# Patient Record
Sex: Male | Born: 1940 | Race: White | Hispanic: No | Marital: Married | Smoking: Never smoker
Health system: Southern US, Community
[De-identification: ages and names within clinical notes are randomized; demographics above are authoritative.]

## PROBLEM LIST (undated history)

## (undated) DIAGNOSIS — K219 Gastro-esophageal reflux disease without esophagitis: Secondary | ICD-10-CM

## (undated) DIAGNOSIS — M779 Enthesopathy, unspecified: Secondary | ICD-10-CM

## (undated) DIAGNOSIS — M1990 Unspecified osteoarthritis, unspecified site: Secondary | ICD-10-CM

## (undated) DIAGNOSIS — J90 Pleural effusion, not elsewhere classified: Secondary | ICD-10-CM

## (undated) DIAGNOSIS — I4892 Unspecified atrial flutter: Secondary | ICD-10-CM

## (undated) DIAGNOSIS — T8859XA Other complications of anesthesia, initial encounter: Secondary | ICD-10-CM

## (undated) DIAGNOSIS — C801 Malignant (primary) neoplasm, unspecified: Secondary | ICD-10-CM

## (undated) DIAGNOSIS — Z8679 Personal history of other diseases of the circulatory system: Secondary | ICD-10-CM

## (undated) DIAGNOSIS — C4490 Unspecified malignant neoplasm of skin, unspecified: Secondary | ICD-10-CM

## (undated) DIAGNOSIS — K5790 Diverticulosis of intestine, part unspecified, without perforation or abscess without bleeding: Secondary | ICD-10-CM

## (undated) DIAGNOSIS — I214 Non-ST elevation (NSTEMI) myocardial infarction: Secondary | ICD-10-CM

## (undated) DIAGNOSIS — I255 Ischemic cardiomyopathy: Secondary | ICD-10-CM

## (undated) DIAGNOSIS — K589 Irritable bowel syndrome without diarrhea: Secondary | ICD-10-CM

## (undated) DIAGNOSIS — K869 Disease of pancreas, unspecified: Secondary | ICD-10-CM

## (undated) DIAGNOSIS — Z789 Other specified health status: Secondary | ICD-10-CM

## (undated) DIAGNOSIS — E785 Hyperlipidemia, unspecified: Secondary | ICD-10-CM

## (undated) DIAGNOSIS — Z951 Presence of aortocoronary bypass graft: Secondary | ICD-10-CM

## (undated) DIAGNOSIS — N5089 Other specified disorders of the male genital organs: Secondary | ICD-10-CM

## (undated) DIAGNOSIS — I10 Essential (primary) hypertension: Secondary | ICD-10-CM

## (undated) DIAGNOSIS — I5020 Unspecified systolic (congestive) heart failure: Secondary | ICD-10-CM

## (undated) DIAGNOSIS — E871 Hypo-osmolality and hyponatremia: Secondary | ICD-10-CM

## (undated) DIAGNOSIS — I2510 Atherosclerotic heart disease of native coronary artery without angina pectoris: Secondary | ICD-10-CM

## (undated) HISTORY — PX: BACK SURGERY: SHX140

## (undated) HISTORY — DX: Hyperlipidemia, unspecified: E78.5

## (undated) HISTORY — DX: Enthesopathy, unspecified: M77.9

## (undated) HISTORY — PX: COLONOSCOPY: SHX174

## (undated) HISTORY — PX: HYDROCELE EXCISION: SHX482

## (undated) HISTORY — PX: SKIN CANCER EXCISION: SHX779

## (undated) HISTORY — PX: CATARACT EXTRACTION, BILATERAL: SHX1313

## (undated) HISTORY — DX: Essential (primary) hypertension: I10

## (undated) HISTORY — DX: Atherosclerotic heart disease of native coronary artery without angina pectoris: I25.10

## (undated) HISTORY — DX: Unspecified osteoarthritis, unspecified site: M19.90

## (undated) HISTORY — DX: Irritable bowel syndrome, unspecified: K58.9

## (undated) HISTORY — PX: BUNIONECTOMY: SHX129

## (undated) HISTORY — DX: Ischemic cardiomyopathy: I25.5

## (undated) MED FILL — Dexamethasone Sodium Phosphate Inj 100 MG/10ML: INTRAMUSCULAR | Qty: 1.5 | Status: AC

---

## 2002-02-16 ENCOUNTER — Encounter: Payer: Self-pay | Admitting: Internal Medicine

## 2002-12-21 HISTORY — PX: HEMORRHOID SURGERY: SHX153

## 2003-11-19 ENCOUNTER — Encounter: Payer: Self-pay | Admitting: Internal Medicine

## 2004-12-31 ENCOUNTER — Ambulatory Visit: Payer: Self-pay | Admitting: Internal Medicine

## 2005-05-07 ENCOUNTER — Ambulatory Visit: Payer: Self-pay | Admitting: Internal Medicine

## 2005-08-06 ENCOUNTER — Ambulatory Visit: Payer: Self-pay | Admitting: Internal Medicine

## 2006-02-04 ENCOUNTER — Ambulatory Visit: Payer: Self-pay | Admitting: Internal Medicine

## 2006-05-08 ENCOUNTER — Emergency Department: Payer: Self-pay | Admitting: Emergency Medicine

## 2006-08-02 ENCOUNTER — Ambulatory Visit: Payer: Self-pay | Admitting: Internal Medicine

## 2007-03-03 ENCOUNTER — Encounter: Payer: Self-pay | Admitting: Internal Medicine

## 2007-03-03 ENCOUNTER — Ambulatory Visit: Payer: Self-pay | Admitting: Internal Medicine

## 2007-03-03 DIAGNOSIS — I10 Essential (primary) hypertension: Secondary | ICD-10-CM | POA: Insufficient documentation

## 2007-03-03 DIAGNOSIS — E785 Hyperlipidemia, unspecified: Secondary | ICD-10-CM

## 2007-03-03 DIAGNOSIS — B001 Herpesviral vesicular dermatitis: Secondary | ICD-10-CM

## 2007-03-03 DIAGNOSIS — M1990 Unspecified osteoarthritis, unspecified site: Secondary | ICD-10-CM | POA: Insufficient documentation

## 2007-03-03 LAB — CONVERTED CEMR LAB
Calcium: 9 mg/dL (ref 8.4–10.5)
Chloride: 107 meq/L (ref 96–112)
GFR calc Af Amer: 125 mL/min
GFR calc non Af Amer: 103 mL/min
Glucose, Bld: 116 mg/dL — ABNORMAL HIGH (ref 70–99)
PSA: 0.36 ng/mL (ref 0.10–4.00)

## 2007-06-27 ENCOUNTER — Ambulatory Visit: Payer: Self-pay | Admitting: Internal Medicine

## 2007-06-27 DIAGNOSIS — N410 Acute prostatitis: Secondary | ICD-10-CM | POA: Insufficient documentation

## 2007-06-27 LAB — CONVERTED CEMR LAB
Glucose, Urine, Semiquant: NEGATIVE
Specific Gravity, Urine: <1.005
Urobilinogen, UA: 0.2
pH: 6

## 2007-08-25 ENCOUNTER — Ambulatory Visit: Payer: Self-pay | Admitting: Internal Medicine

## 2008-01-19 ENCOUNTER — Telehealth (INDEPENDENT_AMBULATORY_CARE_PROVIDER_SITE_OTHER): Payer: Self-pay | Admitting: *Deleted

## 2008-02-23 ENCOUNTER — Ambulatory Visit: Payer: Self-pay | Admitting: Internal Medicine

## 2008-02-28 LAB — CONVERTED CEMR LAB
BUN: 10 mg/dL (ref 6–23)
Basophils Relative: 0.3 % (ref 0.0–1.0)
CO2: 30 meq/L (ref 19–32)
Chloride: 104 meq/L (ref 96–112)
Creatinine, Ser: 0.8 mg/dL (ref 0.4–1.5)
Eosinophils Relative: 2.2 % (ref 0.0–5.0)
Hemoglobin: 14.8 g/dL (ref 13.0–17.0)
Lymphocytes Relative: 35 % (ref 12.0–46.0)
Monocytes Absolute: 0.4 10*3/uL (ref 0.2–0.7)
Neutro Abs: 2.8 10*3/uL (ref 1.4–7.7)
PSA: 0.41 ng/mL (ref 0.10–4.00)
Phosphorus: 3 mg/dL (ref 2.3–4.6)
RDW: 12.4 % (ref 11.5–14.6)
Sodium: 140 meq/L (ref 135–145)
TSH: 1.91 microintl units/mL (ref 0.35–5.50)
VLDL: 29 mg/dL (ref 0–40)
WBC: 5.1 10*3/uL (ref 4.5–10.5)

## 2008-03-27 ENCOUNTER — Telehealth (INDEPENDENT_AMBULATORY_CARE_PROVIDER_SITE_OTHER): Payer: Self-pay | Admitting: *Deleted

## 2008-09-14 ENCOUNTER — Ambulatory Visit: Payer: Self-pay | Admitting: Internal Medicine

## 2008-09-17 LAB — CONVERTED CEMR LAB
AST: 26 units/L (ref 0–37)
Albumin: 4.3 g/dL (ref 3.5–5.2)
Alkaline Phosphatase: 80 units/L (ref 39–117)
CO2: 29 meq/L (ref 19–32)
Calcium: 9 mg/dL (ref 8.4–10.5)
Cholesterol: 212 mg/dL (ref 0–200)
Creatinine, Ser: 0.8 mg/dL (ref 0.4–1.5)
Direct LDL: 159.3 mg/dL
GFR calc Af Amer: 124 mL/min
GFR calc non Af Amer: 103 mL/min
Phosphorus: 2.6 mg/dL (ref 2.3–4.6)
Sodium: 142 meq/L (ref 135–145)
Total CHOL/HDL Ratio: 7
Total Protein: 7.1 g/dL (ref 6.0–8.3)
Triglycerides: 95 mg/dL (ref 0–149)

## 2008-09-26 ENCOUNTER — Encounter: Payer: Self-pay | Admitting: Internal Medicine

## 2008-12-21 DIAGNOSIS — M779 Enthesopathy, unspecified: Secondary | ICD-10-CM

## 2008-12-21 HISTORY — DX: Enthesopathy, unspecified: M77.9

## 2009-02-18 ENCOUNTER — Encounter: Payer: Self-pay | Admitting: Gastroenterology

## 2009-02-18 ENCOUNTER — Encounter: Payer: Self-pay | Admitting: Internal Medicine

## 2009-02-18 ENCOUNTER — Encounter: Payer: Self-pay | Admitting: Family Medicine

## 2009-02-26 ENCOUNTER — Ambulatory Visit: Payer: Self-pay | Admitting: Family Medicine

## 2009-02-26 LAB — CONVERTED CEMR LAB
Bilirubin Urine: NEGATIVE
Blood in Urine, dipstick: NEGATIVE
Ketones, urine, test strip: NEGATIVE
Nitrite: NEGATIVE
Protein, U semiquant: NEGATIVE
Urobilinogen, UA: 0.2
WBC Urine, dipstick: NEGATIVE

## 2009-03-04 ENCOUNTER — Telehealth: Payer: Self-pay | Admitting: Family Medicine

## 2009-03-05 ENCOUNTER — Ambulatory Visit: Payer: Self-pay | Admitting: Internal Medicine

## 2009-03-06 LAB — CONVERTED CEMR LAB
ALT: 26 units/L (ref 0–53)
AST: 19 units/L (ref 0–37)
Basophils Relative: 0.5 % (ref 0.0–3.0)
Bilirubin, Direct: 0.1 mg/dL (ref 0.0–0.3)
Eosinophils Relative: 1.5 % (ref 0.0–5.0)
Glucose, Bld: 91 mg/dL (ref 70–99)
HCT: 42.8 % (ref 39.0–52.0)
Hemoglobin: 14.7 g/dL (ref 13.0–17.0)
Lipase: 23 units/L (ref 11.0–59.0)
Lymphs Abs: 1.9 10*3/uL (ref 0.7–4.0)
MCV: 90.8 fL (ref 78.0–100.0)
Monocytes Absolute: 0.6 10*3/uL (ref 0.1–1.0)
Monocytes Relative: 10.1 % (ref 3.0–12.0)
Platelets: 216 10*3/uL (ref 150.0–400.0)
RBC: 4.72 M/uL (ref 4.22–5.81)
Sodium: 140 meq/L (ref 135–145)
Total Bilirubin: 1.9 mg/dL — ABNORMAL HIGH (ref 0.3–1.2)
Total Protein: 7 g/dL (ref 6.0–8.3)
WBC: 5.8 10*3/uL (ref 4.5–10.5)

## 2009-03-07 ENCOUNTER — Ambulatory Visit: Payer: Self-pay | Admitting: Internal Medicine

## 2009-03-07 ENCOUNTER — Telehealth: Payer: Self-pay | Admitting: Internal Medicine

## 2009-03-08 ENCOUNTER — Telehealth: Payer: Self-pay | Admitting: Gastroenterology

## 2009-03-08 ENCOUNTER — Encounter: Payer: Self-pay | Admitting: Internal Medicine

## 2009-03-08 ENCOUNTER — Encounter: Payer: Self-pay | Admitting: Gastroenterology

## 2009-03-08 ENCOUNTER — Telehealth: Payer: Self-pay | Admitting: Internal Medicine

## 2009-03-08 ENCOUNTER — Ambulatory Visit: Payer: Self-pay | Admitting: Internal Medicine

## 2009-03-12 ENCOUNTER — Ambulatory Visit: Payer: Self-pay | Admitting: Gastroenterology

## 2009-04-17 ENCOUNTER — Ambulatory Visit: Payer: Self-pay | Admitting: Gastroenterology

## 2009-12-06 ENCOUNTER — Ambulatory Visit: Payer: Self-pay | Admitting: Gastroenterology

## 2009-12-06 DIAGNOSIS — K7689 Other specified diseases of liver: Secondary | ICD-10-CM

## 2009-12-06 DIAGNOSIS — K5730 Diverticulosis of large intestine without perforation or abscess without bleeding: Secondary | ICD-10-CM | POA: Insufficient documentation

## 2010-01-21 HISTORY — PX: INGUINAL HERNIA REPAIR: SHX194

## 2010-01-22 ENCOUNTER — Encounter: Payer: Self-pay | Admitting: Internal Medicine

## 2010-01-28 ENCOUNTER — Telehealth: Payer: Self-pay | Admitting: Internal Medicine

## 2010-01-30 ENCOUNTER — Ambulatory Visit (HOSPITAL_COMMUNITY): Admission: RE | Admit: 2010-01-30 | Discharge: 2010-01-30 | Payer: Self-pay | Admitting: Surgery

## 2010-06-11 ENCOUNTER — Encounter: Payer: Self-pay | Admitting: Internal Medicine

## 2010-06-20 HISTORY — PX: ROTATOR CUFF REPAIR: SHX139

## 2010-07-11 ENCOUNTER — Ambulatory Visit: Payer: Self-pay | Admitting: Internal Medicine

## 2010-07-11 ENCOUNTER — Telehealth: Payer: Self-pay | Admitting: Internal Medicine

## 2010-07-11 DIAGNOSIS — K589 Irritable bowel syndrome without diarrhea: Secondary | ICD-10-CM

## 2010-07-14 LAB — CONVERTED CEMR LAB
ALT: 21 units/L (ref 0–53)
AST: 19 units/L (ref 0–37)
Alkaline Phosphatase: 141 units/L — ABNORMAL HIGH (ref 39–117)
Basophils Absolute: 0 10*3/uL (ref 0.0–0.1)
Eosinophils Absolute: 0.1 10*3/uL (ref 0.0–0.7)
Eosinophils Relative: 1 % (ref 0–5)
HCT: 43.1 % (ref 39.0–52.0)
MCV: 86.5 fL (ref 78.0–100.0)
Neutrophils Relative %: 62 % (ref 43–77)
Platelets: 273 10*3/uL (ref 150–400)
Potassium: 4.4 meq/L (ref 3.5–5.3)
RDW: 12.9 % (ref 11.5–15.5)
Sodium: 138 meq/L (ref 135–145)
Total Bilirubin: 1 mg/dL (ref 0.3–1.2)
Total Protein: 7.9 g/dL (ref 6.0–8.3)

## 2010-12-10 ENCOUNTER — Telehealth: Payer: Self-pay | Admitting: Internal Medicine

## 2011-01-20 NOTE — Progress Notes (Signed)
Summary: requests refills on valtrex  Phone Note Refill Request Message from:  Pharmacy  Refills Requested: Medication #1:  VALACYCLOVIR HCL 1 GM TABS take 2 at onset of rash and repeat in 12 hours Pt is requesting refills, please [facility].  Initial call taken by: Lowella Petties CMA,  July 11, 2010 4:31 PM  Follow-up for Phone Call        okay #20 x 1 Follow-up by: Cindee Salt MD,  July 11, 2010 4:33 PM    Prescriptions: VALACYCLOVIR HCL 1 GM TABS (VALACYCLOVIR HCL) take 2 at onset of rash and repeat in 12 hours  #20 x 1   Entered by:   Janee Morn CMA   Authorized by:   Cindee Salt MD   Signed by:   Janee Morn CMA on 07/11/2010   Method used:   Electronically to        [facility] [address] [address]* (retail)       [address]       [place], [place]  [address]       Ph: [phone]       Fax: [phone]   RxID:   [id number]   Appended Document: requests refills on valtrex Faxed to [facility] in [place] as directed.

## 2011-01-20 NOTE — Letter (Signed)
Summary: Screening/[facility]   Imported By: Sherian Rein 09/06/2010 08:55:12  _____________________________________________________________________  External Attachment:    Type:   Image     Comment:   External Document

## 2011-01-20 NOTE — Progress Notes (Signed)
Summary: [name]  Phone Note Refill Request Message from:  [facility] [phone] on January 28, 2010 12:58 PM  Refills Requested: Medication #1:  NORVASC 5 MG  TABS Take 1/2 by mouth once a day or as directed   Last Refilled: 08/03/2009 pt would also like VALTREX 1 GM TABS (VALACYCLOVIR HCL) Take 2 tablet by mouth twice a day, not on active med list. Patient does not have a follow-up appt scheduled.   Method Requested: Electronic Initial call taken by: Mervin Hack CMA Duncan Dull),  January 28, 2010 1:01 PM  Follow-up for Phone Call        okay norvasc x 1 year  had valtrex in past Okay 1000mg    #28 x 0 2 at onset of rash and repeat in 12 hours  set up appt within the next 2-3 months for follow up Follow-up by: Cindee Salt MD,  January 28, 2010 1:45 PM  Additional Follow-up for Phone Call Additional follow up Details #1::        patient states he doesn't have money to keep going to doctors and he is NOT coming in. Patient was screaming, I told him we can't keep refilling medications if he doesn't come for his appst. Patient screamed again that he will just stop taking all medications and hung up the phone. Please advise DeShannon Katrinka Blazing CMA Duncan Dull)  January 28, 2010 2:53 PM    Please correct the Rx 3 months only on the norvasc keep the valtrex the same Additional Follow-up by: Cindee Salt MD,  January 28, 2010 3:01 PM    Additional Follow-up for Phone Call Additional follow up Details #2::    new [facility] to [facility]  Follow-up by: Mervin Hack CMA Duncan Dull),  January 28, 2010 3:09 PM  New/Updated Medications: VALACYCLOVIR HCL 1 GM TABS (VALACYCLOVIR HCL) take 2 at onset of rash and repeat in 12 hours Prescriptions: NORVASC 5 MG  TABS (AMLODIPINE BESYLATE) Take 1/2 by mouth once a day or as directed  #30 x 3   Entered by:   Mervin Hack CMA (AAMA)   Authorized by:   Cindee Salt MD   Signed by:   Mervin Hack CMA (AAMA) on 01/28/2010  Method used:   Electronically to        [facility] (retail)       [address]       [place], [place]  [address]       Ph: [phone]       Fax: [phone]   RxID:   [id number] VALACYCLOVIR HCL 1 GM TABS (VALACYCLOVIR HCL) take 2 at onset of rash and repeat in 12 hours  #28 x 0   Entered by:   Mervin Hack CMA (AAMA)   Authorized by:   Cindee Salt MD   Signed by:   Mervin Hack CMA (AAMA) on 01/28/2010   Method used:   Electronically to        [facility] (retail)       [address]       [place], [place]  [address]       Ph: [phone]       Fax: [phone]   RxID:   [id number] NORVASC 5 MG  TABS (AMLODIPINE BESYLATE) Take 1/2 by mouth once a day or as directed  #90 x 3  Entered by:   Mervin Hack CMA (AAMA)   Authorized by:   Cindee Salt MD   Signed by:   Mervin Hack CMA (AAMA) on 01/28/2010   Method used:   Electronically to        [facility] (retail)       [address]       [place], [place]  [address]       Ph: [phone]       Fax: [phone]   RxID:   [id number]

## 2011-01-20 NOTE — Assessment & Plan Note (Signed)
Summary: F/U,REFILL MEDS/CLE   Vital Signs:  Patient profile:   70 year old male Weight:      144.13 pounds Temp:     98.1 degrees F oral Pulse rate:   86 / minute Pulse rhythm:   regular BP sitting:   140 / 82  (right arm) Cuff size:   regular  Vitals Entered By: Janee Morn CMA (July 11, 2010 2:11 PM) CC: Refill meds   History of Present Illness: Had hernia fixed in February  Went in for bone spur repair on left shoulder Turned out having rotator cuff repair as well Still recovering from the procedure Going through PT---painful at time Has stayed busy  BP has been okay At home 120s/60s usually No chest pain No SOB  Still needs occ hyomax for abd cramps this works well  Recent blood work and vascular screening in [place]  he will bring in copy  Allergies: 1)  ! * Levaquinn 2)  Penicillin G Potassium (Penicillin G Potassium) 3)  * Zocor 4)  * Lipitor  Past History:  Past medical, surgical, family and social histories (including risk factors) reviewed for relevance to current acute and chronic problems.  Past Medical History: Hyperlipidemia Hypertension Osteoarthritis Irritable bowel syndrome  Past Surgical History: Back surgery  ~1977 Hemorrhoids 2004 Stress echo negative 03/03 Skin cancer-right shoulder 2003/5 Right shoulder bone spur  2010 [facility]  2/11     Dr Thalia Bloodgood Left rotator cuff/bone spur  7/11   Dr Magnus Ivan  Family History: Reviewed history from 08/10/2007 and no changes required. Father: Died at age 27? MI Mother: Alive Siblings: 3 Brothers living HTN and probably CAD are widespread in family No DM No colon or prostate cancer  Social History: Marital Status: Divorced. Has live-in since  ~2005 Children: 2 sons, no contact Occupation: Retired, Electrical engineer. extension, agent [place] Splits his time between [place] and [place] Never Smoked Alcohol use-rare Daily Caffeine Use  Review of Systems  The patient denies chest pain, syncope,  and dyspnea on exertion.         Hasn't been able to exercise due to surgery weight is stable though sleeps okay except for shoulder surgery  Physical Exam  General:  alert and normal appearance.   Neck:  supple, no masses, no thyromegaly, no carotid bruits, and no cervical lymphadenopathy.   Lungs:  normal respiratory effort and normal breath sounds.   Heart:  normal rate, regular rhythm, no murmur, and no gallop.   Extremities:  no edema Psych:  normally interactive, good eye contact, not anxious appearing, and not depressed appearing.     Impression & Recommendations:  Problem # 1:  HYPERTENSION (ICD-401.9) Assessment Unchanged  good control no changes needed  His updated medication list for this problem includes:    Norvasc 5 Mg Tabs (Amlodipine besylate) [facility]... Take 1/2 by mouth once a day or as directed  BP today: 140/82 Prior BP: 140/68 (12/06/2009)  Prior 10 Yr Risk Heart Disease: Not enough information (03/03/2007)  Labs Reviewed: K+: 4.7 (03/05/2009) Creat: : 0.7 (03/05/2009)   Chol: 212 (09/14/2008)   HDL: 30.5 (09/14/2008)   LDL: DEL (09/14/2008)   TG: 95 (09/14/2008)  Orders: Venipuncture ([address]) Specimen Handling ([address]) T-Comprehensive Metabolic Panel ([address]11-91478) T-CBC w/Diff (29562-13086) T-TSH (57846-96295)  Problem # 2:  OSTEOARTHRITIS (ICD-715.90) Assessment: Comment Only recovering from recent shoulder surgery using hydrocodone as needed now  Problem # 3:  IRRITABLE BOWEL SYNDROME (ICD-564.1) Assessment: Comment Only does well with occ hyoscyamine  Problem #  4:  HYPERLIPIDEMIA (ICD-272.4) Assessment: Unchanged recent reasonable results he will bring me a copy  Labs Reviewed: SGOT: 19 (03/05/2009)   SGPT: 26 (03/05/2009)  Prior 10 Yr Risk Heart Disease: Not enough information (03/03/2007)   HDL:30.5 (09/14/2008), 24.6 (02/23/2008)  LDL:DEL (09/14/2008), DEL (02/23/2008)  Chol:212 (09/14/2008), 245 (02/23/2008)  Trig:95 (09/14/2008), 145  (02/23/2008)  Complete Medication List: 1)  Norvasc 5 Mg Tabs (Amlodipine besylate) .... Take 1/2 by mouth once a day or as directed 2)  Levsin 0.125 Mg Tabs (Hyoscyamine sulfate) .... Take one by mouth two times a day, as needed 3)  Valacyclovir Hcl 1 Gm Tabs (Valacyclovir hcl) .... Take 2 at onset of rash and repeat in 12 hours 4)  Hyoscyamine Sulfate 0.125 Mg Subl (Hyoscyamine sulfate) .... Dissolve one under tongue as needed for stomach cramping  Other Orders: T-PSA (21308-65784) Tdap => 92yrs IM (69629) Pneumococcal Vaccine (52841) Admin 1st Vaccine (32440) Admin of Any Addtl Vaccine ([address]272)  Patient Instructions: 1)  Please schedule a follow-up appointment in 1 year.  Prescriptions: HYOSCYAMINE SULFATE 0.125 MG SUBL (HYOSCYAMINE SULFATE) Dissolve one under tongue as needed for stomach cramping  #60 x 5   Entered and Authorized by:   Cindee Salt MD   Signed by:   Cindee Salt MD on 07/11/2010   Method used:   Electronically to        [facility] [address] [address]* (retail)       [address]       [place], [place]  [address]       Ph: [phone]       Fax: [phone]   RxID:   [id number]   Current Allergies (reviewed today): ! * LEVAQUINN PENICILLIN G POTASSIUM (PENICILLIN G POTASSIUM) * ZOCOR * LIPITOR   Immunizations Administered:  Tetanus Vaccine:    Vaccine Type: Tdap    Site: left deltoid    Mfr: GlaxoSmithKline    Dose: 0.5 ml    Route: IM    Given by: Janee Morn CMA    Exp. Date: 03/14/2012    Lot #: [id number]    VIS given: 11/08/07 version given July 11, 2010.  Pneumonia Vaccine:    Vaccine Type: Pneumovax    Site: right deltoid    Mfr: Merck    Dose: 0.5 ml    Route: IM    Given by: Janee Morn CMA    Exp. Date: 12/20/2011    Lot #: [id number]    VIS given: 07/18/96 version given July 11, 2010.

## 2011-01-20 NOTE — Consult Note (Signed)
Summary: [facility]   Imported By: Lanelle Bal 02/08/2010 11:31:57  _____________________________________________________________________  External Attachment:    Type:   Image     Comment:   External Document

## 2011-01-22 NOTE — Progress Notes (Signed)
Summary: refill request for valtrex  Phone Note Refill Request Message from:  Fax from Pharmacy  Refills Requested: Medication #1:  VALACYCLOVIR HCL 1 GM TABS take 2 at onset of rash and repeat in 12 hours   Last Refilled: 09/12/2010 Faxed request from [facility] is on your desk.  Initial call taken by: Lowella Petties CMA, AAMA,  December 10, 2010 12:47 PM  Follow-up for Phone Call        Rx completed in Dr. Tiajuana Amass Follow-up by: Cindee Salt MD,  December 10, 2010 1:21 PM    New/Updated Medications: VALACYCLOVIR HCL 1 GM TABS (VALACYCLOVIR HCL) take 2 at onset of rash and repeat in 12 hours Prescriptions: VALACYCLOVIR HCL 1 GM TABS (VALACYCLOVIR HCL) take 2 at onset of rash and repeat in 12 hours  #20 x 1   Entered and Authorized by:   Cindee Salt MD   Signed by:   Cindee Salt MD on 12/10/2010   Method used:   Electronically to        [facility] [address] [address]* (retail)       [address]       [place], [place]  [address]       Ph: [phone]       Fax: [phone]   RxID:   [id number]

## 2011-05-14 ENCOUNTER — Telehealth: Payer: Self-pay | Admitting: *Deleted

## 2011-05-14 NOTE — Telephone Encounter (Signed)
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  He will also need vaccinations.

## 2011-05-14 NOTE — Telephone Encounter (Signed)
Form signed No charge  He is due for Tdap and should get 1 more Hep B and Hep A to finish series (can use combined vaccines if we have) Otherwise up to date unless he thinks he is at risk for rabies (would have to get through health department) He should check CDC website for his travel region---if he needs malaria preventative and which, I can prescribe if needed

## 2011-05-19 NOTE — Telephone Encounter (Signed)
Spoke with patient and advised results, pt will call for nurse visit for Twinrix and tdap, also pt wanted to know if he can get a rx for Cipro?

## 2011-05-20 ENCOUNTER — Ambulatory Visit (INDEPENDENT_AMBULATORY_CARE_PROVIDER_SITE_OTHER): Payer: Medicare Other | Admitting: Internal Medicine

## 2011-05-20 DIAGNOSIS — Z23 Encounter for immunization: Secondary | ICD-10-CM

## 2011-05-20 NOTE — Telephone Encounter (Signed)
Patient coming in today for nurse visit for immunizations.

## 2011-05-20 NOTE — Progress Notes (Signed)
  Subjective:    Patient ID: [name], male    DOB: 1940-12-26, 70 y.o.   MRN: [id number]  HPI  Here to update immunizations before travelling out of country Review of Systems     Objective:   Physical Exam        Assessment & Plan:

## 2011-05-20 NOTE — Telephone Encounter (Signed)
Antibiotics are not recommended for diarrhea except in rare cases Find out what his concerns are

## 2011-07-09 ENCOUNTER — Ambulatory Visit (INDEPENDENT_AMBULATORY_CARE_PROVIDER_SITE_OTHER): Payer: Medicare Other | Admitting: Family Medicine

## 2011-07-09 DIAGNOSIS — Z23 Encounter for immunization: Secondary | ICD-10-CM

## 2011-07-10 NOTE — Progress Notes (Signed)
Hep A/ Hep B

## 2011-12-05 ENCOUNTER — Other Ambulatory Visit: Payer: Self-pay | Admitting: Internal Medicine

## 2011-12-07 NOTE — Telephone Encounter (Signed)
Okay #20 x 0 of valacyclovir And #60 x 0 of hyoscyamine  Please check his upcoming appt Is it with me?? It states for nurse He is due for physical

## 2011-12-07 NOTE — Telephone Encounter (Signed)
Patient last seen 07/11/10 was told to follow-up in 1 year, ok to fill?

## 2011-12-08 ENCOUNTER — Ambulatory Visit (INDEPENDENT_AMBULATORY_CARE_PROVIDER_SITE_OTHER): Payer: No Typology Code available for payment source | Admitting: *Deleted

## 2011-12-08 DIAGNOSIS — Z23 Encounter for immunization: Secondary | ICD-10-CM

## 2011-12-08 NOTE — Telephone Encounter (Signed)
Spoke with patient and advised results, he will call back in April for an appointment.

## 2011-12-08 NOTE — Telephone Encounter (Signed)
rx sent to pharmacy by e-script Spoke with patient and he states he's leaving for [place] on Thursday, pt states he's scheduled appointments and they've been canceled by the office ( I don't see any). I advised he's been coming in for nurse visits that he could've scheduled appointments then, he stated Dr.Letvak is never available. He won't be back until April 2013 per pt.

## 2011-12-08 NOTE — Telephone Encounter (Signed)
That is totally not true Not is not an acceptable response and I will not do anymore refills till he is seen in the office

## 2011-12-11 ENCOUNTER — Ambulatory Visit: Payer: Medicare Other

## 2012-04-05 ENCOUNTER — Encounter: Payer: Self-pay | Admitting: Internal Medicine

## 2012-04-05 ENCOUNTER — Ambulatory Visit (INDEPENDENT_AMBULATORY_CARE_PROVIDER_SITE_OTHER): Payer: No Typology Code available for payment source | Admitting: Internal Medicine

## 2012-04-05 VITALS — BP 132/68 | HR 72 | Temp 98.5°F | Ht 66.0 in | Wt 143.0 lb

## 2012-04-05 DIAGNOSIS — B009 Herpesviral infection, unspecified: Secondary | ICD-10-CM

## 2012-04-05 DIAGNOSIS — E785 Hyperlipidemia, unspecified: Secondary | ICD-10-CM

## 2012-04-05 DIAGNOSIS — I10 Essential (primary) hypertension: Secondary | ICD-10-CM

## 2012-04-05 DIAGNOSIS — Z0000 Encounter for general adult medical examination without abnormal findings: Secondary | ICD-10-CM | POA: Insufficient documentation

## 2012-04-05 LAB — CBC WITH DIFFERENTIAL/PLATELET
Basophils Relative: 0.4 % (ref 0.0–3.0)
Eosinophils Absolute: 0.1 10*3/uL (ref 0.0–0.7)
HCT: 44.2 % (ref 39.0–52.0)
Hemoglobin: 15.2 g/dL (ref 13.0–17.0)
Lymphocytes Relative: 32.1 % (ref 12.0–46.0)
Lymphs Abs: 2.2 10*3/uL (ref 0.7–4.0)
MCHC: 34.3 g/dL (ref 30.0–36.0)
MCV: 88.8 fl (ref 78.0–100.0)
Monocytes Absolute: 0.6 10*3/uL (ref 0.1–1.0)
Neutro Abs: 4 10*3/uL (ref 1.4–7.7)
RBC: 4.98 Mil/uL (ref 4.22–5.81)
RDW: 13.5 % (ref 11.5–14.6)

## 2012-04-05 LAB — BASIC METABOLIC PANEL
CO2: 28 mEq/L (ref 19–32)
Chloride: 103 mEq/L (ref 96–112)
Glucose, Bld: 96 mg/dL (ref 70–99)
Potassium: 4.2 mEq/L (ref 3.5–5.1)
Sodium: 140 mEq/L (ref 135–145)

## 2012-04-05 LAB — HEPATIC FUNCTION PANEL
ALT: 24 U/L (ref 0–53)
Albumin: 4.6 g/dL (ref 3.5–5.2)
Bilirubin, Direct: 0 mg/dL (ref 0.0–0.3)
Total Protein: 7.8 g/dL (ref 6.0–8.3)

## 2012-04-05 LAB — LDL CHOLESTEROL, DIRECT: Direct LDL: 188.8 mg/dL

## 2012-04-05 LAB — LIPID PANEL: Total CHOL/HDL Ratio: 9

## 2012-04-05 MED ORDER — HYOSCYAMINE SULFATE 0.125 MG SL SUBL: 0.1250 mg | SUBLINGUAL_TABLET | Freq: Two times a day (BID) | SUBLINGUAL | Status: DC | PRN

## 2012-04-05 MED ORDER — AMLODIPINE BESYLATE 5 MG PO TABS: 2.5000 mg | ORAL_TABLET | Freq: Every day | ORAL | Status: DC

## 2012-04-05 MED ORDER — VALACYCLOVIR HCL 1 G PO TABS: ORAL_TABLET | ORAL | Status: DC

## 2012-04-05 NOTE — Assessment & Plan Note (Signed)
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

## 2012-04-05 NOTE — Assessment & Plan Note (Signed)
Healthy Stays fit No more PSAs Not due for any other preventative care now

## 2012-04-05 NOTE — Assessment & Plan Note (Signed)
No Rx  will recheck

## 2012-04-05 NOTE — Progress Notes (Signed)
Subjective:    Patient ID: [name], male    DOB: 20-Sep-1941, 71 y.o.   MRN: [id number]  HPI Here for physical Feels well No new concerns UTD on colon and imms Discussed PSA for screening ---will stop now  Stays active, travels  Current Outpatient Prescriptions on File Prior to Visit  Medication Sig Dispense Refill  . amLODipine (NORVASC) 5 MG tablet Take 0.5 tablets (2.5 mg total) by mouth daily.  90 tablet  3  . hyoscyamine (LEVSIN SL) 0.125 MG SL tablet DISSOLVE ONE TABLET UNDER TONGUE AS NEEDED FOR STOMACH CRAMPING  60 tablet  0  . valACYclovir (VALTREX) 1000 MG tablet TAKE 2 AT ONSET OF RASH AND REPEAT IN 12 HOURS  20 tablet  0    Allergies  Allergen Reactions  . Atorvastatin     REACTION: aching  . Penicillins     REACTION: unspecified  . Simvastatin     REACTION: achinh    Past Medical History  Diagnosis Date  . Hyperlipidemia   . Hypertension   . Arthritis   . IBS (irritable bowel syndrome)   . Bone spur 2010    right shoulder    Past Surgical History  Procedure Date  . Back surgery   . Hemorrhoid surgery 2004  . Skin cancer excision 2003-2005    right shoulder  . Inguinal hernia repair 02/11  . Rotator cuff repair 07/11    left/ bone spur Dr.Blackman    Family History  Problem Relation Age of Onset  . Cancer Neg Hx   . Diabetes Neg Hx     History   Social History  . Marital Status: Single    Spouse Name: N/A    Number of Children: 2  . Years of Education: N/A   Occupational History  . retired Research scientist (medical)    Social History Main Topics  . Smoking status: Never Smoker   . Smokeless tobacco: Never Used  . Alcohol Use: Yes  . Drug Use: No  . Sexually Active: Not on file   Other Topics Concern  . Not on file   Social History Narrative   2 sons, no contactSlits his time between [place] live-in since 2005Has living willRequests Bernice--girlfriend or brother Molly Maduro, as health care POAWould  accept resuscitation but no prolonged artificial ventilationWouldn't want tube feeds   Review of Systems  Constitutional: Negative for fatigue and unexpected weight change.       Wears seat belt  HENT: Positive for hearing loss, rhinorrhea and tinnitus. Negative for congestion and dental problem.        Has hearing loss---decided against hearing aides Mild nasal allergies--no meds Regular with dentist  Eyes: Negative for visual disturbance.       No diplopia or unilateral vision loss  Respiratory: Negative for cough, chest tightness and shortness of breath.   Cardiovascular: Negative for chest pain, palpitations and leg swelling.  Gastrointestinal: Negative for nausea, vomiting, abdominal pain, constipation and blood in stool.       Careful with eating so now diverticulitis flares Uses the hyoscamine at times and it helps No sig heartburn--only if real spicy food  Genitourinary: Positive for urgency. Negative for frequency and difficulty urinating.       Some urgency after a lot of coffee No sexual problems  Uses the valtrex for flares---usually 2-3 times per year  Musculoskeletal: Positive for arthralgias. Negative for back pain and joint swelling.  Mild hand pain if overdoes it--tylenol helps that  Skin: Negative for rash.       No suspicious areas  Neurological: Positive for headaches. Negative for dizziness, syncope, weakness, light-headedness and numbness.       Rare headaches---tylenol helps  Hematological: Negative for adenopathy. Does not bruise/bleed easily.  Psychiatric/Behavioral: Negative for sleep disturbance and dysphoric mood. The patient is not nervous/anxious.        Objective:   Physical Exam  Constitutional: He is oriented to person, place, and time. He appears well-developed and well-nourished. No distress.  HENT:  Head: Normocephalic and atraumatic.  Right Ear: External ear normal.  Left Ear: External ear normal.  Mouth/Throat: Oropharynx is clear and  moist. No oropharyngeal exudate.  Eyes: Conjunctivae and EOM are normal. Pupils are equal, round, and reactive to light.  Neck: Normal range of motion. Neck supple. No thyromegaly present.  Cardiovascular: Normal rate, regular rhythm, normal heart sounds and intact distal pulses.  Exam reveals no gallop.   No murmur heard. Pulmonary/Chest: Effort normal and breath sounds normal. No respiratory distress. He has no wheezes. He has no rales.  Abdominal: Soft. There is no tenderness.  Musculoskeletal: He exhibits no edema and no tenderness.  Lymphadenopathy:    He has no cervical adenopathy.  Neurological: He is alert and oriented to person, place, and time.  Skin: No rash noted. No erythema.  Psychiatric: He has a normal mood and affect. His behavior is normal. Thought content normal.          Assessment & Plan:

## 2012-04-05 NOTE — Assessment & Plan Note (Signed)
BP Readings from Last 3 Encounters:  04/05/12 132/68  07/11/10 140/82  12/06/09 140/68   Good control No changes  Check labs

## 2012-04-06 ENCOUNTER — Encounter: Payer: Self-pay | Admitting: *Deleted

## 2012-12-12 ENCOUNTER — Other Ambulatory Visit: Payer: Self-pay | Admitting: Internal Medicine

## 2013-05-08 ENCOUNTER — Other Ambulatory Visit: Payer: Self-pay | Admitting: Internal Medicine

## 2013-07-17 ENCOUNTER — Other Ambulatory Visit: Payer: Self-pay | Admitting: *Deleted

## 2013-07-17 MED ORDER — HYOSCYAMINE SULFATE 0.125 MG SL SUBL: 0.1250 mg | SUBLINGUAL_TABLET | Freq: Two times a day (BID) | SUBLINGUAL | Status: DC | PRN

## 2013-07-17 MED ORDER — VALACYCLOVIR HCL 1 G PO TABS: 1000.0000 mg | ORAL_TABLET | Freq: Two times a day (BID) | ORAL | Status: DC

## 2013-09-11 ENCOUNTER — Other Ambulatory Visit: Payer: Self-pay | Admitting: Internal Medicine

## 2013-09-11 NOTE — Telephone Encounter (Signed)
Pt last seen 4/136/2013, ok to fill? No future appts scheduled

## 2013-09-11 NOTE — Telephone Encounter (Signed)
Wrong number will deny refill and ask pt to call the office

## 2013-09-11 NOTE — Telephone Encounter (Signed)
#  30 x 0 for amlodipine #20 x 0 for valacyclovir If he sets up an appt within the next 2 months

## 2014-05-18 ENCOUNTER — Encounter: Payer: Self-pay | Admitting: Internal Medicine

## 2014-05-18 ENCOUNTER — Ambulatory Visit (INDEPENDENT_AMBULATORY_CARE_PROVIDER_SITE_OTHER): Payer: Medicare HMO | Admitting: Internal Medicine

## 2014-05-18 VITALS — BP 134/78 | HR 98 | Temp 98.2°F | Wt 142.0 lb

## 2014-05-18 DIAGNOSIS — K589 Irritable bowel syndrome without diarrhea: Secondary | ICD-10-CM

## 2014-05-18 DIAGNOSIS — B009 Herpesviral infection, unspecified: Secondary | ICD-10-CM

## 2014-05-18 DIAGNOSIS — E785 Hyperlipidemia, unspecified: Secondary | ICD-10-CM

## 2014-05-18 DIAGNOSIS — I10 Essential (primary) hypertension: Secondary | ICD-10-CM

## 2014-05-18 DIAGNOSIS — Z23 Encounter for immunization: Secondary | ICD-10-CM

## 2014-05-18 DIAGNOSIS — B001 Herpesviral vesicular dermatitis: Secondary | ICD-10-CM

## 2014-05-18 LAB — COMPREHENSIVE METABOLIC PANEL
ALBUMIN: 4.1 g/dL (ref 3.5–5.2)
ALT: 18 U/L (ref 0–53)
AST: 20 U/L (ref 0–37)
Alkaline Phosphatase: 88 U/L (ref 39–117)
BUN: 8 mg/dL (ref 6–23)
CALCIUM: 9.4 mg/dL (ref 8.4–10.5)
CHLORIDE: 102 meq/L (ref 96–112)
CO2: 30 meq/L (ref 19–32)
Creatinine, Ser: 0.9 mg/dL (ref 0.4–1.5)
GFR: 92.74 mL/min (ref >60.00)
GLUCOSE: 120 mg/dL — AB (ref 70–99)
POTASSIUM: 4.8 meq/L (ref 3.5–5.1)
Sodium: 138 mEq/L (ref 135–145)
TOTAL PROTEIN: 7.1 g/dL (ref 6.0–8.3)
Total Bilirubin: 1.2 mg/dL (ref 0.2–1.2)

## 2014-05-18 LAB — CBC WITH DIFFERENTIAL/PLATELET
BASOS PCT: 0.3 % (ref 0.0–3.0)
Basophils Absolute: 0 10*3/uL (ref 0.0–0.1)
EOS ABS: 0.1 10*3/uL (ref 0.0–0.7)
EOS PCT: 2 % (ref 0.0–5.0)
HCT: 42.4 % (ref 39.0–52.0)
Hemoglobin: 14.4 g/dL (ref 13.0–17.0)
LYMPHS PCT: 25.3 % (ref 12.0–46.0)
Lymphs Abs: 1.6 10*3/uL (ref 0.7–4.0)
MCHC: 34 g/dL (ref 30.0–36.0)
MCV: 87.3 fl (ref 78.0–100.0)
Monocytes Absolute: 0.5 10*3/uL (ref 0.1–1.0)
Monocytes Relative: 7.7 % (ref 3.0–12.0)
NEUTROS PCT: 64.7 % (ref 43.0–77.0)
Neutro Abs: 4 10*3/uL (ref 1.4–7.7)
PLATELETS: 216 10*3/uL (ref 150.0–400.0)
RBC: 4.85 Mil/uL (ref 4.22–5.81)
RDW: 13.6 % (ref 11.5–15.5)
WBC: 6.3 10*3/uL (ref 4.0–10.5)

## 2014-05-18 LAB — T4, FREE: FREE T4: 0.91 ng/dL (ref 0.60–1.60)

## 2014-05-18 LAB — TSH: TSH: 1.22 u[IU]/mL (ref 0.35–4.50)

## 2014-05-18 MED ORDER — AMLODIPINE BESYLATE 5 MG PO TABS: 2.5000 mg | ORAL_TABLET | Freq: Every day | ORAL | Status: DC

## 2014-05-18 MED ORDER — VALACYCLOVIR HCL 1 G PO TABS: 1000.0000 mg | ORAL_TABLET | Freq: Two times a day (BID) | ORAL | Status: DC

## 2014-05-18 MED ORDER — HYOSCYAMINE SULFATE 0.125 MG SL SUBL: 0.1250 mg | SUBLINGUAL_TABLET | Freq: Two times a day (BID) | SUBLINGUAL | Status: DC | PRN

## 2014-05-18 NOTE — Assessment & Plan Note (Signed)
Mostly controlled with diet

## 2014-05-18 NOTE — Addendum Note (Signed)
Addended by: Despina Hidden on: 05/18/2014 10:45 AM   Modules accepted: Orders

## 2014-05-18 NOTE — Addendum Note (Signed)
Addended by: Despina Hidden on: 05/18/2014 12:57 PM   Modules accepted: Orders

## 2014-05-18 NOTE — Progress Notes (Signed)
Subjective:    Patient ID: [name], male    DOB: [birth year], 73 y.o.   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 amLODipine (NORVASC) 5 MG tablet Take 0.5 tablets (2.5 mg total) by mouth daily.  90 tablet  3  . hyoscyamine (LEVSIN SL) 0.125 MG SL tablet Place 1 tablet (0.125 mg total) under the tongue 2 (two) times daily as needed for cramping.  60 tablet  3  . valACYclovir (VALTREX) 1000 MG tablet Take 1 tablet (1,000 mg total) by mouth 2 (two) times daily.  20 tablet  0   No current facility-administered medications on file prior to visit.    Allergies  Allergen Reactions  . Atorvastatin     REACTION: aching  . Penicillins     REACTION: unspecified  . Simvastatin     REACTION: achinh    Past Medical History  Diagnosis Date  . Hyperlipidemia   . Hypertension   . Arthritis   . IBS (irritable bowel syndrome)   . Bone spur 2010    right shoulder    Past Surgical History  Procedure Laterality Date  . Back surgery    . Hemorrhoid surgery  2004  . Skin cancer excision  2003-2005    right shoulder  . Inguinal hernia repair  02/11  . Rotator cuff repair  07/11    left/ bone spur Dr.Blackman    Family History  Problem Relation Age of Onset  . Cancer Neg Hx   . Diabetes Neg Hx     History   Social History  . Marital Status: Single    Spouse Name: N/A    Number of Children: 2  . Years of Education: N/A   Occupational History   . retired Microbiologist    Social History Main Topics  . Smoking status: Never Smoker   . Smokeless tobacco: Never Used  . Alcohol Use: Yes  . Drug Use: No  . Sexual Activity: Not on file   Other Topics Concern  . Not on file   Social History Narrative   2 sons, no contact   Slits his time between [place] and [place]   Has live-in since 2005   Has living will   Requests [name]--girlfriend or brother [name], as health care POA   Would accept resuscitation but no prolonged artificial ventilation   Wouldn't want tube feeds   Review of Systems Sleeps well Appetite is fine Weight is stable Voids okay--some urgency. Nocturia x 1 usually. Still doesn't want to be on any statins--didn't tolerate    Objective:   Physical Exam  Constitutional: He appears well-developed and well-nourished. No distress.  Neck: Normal range of motion. Neck supple. No thyromegaly present.  Cardiovascular: Normal rate, regular rhythm, normal heart sounds and intact distal pulses.  Exam reveals no gallop.   No murmur heard. Pulmonary/Chest: Effort normal and breath sounds normal. No respiratory  distress. He has no wheezes. He has no rales.  Musculoskeletal: He exhibits no edema and no tenderness.  Lymphadenopathy:    He has no cervical adenopathy.  Psychiatric: He has a normal mood and affect. His behavior is normal.          Assessment & Plan:

## 2014-05-18 NOTE — Progress Notes (Signed)
Pre visit review using our clinic review tool, if applicable. No additional management support is needed unless otherwise documented below in the visit note. 

## 2014-05-18 NOTE — Assessment & Plan Note (Signed)
Discussed primary prevention---absolutely will not try other meds

## 2014-05-18 NOTE — Assessment & Plan Note (Signed)
Good response from the valtrex

## 2014-05-18 NOTE — Assessment & Plan Note (Addendum)
BP Readings from Last 3 Encounters:  05/18/14 134/78  04/05/12 132/68  07/11/10 140/82   Good control Some white coat component ---but did come down after a while Not strictly fasting---slightly elevated glucose can be normal

## 2014-05-19 ENCOUNTER — Telehealth: Payer: Self-pay | Admitting: Internal Medicine

## 2014-05-19 NOTE — Telephone Encounter (Signed)
Relevant patient education mailed to patient.  

## 2014-05-22 ENCOUNTER — Telehealth: Payer: Self-pay | Admitting: *Deleted

## 2014-05-22 ENCOUNTER — Encounter: Payer: Self-pay | Admitting: *Deleted

## 2014-05-22 NOTE — Telephone Encounter (Signed)
Faxed request for prior auth of HYOSCYAMINE 0.125 tab, ok to proceed?

## 2014-05-23 NOTE — Telephone Encounter (Signed)
Yes Don't know why it would need prior auth ---unless it is due to age

## 2014-05-28 ENCOUNTER — Encounter: Payer: Self-pay | Admitting: Podiatry

## 2014-05-30 NOTE — Telephone Encounter (Signed)
Pt's number is wrong number, called wife's number and left message   Need to speak to patient about how and if he still takes this medication.

## 2014-06-04 ENCOUNTER — Ambulatory Visit (INDEPENDENT_AMBULATORY_CARE_PROVIDER_SITE_OTHER): Payer: Medicare HMO

## 2014-06-04 ENCOUNTER — Ambulatory Visit (INDEPENDENT_AMBULATORY_CARE_PROVIDER_SITE_OTHER): Payer: Medicare HMO | Admitting: Podiatry

## 2014-06-04 ENCOUNTER — Encounter: Payer: Self-pay | Admitting: Podiatry

## 2014-06-04 VITALS — BP 149/70 | HR 71 | Resp 16

## 2014-06-04 DIAGNOSIS — IMO0002 Reserved for concepts with insufficient information to code with codable children: Secondary | ICD-10-CM

## 2014-06-04 DIAGNOSIS — T148XXA Other injury of unspecified body region, initial encounter: Secondary | ICD-10-CM

## 2014-06-04 DIAGNOSIS — M21619 Bunion of unspecified foot: Secondary | ICD-10-CM

## 2014-06-04 NOTE — Progress Notes (Signed)
   Subjective:    Patient ID: [name], male    DOB: 03-Sep-1941, 73 y.o.   MRN: [id number]  HPI Comments: i have a painful bunion on my left foot. Its gotten worse over 3 - 4 yrs. It hurts to walk. i put a rubber cushion on top of it.   Foot Pain      Review of Systems  HENT: Positive for hearing loss.        Ringing in ears  All other systems reviewed and are negative.      Objective:   Physical Exam: I have reviewed his past medical history medications allergies surgeries social history and review of systems. Pulses are strongly palpable bilateral. Neurologic sensorium is intact per since once the monofilament. Deep tendon reflexes are intact bilateral and muscle strength is 5 over 5 dorsiflexors plantar flexors inverters everters all intrinsic musculature is intact. Orthopedic evaluation demonstrates pain on palpation to the tibial sesamoid of the left foot. He also has a reactive or hypertrophic bone growth to the medial aspect of the first metatarsal left foot with mild hallux abductovalgus deformity. This is confirmed by radiographic evaluation which does demonstrate an increase in the first intermetatarsal angle greater than normal values and a fractured tibial sesamoid. This fractured sesamoid is non-comminuted and nondisplaced however it does appear to be relatively fresh and is definitely not a bipartite sesamoid.        Assessment & Plan:  Assessment: Fractured tibial sesamoid left foot. Hallux abductovalgus deformity left foot.  Plan: Due to the chronic pain of the hallux valgus disorder and the fracture tibial sesamoid left. We went over surgical consent form today line bylined number by number giving her ample time to ask questions he saw fit regarding [name] with screw left and a tibial sesamoidectomy. I did discuss all the possible postop complications which [name] include but are not limited to postop pain bleeding swelling infection recurrence and need  for further surgery I also explained to him the possibility of developing and mallet toe in the future he understands that is amenable to it signed all 3 pages of the form. He was dispensed a Cam Walker and I will followup with him in the near future for surgery.

## 2014-06-14 ENCOUNTER — Other Ambulatory Visit: Payer: Self-pay | Admitting: Podiatry

## 2014-06-14 MED ORDER — PROMETHAZINE HCL 25 MG PO TABS: 25.0000 mg | ORAL_TABLET | Freq: Three times a day (TID) | ORAL | Status: DC | PRN

## 2014-06-14 MED ORDER — OXYCODONE-ACETAMINOPHEN 10-325 MG PO TABS: 1.0000 | ORAL_TABLET | Freq: Four times a day (QID) | ORAL | Status: DC | PRN

## 2014-06-14 MED ORDER — CLINDAMYCIN HCL 150 MG PO CAPS: 150.0000 mg | ORAL_CAPSULE | Freq: Three times a day (TID) | ORAL | Status: DC

## 2014-06-15 ENCOUNTER — Encounter: Payer: Self-pay | Admitting: Podiatry

## 2014-06-15 DIAGNOSIS — M21619 Bunion of unspecified foot: Secondary | ICD-10-CM

## 2014-06-15 DIAGNOSIS — T148XXA Other injury of unspecified body region, initial encounter: Secondary | ICD-10-CM

## 2014-06-18 ENCOUNTER — Telehealth: Payer: Self-pay | Admitting: *Deleted

## 2014-06-18 NOTE — Progress Notes (Signed)
1. EXCISION TIBIAL SESAMOID LEFT 2. [place] BUNION REPAIR WITH SCREW LEFT  DOS 6.26.15

## 2014-06-18 NOTE — Telephone Encounter (Signed)
CALLED AND SPOKE WITH PT. SAID HE WAS DOING GOOD AFTER HIS SURGERY ON 6.26.15. SAYS HE IS STAYING OFF OF IT, ELEVATING, APPLY ICE AND TAKING RX AS DIRECTED. VERIFIED PTS NEXT FOLLOW UP APPT. PT UNDERSTOOD.

## 2014-06-19 ENCOUNTER — Telehealth: Payer: Self-pay

## 2014-06-19 NOTE — Telephone Encounter (Signed)
Spoke with pt regarding post operative status. He states that he has been doing well and managing his pain effectively. Advised to ice and elevate, remain in boot and keep sterile dressing dry until his appt

## 2014-06-21 ENCOUNTER — Ambulatory Visit (INDEPENDENT_AMBULATORY_CARE_PROVIDER_SITE_OTHER): Payer: Medicare HMO

## 2014-06-21 ENCOUNTER — Ambulatory Visit (INDEPENDENT_AMBULATORY_CARE_PROVIDER_SITE_OTHER): Payer: Medicare HMO | Admitting: Podiatry

## 2014-06-21 VITALS — BP 160/75 | HR 68 | Temp 96.2°F | Resp 16

## 2014-06-21 DIAGNOSIS — M21619 Bunion of unspecified foot: Secondary | ICD-10-CM

## 2014-06-21 DIAGNOSIS — M21612 Bunion of left foot: Secondary | ICD-10-CM

## 2014-06-21 DIAGNOSIS — Z9889 Other specified postprocedural states: Secondary | ICD-10-CM

## 2014-06-21 NOTE — Progress Notes (Signed)
[name] presents today one week status post [facility] bunion repair left foot and removal of fracture tibial sesamoid left foot. He denies fever chills nausea vomiting muscle aches or pains. States these been using crutches on a regular basis because he was told to by the nurses of the surgery center. He also states that he is fallen twice because of the use of his crutches. He denies any trauma to the toe.  Objective: Vital signs are stable he is alert and oriented x3. Dry sterile dressing was intact after the Cam Walker was removed. Dry sterile dressing was removed demonstrates mild edema no erythema cellulitis drainage or odor. Has good range of motion of the first metatarsophalangeal joint. Radiographic evaluation demonstrates capital fragment is in good position tibial sesamoid is completely resected.  Assessment: Well-healing surgical foot left x1 week.  Plan: Redressed the foot today dressed a compressive dressing encouraged him to walk without the crutches however he is to continue to keep his foot elevated 45 minutes out of the hour. I will followup with him in one week for suture removal.

## 2014-06-28 ENCOUNTER — Ambulatory Visit (INDEPENDENT_AMBULATORY_CARE_PROVIDER_SITE_OTHER): Payer: Medicare HMO | Admitting: Podiatry

## 2014-06-28 VITALS — BP 152/61 | HR 64 | Temp 97.5°F | Resp 16

## 2014-06-28 DIAGNOSIS — Z9889 Other specified postprocedural states: Secondary | ICD-10-CM

## 2014-06-28 NOTE — Progress Notes (Signed)
He presents today 2 weeks status post [place] bunion repair with tibial sesamoidectomy left foot. He denies fever chills nausea vomiting muscle aches and pains states that the toe is tender. When I asked him if he is been working his toe he states that she's been trying wiggle the toes.  Objective: Vital signs are stable he is alert and oriented x3. Dry sterile dressing was removed demonstrates no erythema mild edema no cellulitis drainage or odor margins well coapted sutures are intact sutures were removed today margins remain well coapted has limited range of motion dorsiflexion plantar flexion with tenderness on palpation of the first metatarsophalangeal joint.  Assessment: Well-healing surgical foot left.  Plan: discussed etiology pathology conservative versus surgical therapies. At this point I encouraged he and his wife to increase her range of motion of the first metatarsophalangeal joint of the left foot with manual manipulation. I also allow him to start getting the foot wet washing it thoroughly and applying lotion. We also switched into a compression anklet and a Darco shoe. I will followup with him in 2 weeks for another set of x-rays

## 2014-07-16 ENCOUNTER — Encounter: Payer: Self-pay | Admitting: Podiatry

## 2014-07-16 ENCOUNTER — Ambulatory Visit (INDEPENDENT_AMBULATORY_CARE_PROVIDER_SITE_OTHER): Payer: Medicare HMO

## 2014-07-16 ENCOUNTER — Ambulatory Visit (INDEPENDENT_AMBULATORY_CARE_PROVIDER_SITE_OTHER): Payer: Medicare HMO | Admitting: Podiatry

## 2014-07-16 DIAGNOSIS — M21619 Bunion of unspecified foot: Secondary | ICD-10-CM

## 2014-07-16 DIAGNOSIS — M21612 Bunion of left foot: Secondary | ICD-10-CM

## 2014-07-16 DIAGNOSIS — Z9889 Other specified postprocedural states: Secondary | ICD-10-CM

## 2014-07-16 NOTE — Progress Notes (Signed)
He presents today 5 weeks status post [facility] bunion repair left foot. He states it seems to be doing well with exception of the occasional swelling.  Objective: Vital signs are stable he is alert and oriented x3. There is no erythema edema cellulitis drainage or odor and appears to be healing quite nicely. He has good range of motion stiff and range. Radiographic evaluation does demonstrate

## 2014-08-13 ENCOUNTER — Ambulatory Visit (INDEPENDENT_AMBULATORY_CARE_PROVIDER_SITE_OTHER): Payer: Medicare HMO | Admitting: Podiatry

## 2014-08-13 ENCOUNTER — Ambulatory Visit (INDEPENDENT_AMBULATORY_CARE_PROVIDER_SITE_OTHER): Payer: Medicare PPO

## 2014-08-13 VITALS — BP 136/68 | HR 63 | Resp 16

## 2014-08-13 DIAGNOSIS — Z9889 Other specified postprocedural states: Secondary | ICD-10-CM

## 2014-08-13 DIAGNOSIS — M2010 Hallux valgus (acquired), unspecified foot: Secondary | ICD-10-CM

## 2014-08-13 NOTE — Progress Notes (Signed)
He presents today 2 months status post [place] bunion repair with tibial sesamoidectomy it appears to be doing quite well. He states that his stays swollen some but other than that is doing much better than prior to surgery.  Objective: Vital signs are stable he is alert and oriented x3 he has limited range of motion of the first metatarsophalangeal joint which is nontender on range of motion. Has moderate pitting edema to the dorsal lateral aspect of the left foot. Radiographic evaluation demonstrates well-healing surgical foot. No fractures are noted.  Assessment: Well-healing surgical foot left.  Plan: Get back to his regular routine I will followup with him as needed.

## 2015-05-21 ENCOUNTER — Encounter: Payer: Self-pay | Admitting: Internal Medicine

## 2015-05-21 ENCOUNTER — Encounter: Payer: Self-pay | Admitting: Gastroenterology

## 2015-05-21 ENCOUNTER — Ambulatory Visit (INDEPENDENT_AMBULATORY_CARE_PROVIDER_SITE_OTHER): Payer: Medicare PPO | Admitting: Internal Medicine

## 2015-05-21 VITALS — BP 122/62 | HR 60 | Temp 98.6°F | Ht 66.0 in | Wt 139.0 lb

## 2015-05-21 DIAGNOSIS — K589 Irritable bowel syndrome without diarrhea: Secondary | ICD-10-CM | POA: Diagnosis not present

## 2015-05-21 DIAGNOSIS — E785 Hyperlipidemia, unspecified: Secondary | ICD-10-CM

## 2015-05-21 DIAGNOSIS — I10 Essential (primary) hypertension: Secondary | ICD-10-CM

## 2015-05-21 DIAGNOSIS — Z0000 Encounter for general adult medical examination without abnormal findings: Secondary | ICD-10-CM | POA: Diagnosis not present

## 2015-05-21 LAB — CBC WITH DIFFERENTIAL/PLATELET
Basophils Absolute: 0 10*3/uL (ref 0.0–0.1)
Basophils Relative: 0.6 % (ref 0.0–3.0)
EOS ABS: 0.1 10*3/uL (ref 0.0–0.7)
Eosinophils Relative: 1.3 % (ref 0.0–5.0)
HEMATOCRIT: 45.5 % (ref 39.0–52.0)
Hemoglobin: 15.2 g/dL (ref 13.0–17.0)
LYMPHS ABS: 2.1 10*3/uL (ref 0.7–4.0)
Lymphocytes Relative: 30.5 % (ref 12.0–46.0)
MCHC: 33.3 g/dL (ref 30.0–36.0)
MCV: 88.7 fl (ref 78.0–100.0)
MONO ABS: 0.6 10*3/uL (ref 0.1–1.0)
Monocytes Relative: 8.9 % (ref 3.0–12.0)
NEUTROS ABS: 4.1 10*3/uL (ref 1.4–7.7)
Neutrophils Relative %: 58.7 % (ref 43.0–77.0)
Platelets: 255 10*3/uL (ref 150.0–400.0)
RBC: 5.13 Mil/uL (ref 4.22–5.81)
RDW: 13.6 % (ref 11.5–15.5)
WBC: 7 10*3/uL (ref 4.0–10.5)

## 2015-05-21 LAB — COMPREHENSIVE METABOLIC PANEL
ALK PHOS: 100 U/L (ref 39–117)
ALT: 21 U/L (ref 0–53)
AST: 20 U/L (ref 0–37)
Albumin: 4.5 g/dL (ref 3.5–5.2)
BUN: 10 mg/dL (ref 6–23)
CO2: 29 mEq/L (ref 19–32)
Calcium: 9.6 mg/dL (ref 8.4–10.5)
Chloride: 104 mEq/L (ref 96–112)
Creatinine, Ser: 0.91 mg/dL (ref 0.40–1.50)
GFR: 86.64 mL/min (ref >60.00)
Glucose, Bld: 103 mg/dL — ABNORMAL HIGH (ref 70–99)
Potassium: 5.1 mEq/L (ref 3.5–5.1)
SODIUM: 138 meq/L (ref 135–145)
TOTAL PROTEIN: 7.5 g/dL (ref 6.0–8.3)
Total Bilirubin: 1.5 mg/dL — ABNORMAL HIGH (ref 0.2–1.2)

## 2015-05-21 LAB — LIPID PANEL
CHOL/HDL RATIO: 8
Cholesterol: 254 mg/dL — ABNORMAL HIGH (ref 0–200)
HDL: 33 mg/dL — ABNORMAL LOW (ref >39.00)
LDL Cholesterol: 194 mg/dL — ABNORMAL HIGH (ref 0–99)
NONHDL: 221
Triglycerides: 134 mg/dL (ref 0.0–149.0)
VLDL: 26.8 mg/dL (ref 0.0–40.0)

## 2015-05-21 LAB — T4, FREE: FREE T4: 0.87 ng/dL (ref 0.60–1.60)

## 2015-05-21 NOTE — Progress Notes (Signed)
Subjective:    Patient ID: [name], male    DOB: 08/11/1941, 74 y.o.   MRN: [id number]  HPI Here for Medicare wellness and follow up of chronic medical problems Does have a physician in [place] for urgent care Reviewed form and advanced directives No alcohol or tobacco Exercises fairly regularly--gym and yard work No falls No depression or anhedonia Vision is fine. Hearing is not very good--not having much luck with aides Independent with instrumental ADLs No cognitive problems Reviewed other physicians  Feeling well Checks his BP regularly---usually 130/60-65 Always high at doctor's office No chest pain No SOB No dizziness or syncope No headaches  Cholesterol remains on the high side Doctor in [place] put him on crestor--caused muscle pain and he had to stop He has increased his fish oil for this  Current Outpatient Prescriptions on File Prior to Visit  Medication Sig Dispense Refill  . amLODipine (NORVASC) 5 MG tablet Take 0.5 tablets (2.5 mg total) by mouth daily. 90 tablet 3   No current facility-administered medications on file prior to visit.    Allergies  Allergen Reactions  . Crestor [Rosuvastatin] Other (See Comments)    Severe myalgias  . Atorvastatin     REACTION: aching  . Penicillins     REACTION: unspecified  . Simvastatin     REACTION: aching    Past Medical History  Diagnosis Date  . Hyperlipidemia   . Hypertension   . Arthritis   . IBS (irritable bowel syndrome)   . Bone spur 2010    right shoulder    Past Surgical History  Procedure Laterality Date  . Back surgery    . Hemorrhoid surgery  2004  . Skin cancer excision  2003-2005    right shoulder  . Inguinal hernia repair  02/11  . Rotator cuff repair  07/11    left/ bone spur Dr.Blackman    Family History  Problem Relation Age of Onset  . Cancer Neg Hx   . Diabetes Neg Hx     History   Social History  . Marital Status: Single    Spouse Name: N/A  . Number of  Children: 2  . Years of Education: N/A   Occupational History  . retired Microbiologist    Social History Main Topics  . Smoking status: Never Smoker   . Smokeless tobacco: Never Used  . Alcohol Use: Yes  . Drug Use: No  . Sexual Activity: Not on file   Other Topics Concern  . Not on file   Social History Narrative   2 sons, no contact   Splits his time between [place] and [place]   Has live-in since 2005   Has living will   Requests [name]--girlfriend or brother [name], as health care POA   Would accept resuscitation but no prolonged artificial ventilation   Wouldn't want tube feeds if cognitively unaware   Review of Systems Bowels are fine with fiber Will get pain if he eats corn or collards---from the diverticulosis Weight is stable Appetite is fine Sleeps fine Teeth are okay-- 1 Bolser need root canal. Keeps up with dentist Wears seat belt Has the valtrex for cold sores--tends to come on if he is stressed    Objective:   Physical Exam  Constitutional: He is oriented to person, place, and time. He appears well-developed and well-nourished. No distress.  HENT:  Mouth/Throat: Oropharynx is clear and moist. No oropharyngeal exudate.  Neck: Normal range of motion.  Neck supple. No thyromegaly present.  Cardiovascular: Normal rate, regular rhythm, normal heart sounds and intact distal pulses.  Exam reveals no gallop.   No murmur heard. Pulmonary/Chest: Effort normal and breath sounds normal. No respiratory distress. He has no wheezes. He has no rales.  Abdominal: Soft. There is no tenderness.  Musculoskeletal: He exhibits no edema or tenderness.  Lymphadenopathy:    He has no cervical adenopathy.  Neurological: He is alert and oriented to person, place, and time.  President-- "[name], [name], [name]---then [name]" [phone]   I can't subtract well D-l-o-r-w Recall 2/3  Skin: No rash noted. No erythema.  Psychiatric: He has a normal mood  and affect. His behavior is normal.          Assessment & Plan:

## 2015-05-21 NOTE — Assessment & Plan Note (Signed)
Intolerant of multiple statins Now on fish oil Will recheck levels

## 2015-05-21 NOTE — Assessment & Plan Note (Signed)
BP Readings from Last 3 Encounters:  05/21/15 122/62  08/13/14 136/68  06/28/14 152/61   Good control No changes needed

## 2015-05-21 NOTE — Progress Notes (Signed)
Pre visit review using our clinic review tool, if applicable. No additional management support is needed unless otherwise documented below in the visit note. 

## 2015-05-21 NOTE — Assessment & Plan Note (Signed)
Uses levsin prn 

## 2015-05-21 NOTE — Assessment & Plan Note (Signed)
I have personally reviewed the Medicare Annual Wellness questionnaire and have noted 1. The patient's medical and social history 2. Their use of alcohol, tobacco or illicit drugs 3. Their current medications and supplements 4. The patient's functional ability including ADL's, fall risks, home safety risks and hearing or visual             impairment. 5. Diet and physical activities 6. Evidence for depression or mood disorders  The patients weight, height, BMI and visual acuity have been recorded in the chart I have made referrals, counseling and provided education to the patient based review of the above and I have provided the pt with a written personalized care plan for preventive services.  I have provided you with a copy of your personalized plan for preventive services. Please take the time to review along with your updated medication list.   UTD on imms Discussed not getting PSA--doctor in [place] did it Colonoscopy due 2020 Stays active

## 2015-05-22 ENCOUNTER — Encounter: Payer: Self-pay | Admitting: *Deleted

## 2015-07-08 ENCOUNTER — Other Ambulatory Visit: Payer: Self-pay | Admitting: Internal Medicine

## 2016-05-25 ENCOUNTER — Ambulatory Visit (INDEPENDENT_AMBULATORY_CARE_PROVIDER_SITE_OTHER): Payer: Medicare PPO | Admitting: Internal Medicine

## 2016-05-25 ENCOUNTER — Encounter: Payer: Self-pay | Admitting: Internal Medicine

## 2016-05-25 VITALS — BP 138/66 | HR 57 | Temp 97.6°F | Ht 65.75 in | Wt 141.0 lb

## 2016-05-25 DIAGNOSIS — Z0000 Encounter for general adult medical examination without abnormal findings: Secondary | ICD-10-CM | POA: Diagnosis not present

## 2016-05-25 DIAGNOSIS — E785 Hyperlipidemia, unspecified: Secondary | ICD-10-CM | POA: Diagnosis not present

## 2016-05-25 DIAGNOSIS — I10 Essential (primary) hypertension: Secondary | ICD-10-CM

## 2016-05-25 DIAGNOSIS — Z7189 Other specified counseling: Secondary | ICD-10-CM | POA: Insufficient documentation

## 2016-05-25 LAB — COMPREHENSIVE METABOLIC PANEL
ALK PHOS: 95 U/L (ref 39–117)
ALT: 19 U/L (ref 0–53)
AST: 22 U/L (ref 0–37)
Albumin: 4.7 g/dL (ref 3.5–5.2)
BILIRUBIN TOTAL: 1.1 mg/dL (ref 0.2–1.2)
BUN: 13 mg/dL (ref 6–23)
CALCIUM: 9.6 mg/dL (ref 8.4–10.5)
CHLORIDE: 101 meq/L (ref 96–112)
CO2: 29 mEq/L (ref 19–32)
CREATININE: 0.83 mg/dL (ref 0.40–1.50)
GFR: 96.09 mL/min (ref >60.00)
Glucose, Bld: 94 mg/dL (ref 70–99)
Potassium: 3.9 mEq/L (ref 3.5–5.1)
SODIUM: 136 meq/L (ref 135–145)
TOTAL PROTEIN: 7.6 g/dL (ref 6.0–8.3)

## 2016-05-25 LAB — LIPID PANEL
CHOLESTEROL: 256 mg/dL — AB (ref 0–200)
HDL: 31.2 mg/dL — AB (ref >39.00)
LDL CALC: 195 mg/dL — AB (ref 0–99)
NonHDL: 225.23
TRIGLYCERIDES: 153 mg/dL — AB (ref 0.0–149.0)
Total CHOL/HDL Ratio: 8
VLDL: 30.6 mg/dL (ref 0.0–40.0)

## 2016-05-25 LAB — CBC WITH DIFFERENTIAL/PLATELET
BASOS ABS: 0 10*3/uL (ref 0.0–0.1)
BASOS PCT: 0.4 % (ref 0.0–3.0)
EOS ABS: 0.1 10*3/uL (ref 0.0–0.7)
Eosinophils Relative: 1 % (ref 0.0–5.0)
HCT: 43.9 % (ref 39.0–52.0)
HEMOGLOBIN: 14.7 g/dL (ref 13.0–17.0)
LYMPHS PCT: 36.6 % (ref 12.0–46.0)
Lymphs Abs: 2.2 10*3/uL (ref 0.7–4.0)
MCHC: 33.6 g/dL (ref 30.0–36.0)
MCV: 88.2 fl (ref 78.0–100.0)
MONO ABS: 0.6 10*3/uL (ref 0.1–1.0)
Monocytes Relative: 10.4 % (ref 3.0–12.0)
Neutro Abs: 3.2 10*3/uL (ref 1.4–7.7)
Neutrophils Relative %: 51.6 % (ref 43.0–77.0)
Platelets: 253 10*3/uL (ref 150.0–400.0)
RBC: 4.98 Mil/uL (ref 4.22–5.81)
RDW: 14 % (ref 11.5–15.5)
WBC: 6.1 10*3/uL (ref 4.0–10.5)

## 2016-05-25 NOTE — Progress Notes (Signed)
Subjective:    Patient ID: [name], male    DOB: June 06, 1941, 75 y.o.   MRN: [id number]  HPI Here for Medicare wellness and follow up of chronic health issues Reviewed form and advanced directives Reviewed other doctors No tobacco or alcohol Vision is fine Hearing loss persists--- doesn't want hearing aides No falls  No depression or anhedonia Exercises regularly No apparent cognitive problems Independent with instrumental ADLs  No new concerns Does have chronic pain in shoulders--no limitation of motion Mild other joint issues--stiff hands at times Occasionally takes tylenol at bedtime Worse in colder weather  Still splits time between [place] and [place] Stable relationship  No chest pain No palpitations  No dizziness or syncope No SOB [place] have slight leg swelling with prolonged standing BP at home 120/50-135/60  Has failed multiple statins Just takes fish oil and krill oil  Current Outpatient Prescriptions on File Prior to Visit  Medication Sig Dispense Refill  . amLODipine (NORVASC) 5 MG tablet TAKE 1/2 TABLET BY MOUTH DAILY AS DIRECTED 90 tablet 3  . hyoscyamine (LEVSIN SL) 0.125 MG SL tablet dissolve 1 tablet under the tongue twice a day if needed for cramping 180 tablet 0  . valACYclovir (VALTREX) 1000 MG tablet Take 1,000 mg by mouth as needed.     No current facility-administered medications on file prior to visit.    Allergies  Allergen Reactions  . Crestor [Rosuvastatin] Other (See Comments)    Severe myalgias  . Atorvastatin     REACTION: aching  . Penicillins     REACTION: unspecified  . Simvastatin     REACTION: aching    Past Medical History  Diagnosis Date  . Hyperlipidemia   . Hypertension   . Arthritis   . IBS (irritable bowel syndrome)   . Bone spur 2010    right shoulder    Past Surgical History  Procedure Laterality Date  . Back surgery    . Hemorrhoid surgery  2004  . Skin cancer excision  2003-2005    right shoulder  .  Inguinal hernia repair  02/11  . Rotator cuff repair  07/11    left/ bone spur Dr.Blackman    Family History  Problem Relation Age of Onset  . Cancer Neg Hx   . Diabetes Neg Hx     Social History   Social History  . Marital Status: Single    Spouse Name: N/A  . Number of Children: 2  . Years of Education: N/A   Occupational History  . retired Microbiologist    Social History Main Topics  . Smoking status: Never Smoker   . Smokeless tobacco: Never Used  . Alcohol Use: Yes  . Drug Use: No  . Sexual Activity: Not on file   Other Topics Concern  . Not on file   Social History Narrative   2 sons, no contact   Splits his time between [place] and [place]   Has live-in since 2005   Has living will   Requests Bernice--girlfriend or brother Herbie [place], as health care POA   Would accept resuscitation but no prolonged artificial ventilation   Wouldn't want tube feeds if cognitively unaware   Review of Systems Has the valacyclovir for prn use for cold sores. Bowels have been okay--mostly controlling with fiber daily Still has to avoid greens, broccoli, etc---will give him cramping No blood in stool Appetite is good Weight stable Sleeps well-- except when shoulders are bothering him Wears  seat belt Teeth okay--regular with dentist (in [place]) Angelica well. Occasional nocturia No rash or suspicious skin lesions. Yearly derm visit (in [place] also)    Objective:   Physical Exam  Constitutional: He is oriented to person, place, and time. He appears well-developed and well-nourished. No distress.  HENT:  Mouth/Throat: Oropharynx is clear and moist. No oropharyngeal exudate.  Neck: Normal range of motion. Neck supple. No thyromegaly present.  Cardiovascular: Normal rate, regular rhythm, normal heart sounds and intact distal pulses.  Exam reveals no gallop.   No murmur heard. Pulmonary/Chest: Effort normal and breath sounds normal. No respiratory distress.  He has no wheezes. He has no rales.  Abdominal: Soft. There is no tenderness.  Musculoskeletal: He exhibits no edema or tenderness.  Lymphadenopathy:    He has no cervical adenopathy.  Neurological: He is alert and oriented to person, place, and time.  President-- "Trump, Obama, Bush" [phone] D-l-r-o-w Recall 3/3  Skin: No rash noted. No erythema.  Psychiatric: He has a normal mood and affect. His behavior is normal.          Assessment & Plan:

## 2016-05-25 NOTE — Assessment & Plan Note (Signed)
See social history 

## 2016-05-25 NOTE — Progress Notes (Signed)
Pre visit review using our clinic review tool, if applicable. No additional management support is needed unless otherwise documented below in the visit note. 

## 2016-05-25 NOTE — Assessment & Plan Note (Signed)
BP Readings from Last 3 Encounters:  05/25/16 138/66  05/21/15 122/62  08/13/14 136/68   Good control on amlodipine

## 2016-05-25 NOTE — Assessment & Plan Note (Signed)
I have personally reviewed the Medicare Annual Wellness questionnaire and have noted 1. The patient's medical and social history 2. Their use of alcohol, tobacco or illicit drugs 3. Their current medications and supplements 4. The patient's functional ability including ADL's, fall risks, home safety risks and hearing or visual             impairment. 5. Diet and physical activities 6. Evidence for depression or mood disorders  The patients weight, height, BMI and visual acuity have been recorded in the chart I have made referrals, counseling and provided education to the patient based review of the above and I have provided the pt with a written personalized care plan for preventive services.  I have provided you with a copy of your personalized plan for preventive services. Please take the time to review along with your updated medication list.  No PSA due to age Colon due 2020 Yearly flu shot Stays active

## 2016-05-25 NOTE — Assessment & Plan Note (Signed)
Failed statins Takes fish oil, etc

## 2016-05-27 ENCOUNTER — Encounter: Payer: Medicare PPO | Admitting: Internal Medicine

## 2016-06-22 ENCOUNTER — Other Ambulatory Visit: Payer: Self-pay

## 2016-06-22 MED ORDER — HYOSCYAMINE SULFATE 0.125 MG SL SUBL: SUBLINGUAL_TABLET | SUBLINGUAL | Status: DC

## 2016-06-22 MED ORDER — AMLODIPINE BESYLATE 5 MG PO TABS: ORAL_TABLET | ORAL | Status: DC

## 2016-06-22 NOTE — Telephone Encounter (Signed)
Requesting refill hyoscyamine and amlodipine (amlodipine refill done per protocol). [facility] [address]. Last annual exam on 05/25/16.

## 2016-06-22 NOTE — Telephone Encounter (Signed)
Approved: #[id number] x 1

## 2017-04-26 ENCOUNTER — Telehealth (INDEPENDENT_AMBULATORY_CARE_PROVIDER_SITE_OTHER): Payer: Self-pay | Admitting: Orthopaedic Surgery

## 2017-04-26 NOTE — Telephone Encounter (Signed)
[place] RECORDS FAXED TO DR JEFFREFY POGGI [phone] PER PATIENTS REQUEST

## 2017-05-13 ENCOUNTER — Telehealth: Payer: Self-pay

## 2017-05-13 NOTE — Telephone Encounter (Signed)
V/M left that pt has appt on 05/28/17 CPX med wellness. Pt had CPX in [place] end of 02/2017. Wants to know if gets that CPX results sent to Dr Silvio Pate is it necessary to fill out med wellness paperwork received and does pt need to come on 05/28/17. Pt is not sure if ins will pay for 2 physicals in one year. Request cb.

## 2017-05-18 NOTE — Telephone Encounter (Signed)
Left message to call office

## 2017-05-18 NOTE — Telephone Encounter (Signed)
Spoke to pt's wife. The appt 06-02-17 is for BP F/U 15 mins.

## 2017-05-18 NOTE — Telephone Encounter (Signed)
No--we can't do the wellness or physical (only 1 per year regardless of where it is) If he is doing fine, it is okay to cancel the appt altogether (but I would then like to see him next year to keep up with his status) If he wants to keep the appt, it can be changed to a 15 minute follow up only

## 2017-05-28 ENCOUNTER — Encounter: Payer: Medicare PPO | Admitting: Internal Medicine

## 2017-06-02 ENCOUNTER — Ambulatory Visit (INDEPENDENT_AMBULATORY_CARE_PROVIDER_SITE_OTHER): Payer: Medicare PPO | Admitting: Internal Medicine

## 2017-06-02 ENCOUNTER — Encounter: Payer: Self-pay | Admitting: Internal Medicine

## 2017-06-02 VITALS — BP 138/66 | HR 64 | Temp 97.9°F | Wt 142.2 lb

## 2017-06-02 DIAGNOSIS — K589 Irritable bowel syndrome without diarrhea: Secondary | ICD-10-CM

## 2017-06-02 DIAGNOSIS — I10 Essential (primary) hypertension: Secondary | ICD-10-CM | POA: Diagnosis not present

## 2017-06-02 DIAGNOSIS — B001 Herpesviral vesicular dermatitis: Secondary | ICD-10-CM | POA: Diagnosis not present

## 2017-06-02 DIAGNOSIS — E785 Hyperlipidemia, unspecified: Secondary | ICD-10-CM | POA: Diagnosis not present

## 2017-06-02 MED ORDER — AMLODIPINE BESYLATE 5 MG PO TABS: ORAL_TABLET | ORAL | 3 refills | Status: DC

## 2017-06-02 NOTE — Progress Notes (Signed)
Subjective:    Patient ID: [name], male    DOB: 1941-05-27, 76 y.o.   MRN: [id number]  HPI Here for follow up of HTN and other chronic medical conditions Gets Medicare wellness by physician in [place] splits time here and [place] Occasionally checks BP 122/65-70 usually  No headaches No chest pain No dizziness or syncope  Uses the hyoscyamine prn Depends on what he eats  Hasn't had recent herpes problems Was mostly cold sores and rarely genital No recent exacerbations  Had vascular screening at [employer] everything was fine Doesn't really want cholesterol meds  Current Outpatient Prescriptions on File Prior to Visit  Medication Sig Dispense Refill  . amLODipine (NORVASC) 5 MG tablet TAKE 1/2 TABLET BY MOUTH DAILY AS DIRECTED 90 tablet 3  . hyoscyamine (LEVSIN SL) 0.125 MG SL tablet dissolve 1 tablet under the tongue twice a day if needed for cramping 180 tablet 1  . valACYclovir (VALTREX) 1000 MG tablet Take 1,000 mg by mouth as needed.     No current facility-administered medications on file prior to visit.     Allergies  Allergen Reactions  . Crestor [Rosuvastatin] Other (See Comments)    Severe myalgias  . Atorvastatin     REACTION: aching  . Penicillins     REACTION: unspecified  . Simvastatin     REACTION: aching    Past Medical History:  Diagnosis Date  . Arthritis   . Bone spur 2010   right shoulder  . Hyperlipidemia   . Hypertension   . IBS (irritable bowel syndrome)     Past Surgical History:  Procedure Laterality Date  . BACK SURGERY    . HEMORRHOID SURGERY  2004  . INGUINAL HERNIA REPAIR  02/11  . ROTATOR CUFF REPAIR  07/11   left/ bone spur Dr.Blackman  . SKIN CANCER EXCISION  2003-2005   right shoulder    Family History  Problem Relation Age of Onset  . Cancer Neg Hx   . Diabetes Neg Hx     Social History   Social History  . Marital status: Single    Spouse name: N/A  . Number of children: 2  . Years of  education: N/A   Occupational History  . retired Microbiologist Retired   Social History Main Topics  . Smoking status: Never Smoker  . Smokeless tobacco: Never Used  . Alcohol use Yes  . Drug use: No  . Sexual activity: Not on file   Other Topics Concern  . Not on file   Social History Narrative   2 sons, no contact   Splits his time between [place] and [place]   Has live-in since 2005   Has living will   Requests Bernice--girlfriend or brother Herbie [place], as health care POA   Would accept resuscitation but no prolonged artificial ventilation   Wouldn't want tube feeds if cognitively unaware   Review of Systems No falls No depression or anhedonia Continues to do his own yard work, clean gutters, paints  Has 20 x 40 foot garden Walks also Weight stable Mild edema in feet if sitting all day with legs down Got azithromycin at urgent care last week for sinus. Mostly better now    Objective:   Physical Exam  Constitutional: He appears well-developed and well-nourished. No distress.  Neck: Normal range of motion. Neck supple. No thyromegaly present.  Cardiovascular: Normal rate, regular rhythm, normal heart sounds and intact distal pulses.  Exam  reveals no gallop.   No murmur heard. Pulmonary/Chest: Effort normal and breath sounds normal. No respiratory distress. He has no wheezes. He has no rales.  Abdominal: Soft. There is no tenderness.  Musculoskeletal: He exhibits no edema or tenderness.  Lymphadenopathy:    He has no cervical adenopathy.  Psychiatric: He has a normal mood and affect. His behavior is normal.          Assessment & Plan:

## 2017-06-02 NOTE — Assessment & Plan Note (Signed)
No recent flares Has the med for prn

## 2017-06-02 NOTE — Assessment & Plan Note (Signed)
Uses the med intermittently No major issues Had FIT by insurance--didn't keep record but was negative

## 2017-06-02 NOTE — Assessment & Plan Note (Signed)
Discussed primary prevention---he doesn't want statin

## 2017-06-02 NOTE — Assessment & Plan Note (Addendum)
BP Readings from Last 3 Encounters:  06/02/17 138/66  05/25/16 138/66  05/21/15 122/62   Good control No change needed Had labs in [place]---- told everything fine except cholesterol

## 2017-07-05 ENCOUNTER — Other Ambulatory Visit: Payer: Self-pay | Admitting: Surgery

## 2017-07-05 DIAGNOSIS — M19011 Primary osteoarthritis, right shoulder: Secondary | ICD-10-CM

## 2017-07-07 ENCOUNTER — Ambulatory Visit: Payer: Medicare PPO

## 2017-07-13 ENCOUNTER — Ambulatory Visit
Admission: RE | Admit: 2017-07-13 | Discharge: 2017-07-13 | Disposition: A | Discharge status: Home / Self Care | Payer: Medicare PPO | Source: Ambulatory Visit | Attending: Surgery | Admitting: Surgery

## 2017-07-13 DIAGNOSIS — M75111 Incomplete rotator cuff tear or rupture of right shoulder, not specified as traumatic: Secondary | ICD-10-CM | POA: Insufficient documentation

## 2017-07-13 DIAGNOSIS — M19011 Primary osteoarthritis, right shoulder: Secondary | ICD-10-CM

## 2017-07-13 DIAGNOSIS — M7581 Other shoulder lesions, right shoulder: Secondary | ICD-10-CM | POA: Diagnosis present

## 2018-05-19 ENCOUNTER — Other Ambulatory Visit: Payer: Self-pay

## 2018-05-19 ENCOUNTER — Encounter
Admission: RE | Admit: 2018-05-19 | Discharge: 2018-05-19 | Disposition: A | Discharge status: Home / Self Care | Payer: Medicare PPO | Source: Ambulatory Visit | Attending: Surgery | Admitting: Surgery

## 2018-05-19 DIAGNOSIS — I4510 Unspecified right bundle-branch block: Secondary | ICD-10-CM | POA: Diagnosis not present

## 2018-05-19 DIAGNOSIS — I10 Essential (primary) hypertension: Secondary | ICD-10-CM | POA: Insufficient documentation

## 2018-05-19 DIAGNOSIS — Z01810 Encounter for preprocedural cardiovascular examination: Secondary | ICD-10-CM | POA: Insufficient documentation

## 2018-05-19 DIAGNOSIS — R001 Bradycardia, unspecified: Secondary | ICD-10-CM | POA: Insufficient documentation

## 2018-05-19 NOTE — Patient Instructions (Signed)
Your procedure is scheduled on: Thurs. 6/619 Report to Day Surgery. To find out your arrival time please call [phone] between 1PM - 3PM on Wed. 05/25/18.  Remember: Instructions that are not followed completely Nier result in serious medical risk, up to and including death, or upon the discretion of your surgeon and anesthesiologist your surgery Sherrod need to be rescheduled.     _X__ 1. Do not eat food after midnight the night before your procedure.                 No gum chewing or hard candies. You Mongiello drink clear liquids up to 2 hours                 before you are scheduled to arrive for your surgery- DO not drink clear                 liquids within 2 hours of the start of your surgery.                 Clear Liquids include:  water, apple juice without pulp, clear carbohydrate                 drink such as Clearfast of Gartorade, Black Coffee or Tea (Do not add                 anything to coffee or tea).  __X__2.  On the morning of surgery brush your teeth with toothpaste and water, you Gillock rinse your mouth with mouthwash if you wish.  Do not swallow any toothpaste of mouthwash.     _X__ 3.  No Alcohol for 24 hours before or after surgery.   ___ 4.  Do Not Smoke or use e-cigarettes For 24 Hours Prior to Your Surgery.                 Do not use any chewable tobacco products for at least 6 hours prior to                 surgery.  ____  5.  Bring all medications with you on the day of surgery if instructed.   _x___  6.  Notify your doctor if there is any change in your medical condition      (cold, fever, infections).     Do not wear jewelry, make-up, hairpins, clips or nail polish. Do not wear lotions, powders, or perfumes. You Ellzey wear deodorant. Do not shave 48 hours prior to surgery. Men Malkiewicz shave face and neck. Do not bring valuables to the hospital.    [facility] is not responsible for any belongings or valuables.  Contacts, dentures or bridgework  Gonyea not be worn into surgery. Leave your suitcase in the car. After surgery it Lieder be brought to your room. For patients admitted to the hospital, discharge time is determined by your treatment team.   Patients discharged the day of surgery will not be allowed to drive home.   Please read over the following fact sheets that you were given:    ___x_ Take these medicines the morning of surgery with A SIP OF WATER:    1. amLODipine (NORVASC) 2.5 MG tablet  2.acetaminophen (TYLENOL) 650 MG CR tablet if needed   3. lovastatin (MEVACOR) 20 MG tablet  4.  5.  6.  ____ Fleet Enema (as directed)   __x__ Use CHG Soap as directed  ____ Use inhalers on the day of surgery  ____ Stop metformin 2 days prior to surgery    ____ Take 1/2 of usual insulin dose the night before surgery. No insulin the morning          of surgery.   ____ Stop Coumadin/Plavix/aspirin on   __x__ Stop Anti-inflammatories Tylenol only   __x__ Stop supplements until after surgery.  vitamin E 400 UNIT capsule, Coenzyme Q10 (COQ10) 200 MG CAPS ____ Bring C-Pap to the hospital.

## 2018-05-25 MED ORDER — CLINDAMYCIN PHOSPHATE 900 MG/50ML IV SOLN
900.0000 mg | Freq: Once | INTRAVENOUS | Status: AC
  Administered 2018-05-26: 900 mg via INTRAVENOUS

## 2018-05-26 ENCOUNTER — Ambulatory Visit: Payer: Medicare PPO | Admitting: Anesthesiology

## 2018-05-26 ENCOUNTER — Encounter: Payer: Self-pay | Admitting: *Deleted

## 2018-05-26 ENCOUNTER — Ambulatory Visit
Admission: RE | Admit: 2018-05-26 | Discharge: 2018-05-26 | Disposition: A | Discharge status: Home / Self Care | Payer: Medicare PPO | Source: Ambulatory Visit | Attending: Surgery | Admitting: Surgery

## 2018-05-26 ENCOUNTER — Other Ambulatory Visit: Payer: Self-pay

## 2018-05-26 ENCOUNTER — Encounter
Admission: RE | Disposition: A | Discharge status: Home / Self Care | Payer: Self-pay | Source: Ambulatory Visit | Attending: Surgery

## 2018-05-26 DIAGNOSIS — Z79899 Other long term (current) drug therapy: Secondary | ICD-10-CM | POA: Insufficient documentation

## 2018-05-26 DIAGNOSIS — I10 Essential (primary) hypertension: Secondary | ICD-10-CM | POA: Insufficient documentation

## 2018-05-26 DIAGNOSIS — M75111 Incomplete rotator cuff tear or rupture of right shoulder, not specified as traumatic: Secondary | ICD-10-CM | POA: Diagnosis present

## 2018-05-26 DIAGNOSIS — M7541 Impingement syndrome of right shoulder: Secondary | ICD-10-CM | POA: Diagnosis not present

## 2018-05-26 HISTORY — PX: SHOULDER ARTHROSCOPY WITH OPEN ROTATOR CUFF REPAIR: SHX6092

## 2018-05-26 SURGERY — ARTHROSCOPY, SHOULDER WITH REPAIR, ROTATOR CUFF, OPEN
Anesthesia: General | Site: Shoulder | Laterality: Right | Wound class: Clean

## 2018-05-26 MED ORDER — ACETAMINOPHEN 10 MG/ML IV SOLN
INTRAVENOUS | Status: DC | PRN
  Administered 2018-05-26: 1000 mg via INTRAVENOUS

## 2018-05-26 MED ORDER — ROCURONIUM BROMIDE 100 MG/10ML IV SOLN
INTRAVENOUS | Status: DC | PRN
  Administered 2018-05-26: 35 mg via INTRAVENOUS

## 2018-05-26 MED ORDER — ROCURONIUM BROMIDE 50 MG/5ML IV SOLN
INTRAVENOUS | Status: AC
  Filled 2018-05-26: qty 1

## 2018-05-26 MED ORDER — CLINDAMYCIN PHOSPHATE 900 MG/50ML IV SOLN
INTRAVENOUS | Status: AC
  Filled 2018-05-26: qty 50

## 2018-05-26 MED ORDER — PROPOFOL 10 MG/ML IV BOLUS
INTRAVENOUS | Status: DC | PRN
  Administered 2018-05-26: 120 mg via INTRAVENOUS

## 2018-05-26 MED ORDER — MIDAZOLAM HCL 2 MG/2ML IJ SOLN
1.0000 mg | Freq: Once | INTRAMUSCULAR | Status: AC
  Administered 2018-05-26: 1 mg via INTRAVENOUS

## 2018-05-26 MED ORDER — KETOROLAC TROMETHAMINE 30 MG/ML IJ SOLN
INTRAMUSCULAR | Status: AC
  Filled 2018-05-26: qty 1

## 2018-05-26 MED ORDER — BUPIVACAINE LIPOSOME 1.3 % IJ SUSP
INTRAMUSCULAR | Status: AC
  Filled 2018-05-26: qty 20

## 2018-05-26 MED ORDER — FENTANYL CITRATE (PF) 100 MCG/2ML IJ SOLN
INTRAMUSCULAR | Status: DC | PRN
  Administered 2018-05-26: 100 ug via INTRAVENOUS

## 2018-05-26 MED ORDER — FAMOTIDINE 20 MG PO TABS
20.0000 mg | ORAL_TABLET | Freq: Once | ORAL | Status: AC
  Administered 2018-05-26: 20 mg via ORAL

## 2018-05-26 MED ORDER — EPHEDRINE SULFATE 50 MG/ML IJ SOLN
INTRAMUSCULAR | Status: DC | PRN
  Administered 2018-05-26: 20 mg via INTRAVENOUS

## 2018-05-26 MED ORDER — OXYCODONE HCL 5 MG PO TABS: 5.0000 mg | ORAL_TABLET | ORAL | 0 refills | Status: DC | PRN

## 2018-05-26 MED ORDER — ACETAMINOPHEN 160 MG/5ML PO SOLN
325.0000 mg | ORAL | Status: DC | PRN
  Filled 2018-05-26: qty 20.3

## 2018-05-26 MED ORDER — FENTANYL CITRATE (PF) 100 MCG/2ML IJ SOLN
50.0000 ug | Freq: Once | INTRAMUSCULAR | Status: AC
  Administered 2018-05-26: 50 ug via INTRAVENOUS

## 2018-05-26 MED ORDER — EPINEPHRINE PF 1 MG/ML IJ SOLN
INTRAMUSCULAR | Status: DC | PRN
  Administered 2018-05-26: 2 mL

## 2018-05-26 MED ORDER — BUPIVACAINE HCL (PF) 0.5 % IJ SOLN
INTRAMUSCULAR | Status: AC
  Filled 2018-05-26: qty 10

## 2018-05-26 MED ORDER — MIDAZOLAM HCL 2 MG/2ML IJ SOLN: 1.0000 mg | Freq: Once | INTRAMUSCULAR | Status: DC

## 2018-05-26 MED ORDER — SUGAMMADEX SODIUM 200 MG/2ML IV SOLN
INTRAVENOUS | Status: AC
  Filled 2018-05-26: qty 2

## 2018-05-26 MED ORDER — BUPIVACAINE HCL (PF) 0.5 % IJ SOLN
INTRAMUSCULAR | Status: DC | PRN
  Administered 2018-05-26: 10 mL via PERINEURAL

## 2018-05-26 MED ORDER — KETOROLAC TROMETHAMINE 30 MG/ML IJ SOLN
INTRAMUSCULAR | Status: DC | PRN
  Administered 2018-05-26: 30 mg via INTRAVENOUS

## 2018-05-26 MED ORDER — ONDANSETRON HCL 4 MG/2ML IJ SOLN
INTRAMUSCULAR | Status: AC
  Filled 2018-05-26: qty 2

## 2018-05-26 MED ORDER — ONDANSETRON HCL 4 MG/2ML IJ SOLN
INTRAMUSCULAR | Status: DC | PRN
  Administered 2018-05-26: 4 mg via INTRAVENOUS

## 2018-05-26 MED ORDER — MIDAZOLAM HCL 2 MG/2ML IJ SOLN
INTRAMUSCULAR | Status: AC
  Administered 2018-05-26: 1 mg via INTRAVENOUS
  Filled 2018-05-26: qty 2

## 2018-05-26 MED ORDER — LIDOCAINE HCL (PF) 2 % IJ SOLN
INTRAMUSCULAR | Status: AC
  Filled 2018-05-26: qty 10

## 2018-05-26 MED ORDER — MEPERIDINE HCL 50 MG/ML IJ SOLN: 6.2500 mg | INTRAMUSCULAR | Status: DC | PRN

## 2018-05-26 MED ORDER — FENTANYL CITRATE (PF) 100 MCG/2ML IJ SOLN: 50.0000 ug | Freq: Once | INTRAMUSCULAR | Status: DC

## 2018-05-26 MED ORDER — LACTATED RINGERS IV SOLN
INTRAVENOUS | Status: DC
  Administered 2018-05-26: 12:00:00 via INTRAVENOUS

## 2018-05-26 MED ORDER — FENTANYL CITRATE (PF) 100 MCG/2ML IJ SOLN
INTRAMUSCULAR | Status: AC
Start: 2018-05-26 — End: ?
  Filled 2018-05-26: qty 2

## 2018-05-26 MED ORDER — HYDROCODONE-ACETAMINOPHEN 7.5-325 MG PO TABS
1.0000 | ORAL_TABLET | Freq: Once | ORAL | Status: DC | PRN
  Filled 2018-05-26: qty 1

## 2018-05-26 MED ORDER — GLYCOPYRROLATE 0.2 MG/ML IJ SOLN
INTRAMUSCULAR | Status: DC | PRN
  Administered 2018-05-26: 0.2 mg via INTRAVENOUS

## 2018-05-26 MED ORDER — FENTANYL CITRATE (PF) 100 MCG/2ML IJ SOLN
INTRAMUSCULAR | Status: AC
  Administered 2018-05-26: 50 ug via INTRAVENOUS
  Filled 2018-05-26: qty 2

## 2018-05-26 MED ORDER — BUPIVACAINE-EPINEPHRINE 0.5% -1:200000 IJ SOLN
INTRAMUSCULAR | Status: DC | PRN
  Administered 2018-05-26: 30 mL

## 2018-05-26 MED ORDER — PROMETHAZINE HCL 25 MG/ML IJ SOLN: 6.2500 mg | INTRAMUSCULAR | Status: DC | PRN

## 2018-05-26 MED ORDER — FAMOTIDINE 20 MG PO TABS
ORAL_TABLET | ORAL | Status: AC
  Filled 2018-05-26: qty 1

## 2018-05-26 MED ORDER — SODIUM CHLORIDE 0.9 % IV SOLN
INTRAVENOUS | Status: DC | PRN
  Administered 2018-05-26: 30 ug/min via INTRAVENOUS

## 2018-05-26 MED ORDER — PROPOFOL 10 MG/ML IV BOLUS
INTRAVENOUS | Status: AC
Start: 2018-05-26 — End: ?
  Filled 2018-05-26: qty 20

## 2018-05-26 MED ORDER — BUPIVACAINE LIPOSOME 1.3 % IJ SUSP
INTRAMUSCULAR | Status: DC | PRN
  Administered 2018-05-26: 20 mL via PERINEURAL

## 2018-05-26 MED ORDER — PHENYLEPHRINE HCL 10 MG/ML IJ SOLN
INTRAMUSCULAR | Status: DC | PRN
  Administered 2018-05-26 (×2): 50 ug via INTRAVENOUS

## 2018-05-26 MED ORDER — SUGAMMADEX SODIUM 200 MG/2ML IV SOLN
INTRAVENOUS | Status: DC | PRN
  Administered 2018-05-26: 150 mg via INTRAVENOUS

## 2018-05-26 MED ORDER — LIDOCAINE HCL (CARDIAC) PF 100 MG/5ML IV SOSY
PREFILLED_SYRINGE | INTRAVENOUS | Status: DC | PRN
  Administered 2018-05-26: 60 mg via INTRAVENOUS

## 2018-05-26 MED ORDER — ACETAMINOPHEN 325 MG PO TABS: 325.0000 mg | ORAL_TABLET | ORAL | Status: DC | PRN

## 2018-05-26 MED ORDER — DEXAMETHASONE SODIUM PHOSPHATE 10 MG/ML IJ SOLN
INTRAMUSCULAR | Status: AC
Start: 2018-05-26 — End: ?
  Filled 2018-05-26: qty 1

## 2018-05-26 MED ORDER — ACETAMINOPHEN 10 MG/ML IV SOLN
INTRAVENOUS | Status: AC
  Filled 2018-05-26: qty 100

## 2018-05-26 MED ORDER — DEXAMETHASONE SODIUM PHOSPHATE 10 MG/ML IJ SOLN
INTRAMUSCULAR | Status: DC | PRN
  Administered 2018-05-26: 10 mg via INTRAVENOUS

## 2018-05-26 MED ORDER — LIDOCAINE HCL (PF) 1 % IJ SOLN
INTRAMUSCULAR | Status: AC
  Filled 2018-05-26: qty 5

## 2018-05-26 SURGICAL SUPPLY — 49 items
ANCH SUT 2 2.9 2 LD TPR NDL (Anchor) ×2 IMPLANT
ANCH SUT 2 2/0 ABS BRD STRL (Anchor) ×1 IMPLANT
ANCH SUT KNTLS STRL SHLDR SYS (Anchor) ×1 IMPLANT
ANCH SUT RGNRT REGENETEN (Staple) ×1 IMPLANT
ANCHOR JUGGERKNOT WTAP NDL 2.9 (Anchor) ×2 IMPLANT
ANCHOR SUT QUATTRO KNTLS 4.5 (Anchor) ×1 IMPLANT
ANCHOR SUT W/ ORTHOCORD (Anchor) ×1 IMPLANT
ANCHOR TENDON REGENETEN (Staple) ×1 IMPLANT
ANCHORS BONE REGENETEN (Anchor) ×1 IMPLANT
BIT DRILL JUGRKNT W/NDL BIT2.9 (DRILL) IMPLANT
BLADE FULL RADIUS 3.5 (BLADE) ×2 IMPLANT
BUR ACROMIONIZER 4.0 (BURR) ×2 IMPLANT
CANNULA SHAVER 8MMX76MM (CANNULA) ×3 IMPLANT
CHLORAPREP W/TINT 26ML (MISCELLANEOUS) ×2 IMPLANT
COVER MAYO STAND STRL (DRAPES) ×2 IMPLANT
DRAPE IMP U-DRAPE 54X76 (DRAPES) ×4 IMPLANT
DRILL JUGGERKNOT W/NDL BIT 2.9 (DRILL) ×2
ELECT REM PT RETURN 9FT ADLT (ELECTROSURGICAL) ×2
ELECTRODE REM PT RTRN 9FT ADLT (ELECTROSURGICAL) ×1 IMPLANT
GAUZE PETRO XEROFOAM 1X8 (MISCELLANEOUS) ×2 IMPLANT
GAUZE SPONGE 4X4 12PLY STRL (GAUZE/BANDAGES/DRESSINGS) ×2 IMPLANT
GLOVE BIO SURGEON STRL SZ7.5 (GLOVE) ×4 IMPLANT
GLOVE BIO SURGEON STRL SZ8 (GLOVE) ×4 IMPLANT
GLOVE BIOGEL PI IND STRL 8 (GLOVE) ×1 IMPLANT
GLOVE BIOGEL PI INDICATOR 8 (GLOVE) ×1
GLOVE INDICATOR 8.0 STRL GRN (GLOVE) ×2 IMPLANT
GOWN STRL REUS W/ TWL LRG LVL3 (GOWN DISPOSABLE) ×1 IMPLANT
GOWN STRL REUS W/ TWL XL LVL3 (GOWN DISPOSABLE) ×1 IMPLANT
GOWN STRL REUS W/TWL LRG LVL3 (GOWN DISPOSABLE) ×2
GOWN STRL REUS W/TWL XL LVL3 (GOWN DISPOSABLE) ×2
GRASPER SUT 15 45D LOW PRO (SUTURE) ×1 IMPLANT
IMPLANT REGENETEN MEDIUM (Shoulder) ×1 IMPLANT
IV LACTATED RINGER IRRG 3000ML (IV SOLUTION) ×4
IV LR IRRIG 3000ML ARTHROMATIC (IV SOLUTION) ×2 IMPLANT
MANIFOLD NEPTUNE II (INSTRUMENTS) ×2 IMPLANT
MASK FACE SPIDER DISP (MASK) ×2 IMPLANT
MAT BLUE FLOOR 46X72 FLO (MISCELLANEOUS) ×2 IMPLANT
PACK ARTHROSCOPY SHOULDER (MISCELLANEOUS) ×2 IMPLANT
SLING ARM LRG DEEP (SOFTGOODS) ×1 IMPLANT
SLING ULTRA II LG (MISCELLANEOUS) ×2 IMPLANT
STAPLER SKIN PROX 35W (STAPLE) ×2 IMPLANT
STRAP SAFETY 5IN WIDE (MISCELLANEOUS) ×2 IMPLANT
SUT ETHIBOND 0 MO6 C/R (SUTURE) ×2 IMPLANT
SUT VIC AB 2-0 CT1 27 (SUTURE) ×4
SUT VIC AB 2-0 CT1 TAPERPNT 27 (SUTURE) ×2 IMPLANT
TAPE MICROFOAM 4IN (TAPE) ×2 IMPLANT
TUBING ARTHRO INFLOW-ONLY STRL (TUBING) ×2 IMPLANT
TUBING CONNECTING 10 (TUBING) ×2 IMPLANT
WAND HAND CNTRL MULTIVAC 90 (MISCELLANEOUS) ×2 IMPLANT

## 2018-05-26 NOTE — Anesthesia Post-op Follow-up Note (Signed)
Anesthesia QCDR form completed.        

## 2018-05-26 NOTE — Anesthesia Procedure Notes (Signed)
Procedure Name: Intubation Date/Time: 05/26/2018 1:22 PM Performed by: Eben Burow, CRNA Pre-anesthesia Checklist: Patient identified, Emergency Drugs available, Suction available, Patient being monitored and Timeout performed Patient Re-evaluated:Patient Re-evaluated prior to induction Oxygen Delivery Method: Circle system utilized Preoxygenation: Pre-oxygenation with 100% oxygen Induction Type: IV induction Ventilation: Mask ventilation without difficulty Laryngoscope Size: Miller and 2 Grade View: Grade I Tube type: Oral Tube size: 7.5 mm Number of attempts: 1 Airway Equipment and Method: Stylet and LTA kit utilized Placement Confirmation: ETT inserted through vocal cords under direct vision,  positive ETCO2 and breath sounds checked- equal and bilateral Secured at: 21 cm Tube secured with: Tape Dental Injury: Teeth and Oropharynx as per pre-operative assessment

## 2018-05-26 NOTE — Anesthesia Preprocedure Evaluation (Signed)
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   Neuro/Psych    GI/Hepatic neg GERD  ,  Endo/Other    Renal/GU      Musculoskeletal  (+) Arthritis ,   Abdominal   Peds  Hematology   Anesthesia Other Findings Past Medical History: No date: Arthritis 2010: Bone spur     Comment:  right shoulder No date: Hyperlipidemia No date: Hypertension No date: IBS (irritable bowel syndrome) Past Surgical History: No date: BACK SURGERY     Comment:  Lumbar No date: BUNIONECTOMY 2004: [place] 02/11: INGUINAL HERNIA REPAIR 07/11: ROTATOR CUFF REPAIR     Comment:  left/ bone spur Dr.Blackman 2003-2005: SKIN CANCER EXCISION     Comment:  right shoulder BMI    Body Mass Index:  21.72 kg/m     Reproductive/Obstetrics                             Anesthesia Physical Anesthesia Plan  ASA: II  Anesthesia Plan: General ETT   Post-op Pain Management:  Regional for Post-op pain   Induction:   PONV Risk Score and Plan: Ondansetron and Treatment Daugherty vary due to age or medical condition  Airway Management Planned:   Additional Equipment:   Intra-op Plan:   Post-operative Plan:   Informed Consent: I have reviewed the patients History and Physical, chart, labs and discussed the procedure including the risks, benefits and alternatives for the proposed anesthesia with the patient or authorized representative who has indicated his/her understanding and acceptance.   Dental Advisory Given  Plan Discussed with: CRNA  Anesthesia Plan Comments:          Anesthesia Quick Evaluation

## 2018-05-26 NOTE — Anesthesia Procedure Notes (Signed)
Anesthesia Regional Block: Interscalene brachial plexus block   Pre-Anesthetic Checklist: ,, timeout performed, Correct Patient, Correct Site, Correct Laterality, Correct Procedure, Correct Position, site marked, Risks and benefits discussed,  Surgical consent,  Pre-op evaluation,  At surgeon's request and post-op pain management  Laterality: Right  Prep: chloraprep       Needles:   Needle Type: Stimulator Needle - 80     Needle Length: 10cm  Needle Gauge: 20   Needle insertion depth: 6 cm   Additional Needles:   Procedures: Doppler guided,,,, ultrasound used (permanent image in chart), intact distal pulses,,,  Narrative:  Start time: 05/26/2018 12:28 PM End time: 05/26/2018 12:35 PM Injection made incrementally with aspirations every 5 mL.  Performed by: Personally  Anesthesiologist: Alphonsus Sias, MD  Additional Notes: Did well w block placement, O2 and sedation

## 2018-05-26 NOTE — Transfer of Care (Signed)
Immediate Anesthesia Transfer of Care Note  Patient: [name]  Procedure(s) Performed: Procedure(s) with comments: SHOULDER ARTHROSCOPY WITH OPEN ROTATOR CUFF REPAIR (Right) - debridement, decompression  Patient Location: PACU  Anesthesia Type:General  Level of Consciousness: sedated  Airway & Oxygen Therapy: Patient Spontanous Breathing and Patient connected to face mask oxygen  Post-op Assessment: Report given to RN and Post -op Vital signs reviewed and stable  Post vital signs: Reviewed and stable  Last Vitals:  Vitals:   05/26/18 1307 05/26/18 1523  BP: (!) 146/69 (!) 121/55  [name]: (!) 54 60  Resp: 14 10  Temp:  (!) 36.2 C  SpO2: 206% 015%    Complications: No apparent anesthesia complications

## 2018-05-26 NOTE — H&P (Signed)
Paper H&P to be scanned into permanent record. H&P reviewed and patient re-examined. No changes. 

## 2018-05-26 NOTE — Discharge Instructions (Addendum)
Interscalene Nerve Block with Exparel  1.  For your surgery you have received an Interscalene Nerve Block with Exparel. 2. Nerve Blocks affect many types of nerves, including nerves that control movement, pain and normal sensation.  You Cyphers experience feelings such as numbness, tingling, heaviness, weakness or the inability to move your arm or the feeling or sensation that your arm has "fallen asleep". 3. A nerve block with Exparel can last up to 5 days.  Usually the weakness wears off first.  The tingling and heaviness usually wear off next.  Finally you Alemany start to notice pain.  Keep in mind that this Will occur in any order.  Once a nerve block starts to wear off it is usually completely gone within 60 minutes. 4. ISNB Beste cause mild shortness of breath, a hoarse voice, blurry vision, unequal pupils, or drooping of the face on the same side as the nerve block.  These symptoms will usually resolve with the numbness.  Very rarely the procedure itself can cause mild seizures. 5. If needed, your surgeon will give you a prescription for pain medication.  It will take about 60 minutes for the oral pain medication to become fully effective.  So, it is recommended that you start taking this medication before the nerve block first begins to wear off, or when you first begin to feel discomfort. 6. Take your pain medication only as prescribed.  Pain medication can cause sedation and decrease your breathing if you take more than you need for the level of pain that you have. 7. Nausea is a common side effect of many pain medications.  You Katona want to eat something before taking your pain medicine to prevent nausea. 8. After an Interscalene nerve block, you cannot feel pain, pressure or extremes in temperature in the effected arm.  Because your arm is numb it is at an increased risk for injury.  To decrease the possibility of injury, please practice the following:  a. While you are awake change the position of  your arm frequently to prevent too much pressure on any one area for prolonged periods of time. b.  If you have a cast or tight dressing, check the color or your fingers every couple of hours.  Call your surgeon with the appearance of any discoloration (white or blue). c. If you are given a sling to wear before you go home, please wear it  at all times until the block has completely worn off.  Do not get up at night without your sling. d. Please contact [facility] or your surgeon if you do not begin to regain sensation after 7 days from the surgery.  Anesthesia Mcclarty be contacted by calling the Same Day Surgery Department, Mon. through Fri., 6 am to 4 pm at [phone].   e. If you experience any other problems or concerns, please contact your surgeon's office. If you experience severe or prolonged shortness of breath go to the nearest emergency department.Orthopedic discharge instructions:  Keep dressing dry and intact.  Krutz shower after dressing changed on post-op day #4 (Monday).  Cover staples with Band-Aids after drying off. Apply ice frequently to shoulder. Take ibuprofen 600 mg TID with meals for 7-10 days, then as necessary. Take oxycodone as prescribed when needed.  Feigenbaum supplement with ES Tylenol if necessary. Keep shoulder immobilizer on at all times except Montanye remove for bathing purposes. Follow-up in 10-14 days or as scheduled.  AMBULATORY SURGERY  DISCHARGE INSTRUCTIONS   1) The drugs  that you were given will stay in your system until tomorrow so for the next 24 hours you should not:  A) Drive an automobile B) Make any legal decisions C) Drink any alcoholic beverage   2) You Manring resume regular meals tomorrow.  Today it is better to start with liquids and gradually work up to solid foods.  You Charlson eat anything you prefer, but it is better to start with liquids, then soup and crackers, and gradually work up to solid foods.   3) Please notify your doctor immediately if  you have any unusual bleeding, trouble breathing, redness and pain at the surgery site, drainage, fever, or pain not relieved by medication.    4) Additional Instructions:

## 2018-05-26 NOTE — Op Note (Signed)
05/26/2018  3:27 PM  Patient:   [name]  Pre-Op Diagnosis:   Impingement/tendinopathy with rotator cuff tear, right shoulder.  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  Procedure: Limited arthroscopic debridement, arthroscopic subscapularis tendon repair, arthroscopic subacromial decompression, and mini-open rotator cuff repair with Tamala Julian & Nephew Regeneten patch, right shoulder.  Anesthesia: General endotracheal with interscalene block placed preoperatively by the anesthesiologist.  Surgeon:   Pascal Lux, MD  Assistant:   Liberty, RNFA-S  Findings: As above.  There were grade 1 chondromalacial changes involving the central portion of the glenoid, as well as some labral fraying anteriorly and superiorly.  There was a full-thickness tear involving the mid-insertional fibers of the supraspinatus tendon, as well as a second area of near full-thickness tearing involving the superior insertional fibers of the subscapularis tendon.  The biceps tendon was chronically ruptured and retracted.  Complications: None  Fluids:   550 cc  Estimated blood loss: 5 cc  Tourniquet time: None  Drains: None  Closure: Staples   Brief clinical note: The patient is a 77 year old male with a long history of right shoulder pain. The patient's symptoms have progressed despite medications, activity modification, etc. The patient's history and examination are consistent with impingement/tendinopathy with a rotator cuff tear. These findings were confirmed by MRI scan. The patient presents at this time for definitive management of these shoulder symptoms.  Procedure: The patient underwent placement of an interscalene block by the anesthesiologist in the preoperative holding area before being brought into the operating room and lain in the supine position. The patient then underwent general  endotracheal intubation and anesthesia before being repositioned in the beach chair position using the beach chair positioner. The right shoulder and upper extremity were prepped with ChloraPrep solution before being draped sterilely. Preoperative antibiotics were administered. A timeout was performed to confirm the proper surgical site before the expected portal sites and incision site were injected with 0.5% Sensorcaine with epinephrine. A posterior portal was created and the glenohumeral joint thoroughly inspected with the findings as described above. An anterior portal was created using an outside-in technique. The labrum and rotator cuff were further probed, again confirming the above-noted findings. The areas of labral fraying were debrided back to stable margins using the full-radius resector, as were areas of synovitis anteriorly and superiorly. The ArthroCare wand was inserted and used to obtain hemostasis as well as to "anneal" the labrum superiorly and anteriorly.   The exposed portion of the lesser tuberosity was roughened with the full-radius resector down to bleeding bone. A separate superolateral portal was created using an outside in technique, entering the joint through the rotator cuff tear. Using this working portal, the subscapularis tendon was repaired using a single Mytec BioKnotless anchor placed through the anterior portal. The repair was deemed stable both to probing as well as with external rotation of the arm. The instruments were removed from the joint after suctioning the excess fluid.  The camera was repositioned through the posterior portal into the subacromial space. A separate lateral portal was created using an outside-in technique. The 3.5 mm full-radius resector was introduced and used to perform a subtotal bursectomy. The ArthroCare wand was then inserted and used to remove the periosteal tissue off the undersurface of the anterior third of the acromion as well as to recess  the coracoacromial ligament from its attachment along the anterior and lateral margins of the acromion. The 4.0 mm acromionizing bur  was introduced and used to complete the decompression by removing the undersurface of the anterior third of the acromion. The full radius resector was reintroduced to remove any residual bony debris before the ArthroCare wand was reintroduced to obtain hemostasis. The instruments were then removed from the subacromial space after suctioning the excess fluid.  An approximately 4-5 cm incision was made over the anterolateral aspect of the shoulder beginning at the anterolateral corner of the acromion and extending distally in line with the bicipital groove. This incision was carried down through the subcutaneous tissues to expose the deltoid fascia. The raphae between the anterior and middle thirds was identified and this plane developed to provide access into the subacromial space. Additional bursal tissues were debrided sharply using Metzenbaum scissors. The rotator cuff tear was readily identified. The margins were debrided sharply with a #15 blade and the exposed greater tuberosity roughened with a rongeur. The tear was repaired using two Biomet 2.9 mm JuggerKnot anchors. Several #0 Ethibond interrupted sutures were placed in a side-to-side fashion to reapproximate the longitudinal portion of the tear. The sutures from the JuggerKnot anchors were then brought back laterally and secured using a [employer] anchor to create a two-layer closure.  Given the somewhat degenerative and attenuated appearance of the rotator cuff tissue, it is felt best to reinforce this repair with a Sherwood patch. This was secured over the repair using both the appropriate soft tissue and bone staples. An apparent watertight closure was obtained.  The wound was copiously irrigated with sterile saline solution before the deltoid raphae was reapproximated using 2-0 Vicryl  interrupted sutures. The subcutaneous tissues were closed in two layers using 2-0 Vicryl interrupted sutures before the skin was closed using staples. The portal sites also were closed using staples. A sterile bulky dressing was applied to the shoulder before the arm was placed into a shoulder immobilizer. The patient was then awakened, extubated, and returned to the recovery room in satisfactory condition after tolerating the procedure well.

## 2018-05-27 ENCOUNTER — Encounter: Payer: Self-pay | Admitting: Surgery

## 2018-05-27 NOTE — Anesthesia Postprocedure Evaluation (Signed)
Anesthesia Post Note  Patient: [name]  Procedure(s) Performed: SHOULDER ARTHROSCOPY WITH OPEN ROTATOR CUFF REPAIR (Right Shoulder)  Patient location during evaluation: PACU Anesthesia Type: General Level of consciousness: awake and alert Pain management: pain level controlled (Excellent block function) Vital Signs Assessment: post-procedure vital signs reviewed and stable Respiratory status: spontaneous breathing, nonlabored ventilation and respiratory function stable Cardiovascular status: blood pressure returned to baseline and stable Postop Assessment: no apparent nausea or vomiting Anesthetic complications: no     Last Vitals:  Vitals:   05/26/18 1610 05/26/18 1615  BP:  (!) 142/56  Pulse: 74 68  Resp: 16 16  Temp:  36.6 C  SpO2: 97% 100%    Last Pain:  Vitals:   05/26/18 1615  TempSrc: Temporal  PainSc: 0-No pain                 Alphonsus Sias

## 2019-04-27 ENCOUNTER — Encounter: Payer: Self-pay | Admitting: Gastroenterology

## 2020-01-07 ENCOUNTER — Encounter: Payer: Self-pay | Admitting: Emergency Medicine

## 2020-01-07 ENCOUNTER — Inpatient Hospital Stay
Admission: EM | Admit: 2020-01-07 | Discharge: 2020-01-08 | DRG: 281 | Disposition: A | Discharge status: Other Acute Inpatient Hospital | Payer: Medicare PPO | Attending: Family Medicine | Admitting: Family Medicine

## 2020-01-07 ENCOUNTER — Emergency Department: Payer: Medicare PPO

## 2020-01-07 ENCOUNTER — Other Ambulatory Visit: Payer: Self-pay

## 2020-01-07 DIAGNOSIS — Z8249 Family history of ischemic heart disease and other diseases of the circulatory system: Secondary | ICD-10-CM | POA: Diagnosis not present

## 2020-01-07 DIAGNOSIS — R0602 Shortness of breath: Secondary | ICD-10-CM

## 2020-01-07 DIAGNOSIS — Z8719 Personal history of other diseases of the digestive system: Secondary | ICD-10-CM

## 2020-01-07 DIAGNOSIS — R079 Chest pain, unspecified: Secondary | ICD-10-CM | POA: Diagnosis present

## 2020-01-07 DIAGNOSIS — E785 Hyperlipidemia, unspecified: Secondary | ICD-10-CM | POA: Diagnosis present

## 2020-01-07 DIAGNOSIS — R778 Other specified abnormalities of plasma proteins: Secondary | ICD-10-CM

## 2020-01-07 DIAGNOSIS — J90 Pleural effusion, not elsewhere classified: Secondary | ICD-10-CM | POA: Diagnosis present

## 2020-01-07 DIAGNOSIS — Z79899 Other long term (current) drug therapy: Secondary | ICD-10-CM

## 2020-01-07 DIAGNOSIS — K589 Irritable bowel syndrome without diarrhea: Secondary | ICD-10-CM | POA: Diagnosis present

## 2020-01-07 DIAGNOSIS — I214 Non-ST elevation (NSTEMI) myocardial infarction: Secondary | ICD-10-CM | POA: Diagnosis present

## 2020-01-07 DIAGNOSIS — I2510 Atherosclerotic heart disease of native coronary artery without angina pectoris: Secondary | ICD-10-CM | POA: Diagnosis not present

## 2020-01-07 DIAGNOSIS — Z79891 Long term (current) use of opiate analgesic: Secondary | ICD-10-CM

## 2020-01-07 DIAGNOSIS — I10 Essential (primary) hypertension: Secondary | ICD-10-CM | POA: Diagnosis present

## 2020-01-07 DIAGNOSIS — Z20822 Contact with and (suspected) exposure to covid-19: Secondary | ICD-10-CM | POA: Diagnosis present

## 2020-01-07 DIAGNOSIS — Z880 Allergy status to penicillin: Secondary | ICD-10-CM | POA: Diagnosis not present

## 2020-01-07 DIAGNOSIS — M1990 Unspecified osteoarthritis, unspecified site: Secondary | ICD-10-CM | POA: Diagnosis present

## 2020-01-07 DIAGNOSIS — E871 Hypo-osmolality and hyponatremia: Secondary | ICD-10-CM | POA: Diagnosis present

## 2020-01-07 DIAGNOSIS — I25110 Atherosclerotic heart disease of native coronary artery with unstable angina pectoris: Secondary | ICD-10-CM | POA: Diagnosis not present

## 2020-01-07 DIAGNOSIS — Z888 Allergy status to other drugs, medicaments and biological substances status: Secondary | ICD-10-CM | POA: Diagnosis not present

## 2020-01-07 DIAGNOSIS — E7800 Pure hypercholesterolemia, unspecified: Secondary | ICD-10-CM | POA: Diagnosis not present

## 2020-01-07 DIAGNOSIS — Z85828 Personal history of other malignant neoplasm of skin: Secondary | ICD-10-CM

## 2020-01-07 HISTORY — DX: Non-ST elevation (NSTEMI) myocardial infarction: I21.4

## 2020-01-07 LAB — BASIC METABOLIC PANEL
Anion gap: 11 (ref 5–15)
Anion gap: 10 (ref 5–15)
Anion gap: 7 (ref 5–15)
BUN: 10 mg/dL (ref 8–23)
BUN: 9 mg/dL (ref 8–23)
BUN: 10 mg/dL (ref 8–23)
CO2: 25 mmol/L (ref 22–32)
CO2: 25 mmol/L (ref 22–32)
CO2: 21 mmol/L — ABNORMAL LOW (ref 22–32)
Calcium: 9 mg/dL (ref 8.9–10.3)
Calcium: 8.6 mg/dL — ABNORMAL LOW (ref 8.9–10.3)
Calcium: 6.7 mg/dL — ABNORMAL LOW (ref 8.9–10.3)
Chloride: 90 mmol/L — ABNORMAL LOW (ref 98–111)
Chloride: 96 mmol/L — ABNORMAL LOW (ref 98–111)
Chloride: 107 mmol/L (ref 98–111)
Creatinine, Ser: 0.91 mg/dL (ref 0.61–1.24)
Creatinine, Ser: 0.92 mg/dL (ref 0.61–1.24)
Creatinine, Ser: 0.81 mg/dL (ref 0.61–1.24)
GFR calc Af Amer: >60 mL/min (ref >60)
GFR calc Af Amer: >60 mL/min (ref >60)
GFR calc Af Amer: >60 mL/min (ref >60)
GFR calc non Af Amer: >60 mL/min (ref >60)
GFR calc non Af Amer: >60 mL/min (ref >60)
GFR calc non Af Amer: >60 mL/min (ref >60)
Glucose, Bld: 137 mg/dL — ABNORMAL HIGH (ref 70–99)
Glucose, Bld: 114 mg/dL — ABNORMAL HIGH (ref 70–99)
Glucose, Bld: 119 mg/dL — ABNORMAL HIGH (ref 70–99)
Potassium: 3.9 mmol/L (ref 3.5–5.1)
Potassium: 3.9 mmol/L (ref 3.5–5.1)
Potassium: 3.6 mmol/L (ref 3.5–5.1)
Sodium: 126 mmol/L — ABNORMAL LOW (ref 135–145)
Sodium: 131 mmol/L — ABNORMAL LOW (ref 135–145)
Sodium: 135 mmol/L (ref 135–145)

## 2020-01-07 LAB — CBC
HCT: 40.8 % (ref 39.0–52.0)
Hemoglobin: 14 g/dL (ref 13.0–17.0)
MCH: 29.5 pg (ref 26.0–34.0)
MCHC: 34.3 g/dL (ref 30.0–36.0)
MCV: 86.1 fL (ref 80.0–100.0)
Platelets: 233 10*3/uL (ref 150–400)
RBC: 4.74 MIL/uL (ref 4.22–5.81)
RDW: 12.2 % (ref 11.5–15.5)
WBC: 14.1 10*3/uL — ABNORMAL HIGH (ref 4.0–10.5)
nRBC: 0 % (ref 0.0–0.2)

## 2020-01-07 LAB — TROPONIN I (HIGH SENSITIVITY)
Troponin I (High Sensitivity): 5807 ng/L
Troponin I (High Sensitivity): 5226 ng/L (ref <18)
Troponin I (High Sensitivity): 5679 ng/L (ref <18)
Troponin I (High Sensitivity): 5775 ng/L (ref <18)

## 2020-01-07 LAB — URINE DRUG SCREEN, QUALITATIVE (ARMC ONLY)
Amphetamines, Ur Screen: NOT DETECTED
Barbiturates, Ur Screen: NOT DETECTED
Benzodiazepine, Ur Scrn: NOT DETECTED
Cannabinoid 50 Ng, Ur ~~LOC~~: NOT DETECTED
Cocaine Metabolite,Ur ~~LOC~~: NOT DETECTED
MDMA (Ecstasy)Ur Screen: NOT DETECTED
Methadone Scn, Ur: NOT DETECTED
Opiate, Ur Screen: NOT DETECTED
Phencyclidine (PCP) Ur S: NOT DETECTED
Tricyclic, Ur Screen: NOT DETECTED

## 2020-01-07 LAB — MRSA PCR SCREENING: MRSA by PCR: NEGATIVE

## 2020-01-07 LAB — RESPIRATORY PANEL BY RT PCR (FLU A&B, COVID)
Influenza A by PCR: NEGATIVE
Influenza B by PCR: NEGATIVE
SARS Coronavirus 2 by RT PCR: NEGATIVE

## 2020-01-07 LAB — LIPID PANEL
Cholesterol: 181 mg/dL (ref 0–200)
HDL: 39 mg/dL — ABNORMAL LOW (ref >40)
LDL Cholesterol: 135 mg/dL — ABNORMAL HIGH (ref 0–99)
Total CHOL/HDL Ratio: 4.6 RATIO
Triglycerides: 36 mg/dL (ref <150)
VLDL: 7 mg/dL (ref 0–40)

## 2020-01-07 LAB — SODIUM, URINE, RANDOM: Sodium, Ur: <10 mmol/L

## 2020-01-07 LAB — APTT: aPTT: 31 seconds (ref 24–36)

## 2020-01-07 LAB — OSMOLALITY, URINE: Osmolality, Ur: 73 mOsm/kg — ABNORMAL LOW (ref 300–900)

## 2020-01-07 LAB — PROTIME-INR
INR: 0.9 (ref 0.8–1.2)
Prothrombin Time: 12.4 seconds (ref 11.4–15.2)

## 2020-01-07 LAB — HEPARIN LEVEL (UNFRACTIONATED): Heparin Unfractionated: <0.1 IU/mL — ABNORMAL LOW (ref 0.30–0.70)

## 2020-01-07 LAB — OSMOLALITY: Osmolality: 283 mOsm/kg (ref 275–295)

## 2020-01-07 MED ORDER — METOPROLOL TARTRATE 25 MG PO TABS
25.0000 mg | ORAL_TABLET | Freq: Two times a day (BID) | ORAL | Status: DC
  Administered 2020-01-07 (×2): 25 mg via ORAL
  Filled 2020-01-07 (×2): qty 1

## 2020-01-07 MED ORDER — HYDRALAZINE HCL 25 MG PO TABS: 25.0000 mg | ORAL_TABLET | Freq: Three times a day (TID) | ORAL | Status: DC | PRN

## 2020-01-07 MED ORDER — HEPARIN BOLUS VIA INFUSION
3600.0000 [IU] | Freq: Once | INTRAVENOUS | Status: AC
  Administered 2020-01-07: 13:00:00 3600 [IU] via INTRAVENOUS
  Filled 2020-01-07: qty 3600

## 2020-01-07 MED ORDER — ASPIRIN 81 MG PO CHEW: 324.0000 mg | CHEWABLE_TABLET | Freq: Every day | ORAL | Status: DC

## 2020-01-07 MED ORDER — IOHEXOL 350 MG/ML SOLN
75.0000 mL | Freq: Once | INTRAVENOUS | Status: AC | PRN
  Administered 2020-01-07: 11:00:00 75 mL via INTRAVENOUS

## 2020-01-07 MED ORDER — ONDANSETRON HCL 4 MG/2ML IJ SOLN: 4.0000 mg | Freq: Three times a day (TID) | INTRAMUSCULAR | Status: DC | PRN

## 2020-01-07 MED ORDER — SODIUM CHLORIDE 0.9% FLUSH
3.0000 mL | Freq: Two times a day (BID) | INTRAVENOUS | Status: DC
  Administered 2020-01-08: 3 mL via INTRAVENOUS

## 2020-01-07 MED ORDER — COQ10 200 MG PO CAPS: 200.0000 mg | ORAL_CAPSULE | Freq: Every day | ORAL | Status: DC

## 2020-01-07 MED ORDER — OMEGA-3-ACID ETHYL ESTERS 1 G PO CAPS: 1.0000 g | ORAL_CAPSULE | Freq: Every day | ORAL | Status: DC

## 2020-01-07 MED ORDER — MORPHINE SULFATE (PF) 2 MG/ML IV SOLN: 1.0000 mg | INTRAVENOUS | Status: DC | PRN

## 2020-01-07 MED ORDER — PRAVASTATIN SODIUM 40 MG PO TABS: 40.0000 mg | ORAL_TABLET | Freq: Every day | ORAL | Status: DC

## 2020-01-07 MED ORDER — ASPIRIN 81 MG PO CHEW
81.0000 mg | CHEWABLE_TABLET | Freq: Every day | ORAL | Status: DC
  Administered 2020-01-08: 81 mg via ORAL

## 2020-01-07 MED ORDER — NITROGLYCERIN 0.4 MG SL SUBL: 0.4000 mg | SUBLINGUAL_TABLET | SUBLINGUAL | Status: DC | PRN

## 2020-01-07 MED ORDER — SODIUM CHLORIDE 0.9% FLUSH: 3.0000 mL | INTRAVENOUS | Status: DC | PRN

## 2020-01-07 MED ORDER — HEPARIN BOLUS VIA INFUSION
2000.0000 [IU] | Freq: Once | INTRAVENOUS | Status: AC
  Administered 2020-01-07: 22:00:00 2000 [IU] via INTRAVENOUS
  Filled 2020-01-07: qty 2000

## 2020-01-07 MED ORDER — SODIUM CHLORIDE 0.9% FLUSH: 3.0000 mL | Freq: Once | INTRAVENOUS | Status: DC

## 2020-01-07 MED ORDER — AMLODIPINE BESYLATE 5 MG PO TABS: 5.0000 mg | ORAL_TABLET | Freq: Every day | ORAL | Status: DC

## 2020-01-07 MED ORDER — VANCOMYCIN HCL 1250 MG/250ML IV SOLN
1250.0000 mg | INTRAVENOUS | Status: DC
  Filled 2020-01-07: qty 250

## 2020-01-07 MED ORDER — SODIUM CHLORIDE 0.9 % IV SOLN
2.0000 g | Freq: Two times a day (BID) | INTRAVENOUS | Status: DC
  Administered 2020-01-07 – 2020-01-08 (×2): 2 g via INTRAVENOUS
  Filled 2020-01-07 (×3): qty 2

## 2020-01-07 MED ORDER — VITAMIN E 180 MG (400 UNIT) PO CAPS
400.0000 [IU] | ORAL_CAPSULE | Freq: Every day | ORAL | Status: DC
  Filled 2020-01-07: qty 1

## 2020-01-07 MED ORDER — SODIUM CHLORIDE 0.9 % IV SOLN: INTRAVENOUS | Status: DC

## 2020-01-07 MED ORDER — VANCOMYCIN HCL 1500 MG/300ML IV SOLN
1500.0000 mg | Freq: Once | INTRAVENOUS | Status: AC
  Administered 2020-01-07: 16:00:00 1500 mg via INTRAVENOUS
  Filled 2020-01-07: qty 300

## 2020-01-07 MED ORDER — HEPARIN (PORCINE) 25000 UT/250ML-% IV SOLN
1050.0000 [IU]/h | INTRAVENOUS | Status: DC
  Administered 2020-01-07: 700 [IU]/h via INTRAVENOUS
  Filled 2020-01-07 (×2): qty 250

## 2020-01-07 MED ORDER — PRAVASTATIN SODIUM 40 MG PO TABS
40.0000 mg | ORAL_TABLET | Freq: Every day | ORAL | Status: DC
  Filled 2020-01-07: qty 1

## 2020-01-07 MED ORDER — SODIUM CHLORIDE 0.9 % IV SOLN: 250.0000 mL | INTRAVENOUS | Status: DC | PRN

## 2020-01-07 MED ORDER — PRAVASTATIN SODIUM 20 MG PO TABS
20.0000 mg | ORAL_TABLET | Freq: Every day | ORAL | Status: DC
  Filled 2020-01-07: qty 1

## 2020-01-07 MED ORDER — ACETAMINOPHEN 325 MG PO TABS
650.0000 mg | ORAL_TABLET | Freq: Four times a day (QID) | ORAL | Status: DC | PRN
  Administered 2020-01-08: 03:00:00 650 mg via ORAL
  Filled 2020-01-07: qty 2

## 2020-01-07 MED ORDER — MAGNESIUM OXIDE 400 (241.3 MG) MG PO TABS: 400.0000 mg | ORAL_TABLET | Freq: Every day | ORAL | Status: DC

## 2020-01-07 MED ORDER — ASPIRIN 81 MG PO CHEW
324.0000 mg | CHEWABLE_TABLET | Freq: Once | ORAL | Status: AC
  Administered 2020-01-07: 12:00:00 324 mg via ORAL
  Filled 2020-01-07: qty 4

## 2020-01-07 NOTE — Progress Notes (Signed)
PHARMACIST - PHYSICIAN ORDER COMMUNICATION  CONCERNING: P&T Medication Policy on Herbal Medications  DESCRIPTION:  This patient's order for:  CoQ 10  has been noted.  This product(s) is classified as an "herbal" or natural product. Due to a lack of definitive safety studies or FDA approval, nonstandard manufacturing practices, plus the potential risk of unknown drug-drug interactions while on inpatient medications, the Pharmacy and Therapeutics Committee does not permit the use of "herbal" or natural products of this type within [facility].   ACTION TAKEN: The pharmacy department is unable to verify this order at this time and your patient has been informed of this safety policy. Please reevaluate patient's clinical condition at discharge and address if the herbal or natural product(s) should be resumed at that time.  Dorena Bodo, PharmD

## 2020-01-07 NOTE — Consult Note (Signed)
ANTICOAGULATION CONSULT NOTE - Initial Consult  Pharmacy Consult for Heparin infusion Indication: chest pain/ACS  Allergies  Allergen Reactions  . Crestor [Rosuvastatin] Other (See Comments)    Severe myalgias  . Penicillins Rash  . Atorvastatin Other (See Comments)    ache  . Simvastatin Other (See Comments)    aching    Patient Measurements: Height: 5\' 7"  (170.2 cm) Weight: 135 lb (61.2 kg) IBW/kg (Calculated) : 66.1 Heparin Dosing Weight: 61.2 kg  Vital Signs: Temp: 97.6 F (36.4 C) (01/17 1018) Temp Source: Oral (01/17 1018) BP: 130/66 (01/17 1142) Pulse Rate: 81 (01/17 1142)  Labs: Recent Labs    01/07/20 1030  HGB 14.0  HCT 40.8  PLT 233  CREATININE 0.91  TROPONINIHS 5,807*    Estimated Creatinine Clearance: 57.9 mL/min (by C-G formula based on SCr of 0.91 mg/dL).   Medical History: Past Medical History:  Diagnosis Date  . Arthritis   . Bone spur 2010   right shoulder  . Hyperlipidemia   . Hypertension   . IBS (irritable bowel syndrome)     Medications:  No anticoagulation prior to admission  Assessment: Patient is a 79 y/o M who presented to [facility] ED 1/17 with chief complaint of midsternal chest pain and shortness of breath. Troponin elevated to 5807. EKG pending interpretation. CTA negative for PE. Pharmacy has been consulted to start heparin infusion for acute coronary syndrome.   Baseline H&H and platelets within normal limits. Baseline aPTT 31, INR 0.9.   Goal of Therapy:  Heparin level 0.3-0.7 units/ml Monitor platelets by anticoagulation protocol: Yes   Plan:  -Heparin 3600 unit IV bolus x 1 followed by continuous infusion at 700 units/hr -Heparin level 8 hours after initiation of infusion -Daily CBC per protocol  Crowder Resident 01/07/2020,11:57 AM

## 2020-01-07 NOTE — ED Notes (Signed)
Pt given water per his request.

## 2020-01-07 NOTE — Consult Note (Signed)
ANTICOAGULATION CONSULT NOTE - Initial Consult  Pharmacy Consult for Heparin infusion Indication: chest pain/ACS  Allergies  Allergen Reactions  . Crestor [Rosuvastatin] Other (See Comments)    Severe myalgias  . Penicillins Rash  . Atorvastatin Other (See Comments)    ache  . Simvastatin Other (See Comments)    aching    Patient Measurements: Height: 5\' 7"  (170.2 cm) Weight: 135 lb (61.2 kg) IBW/kg (Calculated) : 66.1 Heparin Dosing Weight: 61.2 kg  Vital Signs: Temp: 99.1 F (37.3 C) (01/17 1910) Temp Source: Oral (01/17 1910) BP: 132/67 (01/17 1910) Pulse Rate: 77 (01/17 1910)  Labs: Recent Labs    01/07/20 1030 01/07/20 1030 01/07/20 1248 01/07/20 1251 01/07/20 1425 01/07/20 1634 01/07/20 1918 01/07/20 2045  HGB 14.0  --   --   --   --   --   --   --   HCT 40.8  --   --   --   --   --   --   --   PLT 233  --   --   --   --   --   --   --   APTT  --   --   --  31  --   --   --   --   LABPROT  --   --   --  12.4  --   --   --   --   INR  --   --   --  0.9  --   --   --   --   HEPARINUNFRC  --   --   --   --   --   --   --  <0.10*  CREATININE 0.91  --   --   --  0.92  --   --   --   TROPONINIHS 5,807*   < > 5,226*  --   --  5,679* 5,775*  --    < > = values in this interval not displayed.    Estimated Creatinine Clearance: 57.3 mL/min (by C-G formula based on SCr of 0.92 mg/dL).   Medical History: Past Medical History:  Diagnosis Date  . Arthritis   . Bone spur 2010   right shoulder  . Hyperlipidemia   . Hypertension   . IBS (irritable bowel syndrome)     Medications:  No anticoagulation prior to admission  Assessment: Patient is a 79 y/o M who presented to [facility] ED 1/17 with chief complaint of midsternal chest pain and shortness of breath. Troponin elevated to 5807. EKG pending interpretation. CTA negative for PE. Pharmacy has been consulted to start heparin infusion for acute coronary syndrome.   Baseline H&H and platelets within normal  limits. Baseline aPTT 31, INR 0.9.   Goal of Therapy:  Heparin level 0.3-0.7 units/ml Monitor platelets by anticoagulation protocol: Yes   Plan:  01/17 @ 2100 HL < 0.10 subtherapeutic. Will bolus heparin 2000 units IV x 1 and will increase rate to 900 units/hr and recheck HL at 0600, will f/u CBC w/ am labs.  Tobie Lords, PharmD, BCPS Clinical Pharmacist 01/07/2020,10:08 PM

## 2020-01-07 NOTE — ED Notes (Signed)
Cardiology at bedside at this time

## 2020-01-07 NOTE — ED Notes (Signed)
Patient transported to CT 

## 2020-01-07 NOTE — ED Notes (Signed)
Pt resting in bed at this time. NAD noted. VSS. Pt visualized in NAD.

## 2020-01-07 NOTE — ED Notes (Signed)
Attempted to call report x 1  

## 2020-01-07 NOTE — Plan of Care (Signed)
  Problem: Clinical Measurements: Goal: Respiratory complications will improve Outcome: Progressing   Problem: Pain Managment: Goal: General experience of comfort will improve Outcome: Progressing   Problem: Safety: Goal: Ability to remain free from injury will improve Outcome: Progressing   Problem: Cardiac: Goal: Ability to achieve and maintain adequate cardiovascular perfusion will improve Outcome: Progressing

## 2020-01-07 NOTE — ED Provider Notes (Signed)
[facility] Emergency Department Provider Note   ____________________________________________   I have reviewed the triage vital signs and the nursing notes.   HISTORY  Chief Complaint Shortness of Breath   History limited by: Not Limited   HPI [name] is a 79 y.o. male who presents to the emergency department today because of concerns for shortness of breath.  Patient states that it started 2 days ago.  He thinks he might of overexerted himself as he was trying to get some work done.  Since that time though he is continued to have shortness of breath.  It is worse with exertion.  He has had some associated cough and has coughed up some blood.  Patient denies any significant chest pain but has had some chest discomfort.  Patient states he has a history of bronchitis.  Denies any other chronic lung disease.  Patient denies any fevers.  Denies any nausea or vomiting.  Records reviewed. Per medical record review patient has a history of HTN, HLD  Past Medical History:  Diagnosis Date  . Arthritis   . Bone spur 2010   right shoulder  . Hyperlipidemia   . Hypertension   . IBS (irritable bowel syndrome)     Patient Active Problem List   Diagnosis Date Noted  . Advance directive discussed with patient 05/25/2016  . Routine general medical examination at a health care facility 04/05/2012  . IRRITABLE BOWEL SYNDROME 07/11/2010  . DIVERTICULOSIS-COLON 12/06/2009  . HEPATIC CYST 12/06/2009  . 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 Hyperlipemia 03/03/2007  . Essential hypertension, benign 03/03/2007  . OSTEOARTHRITIS 03/03/2007    Past Surgical History:  Procedure Laterality Date  . BACK SURGERY     Lumbar  . BUNIONECTOMY    . HEMORRHOID SURGERY  2004  . INGUINAL HERNIA REPAIR  02/11  . ROTATOR CUFF REPAIR  07/11   left/ bone spur Dr.Blackman  . SHOULDER ARTHROSCOPY WITH OPEN ROTATOR CUFF REPAIR Right 05/26/2018   Procedure: SHOULDER ARTHROSCOPY WITH  OPEN ROTATOR CUFF REPAIR;  Surgeon: Corky Mull, MD;  Location: [facility] ORS;  Service: Orthopedics;  Laterality: Right;  debridement, decompression  . SKIN CANCER EXCISION  2003-2005   right shoulder    Prior to Admission medications   Medication Sig Start Date End Date Taking? Authorizing Provider  acetaminophen (TYLENOL) 650 MG CR tablet Take 650 mg by mouth every 8 (eight) hours as needed for pain.    [provider]  amLODipine (NORVASC) 2.5 MG tablet Take 2.5 mg by mouth daily.    [provider]  Coenzyme Q10 (COQ10) 200 MG CAPS Take 200 mg by mouth daily.    [provider]  hyoscyamine (LEVSIN SL) 0.125 MG SL tablet dissolve 1 tablet under the tongue twice a day if needed for cramping Patient taking differently: Take 0.125 mg by mouth 2 (two) times daily as needed for cramping.  06/22/16   Venia Carbon, MD  Lidocaine 4 % GEL Apply 1 application topically 2 (two) times daily as needed (for pain.).    [provider]  lovastatin (MEVACOR) 20 MG tablet Take 20 mg by mouth every morning.     [provider]  magnesium oxide (MAG-OX) 400 MG tablet Take 400 mg by mouth daily.    [provider]  oxyCODONE (ROXICODONE) 5 MG immediate release tablet Take 1-2 tablets (5-10 mg total) by mouth every 4 (four) hours as needed. 05/26/18   Poggi, Marshall Cork, MD  valACYclovir (  VALTREX) 1000 MG tablet Take 1,000 mg by mouth daily as needed (for cold sores/fever blisters.).     [provider]  vitamin E 400 UNIT capsule Take 400 Units by mouth daily.    [provider]    Allergies Crestor [rosuvastatin], Penicillins, Atorvastatin, and Simvastatin  Family History  Problem Relation Age of Onset  . Cancer Neg Hx   . Diabetes Neg Hx     Social History Social History   Tobacco Use  . Smoking status: Never Smoker  . Smokeless tobacco: Never Used  Substance Use Topics  . Alcohol use: Yes    Alcohol/week: 1.0 standard drinks     Types: 1 Glasses of wine per week  . Drug use: No    Review of Systems Constitutional: No fever/chills Eyes: No visual changes. ENT: No sore throat. Cardiovascular: Positive for chest discomfort.  Respiratory: Positive for shortness of breath. Gastrointestinal: No abdominal pain.  No nausea, no vomiting.  No diarrhea.   Genitourinary: Negative for dysuria. Musculoskeletal: Negative for back pain. Skin: Negative for rash. Neurological: Negative for headaches, focal weakness or numbness.  ____________________________________________   PHYSICAL EXAM:  VITAL SIGNS: ED Triage Vitals  Enc Vitals Group     BP 01/07/20 1018 136/76     Pulse Rate 01/07/20 1015 79     Resp 01/07/20 1015 (!) 26     Temp 01/07/20 1018 97.6 F (36.4 C)     Temp Source 01/07/20 1018 Oral     SpO2 01/07/20 1015 94 %     Weight 01/07/20 1015 135 lb (61.2 kg)     Height 01/07/20 1015 5\' 7"  (1.702 m)     Head Circumference --      Peak Flow --      Pain Score 01/07/20 1015 0   Constitutional: Alert and oriented.  Eyes: Conjunctivae are normal.  ENT      Head: Normocephalic and atraumatic.      Nose: No congestion/rhinnorhea.      Mouth/Throat: Mucous membranes are moist.      Neck: No stridor. Hematological/Lymphatic/Immunilogical: No cervical lymphadenopathy. Cardiovascular: Normal rate, regular rhythm.  No murmurs, rubs, or gallops. Respiratory: Normal respiratory effort without tachypnea nor retractions. Breath sounds are clear and equal bilaterally. No wheezes/rales/rhonchi. Gastrointestinal: Soft and non tender. No rebound. No guarding.  Genitourinary: Deferred Musculoskeletal: Normal range of motion in all extremities. No lower extremity edema. Neurologic:  Normal speech and language. No gross focal neurologic deficits are appreciated.  Skin:  Skin is warm, dry and intact. No rash noted. Psychiatric: Mood and affect are normal. Speech and behavior are normal. Patient exhibits appropriate  insight and judgment.  ____________________________________________    LABS (pertinent positives/negatives)  CBC wbc 14.1, hgb 14.0, plt 233 Trop hs 5807 CBC wbc 14.1, hgb 14.0, plt 233 BMP na 126, cl 90, glu 137 ____________________________________________   EKG  I, Nance Pear, attending physician, personally viewed and interpreted this EKG  EKG Time: 1011 Rate: 80 Rhythm: normal sinus rhythm Axis: normal Intervals: qtc 445 QRS: narrow, q waves V1, V2 ST changes: no st elevation, question st depression v5, v6 Impression: abnormal ekg, artifact limits read   ____________________________________________    RADIOLOGY  CT angio No PE. Ground glass opacities. Bilateral pleural effusion  CXR Prominent interstitial markings.   ____________________________________________   PROCEDURES  Procedures  CRITICAL CARE Performed by: Nance Pear   Total critical care time: 30 minutes  Critical care time was exclusive of separately billable procedures and treating  other patients.  Critical care was necessary to treat or prevent imminent or life-threatening deterioration.  Critical care was time spent personally by me on the following activities: development of treatment plan with patient and/or surrogate as well as nursing, discussions with consultants, evaluation of patient's response to treatment, examination of patient, obtaining history from patient or surrogate, ordering and performing treatments and interventions, ordering and review of laboratory studies, ordering and review of radiographic studies, pulse oximetry and re-evaluation of patient's condition.  ____________________________________________   INITIAL IMPRESSION / ASSESSMENT AND PLAN / ED COURSE  Pertinent labs & imaging results that were available during my care of the patient were reviewed by me and considered in my medical decision making (see chart for details).   Patient presents to the  emergency department today because of concern for shortness of breath for the past 3 days. On exam patient did appear to have slight increase in work of breathing. Broad work up was initiated. Troponin came back significantly elevated. Do have concern that patient suffered heart attack 3 days ago. Given that patient complained of some hemoptysis CT angio was checked to evaluate for PE. This was negative for pulmonary embolism. Will plan on admission. Discussed findings with the patient.  ____________________________________________   FINAL CLINICAL IMPRESSION(S) / ED DIAGNOSES  Final diagnoses:  Shortness of breath  Elevated troponin  Hyponatremia  Pleural effusion     Note: This dictation was prepared with Dragon dictation. Any transcriptional errors that result from this process are unintentional      Nance Pear, MD 01/07/20 1422

## 2020-01-07 NOTE — Progress Notes (Addendum)
Pt was admitted with no signs of distress and no complaints of pain. Pt was alert and oriented x 4. Ascom was within pt reached and bed alarm was utilized. Pt requested to keep wallet with him at this time. Notify incoming shift.Will continue to monitor.  Update 0710: Chewable aspirin was not given for pre-cath. Notify incoming shift.

## 2020-01-07 NOTE — ED Triage Notes (Signed)
Pt here for [facility] since Friday. Pt labored at check in. Denies pain at this time.  No fever. Has had blood in sputum per pt.

## 2020-01-07 NOTE — ED Notes (Signed)
2A called and notified that patient en route. Pt transported to 2A by [employer], Therapist, sports.

## 2020-01-07 NOTE — ED Notes (Signed)
Patient to ED bed 7 via traige.  C/o midsternal chest pain since yesterday at 1700.  It was brought on while digging a ditch.  Denies any other associated symptoms. Denies radiation.  Pain has been constant and unchanged since its onset yesterday.

## 2020-01-07 NOTE — Consult Note (Addendum)
Cardiology Consultation:   Patient ID: [name] MRN: [id number]; DOB: Dec 26, 1940  Admit date: 01/07/2020 Date of Consult: 01/07/2020  Primary Care Provider: Venia Carbon, MD Primary Cardiologist: [place] rounding Primary Electrophysiologist:  None    Patient Profile:   [name] Un is a 79 y.o. male with a hx of hypertension, hyperlipidemia who is being seen today for the evaluation of chest pain, elevated troponins at the request of Dr. Blaine Hamper.  History of Present Illness:   Mr. [name] is a 79 year old male with history of hypertension and hyperlipidemia who presents due to chest pain and shortness of breath.  Symptoms of shortness of breath have been going on and off for the past 5 days.  He has been digging a ditch at his home and felt like he overdid it the past 3 days or so.  2 days ago while he was struggling to finish taking, he felt shortness of breath towards the end.  Later on that night at about 2 AM in the morning, he woke up with chest heaviness which he rates as a 5 out of 10.  He took a baby aspirin with some improvement in symptoms and later on went to sleep.  Yesterday similar symptoms occurred at about 2 AM.  He attributed it to indigestion and later went to sleep.  Today due to persistent shortness of breath and chest discomfort the prior days, he went to the local fire department where his vitals were checked.  Upon hearing his symptoms, he was recommended to come to the emergency room.  He drove himself to the emergency room.  He denies any personal history of heart disease but states heart disease runs in his family.  His younger brother had bypass at age 37.  His father had a heart attack in his 54s.  In the ED upon arrival, chest CTA did not reveal any pulmonary embolus.  EKG showed sinus rhythm with anterolateral ST depressions.  Initial troponin was 5807.  Patient was given 324 aspirin and started on heparin drip per ACS protocol.  Upon my exam, patient  denies symptoms of chest discomfort.  Heart Pathway Score:     Past Medical History:  Diagnosis Date  . Arthritis   . Bone spur 2010   right shoulder  . Hyperlipidemia   . Hypertension   . IBS (irritable bowel syndrome)     Past Surgical History:  Procedure Laterality Date  . BACK SURGERY     Lumbar  . BUNIONECTOMY    . HEMORRHOID SURGERY  2004  . INGUINAL HERNIA REPAIR  02/11  . ROTATOR CUFF REPAIR  07/11   left/ bone spur Dr.Blackman  . SHOULDER ARTHROSCOPY WITH OPEN ROTATOR CUFF REPAIR Right 05/26/2018   Procedure: SHOULDER ARTHROSCOPY WITH OPEN ROTATOR CUFF REPAIR;  Surgeon: Corky Mull, MD;  Location: [facility] ORS;  Service: Orthopedics;  Laterality: Right;  debridement, decompression  . 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 Authorizing Provider  acetaminophen (TYLENOL) 650 MG CR tablet Take 650 mg by mouth every 8 (eight) hours as needed for pain.    [provider]  amLODipine (NORVASC) 5 MG tablet Take 5 mg by mouth daily. 12/05/19   [provider]  Coenzyme Q10 (COQ10) 200 MG CAPS Take 200 mg by mouth daily.    [provider]  lovastatin (MEVACOR) 20 MG tablet  Take 20 mg by mouth every morning.     [provider]  magnesium oxide (MAG-OX) 400 MG tablet Take 400 mg by mouth daily.    [provider]  valACYclovir (VALTREX) 1000 MG tablet Take 1,000 mg by mouth daily as needed (for cold sores/fever blisters.).     [provider]  vitamin E 400 UNIT capsule Take 400 Units by mouth daily.    [provider]    Inpatient Medications: Scheduled Meds: . [START ON 01/08/2020] aspirin  324 mg Oral Daily  . sodium chloride flush  3 mL Intravenous Once   Continuous Infusions: . sodium chloride    . heparin 700 Units/hr (01/07/20 1256)   PRN Meds: acetaminophen, hydrALAZINE, morphine injection,  nitroGLYCERIN, ondansetron (ZOFRAN) IV  Allergies:    Allergies  Allergen Reactions  . Crestor [Rosuvastatin] Other (See Comments)    Severe myalgias  . Penicillins Rash  . Atorvastatin Other (See Comments)    ache  . Simvastatin Other (See Comments)    aching    Social History:   Social History   Socioeconomic History  . Marital status: Single    Spouse name: Not on file  . Number of children: 2  . Years of education: Not on file  . Highest education level: Not on file  Occupational History  . Occupation: retired Merchandiser, retail: retired  Tobacco Use  . Smoking status: Never Smoker  . Smokeless tobacco: Never Used  Substance and Sexual Activity  . Alcohol use: Yes    Alcohol/week: 1.0 standard drinks    Types: 1 Glasses of wine per week  . Drug use: No  . Sexual activity: Not on file  Other Topics Concern  . Not on file  Social History Narrative   2 sons, no contact   Splits his time between [place] and [place]   Has live-in since 2005   Has living will   Requests Bernice--girlfriend or brother Herbie [place], as health care POA   Would accept resuscitation but no prolonged artificial ventilation   Wouldn't want tube feeds if cognitively unaware   Social Determinants of Health   Financial Resource Strain:   . Difficulty of Paying Living Expenses: Not on file  Food Insecurity:   . Worried About Charity fundraiser in the Last Year: Not on file  . Ran Out of Food in the Last Year: Not on file  Transportation Needs:   . Lack of Transportation (Medical): Not on file  . Lack of Transportation (Non-Medical): Not on file  Physical Activity:   . Days of Exercise per Week: Not on file  . Minutes of Exercise per Session: Not on file  Stress:   . Feeling of Stress : Not on file  Social Connections:   . Frequency of Communication with Friends and Family: Not on file  . Frequency of Social Gatherings with Friends and Family: Not on file  .  Attends Religious Services: Not on file  . Active Member of Clubs or Organizations: Not on file  . Attends Archivist Meetings: Not on file  . Marital Status: Not on file  Intimate Partner Violence:   . Fear of Current or Ex-Partner: Not on file  . Emotionally Abused: Not on file  . Physically Abused: Not on file  . Sexually Abused: Not on file    Family History:   MI in father, history of CABG in brother age 58. Family History  Problem Relation Age of Onset  . Cancer Neg Hx   . Diabetes Neg Hx      ROS:  Please see the history of present illness.   All other ROS reviewed and negative.     Physical Exam/Data:   Vitals:   01/07/20 1245 01/07/20 1258 01/07/20 1300 01/07/20 1330  BP:    113/84  Pulse:  77  93  Resp:  20  19  Temp:      TempSrc:      SpO2: (!) 88% 95% 96% 93%  Weight:      Height:       No intake or output data in the 24 hours ending 01/07/20 1338 Last 3 Weights 01/07/2020 05/26/2018 05/19/2018  Weight (lbs) 135 lb 138 lb 11.2 oz 141 lb  Weight (kg) 61.236 kg 62.914 kg 63.957 kg     Body mass index is 21.14 kg/m.  General:  Well nourished, well developed, in no acute distress HEENT: normal Lymph: no adenopathy Neck: no JVD Endocrine:  No thryomegaly Vascular: No carotid bruits; FA pulses 2+ bilaterally without bruits  Cardiac:  normal S1, S2; RRR; no murmur  Lungs:  clear to auscultation bilaterally, no wheezing, rhonchi or rales  Abd: soft, nontender, no hepatomegaly  Ext: no edema Musculoskeletal:  No deformities, BUE and BLE strength normal and equal Skin: warm and dry  Neuro:  CNs 2-12 intact, no focal abnormalities noted Psych:  Normal affect   EKG:  The EKG was personally reviewed and demonstrates: Normal sinus rhythm, anterolateral ST depressions Telemetry:  Telemetry was personally reviewed and demonstrates: Sinus rhythm  Relevant CV Studies: None  Laboratory Data:  High Sensitivity Troponin:   Recent Labs  Lab  01/07/20 1030 01/07/20 1248  TROPONINIHS 5,807* 5,226*     Chemistry Recent Labs  Lab 01/07/20 1030  NA 126*  K 3.9  CL 90*  CO2 25  GLUCOSE 137*  BUN 10  CREATININE 0.91  CALCIUM 9.0  GFRNONAA >60  GFRAA >60  ANIONGAP 11    No results for input(s): PROT, ALBUMIN, AST, ALT, ALKPHOS, BILITOT in the last 168 hours. Hematology Recent Labs  Lab 01/07/20 1030  WBC 14.1*  RBC 4.74  HGB 14.0  HCT 40.8  MCV 86.1  MCH 29.5  MCHC 34.3  RDW 12.2  PLT 233   BNPNo results for input(s): BNP, PROBNP in the last 168 hours.  DDimer No results for input(s): DDIMER in the last 168 hours.   Radiology/Studies:  CT Angio Chest PE W and/or Wo Contrast  Result Date: 01/07/2020 CLINICAL DATA:  Short of breath. EXAM: CT ANGIOGRAPHY CHEST WITH CONTRAST TECHNIQUE: Multidetector CT imaging of the chest was performed using the standard protocol during bolus administration of intravenous contrast. Multiplanar CT image reconstructions and MIPs were obtained to evaluate the vascular anatomy. CONTRAST:  38mL OMNIPAQUE IOHEXOL 350 MG/ML SOLN COMPARISON:  None. FINDINGS: Cardiovascular: No filling defects within the pulmonary arteries arteries to suggest acute pulmonary embolism. Coronary artery calcification and aortic atherosclerotic calcification. Mediastinum/Nodes: No axillary supraclavicular adenopathy. No discrete enlarged mediastinal lymph nodes. Lungs/Pleura: Large bilateral layering pleural effusions. Mild ground-glass perihilar opacities in the LEFT and RIGHT upper lobe. Mild basilar atelectasis related to the large effusions. Pulmonary infarction. Upper Abdomen: Limited view of the liver, kidneys, pancreas are unremarkable. Normal adrenal glands. Musculoskeletal: No aggressive osseous lesion Review of the MIP images confirms the above findings. IMPRESSION: 1. No acute pulmonary embolism. 2. Large bilateral layering pleural effusions. 3. Ground-glass opacities in the  upper lobes likely represent  mild edema. 4. Coronary artery calcification and Aortic Atherosclerosis (ICD10-I70.0). Electronically Signed   By: Suzy Bouchard M.D.   On: 01/07/2020 11:38   DG Chest Portable 1 View  Result Date: 01/07/2020 CLINICAL DATA:  Shortness of breath, hemoptysis EXAM: PORTABLE CHEST 1 VIEW COMPARISON:  None. FINDINGS: The heart size and mediastinal contours are within normal limits. Prominent interstitial markings throughout both lungs. Small right pleural effusion. No pneumothorax. Degenerative changes of the thoracic spine. IMPRESSION: 1. Prominent interstitial markings throughout both lungs. Findings Kanzler reflect chronic bronchitic type lung changes and/or an underlying chronic interstitial lung disease. 2. Small right pleural effusion. Electronically Signed   By: Davina Poke D.O.   On: 01/07/2020 10:50   {   Assessment and Plan:   1. Chest pain/NSTEMI -Currently asymptomatic -Initial troponins 5807.  Continue trending troponins until peaked -Heparin drip per ACS protocol -Continue with aspirin 81 mg daily, Lipitor 40 mg daily -Initiate low-dose beta-blocker 25 mg twice daily -Obtain lipid panel -Get echocardiogram -Plan for left heart cath tomorrow.  N.p.o. midnight.  2.  History of hypertension -Blood pressure controlled -Beta-blocker as above  3.  History of hyperlipidemia -Patient with significant myalgias with several statins including Crestor, Lipitor, simvastatin -He takes lovastatin at home. -will dose pravachol 40mg  qd  Further recommendations pending cath results, echo results and clinical course.  Thank you for this consult. We will follow with you   Signed, Kate Sable, MD  01/07/2020 1:38 PM

## 2020-01-07 NOTE — H&P (View-Only) (Signed)
Cardiology Consultation:   Patient ID: [name] MRN: [id number]; DOB: March 25, 1941  Admit date: 01/07/2020 Date of Consult: 01/07/2020  Primary Care Provider: Venia Carbon, MD Primary Cardiologist: Parks rounding Primary Electrophysiologist:  None    Patient Profile:   [name] is a 79 y.o. male with a hx of hypertension, hyperlipidemia who is being seen today for the evaluation of chest pain, elevated troponins at the request of Dr. Blaine Hamper.  History of Present Illness:   [name] is a 79 year old male with history of hypertension and hyperlipidemia who presents due to chest pain and shortness of breath.  Symptoms of shortness of breath have been going on and off for the past 5 days.  He has been digging a ditch at his home and felt like he overdid it the past 3 days or so.  2 days ago while he was struggling to finish taking, he felt shortness of breath towards the end.  Later on that night at about 2 AM in the morning, he woke up with chest heaviness which he rates as a 5 out of 10.  He took a baby aspirin with some improvement in symptoms and later on went to sleep.  Yesterday similar symptoms occurred at about 2 AM.  He attributed it to indigestion and later went to sleep.  Today due to persistent shortness of breath and chest discomfort the prior days, he went to the local fire department where his vitals were checked.  Upon hearing his symptoms, he was recommended to come to the emergency room.  He drove himself to the emergency room.  He denies any personal history of heart disease but states heart disease runs in his family.  His younger brother had bypass at age 13.  His father had a heart attack in his 59s.  In the ED upon arrival, chest CTA did not reveal any pulmonary embolus.  EKG showed sinus rhythm with anterolateral ST depressions.  Initial troponin was 5807.  Patient was given 324 aspirin and started on heparin drip per ACS protocol.  Upon my exam, patient  denies symptoms of chest discomfort.  Heart Pathway Score:     Past Medical History:  Diagnosis Date  . Arthritis   . Bone spur 2010   right shoulder  . Hyperlipidemia   . Hypertension   . IBS (irritable bowel syndrome)     Past Surgical History:  Procedure Laterality Date  . BACK SURGERY     Lumbar  . BUNIONECTOMY    . HEMORRHOID SURGERY  2004  . INGUINAL HERNIA REPAIR  02/11  . ROTATOR CUFF REPAIR  07/11   left/ bone spur Dr.Blackman  . SHOULDER ARTHROSCOPY WITH OPEN ROTATOR CUFF REPAIR Right 05/26/2018   Procedure: SHOULDER ARTHROSCOPY WITH OPEN ROTATOR CUFF REPAIR;  Surgeon: Corky Mull, MD;  Location: [facility] ORS;  Service: Orthopedics;  Laterality: Right;  debridement, decompression  . 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 Authorizing Provider  acetaminophen (TYLENOL) 650 MG CR tablet Take 650 mg by mouth every 8 (eight) hours as needed for pain.    [provider]  amLODipine (NORVASC) 5 MG tablet Take 5 mg by mouth daily. 12/05/19   [provider]  Coenzyme Q10 (COQ10) 200 MG CAPS Take 200 mg by mouth daily.    [provider]  lovastatin (MEVACOR) 20 MG tablet  Take 20 mg by mouth every morning.     [provider]  magnesium oxide (MAG-OX) 400 MG tablet Take 400 mg by mouth daily.    [provider]  valACYclovir (VALTREX) 1000 MG tablet Take 1,000 mg by mouth daily as needed (for cold sores/fever blisters.).     [provider]  vitamin E 400 UNIT capsule Take 400 Units by mouth daily.    [provider]    Inpatient Medications: Scheduled Meds: . [START ON 01/08/2020] aspirin  324 mg Oral Daily  . sodium chloride flush  3 mL Intravenous Once   Continuous Infusions: . sodium chloride    . heparin 700 Units/hr (01/07/20 1256)   PRN Meds: acetaminophen, hydrALAZINE, morphine injection,  nitroGLYCERIN, ondansetron (ZOFRAN) IV  Allergies:    Allergies  Allergen Reactions  . Crestor [Rosuvastatin] Other (See Comments)    Severe myalgias  . Penicillins Rash  . Atorvastatin Other (See Comments)    ache  . Simvastatin Other (See Comments)    aching    Social History:   Social History   Socioeconomic History  . Marital status: Single    Spouse name: Not on file  . Number of children: 2  . Years of education: Not on file  . Highest education level: Not on file  Occupational History  . Occupation: retired Merchandiser, retail: retired  Tobacco Use  . Smoking status: Never Smoker  . Smokeless tobacco: Never Used  Substance and Sexual Activity  . Alcohol use: Yes    Alcohol/week: 1.0 standard drinks    Types: 1 Glasses of wine per week  . Drug use: No  . Sexual activity: Not on file  Other Topics Concern  . Not on file  Social History Narrative   2 sons, no contact   Splits his time between [place] and [place]   Has live-in since 2005   Has living will   Requests Bernice--girlfriend or brother Herbie [place], as health care POA   Would accept resuscitation but no prolonged artificial ventilation   Wouldn't want tube feeds if cognitively unaware   Social Determinants of Health   Financial Resource Strain:   . Difficulty of Paying Living Expenses: Not on file  Food Insecurity:   . Worried About Charity fundraiser in the Last Year: Not on file  . Ran Out of Food in the Last Year: Not on file  Transportation Needs:   . Lack of Transportation (Medical): Not on file  . Lack of Transportation (Non-Medical): Not on file  Physical Activity:   . Days of Exercise per Week: Not on file  . Minutes of Exercise per Session: Not on file  Stress:   . Feeling of Stress : Not on file  Social Connections:   . Frequency of Communication with Friends and Family: Not on file  . Frequency of Social Gatherings with Friends and Family: Not on file  .  Attends Religious Services: Not on file  . Active Member of Clubs or Organizations: Not on file  . Attends Archivist Meetings: Not on file  . Marital Status: Not on file  Intimate Partner Violence:   . Fear of Current or Ex-Partner: Not on file  . Emotionally Abused: Not on file  . Physically Abused: Not on file  . Sexually Abused: Not on file    Family History:   MI in father, history of CABG in brother age 87. Family History  Problem Relation Age of Onset  . Cancer Neg Hx   . Diabetes Neg Hx      ROS:  Please see the history of present illness.   All other ROS reviewed and negative.     Physical Exam/Data:   Vitals:   01/07/20 1245 01/07/20 1258 01/07/20 1300 01/07/20 1330  BP:    113/84  Pulse:  77  93  Resp:  20  19  Temp:      TempSrc:      SpO2: (!) 88% 95% 96% 93%  Weight:      Height:       No intake or output data in the 24 hours ending 01/07/20 1338 Last 3 Weights 01/07/2020 05/26/2018 05/19/2018  Weight (lbs) 135 lb 138 lb 11.2 oz 141 lb  Weight (kg) 61.236 kg 62.914 kg 63.957 kg     Body mass index is 21.14 kg/m.  General:  Well nourished, well developed, in no acute distress HEENT: normal Lymph: no adenopathy Neck: no JVD Endocrine:  No thryomegaly Vascular: No carotid bruits; FA pulses 2+ bilaterally without bruits  Cardiac:  normal S1, S2; RRR; no murmur  Lungs:  clear to auscultation bilaterally, no wheezing, rhonchi or rales  Abd: soft, nontender, no hepatomegaly  Ext: no edema Musculoskeletal:  No deformities, BUE and BLE strength normal and equal Skin: warm and dry  Neuro:  CNs 2-12 intact, no focal abnormalities noted Psych:  Normal affect   EKG:  The EKG was personally reviewed and demonstrates: Normal sinus rhythm, anterolateral ST depressions Telemetry:  Telemetry was personally reviewed and demonstrates: Sinus rhythm  Relevant CV Studies: None  Laboratory Data:  High Sensitivity Troponin:   Recent Labs  Lab  01/07/20 1030 01/07/20 1248  TROPONINIHS 5,807* 5,226*     Chemistry Recent Labs  Lab 01/07/20 1030  NA 126*  K 3.9  CL 90*  CO2 25  GLUCOSE 137*  BUN 10  CREATININE 0.91  CALCIUM 9.0  GFRNONAA >60  GFRAA >60  ANIONGAP 11    No results for input(s): PROT, ALBUMIN, AST, ALT, ALKPHOS, BILITOT in the last 168 hours. Hematology Recent Labs  Lab 01/07/20 1030  WBC 14.1*  RBC 4.74  HGB 14.0  HCT 40.8  MCV 86.1  MCH 29.5  MCHC 34.3  RDW 12.2  PLT 233   BNPNo results for input(s): BNP, PROBNP in the last 168 hours.  DDimer No results for input(s): DDIMER in the last 168 hours.   Radiology/Studies:  CT Angio Chest PE W and/or Wo Contrast  Result Date: 01/07/2020 CLINICAL DATA:  Short of breath. EXAM: CT ANGIOGRAPHY CHEST WITH CONTRAST TECHNIQUE: Multidetector CT imaging of the chest was performed using the standard protocol during bolus administration of intravenous contrast. Multiplanar CT image reconstructions and MIPs were obtained to evaluate the vascular anatomy. CONTRAST:  22mL OMNIPAQUE IOHEXOL 350 MG/ML SOLN COMPARISON:  None. FINDINGS: Cardiovascular: No filling defects within the pulmonary arteries arteries to suggest acute pulmonary embolism. Coronary artery calcification and aortic atherosclerotic calcification. Mediastinum/Nodes: No axillary supraclavicular adenopathy. No discrete enlarged mediastinal lymph nodes. Lungs/Pleura: Large bilateral layering pleural effusions. Mild ground-glass perihilar opacities in the LEFT and RIGHT upper lobe. Mild basilar atelectasis related to the large effusions. Pulmonary infarction. Upper Abdomen: Limited view of the liver, kidneys, pancreas are unremarkable. Normal adrenal glands. Musculoskeletal: No aggressive osseous lesion Review of the MIP images confirms the above findings. IMPRESSION: 1. No acute pulmonary embolism. 2. Large bilateral layering pleural effusions. 3. Ground-glass opacities in the  upper lobes likely represent  mild edema. 4. Coronary artery calcification and Aortic Atherosclerosis (ICD10-I70.0). Electronically Signed   By: Suzy Bouchard M.D.   On: 01/07/2020 11:38   DG Chest Portable 1 View  Result Date: 01/07/2020 CLINICAL DATA:  Shortness of breath, hemoptysis EXAM: PORTABLE CHEST 1 VIEW COMPARISON:  None. FINDINGS: The heart size and mediastinal contours are within normal limits. Prominent interstitial markings throughout both lungs. Small right pleural effusion. No pneumothorax. Degenerative changes of the thoracic spine. IMPRESSION: 1. Prominent interstitial markings throughout both lungs. Findings Tzeng reflect chronic bronchitic type lung changes and/or an underlying chronic interstitial lung disease. 2. Small right pleural effusion. Electronically Signed   By: Davina Poke D.O.   On: 01/07/2020 10:50   {   Assessment and Plan:   1. Chest pain/NSTEMI -Currently asymptomatic -Initial troponins 5807.  Continue trending troponins until peaked -Heparin drip per ACS protocol -Continue with aspirin 81 mg daily, Lipitor 40 mg daily -Initiate low-dose beta-blocker 25 mg twice daily -Obtain lipid panel -Get echocardiogram -Plan for left heart cath tomorrow.  N.p.o. midnight.  2.  History of hypertension -Blood pressure controlled -Beta-blocker as above  3.  History of hyperlipidemia -Patient with significant myalgias with several statins including Crestor, Lipitor, simvastatin -He takes lovastatin at home. -will dose pravachol 40mg  qd  Further recommendations pending cath results, echo results and clinical course.  Thank you for this consult. We will follow with you   Signed, Kate Sable, MD  01/07/2020 1:38 PM

## 2020-01-07 NOTE — ED Notes (Signed)
Pt returned from CT at this time, pt up to use the restroom and given urine sample cup by CT tech.

## 2020-01-07 NOTE — H&P (Signed)
History and Physical    [name] [id number] DOB: 1941-09-19 DOA: 01/07/2020  Referring MD/NP/PA:   PCP: Venia Carbon, MD   Patient coming from:  The patient is coming from home.  At baseline, pt is independent for most of ADL.        Chief Complaint: SOB and chest pain  HPI: [name] is a 79 y.o. male with medical history significant of hypertension, hyperlipidemia, IBS, arthritis, who presents with shortness of breath and chest pain.  Patient states that his symptoms started since Friday.  The chest pain is located in the substernal area, intermittent, moderate, pressure-like, nonradiating. He has some cough and coughs up tiny amount of blood per pt. Patient states that he has some intermittent abdominal discomfort, but currently no nausea, vomiting, diarrhea or abdominal pain.  No symptoms of UTI or unilateral weakness.  ED Course: pt was found to have troponin 5807, WBC 14.1, pending RVP for Covid, sodium 126, renal function normal, temperature normal, blood pressure 130/66, heart rate 81, oxygen saturation 93 to 98% on room air.  Chest x-ray showed chronic bronchitic change with small amount of right pleural effusion.  CT angiogram chest is negative for PE, but showed bilateral air pleural effusion with some groundglass infiltration in the upper lobes.  Patient is admitted to telemetry bed as inpatient with cardiology, Dr. Silas Flood was consulted.  Review of Systems:   General: no fevers, chills, no body weight gain, has fatigue HEENT: no blurry vision, hearing changes or sore throat Respiratory: has dyspnea, coughing, no wheezing CV: has chest pain, no palpitations GI: no nausea, vomiting, abdominal pain, diarrhea, constipation GU: no dysuria, burning on urination, increased urinary frequency, hematuria  Ext: no leg edema Neuro: no unilateral weakness, numbness, or tingling, no vision change or hearing loss Skin: no rash, no skin tear. MSK: No muscle spasm, no  deformity, no limitation of range of movement in spin Heme: No easy bruising.  Travel history: No recent long distant travel.  Allergy:  Allergies  Allergen Reactions  . Crestor [Rosuvastatin] Other (See Comments)    Severe myalgias  . Penicillins Rash  . Atorvastatin Other (See Comments)    ache  . Simvastatin Other (See Comments)    aching    Past Medical History:  Diagnosis Date  . Arthritis   . Bone spur 2010   right shoulder  . Hyperlipidemia   . Hypertension   . IBS (irritable bowel syndrome)     Past Surgical History:  Procedure Laterality Date  . BACK SURGERY     Lumbar  . BUNIONECTOMY    . HEMORRHOID SURGERY  2004  . INGUINAL HERNIA REPAIR  02/11  . ROTATOR CUFF REPAIR  07/11   left/ bone spur Dr.Blackman  . SHOULDER ARTHROSCOPY WITH OPEN ROTATOR CUFF REPAIR Right 05/26/2018   Procedure: SHOULDER ARTHROSCOPY WITH OPEN ROTATOR CUFF REPAIR;  Surgeon: Corky Mull, MD;  Location: [facility] ORS;  Service: Orthopedics;  Laterality: Right;  debridement, decompression  . SKIN CANCER EXCISION  2003-2005   right shoulder    Social History:  reports that he has never smoked. He has never used smokeless tobacco. He reports current alcohol use of about 1.0 standard drinks of alcohol per week. He reports that he does not use drugs.  Family History:  Family History  Problem Relation Age of Onset  . Cancer Neg Hx   . Diabetes Neg Hx      Prior to Admission medications   Medication Sig  Start Date End Date Taking? Authorizing Provider  acetaminophen (TYLENOL) 650 MG CR tablet Take 650 mg by mouth every 8 (eight) hours as needed for pain.    [provider]  amLODipine (NORVASC) 2.5 MG tablet Take 2.5 mg by mouth daily.    [provider]  Coenzyme Q10 (COQ10) 200 MG CAPS Take 200 mg by mouth daily.    [provider]  hyoscyamine (LEVSIN SL) 0.125 MG SL tablet dissolve 1 tablet under the tongue twice a day if needed for cramping Patient taking  differently: Take 0.125 mg by mouth 2 (two) times daily as needed for cramping.  06/22/16   Venia Carbon, MD  Lidocaine 4 % GEL Apply 1 application topically 2 (two) times daily as needed (for pain.).    [provider]  lovastatin (MEVACOR) 20 MG tablet Take 20 mg by mouth every morning.     [provider]  magnesium oxide (MAG-OX) 400 MG tablet Take 400 mg by mouth daily.    [provider]  oxyCODONE (ROXICODONE) 5 MG immediate release tablet Take 1-2 tablets (5-10 mg total) by mouth every 4 (four) hours as needed. 05/26/18   Poggi, Marshall Cork, MD  valACYclovir (VALTREX) 1000 MG tablet Take 1,000 mg by mouth daily as needed (for cold sores/fever blisters.).     [provider]  vitamin E 400 UNIT capsule Take 400 Units by mouth daily.    [provider]    Physical Exam: Vitals:   01/07/20 1300 01/07/20 1330 01/07/20 1400 01/07/20 1444  BP:  113/84 131/70 115/65  Pulse:  93 81 78  Resp:  19 (!) 22 (!) 22  Temp:      TempSrc:      SpO2: 96% 93% 94% 98%  Weight:      Height:       General: Not in acute distress HEENT:       Eyes: PERRL, EOMI, no scleral icterus.       ENT: No discharge from the ears and nose, no pharynx injection, no tonsillar enlargement.        Neck: No JVD, no bruit, no mass felt. Heme: No neck lymph node enlargement. Cardiac: S1/S2, RRR, No murmurs, No gallops or rubs. Respiratory: No rales, wheezing, rhonchi or rubs. GI: Soft, nondistended, nontender, no rebound pain, no organomegaly, BS present. GU: No hematuria Ext: No pitting leg edema bilaterally. 2+DP/PT pulse bilaterally. Musculoskeletal: No joint deformities, No joint redness or warmth, no limitation of ROM in spin. Skin: No rashes.  Neuro: Alert, oriented X3, cranial nerves II-XII grossly intact, moves all extremities normally. Psych: Patient is not psychotic, no suicidal or hemocidal ideation.  Labs on Admission: I have personally reviewed following labs  and imaging studies  CBC: Recent Labs  Lab 01/07/20 1030  WBC 14.1*  HGB 14.0  HCT 40.8  MCV 86.1  PLT 0000000   Basic Metabolic Panel: Recent Labs  Lab 01/07/20 1030  NA 126*  K 3.9  CL 90*  CO2 25  GLUCOSE 137*  BUN 10  CREATININE 0.91  CALCIUM 9.0   GFR: Estimated Creatinine Clearance: 57.9 mL/min (by C-G formula based on SCr of 0.91 mg/dL). Liver Function Tests: No results for input(s): AST, ALT, ALKPHOS, BILITOT, PROT, ALBUMIN in the last 168 hours. No results for input(s): LIPASE, AMYLASE in the last 168 hours. No results for input(s): AMMONIA in the last 168 hours. Coagulation Profile: Recent Labs  Lab 01/07/20 1251  INR 0.9   Cardiac  Enzymes: No results for input(s): CKTOTAL, CKMB, CKMBINDEX, TROPONINI in the last 168 hours. BNP (last 3 results) No results for input(s): PROBNP in the last 8760 hours. HbA1C: No results for input(s): HGBA1C in the last 72 hours. CBG: No results for input(s): GLUCAP in the last 168 hours. Lipid Profile: No results for input(s): CHOL, HDL, LDLCALC, TRIG, CHOLHDL, LDLDIRECT in the last 72 hours. Thyroid Function Tests: No results for input(s): TSH, T4TOTAL, FREET4, T3FREE, THYROIDAB in the last 72 hours. Anemia Panel: No results for input(s): VITAMINB12, FOLATE, FERRITIN, TIBC, IRON, RETICCTPCT in the last 72 hours. Urine analysis:    Component Value Date/Time   COLORURINE yellow 02/26/2009 1157   APPEARANCEUR Clear 02/26/2009 1157   LABSPEC <1.005 02/26/2009 1157   PHURINE 6.5 02/26/2009 1157   HGBUR negative 02/26/2009 1157   BILIRUBINUR negative 02/26/2009 1157   UROBILINOGEN 0.2 02/26/2009 1157   NITRITE negative 02/26/2009 1157   Sepsis Labs: @LABRCNTIP (procalcitonin:4,lacticidven:4) ) Recent Results (from the past 240 hour(s))  Respiratory Panel by RT PCR (Flu A&B, Covid) - Nasopharyngeal Swab     Status: None   Collection Time: 01/07/20 11:50 AM   Specimen: Nasopharyngeal Swab  Result Value Ref Range Status     SARS Coronavirus 2 by RT PCR NEGATIVE NEGATIVE Final    Comment: (NOTE) SARS-CoV-2 target nucleic acids are NOT DETECTED. The SARS-CoV-2 RNA is generally detectable in upper respiratoy specimens during the acute phase of infection. The lowest concentration of SARS-CoV-2 viral copies this assay can detect is 131 copies/mL. A negative result does not preclude SARS-Cov-2 infection and should not be used as the sole basis for treatment or other patient management decisions. A negative result Naraine occur with  improper specimen collection/handling, submission of specimen other than nasopharyngeal swab, presence of viral mutation(s) within the areas targeted by this assay, and inadequate number of viral copies (<131 copies/mL). A negative result must be combined with clinical observations, patient history, and epidemiological information. The expected result is Negative. Fact Sheet for Patients:  PinkCheek.be Fact Sheet for Healthcare Providers:  GravelBags.it This test is not yet ap proved or cleared by the [place] FDA and  has been authorized for detection and/or diagnosis of SARS-CoV-2 by FDA under an Emergency Use Authorization (EUA). This EUA will remain  in effect (meaning this test can be used) for the duration of the COVID-19 declaration under Section 564(b)(1) of the Act, 21 U.S.C. section 360bbb-3(b)(1), unless the authorization is terminated or revoked sooner.    Influenza A by PCR NEGATIVE NEGATIVE Final   Influenza B by PCR NEGATIVE NEGATIVE Final    Comment: (NOTE) The Xpert Xpress SARS-CoV-2/FLU/RSV assay is intended as an aid in  the diagnosis of influenza from Nasopharyngeal swab specimens and  should not be used as a sole basis for treatment. Nasal washings and  aspirates are unacceptable for Xpert Xpress SARS-CoV-2/FLU/RSV  testing. Fact Sheet for Patients: PinkCheek.be Fact  Sheet for Healthcare Providers: GravelBags.it This test is not yet approved or cleared by the [place] FDA and  has been authorized for detection and/or diagnosis of SARS-CoV-2 by  FDA under an Emergency Use Authorization (EUA). This EUA will remain  in effect (meaning this test can be used) for the duration of the  Covid-19 declaration under Section 564(b)(1) of the Act, 21  U.S.C. section 360bbb-3(b)(1), unless the authorization is  terminated or revoked. Performed at [facility], [address] [place], [place] [address]      Radiological Exams on Admission: CT  Angio Chest PE W and/or Wo Contrast  Result Date: 01/07/2020 CLINICAL DATA:  Short of breath. EXAM: CT ANGIOGRAPHY CHEST WITH CONTRAST TECHNIQUE: Multidetector CT imaging of the chest was performed using the standard protocol during bolus administration of intravenous contrast. Multiplanar CT image reconstructions and MIPs were obtained to evaluate the vascular anatomy. CONTRAST:  27mL OMNIPAQUE IOHEXOL 350 MG/ML SOLN COMPARISON:  None. FINDINGS: Cardiovascular: No filling defects within the pulmonary arteries arteries to suggest acute pulmonary embolism. Coronary artery calcification and aortic atherosclerotic calcification. Mediastinum/Nodes: No axillary supraclavicular adenopathy. No discrete enlarged mediastinal lymph nodes. Lungs/Pleura: Large bilateral layering pleural effusions. Mild ground-glass perihilar opacities in the LEFT and RIGHT upper lobe. Mild basilar atelectasis related to the large effusions. Pulmonary infarction. Upper Abdomen: Limited view of the liver, kidneys, pancreas are unremarkable. Normal adrenal glands. Musculoskeletal: No aggressive osseous lesion Review of the MIP images confirms the above findings. IMPRESSION: 1. No acute pulmonary embolism. 2. Large bilateral layering pleural effusions. 3. Ground-glass opacities in the upper lobes likely represent mild edema. 4.  Coronary artery calcification and Aortic Atherosclerosis (ICD10-I70.0). Electronically Signed   By: Suzy Bouchard M.D.   On: 01/07/2020 11:38   DG Chest Portable 1 View  Result Date: 01/07/2020 CLINICAL DATA:  Shortness of breath, hemoptysis EXAM: PORTABLE CHEST 1 VIEW COMPARISON:  None. FINDINGS: The heart size and mediastinal contours are within normal limits. Prominent interstitial markings throughout both lungs. Small right pleural effusion. No pneumothorax. Degenerative changes of the thoracic spine. IMPRESSION: 1. Prominent interstitial markings throughout both lungs. Findings Barnier reflect chronic bronchitic type lung changes and/or an underlying chronic interstitial lung disease. 2. Small right pleural effusion. Electronically Signed   By: Davina Poke D.O.   On: 01/07/2020 10:50     EKG: Independently reviewed.  Sinus rhythm, QTC 445, ST depression in V4-V6.  Assessment/Plan Principal Problem:   Chest pain Active Problems:   Bilateral pleural effusion   NSTEMI (non-ST elevated myocardial infarction) (HCC)   Hyponatremia   Hypertension   Hyperlipidemia   Chest pain and NSTEMI: trop 5807 -->5220. Card, Dr. Garen Lah is consulted -->planning left cardiac cath tomorrow  - will admit to tele bed as inpt - Highly appreciate Dr. Thereasa Solo recommendation - IV heparin - Trend Trop - Repeat EKG in the am  - prn Nitroglycerin, Morphine, and aspirin, statin - added metoprolol 25 mg twice daily per cardiology - Risk factor stratification: will check FLP and A1C  - check UDS - 2d echo -NPO after MN  Bilateral pleural effusion: Etiology is not clear.  Patient does not have fever, but has leukocytosis with WBC 14.1 -We will start antibiotics empirically vancomycin and cefepime. -f/u Blood cultur e -Befort need thoracentesis after cardiac cath is performed and patient stabilized.  HTN:  -Amlodipine, metoprolol -hydralazine prn  Hyponatremia: Na 126.  Etiology is not clear.   Differential diagnosis include poor oral intake, dehydration, SIADH, thyroid dysfunction - Will check urine sodium, urine osmolality, serum osmolality. - check TSH - IVF: NS at 75 mL/h - f/u by BMP q8h - avoid over correction too fast due to risk of central pontine myelinolysis  Hyperlipidemia: -Pravastatin    Inpatient status:  # Patient requires inpatient status due to high intensity of service, high risk for further deterioration and high frequency of surveillance required.  I certify that at the point of admission it is my clinical judgment that the patient will require inpatient hospital care spanning beyond 2 midnights from the point of admission.  . This patient  has multiple chronic comorbidities including hypertension, hyperlipidemia, IBS, arthritis . Now patient has presenting with chest pain and NSTEMI, also has hyponatremia, bilateral pleural effusion . The initial radiographic and laboratory data are worrisome because of elevated troponin, leukocytosis, hyponatremia, bilateral pleural effusion on chest x-ray . Current medical needs: please see my assessment and plan . Predictability of an adverse outcome (risk): Patient has multiple comorbidities as listed above.Now presents with chest pain and NSTEMI, also has hyponatremia, bilateral pleural effusion. Need left heart cath tomorrow. Patient's presentation is highly complicated.  Patient is at high risk of deteriorating.  Will need to be treated in hospital for at least 2 days.         DVT ppx: on IV Heparin   Code Status: Full code Family Communication: None at bed side.  Disposition Plan:  Anticipate discharge back to previous home environment Consults called:  Card, Dr. Garen Lah Admission status: Med-surg bed as inpt    Date of Service 01/07/2020    [place] [facility]   If 7PM-7AM, please contact night-coverage [email or web address] Password [email or web address] 01/07/2020, 2:49 PM

## 2020-01-07 NOTE — ED Notes (Signed)
Pt refusing wheelchair to room despite labored breathing

## 2020-01-07 NOTE — ED Notes (Signed)
Admitting MD at bedside at this time.

## 2020-01-07 NOTE — Consult Note (Signed)
Pharmacy Antibiotic Note  [name] is a 79 y.o. male admitted on 01/07/2020 with Bilateral pleural effusion with leukocytosis. CTA significant for large bilateral layering pleural effusions. Patient is requiring 2L [place]. WBC elevated to 14.1. Pharmacy has been consulted for cefepime and vancomycin dosing.  Plan: Cefepime 2 g q12h (renally adjusted)  Vancomycin 1500 mg loading dose x 1 followed by Vancomycin 1250 mg IV Q 24 hrs. Goal AUC 400-550. Expected AUC: 503.7, trough 10.8 SCr used: 0.91  Daily Scr while on vancomycin. Adjusted antibiotics as indicated per renal function  Height: 5\' 7"  (170.2 cm) Weight: 135 lb (61.2 kg) IBW/kg (Calculated) : 66.1  Temp (24hrs), Avg:97.6 F (36.4 C), Min:97.6 F (36.4 C), Max:97.6 F (36.4 C)  Recent Labs  Lab 01/07/20 1030  WBC 14.1*  CREATININE 0.91    Estimated Creatinine Clearance: 57.9 mL/min (by C-G formula based on SCr of 0.91 mg/dL).    Allergies  Allergen Reactions  . Crestor [Rosuvastatin] Other (See Comments)    Severe myalgias  . Penicillins Rash  . Atorvastatin Other (See Comments)    ache  . Simvastatin Other (See Comments)    aching    Antimicrobials this admission: Vancomycin 1/17 >>  Cefepime 1/17 >>   Dose adjustments this admission: n/a  Microbiology results: 1/17 BCx: pending 1/17 MRSA PCR: pending 1/17 Influenza A/B + SARS-CoV-2 PCR negative  Thank you for allowing pharmacy to be a part of this patient's care.  [place] Resident 01/07/2020 1:55 PM

## 2020-01-07 NOTE — ED Notes (Signed)
Date and time results received: 01/07/20 11:06 AM  (use smartphrase ".now" to insert current time)  Test: Trop Critical Value: 5807  Name of Provider Notified: Archie Balboa  Orders Received? Or Actions Taken?: Crtical result acknowledged

## 2020-01-08 ENCOUNTER — Inpatient Hospital Stay (HOSPITAL_COMMUNITY): Payer: Medicare PPO | Admitting: Certified Registered Nurse Anesthetist

## 2020-01-08 ENCOUNTER — Encounter
Admission: EM | Disposition: A | Discharge status: Other Acute Inpatient Hospital | Payer: Self-pay | Source: Home / Self Care | Attending: Internal Medicine

## 2020-01-08 ENCOUNTER — Inpatient Hospital Stay (HOSPITAL_COMMUNITY): Payer: Medicare PPO

## 2020-01-08 ENCOUNTER — Inpatient Hospital Stay: Admit: 2020-01-08 | Payer: Medicare PPO

## 2020-01-08 ENCOUNTER — Telehealth: Payer: Self-pay

## 2020-01-08 ENCOUNTER — Other Ambulatory Visit: Payer: Self-pay

## 2020-01-08 ENCOUNTER — Inpatient Hospital Stay (HOSPITAL_COMMUNITY)
Admission: EM | Disposition: A | Discharge status: Home / Self Care | Payer: Self-pay | Source: Other Acute Inpatient Hospital | Attending: Cardiothoracic Surgery

## 2020-01-08 ENCOUNTER — Encounter: Payer: Self-pay | Admitting: Cardiology

## 2020-01-08 ENCOUNTER — Inpatient Hospital Stay (HOSPITAL_COMMUNITY)
Admission: EM | Admit: 2020-01-08 | Discharge: 2020-01-14 | DRG: 234 | Disposition: A | Discharge status: Home / Self Care | Payer: Medicare PPO | Source: Other Acute Inpatient Hospital | Attending: Cardiothoracic Surgery | Admitting: Cardiothoracic Surgery

## 2020-01-08 DIAGNOSIS — E872 Acidosis: Secondary | ICD-10-CM | POA: Diagnosis not present

## 2020-01-08 DIAGNOSIS — Z01810 Encounter for preprocedural cardiovascular examination: Secondary | ICD-10-CM

## 2020-01-08 DIAGNOSIS — Z951 Presence of aortocoronary bypass graft: Secondary | ICD-10-CM

## 2020-01-08 DIAGNOSIS — I2510 Atherosclerotic heart disease of native coronary artery without angina pectoris: Secondary | ICD-10-CM

## 2020-01-08 DIAGNOSIS — I4891 Unspecified atrial fibrillation: Secondary | ICD-10-CM | POA: Diagnosis not present

## 2020-01-08 DIAGNOSIS — Z79899 Other long term (current) drug therapy: Secondary | ICD-10-CM | POA: Diagnosis not present

## 2020-01-08 DIAGNOSIS — Z888 Allergy status to other drugs, medicaments and biological substances status: Secondary | ICD-10-CM

## 2020-01-08 DIAGNOSIS — Z20822 Contact with and (suspected) exposure to covid-19: Secondary | ICD-10-CM | POA: Diagnosis present

## 2020-01-08 DIAGNOSIS — I10 Essential (primary) hypertension: Secondary | ICD-10-CM | POA: Diagnosis present

## 2020-01-08 DIAGNOSIS — I25110 Atherosclerotic heart disease of native coronary artery with unstable angina pectoris: Secondary | ICD-10-CM

## 2020-01-08 DIAGNOSIS — I214 Non-ST elevation (NSTEMI) myocardial infarction: Secondary | ICD-10-CM

## 2020-01-08 DIAGNOSIS — R079 Chest pain, unspecified: Secondary | ICD-10-CM | POA: Diagnosis not present

## 2020-01-08 DIAGNOSIS — Z09 Encounter for follow-up examination after completed treatment for conditions other than malignant neoplasm: Secondary | ICD-10-CM

## 2020-01-08 DIAGNOSIS — E785 Hyperlipidemia, unspecified: Secondary | ICD-10-CM | POA: Diagnosis not present

## 2020-01-08 DIAGNOSIS — R0602 Shortness of breath: Secondary | ICD-10-CM | POA: Diagnosis not present

## 2020-01-08 HISTORY — PX: TEE WITHOUT CARDIOVERSION: SHX5443

## 2020-01-08 HISTORY — DX: Presence of aortocoronary bypass graft: Z95.1

## 2020-01-08 HISTORY — PX: LEFT HEART CATH AND CORONARY ANGIOGRAPHY: CATH118249

## 2020-01-08 HISTORY — PX: CORONARY ARTERY BYPASS GRAFT: SHX141

## 2020-01-08 LAB — CBC
HCT: 34.8 % — ABNORMAL LOW (ref 39.0–52.0)
HCT: 27.2 % — ABNORMAL LOW (ref 39.0–52.0)
Hemoglobin: 11.6 g/dL — ABNORMAL LOW (ref 13.0–17.0)
Hemoglobin: 9.1 g/dL — ABNORMAL LOW (ref 13.0–17.0)
MCH: 29.4 pg (ref 26.0–34.0)
MCH: 30.5 pg (ref 26.0–34.0)
MCHC: 33.3 g/dL (ref 30.0–36.0)
MCHC: 33.5 g/dL (ref 30.0–36.0)
MCV: 88.3 fL (ref 80.0–100.0)
MCV: 91.3 fL (ref 80.0–100.0)
Platelets: 171 10*3/uL (ref 150–400)
Platelets: 102 10*3/uL — ABNORMAL LOW (ref 150–400)
RBC: 3.94 MIL/uL — ABNORMAL LOW (ref 4.22–5.81)
RBC: 2.98 MIL/uL — ABNORMAL LOW (ref 4.22–5.81)
RDW: 12.6 % (ref 11.5–15.5)
RDW: 12.9 % (ref 11.5–15.5)
WBC: 11.1 10*3/uL — ABNORMAL HIGH (ref 4.0–10.5)
WBC: 12 10*3/uL — ABNORMAL HIGH (ref 4.0–10.5)
nRBC: 0 % (ref 0.0–0.2)
nRBC: 0 % (ref 0.0–0.2)

## 2020-01-08 LAB — BASIC METABOLIC PANEL
Anion gap: 10 (ref 5–15)
BUN: 11 mg/dL (ref 8–23)
CO2: 21 mmol/L — ABNORMAL LOW (ref 22–32)
Calcium: 7.7 mg/dL — ABNORMAL LOW (ref 8.9–10.3)
Chloride: 105 mmol/L (ref 98–111)
Creatinine, Ser: 0.99 mg/dL (ref 0.61–1.24)
GFR calc Af Amer: >60 mL/min (ref >60)
GFR calc non Af Amer: >60 mL/min (ref >60)
Glucose, Bld: 102 mg/dL — ABNORMAL HIGH (ref 70–99)
Potassium: 4.1 mmol/L (ref 3.5–5.1)
Sodium: 136 mmol/L (ref 135–145)

## 2020-01-08 LAB — POCT I-STAT 7, (LYTES, BLD GAS, ICA,H+H)
Acid-base deficit: 2 mmol/L (ref 0.0–2.0)
Bicarbonate: 23.2 mmol/L (ref 20.0–28.0)
Calcium, Ion: 1.03 mmol/L — ABNORMAL LOW (ref 1.15–1.40)
HCT: 25 % — ABNORMAL LOW (ref 39.0–52.0)
Hemoglobin: 8.5 g/dL — ABNORMAL LOW (ref 13.0–17.0)
O2 Saturation: 96 %
Patient temperature: 36.5
Potassium: 4 mmol/L (ref 3.5–5.1)
Sodium: 141 mmol/L (ref 135–145)
TCO2: 24 mmol/L (ref 22–32)
pCO2 arterial: 41.5 mmHg (ref 32.0–48.0)
pH, Arterial: 7.353 (ref 7.350–7.450)
pO2, Arterial: 84 mmHg (ref 83.0–108.0)

## 2020-01-08 LAB — HEMOGLOBIN A1C
Hgb A1c MFr Bld: 5.4 % (ref 4.8–5.6)
Mean Plasma Glucose: 108.28 mg/dL

## 2020-01-08 LAB — HEMOGLOBIN AND HEMATOCRIT, BLOOD
HCT: 26 % — ABNORMAL LOW (ref 39.0–52.0)
Hemoglobin: 8.8 g/dL — ABNORMAL LOW (ref 13.0–17.0)

## 2020-01-08 LAB — PROTIME-INR
INR: 1.6 — ABNORMAL HIGH (ref 0.8–1.2)
Prothrombin Time: 19.1 seconds — ABNORMAL HIGH (ref 11.4–15.2)

## 2020-01-08 LAB — APTT: aPTT: 38 seconds — ABNORMAL HIGH (ref 24–36)

## 2020-01-08 LAB — LIPID PANEL
Cholesterol: 152 mg/dL (ref 0–200)
HDL: 34 mg/dL — ABNORMAL LOW (ref >40)
LDL Cholesterol: 105 mg/dL — ABNORMAL HIGH (ref 0–99)
Total CHOL/HDL Ratio: 4.5 RATIO
Triglycerides: 64 mg/dL (ref <150)
VLDL: 13 mg/dL (ref 0–40)

## 2020-01-08 LAB — ABO/RH: ABO/RH(D): O POS

## 2020-01-08 LAB — GLUCOSE, CAPILLARY
Glucose-Capillary: 114 mg/dL — ABNORMAL HIGH (ref 70–99)
Glucose-Capillary: 90 mg/dL (ref 70–99)
Glucose-Capillary: 97 mg/dL (ref 70–99)

## 2020-01-08 LAB — TSH: TSH: 2.687 u[IU]/mL (ref 0.350–4.500)

## 2020-01-08 LAB — HEPARIN LEVEL (UNFRACTIONATED): Heparin Unfractionated: 0.23 IU/mL — ABNORMAL LOW (ref 0.30–0.70)

## 2020-01-08 LAB — TROPONIN I (HIGH SENSITIVITY): Troponin I (High Sensitivity): 4392 ng/L (ref <18)

## 2020-01-08 LAB — PLATELET COUNT: Platelets: 127 10*3/uL — ABNORMAL LOW (ref 150–400)

## 2020-01-08 LAB — PREPARE RBC (CROSSMATCH)

## 2020-01-08 SURGERY — CORONARY ARTERY BYPASS GRAFTING (CABG)
Anesthesia: General | Site: Chest

## 2020-01-08 SURGERY — LEFT HEART CATH AND CORONARY ANGIOGRAPHY
Anesthesia: Moderate Sedation

## 2020-01-08 MED ORDER — NOREPINEPHRINE 4 MG/250ML-% IV SOLN
0.0000 ug/min | INTRAVENOUS | Status: DC
  Filled 2020-01-08: qty 250

## 2020-01-08 MED ORDER — EPHEDRINE SULFATE 50 MG/ML IJ SOLN
INTRAMUSCULAR | Status: DC | PRN
  Administered 2020-01-08 (×2): 10 mg via INTRAVENOUS

## 2020-01-08 MED ORDER — DOCUSATE SODIUM 100 MG PO CAPS
200.0000 mg | ORAL_CAPSULE | Freq: Every day | ORAL | Status: DC
  Administered 2020-01-09 – 2020-01-13 (×4): 200 mg via ORAL
  Filled 2020-01-08 (×4): qty 2

## 2020-01-08 MED ORDER — BISACODYL 5 MG PO TBEC
10.0000 mg | DELAYED_RELEASE_TABLET | Freq: Every day | ORAL | Status: DC
  Administered 2020-01-09 – 2020-01-11 (×3): 10 mg via ORAL
  Filled 2020-01-08 (×4): qty 2

## 2020-01-08 MED ORDER — INSULIN REGULAR(HUMAN) IN NACL 100-0.9 UT/100ML-% IV SOLN: INTRAVENOUS | Status: DC

## 2020-01-08 MED ORDER — INSULIN ASPART 100 UNIT/ML ~~LOC~~ SOLN
0.0000 [IU] | SUBCUTANEOUS | Status: DC
  Administered 2020-01-09: 4 [IU] via SUBCUTANEOUS
  Administered 2020-01-09 – 2020-01-10 (×7): 2 [IU] via SUBCUTANEOUS
  Administered 2020-01-10: 4 [IU] via SUBCUTANEOUS

## 2020-01-08 MED ORDER — NITROGLYCERIN IN D5W 200-5 MCG/ML-% IV SOLN
2.0000 ug/min | INTRAVENOUS | Status: DC
  Filled 2020-01-08: qty 250

## 2020-01-08 MED ORDER — SODIUM CHLORIDE 0.9% FLUSH: 3.0000 mL | INTRAVENOUS | Status: DC | PRN

## 2020-01-08 MED ORDER — CHLORHEXIDINE GLUCONATE 4 % EX LIQD: 1.0000 "application " | Freq: Once | CUTANEOUS | Status: DC

## 2020-01-08 MED ORDER — SODIUM CHLORIDE 0.9 % IV SOLN: 250.0000 mL | INTRAVENOUS | Status: DC | PRN

## 2020-01-08 MED ORDER — METOPROLOL TARTRATE 5 MG/5ML IV SOLN: 2.5000 mg | INTRAVENOUS | Status: DC | PRN

## 2020-01-08 MED ORDER — SODIUM CHLORIDE 0.9 % IV SOLN
INTRAVENOUS | Status: DC
  Filled 2020-01-08: qty 30

## 2020-01-08 MED ORDER — LACTATED RINGERS IV SOLN: INTRAVENOUS | Status: DC

## 2020-01-08 MED ORDER — ACETAMINOPHEN 160 MG/5ML PO SOLN: 650.0000 mg | Freq: Once | ORAL | Status: AC

## 2020-01-08 MED ORDER — TRANEXAMIC ACID 1000 MG/10ML IV SOLN
1.5000 mg/kg/h | INTRAVENOUS | Status: AC
  Administered 2020-01-08: 1.5 mg/kg/h via INTRAVENOUS
  Filled 2020-01-08: qty 25

## 2020-01-08 MED ORDER — SODIUM CHLORIDE 0.9% FLUSH
10.0000 mL | Freq: Two times a day (BID) | INTRAVENOUS | Status: DC
  Administered 2020-01-10 – 2020-01-11 (×3): 10 mL

## 2020-01-08 MED ORDER — SODIUM CHLORIDE 0.9 % IV SOLN: 2.0000 g | Freq: Two times a day (BID) | INTRAVENOUS | 0 refills | Status: DC

## 2020-01-08 MED ORDER — PHENYLEPHRINE HCL-NACL 20-0.9 MG/250ML-% IV SOLN
30.0000 ug/min | INTRAVENOUS | Status: AC
  Administered 2020-01-08: 50 ug/min via INTRAVENOUS
  Filled 2020-01-08: qty 250

## 2020-01-08 MED ORDER — ASPIRIN 81 MG PO CHEW
CHEWABLE_TABLET | ORAL | Status: AC
  Filled 2020-01-08: qty 1

## 2020-01-08 MED ORDER — HEMOSTATIC AGENTS (NO CHARGE) OPTIME
TOPICAL | Status: DC | PRN
  Administered 2020-01-08 (×2): 1 via TOPICAL

## 2020-01-08 MED ORDER — HEPARIN (PORCINE) 25000 UT/250ML-% IV SOLN: 1050.0000 [IU]/h | INTRAVENOUS | Status: DC

## 2020-01-08 MED ORDER — TRANEXAMIC ACID (OHS) BOLUS VIA INFUSION
15.0000 mg/kg | INTRAVENOUS | Status: AC
  Administered 2020-01-08: 954 mg via INTRAVENOUS
  Filled 2020-01-08: qty 954

## 2020-01-08 MED ORDER — FENTANYL CITRATE (PF) 250 MCG/5ML IJ SOLN
INTRAMUSCULAR | Status: DC | PRN
  Administered 2020-01-08: 100 ug via INTRAVENOUS
  Administered 2020-01-08: 50 ug via INTRAVENOUS
  Administered 2020-01-08: 100 ug via INTRAVENOUS
  Administered 2020-01-08: 700 ug via INTRAVENOUS
  Administered 2020-01-08: 50 ug via INTRAVENOUS

## 2020-01-08 MED ORDER — ACETAMINOPHEN 650 MG RE SUPP
650.0000 mg | Freq: Once | RECTAL | Status: AC
  Administered 2020-01-08: 650 mg via RECTAL

## 2020-01-08 MED ORDER — INSULIN REGULAR(HUMAN) IN NACL 100-0.9 UT/100ML-% IV SOLN
INTRAVENOUS | Status: AC
  Administered 2020-01-08: .6 [IU]/h via INTRAVENOUS
  Filled 2020-01-08: qty 100

## 2020-01-08 MED ORDER — SODIUM CHLORIDE 0.9% FLUSH
3.0000 mL | Freq: Two times a day (BID) | INTRAVENOUS | Status: DC
  Administered 2020-01-10 – 2020-01-13 (×8): 3 mL via INTRAVENOUS

## 2020-01-08 MED ORDER — LACTATED RINGERS IV SOLN: 500.0000 mL | Freq: Once | INTRAVENOUS | Status: DC | PRN

## 2020-01-08 MED ORDER — MIDAZOLAM HCL 2 MG/2ML IJ SOLN
INTRAMUSCULAR | Status: DC | PRN
  Administered 2020-01-08: 1 mg via INTRAVENOUS

## 2020-01-08 MED ORDER — VERAPAMIL HCL 2.5 MG/ML IV SOLN
INTRAVENOUS | Status: DC | PRN
  Administered 2020-01-08: 2.5 mg via INTRA_ARTERIAL

## 2020-01-08 MED ORDER — LACTATED RINGERS IV SOLN: INTRAVENOUS | Status: DC | PRN

## 2020-01-08 MED ORDER — HEPARIN (PORCINE) IN NACL 1000-0.9 UT/500ML-% IV SOLN
INTRAVENOUS | Status: DC | PRN
  Administered 2020-01-08: 500 mL

## 2020-01-08 MED ORDER — PHENYLEPHRINE HCL-NACL 20-0.9 MG/250ML-% IV SOLN
0.0000 ug/min | INTRAVENOUS | Status: DC
  Administered 2020-01-10: 20 ug/min via INTRAVENOUS
  Filled 2020-01-08: qty 250

## 2020-01-08 MED ORDER — TRANEXAMIC ACID (OHS) PUMP PRIME SOLUTION
2.0000 mg/kg | INTRAVENOUS | Status: DC
  Filled 2020-01-08: qty 1.27

## 2020-01-08 MED ORDER — FENTANYL CITRATE (PF) 100 MCG/2ML IJ SOLN
INTRAMUSCULAR | Status: AC
  Filled 2020-01-08: qty 2

## 2020-01-08 MED ORDER — IOHEXOL 300 MG/ML  SOLN
INTRAMUSCULAR | Status: DC | PRN
  Administered 2020-01-08: 11:00:00 30 mL

## 2020-01-08 MED ORDER — FAMOTIDINE IN NACL 20-0.9 MG/50ML-% IV SOLN
20.0000 mg | Freq: Two times a day (BID) | INTRAVENOUS | Status: DC
  Administered 2020-01-08: 20 mg via INTRAVENOUS

## 2020-01-08 MED ORDER — CHLORHEXIDINE GLUCONATE 0.12% ORAL RINSE (MEDLINE KIT): 15.0000 mL | Freq: Two times a day (BID) | OROMUCOSAL | Status: DC

## 2020-01-08 MED ORDER — SODIUM CHLORIDE 0.9 % IV SOLN: 250.0000 mL | INTRAVENOUS | Status: DC

## 2020-01-08 MED ORDER — HEPARIN SODIUM (PORCINE) 1000 UNIT/ML IJ SOLN
INTRAMUSCULAR | Status: DC | PRN
  Administered 2020-01-08: 2000 [IU] via INTRAVENOUS
  Administered 2020-01-08: 20000 [IU] via INTRAVENOUS

## 2020-01-08 MED ORDER — CHLORHEXIDINE GLUCONATE 0.12 % MT SOLN
15.0000 mL | OROMUCOSAL | Status: AC
  Administered 2020-01-08: 15 mL via OROMUCOSAL

## 2020-01-08 MED ORDER — HEPARIN (PORCINE) IN NACL 1000-0.9 UT/500ML-% IV SOLN
INTRAVENOUS | Status: AC
  Filled 2020-01-08: qty 1000

## 2020-01-08 MED ORDER — PROTAMINE SULFATE 10 MG/ML IV SOLN
INTRAVENOUS | Status: DC | PRN
  Administered 2020-01-08: 10 mg via INTRAVENOUS
  Administered 2020-01-08: 210 mg via INTRAVENOUS

## 2020-01-08 MED ORDER — EPINEPHRINE PF 1 MG/ML IJ SOLN
0.0000 ug/min | INTRAVENOUS | Status: DC
  Filled 2020-01-08: qty 4

## 2020-01-08 MED ORDER — SODIUM CHLORIDE 0.45 % IV SOLN: INTRAVENOUS | Status: DC | PRN

## 2020-01-08 MED ORDER — HEPARIN SODIUM (PORCINE) 1000 UNIT/ML IJ SOLN
INTRAMUSCULAR | Status: AC
  Filled 2020-01-08: qty 1

## 2020-01-08 MED ORDER — ALBUMIN HUMAN 5 % IV SOLN: INTRAVENOUS | Status: DC | PRN

## 2020-01-08 MED ORDER — DEXMEDETOMIDINE HCL IN NACL 400 MCG/100ML IV SOLN: 0.0000 ug/kg/h | INTRAVENOUS | Status: DC

## 2020-01-08 MED ORDER — TEMAZEPAM 15 MG PO CAPS: 15.0000 mg | ORAL_CAPSULE | Freq: Once | ORAL | Status: DC | PRN

## 2020-01-08 MED ORDER — POTASSIUM CHLORIDE 10 MEQ/50ML IV SOLN: 10.0000 meq | INTRAVENOUS | Status: AC

## 2020-01-08 MED ORDER — HEPARIN SODIUM (PORCINE) 1000 UNIT/ML IJ SOLN
INTRAMUSCULAR | Status: DC | PRN
  Administered 2020-01-08: 4000 [IU] via INTRAVENOUS

## 2020-01-08 MED ORDER — NITROGLYCERIN IN D5W 200-5 MCG/ML-% IV SOLN: 0.0000 ug/min | INTRAVENOUS | Status: DC

## 2020-01-08 MED ORDER — ALBUMIN HUMAN 5 % IV SOLN
250.0000 mL | INTRAVENOUS | Status: AC | PRN
  Administered 2020-01-08 – 2020-01-09 (×3): 12.5 g via INTRAVENOUS
  Filled 2020-01-08: qty 250

## 2020-01-08 MED ORDER — DEXTROSE 50 % IV SOLN: 0.0000 mL | INTRAVENOUS | Status: DC | PRN

## 2020-01-08 MED ORDER — VERAPAMIL HCL 2.5 MG/ML IV SOLN
INTRAVENOUS | Status: AC
  Filled 2020-01-08: qty 2

## 2020-01-08 MED ORDER — LEVOFLOXACIN IN D5W 750 MG/150ML IV SOLN
750.0000 mg | INTRAVENOUS | Status: AC
  Administered 2020-01-09: 750 mg via INTRAVENOUS
  Filled 2020-01-08: qty 150

## 2020-01-08 MED ORDER — MIDAZOLAM HCL 2 MG/2ML IJ SOLN
INTRAMUSCULAR | Status: AC
  Filled 2020-01-08: qty 2

## 2020-01-08 MED ORDER — CHLORHEXIDINE GLUCONATE CLOTH 2 % EX PADS
6.0000 | MEDICATED_PAD | Freq: Every day | CUTANEOUS | Status: DC
  Administered 2020-01-09 – 2020-01-11 (×3): 6 via TOPICAL

## 2020-01-08 MED ORDER — VANCOMYCIN HCL IN DEXTROSE 1-5 GM/200ML-% IV SOLN
1000.0000 mg | Freq: Once | INTRAVENOUS | Status: AC
  Administered 2020-01-09: 01:00:00 1000 mg via INTRAVENOUS
  Filled 2020-01-08: qty 200

## 2020-01-08 MED ORDER — ASPIRIN 81 MG PO CHEW: 324.0000 mg | CHEWABLE_TABLET | Freq: Every day | ORAL | Status: DC

## 2020-01-08 MED ORDER — LEVOFLOXACIN IN D5W 500 MG/100ML IV SOLN
500.0000 mg | INTRAVENOUS | Status: AC
  Administered 2020-01-08: 500 mg via INTRAVENOUS
  Filled 2020-01-08: qty 100

## 2020-01-08 MED ORDER — ASPIRIN EC 325 MG PO TBEC
325.0000 mg | DELAYED_RELEASE_TABLET | Freq: Every day | ORAL | Status: DC
  Administered 2020-01-09 – 2020-01-14 (×6): 325 mg via ORAL
  Filled 2020-01-08 (×6): qty 1

## 2020-01-08 MED ORDER — MIDAZOLAM HCL 2 MG/2ML IJ SOLN: 2.0000 mg | INTRAMUSCULAR | Status: DC | PRN

## 2020-01-08 MED ORDER — METOPROLOL TARTRATE 25 MG/10 ML ORAL SUSPENSION
12.5000 mg | Freq: Two times a day (BID) | ORAL | Status: DC
  Filled 2020-01-08 (×5): qty 5

## 2020-01-08 MED ORDER — MIDAZOLAM HCL 5 MG/5ML IJ SOLN
INTRAMUSCULAR | Status: DC | PRN
  Administered 2020-01-08: 3 mg via INTRAVENOUS
  Administered 2020-01-08: 2 mg via INTRAVENOUS

## 2020-01-08 MED ORDER — SODIUM CHLORIDE 0.9% FLUSH: 10.0000 mL | INTRAVENOUS | Status: DC | PRN

## 2020-01-08 MED ORDER — METOPROLOL TARTRATE 25 MG PO TABS: 12.5000 mg | ORAL_TABLET | Freq: Two times a day (BID) | ORAL | Status: DC

## 2020-01-08 MED ORDER — METOPROLOL TARTRATE 12.5 MG HALF TABLET: 12.5000 mg | ORAL_TABLET | Freq: Once | ORAL | Status: AC

## 2020-01-08 MED ORDER — ACETAMINOPHEN 500 MG PO TABS
1000.0000 mg | ORAL_TABLET | Freq: Four times a day (QID) | ORAL | Status: AC
  Administered 2020-01-09 – 2020-01-12 (×14): 1000 mg via ORAL
  Administered 2020-01-13 (×2): 500 mg via ORAL
  Administered 2020-01-13: 13:00:00 1000 mg via ORAL
  Filled 2020-01-08 (×19): qty 2

## 2020-01-08 MED ORDER — OXYCODONE HCL 5 MG PO TABS: 5.0000 mg | ORAL_TABLET | ORAL | Status: DC | PRN

## 2020-01-08 MED ORDER — METOPROLOL TARTRATE 12.5 MG HALF TABLET
12.5000 mg | ORAL_TABLET | Freq: Two times a day (BID) | ORAL | Status: DC
  Administered 2020-01-09 – 2020-01-14 (×10): 12.5 mg via ORAL
  Filled 2020-01-08 (×10): qty 1

## 2020-01-08 MED ORDER — SODIUM CHLORIDE (PF) 0.9 % IJ SOLN
OROMUCOSAL | Status: DC | PRN
  Administered 2020-01-08 (×3): 3 mL via TOPICAL

## 2020-01-08 MED ORDER — POTASSIUM CHLORIDE 2 MEQ/ML IV SOLN
80.0000 meq | INTRAVENOUS | Status: DC
  Filled 2020-01-08: qty 40

## 2020-01-08 MED ORDER — ORAL CARE MOUTH RINSE
15.0000 mL | OROMUCOSAL | Status: DC
  Administered 2020-01-08 (×2): 15 mL via OROMUCOSAL

## 2020-01-08 MED ORDER — METOPROLOL TARTRATE 12.5 MG HALF TABLET
ORAL_TABLET | ORAL | Status: AC
  Administered 2020-01-08: 12.5 mg via ORAL
  Filled 2020-01-08: qty 1

## 2020-01-08 MED ORDER — SODIUM CHLORIDE 0.9% FLUSH: 3.0000 mL | Freq: Two times a day (BID) | INTRAVENOUS | Status: DC

## 2020-01-08 MED ORDER — VANCOMYCIN HCL 1250 MG/250ML IV SOLN
1250.0000 mg | INTRAVENOUS | Status: AC
  Administered 2020-01-08: 1500 mg via INTRAVENOUS
  Filled 2020-01-08: qty 250

## 2020-01-08 MED ORDER — ONDANSETRON HCL 4 MG/2ML IJ SOLN: 4.0000 mg | Freq: Four times a day (QID) | INTRAMUSCULAR | Status: DC | PRN

## 2020-01-08 MED ORDER — CHLORHEXIDINE GLUCONATE 0.12 % MT SOLN: 15.0000 mL | Freq: Once | OROMUCOSAL | Status: DC

## 2020-01-08 MED ORDER — PROPOFOL 10 MG/ML IV BOLUS
INTRAVENOUS | Status: DC | PRN
  Administered 2020-01-08: 50 mg via INTRAVENOUS

## 2020-01-08 MED ORDER — FENTANYL CITRATE (PF) 100 MCG/2ML IJ SOLN
INTRAMUSCULAR | Status: DC | PRN
  Administered 2020-01-08: 25 ug via INTRAVENOUS

## 2020-01-08 MED ORDER — BISACODYL 10 MG RE SUPP: 10.0000 mg | Freq: Every day | RECTAL | Status: DC

## 2020-01-08 MED ORDER — ACETAMINOPHEN 160 MG/5ML PO SOLN: 1000.0000 mg | Freq: Four times a day (QID) | ORAL | Status: AC

## 2020-01-08 MED ORDER — METOPROLOL TARTRATE 25 MG PO TABS: 12.5000 mg | ORAL_TABLET | Freq: Two times a day (BID) | ORAL | 0 refills | Status: DC

## 2020-01-08 MED ORDER — HYDRALAZINE HCL 25 MG PO TABS: 25.0000 mg | ORAL_TABLET | Freq: Three times a day (TID) | ORAL | 0 refills | Status: DC | PRN

## 2020-01-08 MED ORDER — MILRINONE LACTATE IN DEXTROSE 20-5 MG/100ML-% IV SOLN
0.3000 ug/kg/min | INTRAVENOUS | Status: AC
  Administered 2020-01-08: .25 ug/kg/min via INTRAVENOUS
  Filled 2020-01-08: qty 100

## 2020-01-08 MED ORDER — PLASMA-LYTE 148 IV SOLN
INTRAVENOUS | Status: DC
  Filled 2020-01-08: qty 2.5

## 2020-01-08 MED ORDER — ROCURONIUM BROMIDE 10 MG/ML (PF) SYRINGE
PREFILLED_SYRINGE | INTRAVENOUS | Status: DC | PRN
  Administered 2020-01-08: 30 mg via INTRAVENOUS
  Administered 2020-01-08: 70 mg via INTRAVENOUS
  Administered 2020-01-08: 40 mg via INTRAVENOUS
  Administered 2020-01-08: 50 mg via INTRAVENOUS

## 2020-01-08 MED ORDER — DEXMEDETOMIDINE HCL IN NACL 400 MCG/100ML IV SOLN
0.1000 ug/kg/h | INTRAVENOUS | Status: AC
  Administered 2020-01-08: .4 ug/kg/h via INTRAVENOUS
  Filled 2020-01-08: qty 100

## 2020-01-08 MED ORDER — NITROGLYCERIN 0.4 MG SL SUBL: 0.4000 mg | SUBLINGUAL_TABLET | SUBLINGUAL | 12 refills | Status: DC | PRN

## 2020-01-08 MED ORDER — MAGNESIUM SULFATE 50 % IJ SOLN
40.0000 meq | INTRAMUSCULAR | Status: DC
  Filled 2020-01-08: qty 9.85

## 2020-01-08 MED ORDER — LIDOCAINE HCL (CARDIAC) PF 100 MG/5ML IV SOSY
PREFILLED_SYRINGE | INTRAVENOUS | Status: DC | PRN
  Administered 2020-01-08: 100 mg via INTRAVENOUS

## 2020-01-08 MED ORDER — BISACODYL 5 MG PO TBEC: 5.0000 mg | DELAYED_RELEASE_TABLET | Freq: Once | ORAL | Status: DC

## 2020-01-08 MED ORDER — PANTOPRAZOLE SODIUM 40 MG PO TBEC
40.0000 mg | DELAYED_RELEASE_TABLET | Freq: Every day | ORAL | Status: DC
  Administered 2020-01-10 – 2020-01-14 (×5): 40 mg via ORAL
  Filled 2020-01-08 (×5): qty 1

## 2020-01-08 MED ORDER — MORPHINE SULFATE (PF) 2 MG/ML IV SOLN: 1.0000 mg | INTRAVENOUS | Status: DC | PRN

## 2020-01-08 MED ORDER — SODIUM CHLORIDE 0.9 % IV SOLN: INTRAVENOUS | Status: DC

## 2020-01-08 MED ORDER — PLASMA-LYTE 148 IV SOLN
INTRAVENOUS | Status: DC | PRN
  Administered 2020-01-08: 500 mL via INTRAVASCULAR

## 2020-01-08 MED ORDER — SODIUM CHLORIDE 0.9 % IV SOLN
INTRAVENOUS | Status: DC
  Administered 2020-01-08: 10 mL/h via INTRAVENOUS

## 2020-01-08 MED ORDER — PRAVASTATIN SODIUM 20 MG PO TABS: 20.0000 mg | ORAL_TABLET | Freq: Every day | ORAL | Status: DC

## 2020-01-08 MED ORDER — 0.9 % SODIUM CHLORIDE (POUR BTL) OPTIME
TOPICAL | Status: DC | PRN
  Administered 2020-01-08: 1000 mL

## 2020-01-08 MED ORDER — MAGNESIUM SULFATE 4 GM/100ML IV SOLN
4.0000 g | Freq: Once | INTRAVENOUS | Status: AC
  Administered 2020-01-08: 4 g via INTRAVENOUS
  Filled 2020-01-08: qty 100

## 2020-01-08 MED ORDER — MILRINONE LACTATE IN DEXTROSE 20-5 MG/100ML-% IV SOLN
0.2500 ug/kg/min | INTRAVENOUS | Status: DC
  Administered 2020-01-10: 0.25 ug/kg/min via INTRAVENOUS
  Filled 2020-01-08 (×2): qty 100

## 2020-01-08 MED ORDER — TRAMADOL HCL 50 MG PO TABS
50.0000 mg | ORAL_TABLET | ORAL | Status: DC | PRN
  Administered 2020-01-09 – 2020-01-10 (×4): 100 mg via ORAL
  Filled 2020-01-08 (×4): qty 2

## 2020-01-08 SURGICAL SUPPLY — 106 items
ADAPTER CARDIO PERF ANTE/RETRO (ADAPTER) ×4 IMPLANT
ADH SKN CLS APL DERMABOND .7 (GAUZE/BANDAGES/DRESSINGS) ×2
ADPR PRFSN 84XANTGRD RTRGD (ADAPTER) ×2
AGENT HMST KT MTR STRL THRMB (HEMOSTASIS) ×2
BAG DECANTER FOR FLEXI CONT (MISCELLANEOUS) ×4 IMPLANT
BASKET HEART  (ORDER IN 25'S) (MISCELLANEOUS) ×1
BASKET HEART (ORDER IN 25'S) (MISCELLANEOUS) ×1
BASKET HEART (ORDER IN 25S) (MISCELLANEOUS) ×2 IMPLANT
BLADE CLIPPER SURG (BLADE) ×4 IMPLANT
BLADE MINI RND TIP GREEN BEAV (BLADE) ×2 IMPLANT
BLADE NDL 3 SS STRL (BLADE) IMPLANT
BLADE NEEDLE 3 SS STRL (BLADE) ×6 IMPLANT
BLADE NEEDLE 3MM SS STRL (BLADE) ×2
BLADE STERNUM SYSTEM 6 (BLADE) ×4 IMPLANT
BLADE SURG 11 STRL SS (BLADE) ×2 IMPLANT
BLADE SURG 12 STRL SS (BLADE) ×4 IMPLANT
BNDG CMPR MED 10X6 ELC LF (GAUZE/BANDAGES/DRESSINGS) ×2
BNDG ELASTIC 4X5.8 VLCR STR LF (GAUZE/BANDAGES/DRESSINGS) ×6 IMPLANT
BNDG ELASTIC 6X10 VLCR STRL LF (GAUZE/BANDAGES/DRESSINGS) ×2 IMPLANT
BNDG ELASTIC 6X5.8 VLCR STR LF (GAUZE/BANDAGES/DRESSINGS) ×4 IMPLANT
BNDG GAUZE ELAST 4 BULKY (GAUZE/BANDAGES/DRESSINGS) ×6 IMPLANT
CANISTER SUCT 3000ML PPV (MISCELLANEOUS) ×4 IMPLANT
CANNULA GUNDRY RCSP 15FR (MISCELLANEOUS) ×4 IMPLANT
CATH CPB KIT VANTRIGT (MISCELLANEOUS) ×4 IMPLANT
CATH ROBINSON RED A/P 18FR (CATHETERS) ×12 IMPLANT
CATH THORACIC 28FR RT ANG (CATHETERS) ×4 IMPLANT
CLIP RETRACTION 3.0MM CORONARY (MISCELLANEOUS) ×2 IMPLANT
CONN ST 1/4X3/8  BEN (MISCELLANEOUS) ×4
CONN ST 1/4X3/8 BEN (MISCELLANEOUS) IMPLANT
DERMABOND ADVANCED (GAUZE/BANDAGES/DRESSINGS) ×2
DERMABOND ADVANCED .7 DNX12 (GAUZE/BANDAGES/DRESSINGS) IMPLANT
DRAIN CHANNEL 32F RND 10.7 FF (WOUND CARE) ×4 IMPLANT
DRAPE CARDIOVASCULAR INCISE (DRAPES) ×4
DRAPE SLUSH/WARMER DISC (DRAPES) ×4 IMPLANT
DRAPE SRG 135X102X78XABS (DRAPES) ×2 IMPLANT
DRSG AQUACEL AG ADV 3.5X 4 (GAUZE/BANDAGES/DRESSINGS) ×2 IMPLANT
DRSG AQUACEL AG ADV 3.5X14 (GAUZE/BANDAGES/DRESSINGS) ×4 IMPLANT
DRSG COVADERM 4X10 (GAUZE/BANDAGES/DRESSINGS) ×2 IMPLANT
ELECT BLADE 4.0 EZ CLEAN MEGAD (MISCELLANEOUS) ×4
ELECT BLADE 6.5 EXT (BLADE) ×4 IMPLANT
ELECT CAUTERY BLADE 6.4 (BLADE) ×4 IMPLANT
ELECT REM PT RETURN 9FT ADLT (ELECTROSURGICAL) ×8
ELECTRODE BLDE 4.0 EZ CLN MEGD (MISCELLANEOUS) ×2 IMPLANT
ELECTRODE REM PT RTRN 9FT ADLT (ELECTROSURGICAL) ×4 IMPLANT
FELT TEFLON 1X6 (MISCELLANEOUS) ×6 IMPLANT
GAUZE SPONGE 4X4 12PLY STRL (GAUZE/BANDAGES/DRESSINGS) ×6 IMPLANT
GAUZE SPONGE 4X4 12PLY STRL LF (GAUZE/BANDAGES/DRESSINGS) ×4 IMPLANT
GLOVE BIO SURGEON STRL SZ 6 (GLOVE) ×2 IMPLANT
GLOVE BIO SURGEON STRL SZ7.5 (GLOVE) ×12 IMPLANT
GLOVE BIOGEL M STER SZ 6 (GLOVE) ×10 IMPLANT
GLOVE BIOGEL PI IND STRL 6.5 (GLOVE) IMPLANT
GLOVE BIOGEL PI IND STRL 7.0 (GLOVE) IMPLANT
GLOVE BIOGEL PI INDICATOR 6.5 (GLOVE) ×4
GLOVE BIOGEL PI INDICATOR 7.0 (GLOVE) ×6
GLOVE SURG SS PI 7.5 STRL IVOR (GLOVE) ×4 IMPLANT
GOWN STRL REUS W/ TWL LRG LVL3 (GOWN DISPOSABLE) ×8 IMPLANT
GOWN STRL REUS W/TWL LRG LVL3 (GOWN DISPOSABLE) ×40
HEMOSTAT POWDER SURGIFOAM 1G (HEMOSTASIS) ×12 IMPLANT
HEMOSTAT SURGICEL 2X14 (HEMOSTASIS) ×4 IMPLANT
KIT BASIN OR (CUSTOM PROCEDURE TRAY) ×4 IMPLANT
KIT SUCTION CATH 14FR (SUCTIONS) ×4 IMPLANT
KIT TURNOVER KIT B (KITS) ×4 IMPLANT
KIT VASOVIEW HEMOPRO 2 VH 4000 (KITS) ×4 IMPLANT
LEAD PACING MYOCARDI (MISCELLANEOUS) ×4 IMPLANT
MARKER GRAFT CORONARY BYPASS (MISCELLANEOUS) ×12 IMPLANT
MATRIX HEMOSTAT SURGIFLO (HEMOSTASIS) ×2 IMPLANT
NS IRRIG 1000ML POUR BTL (IV SOLUTION) ×20 IMPLANT
PACK E OPEN HEART (SUTURE) ×4 IMPLANT
PACK OPEN HEART (CUSTOM PROCEDURE TRAY) ×4 IMPLANT
PAD ARMBOARD 7.5X6 YLW CONV (MISCELLANEOUS) ×8 IMPLANT
PAD ELECT DEFIB RADIOL ZOLL (MISCELLANEOUS) ×4 IMPLANT
PENCIL BUTTON HOLSTER BLD 10FT (ELECTRODE) ×4 IMPLANT
POSITIONER HEAD DONUT 9IN (MISCELLANEOUS) ×4 IMPLANT
PUNCH AORTIC ROTATE  4.5MM 8IN (MISCELLANEOUS) ×2 IMPLANT
SET CARDIOPLEGIA MPS 5001102 (MISCELLANEOUS) ×2 IMPLANT
SPONGE LAP 18X18 RF (DISPOSABLE) ×4 IMPLANT
SPONGE LAP 4X18 RFD (DISPOSABLE) ×2 IMPLANT
SURGIFLO W/THROMBIN 8M KIT (HEMOSTASIS) ×4 IMPLANT
SUT BONE WAX W31G (SUTURE) ×4 IMPLANT
SUT MNCRL AB 4-0 PS2 18 (SUTURE) ×2 IMPLANT
SUT PROLENE 4 0 RB 1 (SUTURE) ×12
SUT PROLENE 4 0 SH DA (SUTURE) ×4 IMPLANT
SUT PROLENE 4-0 RB1 .5 CRCL 36 (SUTURE) ×2 IMPLANT
SUT PROLENE 6 0 C 1 30 (SUTURE) ×2 IMPLANT
SUT PROLENE 6 0 CC (SUTURE) ×14 IMPLANT
SUT PROLENE 8 0 BV175 6 (SUTURE) ×2 IMPLANT
SUT PROLENE BLUE 7 0 (SUTURE) ×4 IMPLANT
SUT PROLENE POLY MONO (SUTURE) ×6 IMPLANT
SUT SILK  1 MH (SUTURE) ×2
SUT SILK 1 MH (SUTURE) IMPLANT
SUT SILK 2 0 SH CR/8 (SUTURE) ×2 IMPLANT
SUT STEEL 6MS V (SUTURE) ×8 IMPLANT
SUT STEEL SZ 6 DBL 3X14 BALL (SUTURE) ×2 IMPLANT
SUT VIC AB 1 CTX 36 (SUTURE) ×8
SUT VIC AB 1 CTX36XBRD ANBCTR (SUTURE) ×4 IMPLANT
SUT VIC AB 2-0 CT1 27 (SUTURE) ×4
SUT VIC AB 2-0 CT1 TAPERPNT 27 (SUTURE) IMPLANT
SYSTEM SAHARA CHEST DRAIN ATS (WOUND CARE) ×4 IMPLANT
TAPE CLOTH SURG 4X10 WHT LF (GAUZE/BANDAGES/DRESSINGS) ×4 IMPLANT
TAPE PAPER 2X10 WHT MICROPORE (GAUZE/BANDAGES/DRESSINGS) ×2 IMPLANT
TOWEL GREEN STERILE (TOWEL DISPOSABLE) ×4 IMPLANT
TOWEL GREEN STERILE FF (TOWEL DISPOSABLE) ×4 IMPLANT
TRAY FOLEY SLVR 16FR TEMP STAT (SET/KITS/TRAYS/PACK) ×4 IMPLANT
TUBING LAP HI FLOW INSUFFLATIO (TUBING) ×4 IMPLANT
UNDERPAD 30X30 (UNDERPADS AND DIAPERS) ×4 IMPLANT
WATER STERILE IRR 1000ML POUR (IV SOLUTION) ×8 IMPLANT

## 2020-01-08 SURGICAL SUPPLY — 6 items
CATH 5F 110X4 TIG (CATHETERS) ×2 IMPLANT
DEVICE RAD TR BAND REGULAR (VASCULAR PRODUCTS) ×2 IMPLANT
GLIDESHEATH SLEND SS 6F .021 (SHEATH) ×2 IMPLANT
KIT MANI 3VAL PERCEP (MISCELLANEOUS) ×3 IMPLANT
PACK CARDIAC CATH (CUSTOM PROCEDURE TRAY) ×3 IMPLANT
WIRE ROSEN-J .035X260CM (WIRE) ×2 IMPLANT

## 2020-01-08 NOTE — Progress Notes (Signed)
Dr. Fletcher Anon at bedside, speaking with pt. Re: catheterization procedure. Pt. Verbalized understanding of conversation & agreeable to procedure.

## 2020-01-08 NOTE — Progress Notes (Signed)
[facility]  STAFF X 3 HERE TO TRANSPORT PT. TO [facility] . Pt. Stable for tranfer to [facility] in [place], [place]. TR band to right wrist intact with 8 ml air: no complications at site.

## 2020-01-08 NOTE — Progress Notes (Signed)
Patient is not returning to room.   Patient wife came to floor to get belongings.   Wife took wallet and put in her purse and clothing was placed in a belongings bag.

## 2020-01-08 NOTE — Telephone Encounter (Signed)
Per chart review tab pt went to [facility] ED and admitted on 01/07/2020.

## 2020-01-08 NOTE — Brief Op Note (Signed)
01/08/2020  4:54 PM  PATIENT:  [name]  78 y.o. male  PRE-OPERATIVE DIAGNOSIS:  CORONARY ARTERY DISEASE, LEFT MAIN STENOSIS, ACUTE NSTEMI   POST-OPERATIVE DIAGNOSIS:   CORONARY ARTERY DISEASE, LEFT MAIN STENOSIS, ACUTE NSTEMI  PROCEDURE:   CORONARY ARTERY BYPASS GRAFTING (CABG) x 4  WITH ENDOSCOPIC HARVESTING OF LEFT AND RIGHT GREATER SAPHENOUS VEIN  LIMA->LAD SVG->OM1->OM2 SVG->PDA   TRANSESOPHAGEAL ECHOCARDIOGRAM   SURGEON:  Surgeon(s) and Role:    Ivin Poot, MD - Primary  PHYSICIAN ASSISTANT: Michael Litter  ANESTHESIA:   general  BLOOD ADMINISTERED:none  DRAINS: Mediastinal and left Pleural drains   LOCAL MEDICATIONS USED:  NONE  SPECIMEN:  No Specimen  DISPOSITION OF SPECIMEN:  N/A  COUNTS:  YES  DICTATION: .Dragon Dictation  PLAN OF CARE: Admit to inpatient   PATIENT DISPOSITION:  ICU - intubated and hemodynamically stable.   Delay start of Pharmacological VTE agent (>24hrs) due to surgical blood loss or risk of bleeding: yes

## 2020-01-08 NOTE — Interval H&P Note (Signed)
Cath Lab Visit (complete for each Cath Lab visit)  Clinical Evaluation Leading to the Procedure:   ACS: Yes.       History and Physical Interval Note:  01/08/2020 10:13 AM  [name]  has presented today for surgery, with the diagnosis of NSTEMI.  The various methods of treatment have been discussed with the patient and family. After consideration of risks, benefits and other options for treatment, the patient has consented to  Procedure(s): LEFT HEART CATH AND CORONARY ANGIOGRAPHY (N/A) as a surgical intervention.  The patient's history has been reviewed, patient examined, no change in status, stable for surgery.  I have reviewed the patient's chart and labs.  Questions were answered to the patient's satisfaction.     Kathlyn Sacramento

## 2020-01-08 NOTE — Transfer of Care (Signed)
Immediate Anesthesia Transfer of Care Note  Patient: [name]  Procedure(s) Performed: CORONARY ARTERY BYPASS GRAFTING (CABG) x 4  WITH ENDOSCOPIC HARVESTING OF BILATERAL GREATER SAPHENOUS VEINS (N/A Chest) TRANSESOPHAGEAL ECHOCARDIOGRAM (TEE) (N/A )  Patient Location: ICU  Anesthesia Type:General  Level of Consciousness: sedated and Patient remains intubated per anesthesia plan  Airway & Oxygen Therapy: Patient remains intubated per anesthesia plan and Patient placed on Ventilator (see vital sign flow sheet for setting)  Post-op Assessment: Report given to RN and Post -op Vital signs reviewed and stable  Post vital signs: Reviewed and stable  Last Vitals:  Vitals Value Taken Time  BP    Temp 36.9 C 01/08/20 1925  Pulse    Resp 14 01/08/20 1925  SpO2    Vitals shown include unvalidated device data.  Last Pain: There were no vitals filed for this visit.       Complications: No apparent anesthesia complications

## 2020-01-08 NOTE — Consult Note (Signed)
[address]       [place] [address]             [phone]        Richard F Sibal [place] Medical Record [id number] Date of Birth: Jun 21, 1941  Referring: Dr. Midge Minium  primary Care: Venia Carbon, MD Primary Cardiologist:No primary care provider on file.  Chief Complaint:   No chief complaint on file. Chest pain shortness of breath, non-STEMI  History of Present Illness:     Patient examined, images of coronary angiograms and CTA images personally reviewed and discussed with Dr. Fletcher Anon for coordination of care.  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  He was admitted yesterday through the ED with positive cardiac enzymes and placed on heparin.  He underwent cardiac catheterization today which shows a 99% left main stenosis and 99% ostial RCA stenosis.  EF is 45% with LVEDP of 22 mmHg.  Patient was transferred to this hospital for urgent-emergent coronary artery bypass surgery.  I agree with the recommendation for coronary bypass surgery and discussed the procedure in detail with the patient including the expected benefits and risks.  The patient appears to have adequate but small targets for grafting and adequate LV function with EF 45%.  Patient had preoperative Doppler ultrasound exam which shows no significant carotid disease.  His preoperative chest x-ray shows mild interstitial edema.    Current Activity/ Functional Status: Patient is highly functional at home lives with significant other   Zubrod Score: At the time of surgery this patient's most appropriate activity status/level should be described as: []     0    Normal activity, no symptoms []     1    Restricted in physical strenuous activity but ambulatory, able to do out light work [x]     2    Ambulatory and capable of self care, unable to  do work activities, up and about                 more than 50%  Of the time                            []     3    Only limited self care, in bed greater than 50% of waking hours []     4    Completely disabled, no self care, confined to bed or chair []     5    Moribund  Past Medical History:  Diagnosis Date  . Arthritis   . Bone spur 2010   right shoulder  . Hyperlipidemia   . Hypertension   . IBS (irritable bowel syndrome)     Past Surgical History:  Procedure Laterality Date  . BACK SURGERY     Lumbar  . BUNIONECTOMY    . HEMORRHOID SURGERY  2004  . INGUINAL HERNIA REPAIR  02/11  . ROTATOR CUFF REPAIR  07/11   left/ bone spur Dr.Blackman  . SHOULDER ARTHROSCOPY WITH OPEN ROTATOR CUFF REPAIR Right 05/26/2018   Procedure: SHOULDER ARTHROSCOPY WITH OPEN ROTATOR CUFF REPAIR;  Surgeon: Corky Mull, MD;  Location: [facility] ORS;  Service: Orthopedics;  Laterality: Right;  debridement, decompression  . 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 Alcohol/week: 1.0 standard drinks  . Types: 1 Glasses of wine per week     Allergies  Allergen Reactions  . Crestor [Rosuvastatin] Other (See Comments)    Severe myalgias  . Penicillins Rash  . Atorvastatin Other (See Comments)    ache  . Simvastatin Other (See Comments)    aching    Current Facility-Administered Medications  Medication Dose Route Frequency Provider Last Rate Last Admin  . bisacodyl (DULCOLAX) EC tablet 5 mg  5 mg Oral Once Prescott Gum, Collier Salina, MD      . chlorhexidine (HIBICLENS) 4 % liquid 1 application  1 application Topical Once Prescott Gum, Collier Salina, MD      . Derrill Memo ON 01/09/2020] chlorhexidine (PERIDEX) 0.12 % solution 15 mL  15 mL Mouth/Throat Once Prescott Gum, Collier Salina, MD      . dexmedetomidine (PRECEDEX) 400 MCG/100ML (4 mcg/mL) infusion  0.1-0.7  mcg/kg/hr Intravenous To OR Prescott Gum, Collier Salina, MD      . EPINEPHrine (ADRENALIN) 4 mg in dextrose 5 % 250 mL (0.016 mg/mL) infusion  0-10 mcg/min Intravenous To OR Prescott Gum, Collier Salina, MD      . heparin 2,500 Units, papaverine 30 mg in electrolyte-148 (PLASMALYTE-148) 500 mL irrigation   Irrigation To OR Prescott Gum, Collier Salina, MD      . heparin 30,000 units/NS 1000 mL solution for CELLSAVER   Other To OR Prescott Gum, Collier Salina, MD      . insulin regular, human (MYXREDLIN) 100 units/ 100 mL infusion   Intravenous To OR Prescott Gum, Collier Salina, MD      . levofloxacin (LEVAQUIN) IVPB 500 mg  500 mg Intravenous To OR Prescott Gum, Collier Salina, MD      . magnesium sulfate (IV Push/IM) injection 40 mEq  40 mEq Other To OR Prescott Gum, Collier Salina, MD      . milrinone (PRIMACOR) 20 MG/100 ML (0.2 mg/mL) infusion  0.3 mcg/kg/min Intravenous To OR Prescott Gum, Collier Salina, MD      . nitroGLYCERIN 50 mg in dextrose 5 % 250 mL (0.2 mg/mL) infusion  2-200 mcg/min Intravenous To OR Prescott Gum, Collier Salina, MD      . norepinephrine (LEVOPHED) 4mg  in 29mL premix infusion  0-40 mcg/min Intravenous To OR Prescott Gum, Collier Salina, MD      . phenylephrine (NEOSYNEPHRINE) 20-0.9 MG/250ML-% infusion  30-200 mcg/min Intravenous To OR Prescott Gum, Collier Salina, MD      . potassium chloride injection 80 mEq  80 mEq Other To OR Prescott Gum, Collier Salina, MD      . temazepam (RESTORIL) capsule 15 mg  15 mg Oral Once PRN Prescott Gum, Collier Salina, MD      . tranexamic acid (CYKLOKAPRON) 2,500 mg in sodium chloride 0.9 % 250 mL (10 mg/mL) infusion  1.5 mg/kg/hr Intravenous To OR Prescott Gum, Collier Salina, MD      . tranexamic acid (CYKLOKAPRON) bolus via infusion - over 30 minutes 954 mg  15 mg/kg Intravenous To OR Prescott Gum, Collier Salina, MD      . tranexamic acid (CYKLOKAPRON) pump prime solution 127 mg  2 mg/kg Intracatheter To OR Prescott Gum, Collier Salina, MD      . vancomycin Alcus Dad) IVPB 1250 mg/250 mL  1,250 mg Intravenous To OR Ivin Poot, MD       Facility-Administered Medications Ordered in Other Encounters    Medication Dose Route Frequency Provider Last Rate Last  Admin  . lactated ringers infusion   Intravenous Continuous PRN Oletta Lamas, CRNA   New Bag at 01/08/20 1231  . lactated ringers infusion   Intravenous Continuous PRN Oletta Lamas, CRNA   New Bag at 01/08/20 1231  . lactated ringers infusion   Intravenous Continuous PRN Oletta Lamas, CRNA   New Bag at 01/08/20 1232    Medications Prior to Admission  Medication Sig Dispense Refill Last Dose  . ceFEPIme 2 g in sodium chloride 0.9 % 100 mL Inject 2 g into the vein every 12 (twelve) hours. 3 each 0   . heparin 25000-0.45 UT/250ML-% infusion Inject 1,050 Units/hr into the vein continuous.     . hydrALAZINE (APRESOLINE) 25 MG tablet Take 1 tablet (25 mg total) by mouth 3 (three) times daily as needed (for SBP>165). 30 tablet 0   . metoprolol tartrate (LOPRESSOR) 25 MG tablet Take 0.5 tablets (12.5 mg total) by mouth 2 (two) times daily. 30 tablet 0   . nitroGLYCERIN (NITROSTAT) 0.4 MG SL tablet Place 1 tablet (0.4 mg total) under the tongue every 5 (five) minutes as needed for chest pain.  12   . pravastatin (PRAVACHOL) 20 MG tablet Take 1 tablet (20 mg total) by mouth daily at 6 PM.       Family History  Problem Relation Age of Onset  . Cancer Neg Hx   . Diabetes Neg Hx      Review of Systems:   ROS Right-hand-dominant No previous thoracic trauma No history of bleeding disorder or blood transfusions No history of DVT claudication or varicose veins    Cardiac Review of Systems: Y or  [    ]= no  Chest Pain [ y   ]  Resting SOB [   ] Exertional SOB  Blue.Reese  ]  Orthopnea [  ]   Pedal Edema [   ]    Palpitations [  ] Syncope  [  ]   Presyncope [  y ]  General Review of Systems: [Y] = yes [  ]=no Constitional: recent weight change [  ]; anorexia [  ]; fatigue Blue.Reese  ]; nausea [  ]; night sweats [  ]; fever [  ]; or chills [  ]                                                               Dental: Last Dentist visit:   Eye :  blurred vision [  ]; diplopia [   ]; vision changes [  ];  Amaurosis fugax[  ]; Resp: cough [  ];  wheezing[  ];  hemoptysis[  ]; shortness of breath[  ]; paroxysmal nocturnal dyspnea[  ]; dyspnea on exertion[  ]; or orthopnea[  ];  GI:  gallstones[  ], vomiting[  ];  dysphagia[  ]; melena[  ];  hematochezia [  ]; heartburn[  ];   Hx of  Colonoscopy[  ]; GU: kidney stones [  ]; hematuria[  ];   dysuria [  ];  nocturia[  ];  history of     obstruction [  ]; urinary frequency [  ]             Skin: rash, swelling[  ];, hair loss[  ];  peripheral edema[  ];  or itching[  ]; Musculosketetal: myalgias[  ];  joint swelling[  ];  joint erythema[  ];  joint pain[  ];  back pain[  ];  Heme/Lymph: bruising[  ];  bleeding[  ];  anemia[  ];  Neuro: TIA[  ];  headaches[  ];  stroke[  ];  vertigo[  ];  seizures[  ];   paresthesias[  ];  difficulty walking[  ];  Psych:depression[  ]; anxiety[  ];  Endocrine: diabetes[  ];  thyroid dysfunction[  ];               Physical Exam: BP (!) 138/58   Pulse 69   Resp 13   SpO2 92%       Physical Exam  General: 79 year old small thin male alert and responsive no acute distress HEENT: Normocephalic pupils equal , dentition adequate Neck: Supple without JVD, adenopathy, or bruit Chest: Clear to auscultation, symmetrical breath sounds, no rhonchi, no tenderness             or deformity Cardiovascular: Regular rate and rhythm, no murmur, no gallop, peripheral pulses             palpable in all extremities Abdomen:  Soft, nontender, no palpable mass or organomegaly Extremities: Warm, well-perfused, no clubbing cyanosis edema or tenderness,              no venous stasis changes of the legs Rectal/GU: Deferred Neuro: Grossly non--focal and symmetrical throughout Skin: Clean and dry without rash or ulceration    Diagnostic Studies & Laboratory data:     Recent Radiology Findings:   CT Angio Chest PE W and/or Wo Contrast  Result Date: 01/07/2020 CLINICAL  DATA:  Short of breath. EXAM: CT ANGIOGRAPHY CHEST WITH CONTRAST TECHNIQUE: Multidetector CT imaging of the chest was performed using the standard protocol during bolus administration of intravenous contrast. Multiplanar CT image reconstructions and MIPs were obtained to evaluate the vascular anatomy. CONTRAST:  69mL OMNIPAQUE IOHEXOL 350 MG/ML SOLN COMPARISON:  None. FINDINGS: Cardiovascular: No filling defects within the pulmonary arteries arteries to suggest acute pulmonary embolism. Coronary artery calcification and aortic atherosclerotic calcification. Mediastinum/Nodes: No axillary supraclavicular adenopathy. No discrete enlarged mediastinal lymph nodes. Lungs/Pleura: Large bilateral layering pleural effusions. Mild ground-glass perihilar opacities in the LEFT and RIGHT upper lobe. Mild basilar atelectasis related to the large effusions. Pulmonary infarction. Upper Abdomen: Limited view of the liver, kidneys, pancreas are unremarkable. Normal adrenal glands. Musculoskeletal: No aggressive osseous lesion Review of the MIP images confirms the above findings. IMPRESSION: 1. No acute pulmonary embolism. 2. Large bilateral layering pleural effusions. 3. Ground-glass opacities in the upper lobes likely represent mild edema. 4. Coronary artery calcification and Aortic Atherosclerosis (ICD10-I70.0). Electronically Signed   By: Suzy Bouchard M.D.   On: 01/07/2020 11:38   CARDIAC CATHETERIZATION  Result Date: 01/08/2020  Ost LM to Mid LM lesion is 99% stenosed.  1st Mrg lesion is 80% stenosed.  Mid LAD lesion is 40% stenosed.  LV end diastolic pressure is moderately elevated.  The left ventricular ejection fraction is 45-50% by visual estimate.  There is mild left ventricular systolic dysfunction.  Prox RCA lesion is 99% stenosed.  Ost RCA to Prox RCA lesion is 70% stenosed.  Dist RCA lesion is 30% stenosed.  1.  Critical left main and RCA disease. 2.  Mildly reduced LV systolic function with an EF of  45%. 3.  Moderately elevated left ventricular end-diastolic pressure. Recommendations: Recommend emergent CABG.  I discussed with Dr. Prescott Gum and the patient will be transferred to [facility].   DG Chest Portable 1 View  Result Date: 01/07/2020 CLINICAL DATA:  Shortness of breath, hemoptysis EXAM: PORTABLE CHEST 1 VIEW COMPARISON:  None. FINDINGS: The heart size and mediastinal contours are within normal limits. Prominent interstitial markings throughout both lungs. Small right pleural effusion. No pneumothorax. Degenerative changes of the thoracic spine. IMPRESSION: 1. Prominent interstitial markings throughout both lungs. Findings Bockrath reflect chronic bronchitic type lung changes and/or an underlying chronic interstitial lung disease. 2. Small right pleural effusion. Electronically Signed   By: Davina Poke D.O.   On: 01/07/2020 10:50   VAS US DOPPLER PRE CABG  Result Date: 01/08/2020 PREOPERATIVE VASCULAR EVALUATION  Indications:      Pre-CABG. Risk Factors:     Hypertension, hyperlipidemia. Comparison Study: No prior study. Performing Technologist: Maudry Mayhew MHA, RDMS, RVT, RDCS  Examination Guidelines: A complete evaluation includes B-mode imaging, spectral Doppler, color Doppler, and power Doppler as needed of all accessible portions of each vessel. Bilateral testing is considered an integral part of a complete examination. Limited examinations for reoccurring indications Avalos be performed as noted.  Right Carotid Findings: +----------+--------+-------+--------+--------------------------------+--------+           PSV cm/sEDV    StenosisDescribe                        Comments                   cm/s                                                    +----------+--------+-------+--------+--------------------------------+--------+ CCA Prox  112     12                                                       +----------+--------+-------+--------+--------------------------------+--------+ CCA Distal116     13             smooth and heterogenous                  +----------+--------+-------+--------+--------------------------------+--------+ ICA Prox  59      7              smooth, heterogenous and                                                  calcific                                 +----------+--------+-------+--------+--------------------------------+--------+ ICA Distal96      16                                                      +----------+--------+-------+--------+--------------------------------+--------+ ECA       87  3                                                       +----------+--------+-------+--------+--------------------------------+--------+ Portions of this table do not appear on this page. +----------+--------+-------+----------------+------------+           PSV cm/sEDV cmsDescribe        Arm Pressure +----------+--------+-------+----------------+------------+ [id number]             Multiphasic, WNL             +----------+--------+-------+----------------+------------+ +---------+--------+--+--------+-+---------+ VertebralPSV cm/s56EDV cm/s7Antegrade +---------+--------+--+--------+-+---------+ Left Carotid Findings: +----------+-------+-------+--------+---------------------------------+--------+           PSV    EDV    StenosisDescribe                         Comments           cm/s   cm/s                                                     +----------+-------+-------+--------+---------------------------------+--------+ CCA Prox  92     13             smooth and heterogenous                   +----------+-------+-------+--------+---------------------------------+--------+ CCA Distal138    19             smooth and heterogenous                    +----------+-------+-------+--------+---------------------------------+--------+ ICA Prox  100    14             smooth and heterogenous                   +----------+-------+-------+--------+---------------------------------+--------+ ICA Distal71     19                                                       +----------+-------+-------+--------+---------------------------------+--------+ ECA       207    14             heterogenous, irregular and                                               calcific                                  +----------+-------+-------+--------+---------------------------------+--------+ +----------+--------+--------+----------------+------------+ SubclavianPSV cm/sEDV cm/sDescribe        Arm Pressure +----------+--------+--------+----------------+------------+           137             Multiphasic, WNL             +----------+--------+--------+----------------+------------+ +---------+--------+--+--------+--+---------+ VertebralPSV cm/s75EDV cm/s17Antegrade +---------+--------+--+--------+--+---------+  ABI Findings: +--------+------------------+-----+--------+--------+ Right   Rt Pressure (  mmHg)IndexWaveformComment  +--------+------------------+-----+--------+--------+ Brachial                       biphasic         +--------+------------------+-----+--------+--------+ +--------+------------------+-----+--------+-------+ Left    Lt Pressure (mmHg)IndexWaveformComment +--------+------------------+-----+--------+-------+ Brachial                       biphasic        +--------+------------------+-----+--------+-------+  Right Doppler Findings: +-----------+--------+-----+--------+------------------------------------------+ Site       PressureIndexDoppler Comments                                   +-----------+--------+-----+--------+------------------------------------------+ Brachial                biphasic                                            +-----------+--------+-----+--------+------------------------------------------+ Radial                  biphasic                                           +-----------+--------+-----+--------+------------------------------------------+ Ulnar                   biphasic                                           +-----------+--------+-----+--------+------------------------------------------+ Palmar Arch                     Unable to evaluate due to recent                                           catheterization                            +-----------+--------+-----+--------+------------------------------------------+  Left Doppler Findings: +--------+--------+-----+--------+--------+ Site    PressureIndexDoppler Comments +--------+--------+-----+--------+--------+ Brachial             biphasic         +--------+--------+-----+--------+--------+ Radial               biphasic         +--------+--------+-----+--------+--------+ Ulnar                biphasic         +--------+--------+-----+--------+--------+  Summary: Right Carotid: Velocities in the right ICA are consistent with a 1-39% stenosis. Left Carotid: Velocities in the left ICA are consistent with a 1-39% stenosis. Vertebrals:  Bilateral vertebral arteries demonstrate antegrade flow. Subclavians: Normal flow hemodynamics were seen in bilateral subclavian              arteries. Left Upper Extremity: Doppler waveform obliterate with left radial compression. Doppler waveforms remain within normal limits with left ulnar compression.    Preliminary      I have independently reviewed the above radiologic studies and discussed with the patient  Recent Lab Findings: Lab Results  Component Value Date   WBC 11.1 (H) 01/08/2020   HGB 11.6 (L) 01/08/2020   HCT 34.8 (L) 01/08/2020   PLT 171 01/08/2020   GLUCOSE 102 (H) 01/08/2020   CHOL 152 01/08/2020   TRIG 64 01/08/2020    HDL 34 (L) 01/08/2020   LDLDIRECT 188.8 04/05/2012   LDLCALC 105 (H) 01/08/2020   ALT 19 05/25/2016   AST 22 05/25/2016   NA 136 01/08/2020   K 4.1 01/08/2020   CL 105 01/08/2020   CREATININE 0.99 01/08/2020   BUN 11 01/08/2020   CO2 21 (L) 01/08/2020   TSH 2.687 01/08/2020   INR 0.9 01/07/2020   HGBA1C 5.4 01/08/2020      Assessment / Plan:   Severe left main and three-vessel CAD, unstable angina Moderate LV dysfunction Hypertension Non-ST elevation MI  Has been referred from [facility] for urgent-emergent multivessel CABG.  This will be performed today as the operating room is being prepared.  Patient understands the benefits and risks of surgery.  He agrees.     @ME1 @ 01/08/2020 1:10 PM      Really well

## 2020-01-08 NOTE — Consult Note (Signed)
ANTICOAGULATION CONSULT NOTE - Initial Consult  Pharmacy Consult for Heparin infusion Indication: chest pain/ACS - restart 8 hours post sheath removal.   Allergies  Allergen Reactions   Crestor [Rosuvastatin] Other (See Comments)    Severe myalgias   Penicillins Rash   Atorvastatin Other (See Comments)    ache   Simvastatin Other (See Comments)    aching    Patient Measurements: Height: 5\' 7"  (170.2 cm) Weight: 140 lb 3.4 oz (63.6 kg) IBW/kg (Calculated) : 66.1 Heparin Dosing Weight: 61.2 kg  Vital Signs: Temp: 97.7 F (36.5 C) (01/18 0915) Temp Source: Oral (01/18 0915) BP: 126/63 (01/18 0915) Pulse Rate: 65 (01/18 0915)  Labs: Recent Labs    01/07/20 1030 01/07/20 1030 01/07/20 1248 01/07/20 1251 01/07/20 1425 01/07/20 1634 01/07/20 1918 01/07/20 2045 01/07/20 2255 01/08/20 0612 01/08/20 0613 01/08/20 0841  HGB 14.0  --   --   --   --   --   --   --   --   --  11.6*  --   HCT 40.8  --   --   --   --   --   --   --   --   --  34.8*  --   PLT 233  --   --   --   --   --   --   --   --   --  171  --   APTT  --   --   --  31  --   --   --   --   --   --   --   --   LABPROT  --   --   --  12.4  --   --   --   --   --   --   --   --   INR  --   --   --  0.9  --   --   --   --   --   --   --   --   HEPARINUNFRC  --   --   --   --   --   --   --  <0.10*  --  0.23*  --   --   CREATININE 0.91   < >  --   --  0.92  --   --   --  0.81 0.99  --   --   TROPONINIHS 5,807*  --    < >  --   --  [id number]* 5,775*  --   --   --   --  4,392*   < > = values in this interval not displayed.    Estimated Creatinine Clearance: 55.3 mL/min (by C-G formula based on SCr of 0.99 mg/dL).   Medical History: Past Medical History:  Diagnosis Date   Arthritis    Bone spur 2010   right shoulder   Hyperlipidemia    Hypertension    IBS (irritable bowel syndrome)     Medications:  No anticoagulation prior to admission  Assessment: Patient is a 79 y/o M who presented to [facility] ED 1/17  with chief complaint of midsternal chest pain and shortness of breath. Troponin elevated to 5807. EKG pending interpretation. CTA negative for PE. Pharmacy has been consulted to start heparin infusion for acute coronary syndrome. Baseline H&H and platelets within normal limits. Baseline aPTT 31, INR 0.9. Sheath removed at 10:35.   Goal of Therapy:  Heparin level  0.3-0.7 units/ml Monitor platelets by anticoagulation protocol: Yes   Plan:  Planned to restart heparin at 1830.  Will restart rate at 1050 units/hr and will recheck HL @ 0200, CBC trending down will continue to monitor. Pt is to be transferred to [facility] for CABG.   Eleonore Chiquito, PharmD, BCPS Clinical Pharmacist 01/08/2020,10:52 AM

## 2020-01-08 NOTE — Telephone Encounter (Signed)
[facility] Night - Client TELEPHONE ADVICE RECORD [facility] Patient Name: [name] Gender: Male DOB: Feb 28, 1941 Age: 79 Y 74 M 11 D Return Phone Number: [phone] (Primary) Address: City/State/Zip: [place] [address] Client [facility] Night - Client Client Site [place] Physician Viviana Simpler - MD Contact Type Call Who Is Calling Patient / Member / Family / Caregiver Call Type Triage / Clinical Caller Name Bartolo Darter Relationship To Patient Partner Return Phone Number [phone] (Primary) Chief Complaint CHEST PAIN (>=21 years) - pain, pressure, heaviness or tightness Reason for Call Symptomatic / Request for [place] states her partner Lachman have pulled a muscle. Having shortness of breath and chest pain. Translation No Nurse Assessment Nurse: Windle Guard, RN, Olin Hauser Date/Time (Eastern Time): 01/07/2020 9:16:32 AM Confirm and document reason for call. If symptomatic, describe symptoms. ---Caller states her partner is having shortness of breath and chest pain. Started on Friday after shoveling / digging a ditch. Chest pain started shortly after. Shortness of breath with activity. Has the patient had close contact with a person known or suspected to have the novel coronavirus illness OR traveled / lives in area with major community spread (including international travel) in the last 14 days from the onset of symptoms? * If Asymptomatic, screen for exposure and travel within the last 14 days. ---No Does the patient have any new or worsening symptoms? ---Yes Will a triage be completed? ---Yes Related visit to physician within the last 2 weeks? ---No Does the PT have any chronic conditions? (i.e. diabetes, asthma, this includes High risk factors for pregnancy, etc.) ---Yes List chronic conditions. ---HTN, High Cholesterol Is this a behavioral health or  substance abuse call? ---No Guidelines Guideline Title Affirmed Question Affirmed Notes Nurse Date/Time Eilene Ghazi Time) Chest Pain [1] Chest pain lasts > 5 minutes AND [2] age > 60 Windle Guard, RN, Olin Hauser 01/07/2020 9:21:26 AM PLEASE NOTE: All timestamps contained within this report are represented as [place] Standard Time. CONFIDENTIALTY NOTICE: This fax transmission is intended only for the addressee. It contains information that is legally privileged, confidential or otherwise protected from use or disclosure. If you are not the intended recipient, you are strictly prohibited from reviewing, disclosing, copying using or disseminating any of this information or taking any action in reliance on or regarding this information. If you have received this fax in error, please notify us immediately by telephone so that we can arrange for its return to [place]. Phone: [phone], Toll-Free: [phone], Fax: [phone] Page: 2 of 2 Call Id: [id number] [place]. Time Eilene Ghazi Time) Disposition Final User 01/07/2020 9:15:35 AM Send to Urgent Queue Mariann Laster 01/07/2020 9:23:42 AM Call EMS 911 Now Conner, RN, Olin Hauser 01/07/2020 9:38:16 AM 911 Outcome Documentation Yes Conner, RN, Olin Hauser Reason: Did not call 911, is at Fire Dept to see if they will check him Caller Disagree/Comply Comply Caller Understands Yes PreDisposition InappropriateToAsk Care Advice Given Per Guideline CALL EMS 911 NOW: * Immediate medical attention is needed. You need to hang up and call 911 (or an ambulance). * Triager Discretion: I'll call you back in a few minutes to be sure you were able to reach them. CARE ADVICE given per Chest Pain (Adult) guideline. Comments User: Harvin Hazel, RN Date/Time Eilene Ghazi Time): 01/07/2020 9:28:59 AM caller states patient's BP 136/72 Hr 77

## 2020-01-08 NOTE — Anesthesia Procedure Notes (Signed)
Procedure Name: Intubation Date/Time: 01/08/2020 1:30 PM Performed by: Oletta Lamas, CRNA Pre-anesthesia Checklist: Patient identified, Emergency Drugs available, Suction available and Patient being monitored Patient Re-evaluated:Patient Re-evaluated prior to induction Oxygen Delivery Method: Circle System Utilized Preoxygenation: Pre-oxygenation with 100% oxygen Induction Type: IV induction Ventilation: Mask ventilation without difficulty Laryngoscope Size: Mac and 4 Grade View: Grade II Tube type: Oral Number of attempts: 1 Airway Equipment and Method: Stylet and Oral airway Placement Confirmation: ETT inserted through vocal cords under direct vision,  positive ETCO2 and breath sounds checked- equal and bilateral Secured at: 23 cm Tube secured with: Tape Dental Injury: Teeth and Oropharynx as per pre-operative assessment

## 2020-01-08 NOTE — Plan of Care (Signed)

## 2020-01-08 NOTE — Progress Notes (Signed)
Dr. Fletcher Anon at bedside, discussing cath results with pt. And his significant other Bernice  At bedside. Made aware emergent need for CABG. PT. Agreeable to transfer. Pt. S.O. Oris Drone went to pt. Room and collected pt. Belongings, including $52 cash that pt. would not let staff lock up.Pt. wallet , clothes, shoes, watch given to Oris Drone to take home. Report given to [facility] staff en route to [facility] now.

## 2020-01-08 NOTE — Discharge Summary (Signed)
Physician Discharge Summary  Patient ID: [name] MRN: [id number] DOB/AGE: [birth year] 79 y.o.  Admit date: 01/07/2020 Discharge date: 01/08/2020  Admission Diagnoses:  Discharge Diagnoses:  Principal Problem:   Chest pain Active Problems:   Bilateral pleural effusion   NSTEMI (non-ST elevated myocardial infarction) (HCC)   Hyponatremia   Hypertension   Hyperlipidemia   Discharged Condition: critical  Hospital Course:  Mr. Martorano is a 79 year old male with history of hypertension and hyperlipidemia who presents due to chest pain and shortness of breath.  Symptoms of shortness of breath have been going on and off for the past 5 days.  He has been digging a ditch at his home and felt like he overdid it the past 3 days or so.  2 days ago while he was struggling to finish taking, he felt shortness of breath towards the end.  Later on that night at about 2 AM in the morning, he woke up with chest heaviness which he rates as a 5 out of 10.  He took a baby aspirin with some improvement in symptoms and later on went to sleep.  Yesterday similar symptoms occurred at about 2 AM.  He attributed it to indigestion and later went to sleep.  Today due to persistent shortness of breath and chest discomfort the prior days, he went to the local fire department where his vitals were checked.  Upon hearing his symptoms, he was recommended to come to the emergency room.  His younger brother had bypass at age 71.  His father had a heart attack in his 68s.  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 -  The patient needs emergent CABG. Dr. Fletcher Anon surgeon at the [facility] who accepted the patient. 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  Patient  does not have fever, but has leukocytosis with WBC 14.1 - Will cont empiric Abx cefepime; Carvalho switch  -f/u Blood culture -- so far negative  -[place] consider thoracentesis after cardiac cath is performed and patient stabilized.  HTN:  -Amlodipine, metoprolol -hydralazine prn  Consults: cardiology  Significant Diagnostic Studies: Cardiac catheterization, imaging and blood work  Treatments: Per hospital course and discharge med list  Discharge Exam: Blood pressure 121/61, pulse 62, temperature 97.7 F (36.5 C), resp. rate 20, height 5\' 7"  (1.702 m), weight 63.6 kg, SpO2 96 %.  Unchanged from admission PEx   Disposition: Discharge disposition: 02-Transferred to [facility]      Transferred to [facility] facility for emergent CABG.  Patient has been accepted by the cardiothoracic surgeon at the facility Discharge Instructions    Bed rest   Complete by: As directed    Home infusion instructions   Complete by: As directed    Instructions: Flushing of vascular access device: 0.9% NaCl pre/post medication administration and prn patency; Heparin 100 u/ml, 2ml for implanted ports and Heparin 10u/ml, 59ml for all other central venous catheters.     Allergies as of 01/08/2020      Reactions   Crestor [rosuvastatin] Other (See Comments)   Severe myalgias   Penicillins Rash   Atorvastatin Other (See Comments)   ache   Simvastatin Other (See Comments)   aching  Medication List    STOP taking these medications   acetaminophen 650 MG CR tablet Commonly known as: TYLENOL   amLODipine 5 MG tablet Commonly known as: NORVASC   CoQ10 200 MG Caps   Fish Oil 1000 MG Caps   lovastatin 20 MG tablet Commonly known as: MEVACOR   magnesium oxide 400 MG tablet Commonly known as: MAG-OX   valACYclovir 1000 MG tablet Commonly known as: VALTREX   vitamin E 180 MG (400 UNITS) capsule     TAKE these medications   ceFEPIme 2 g in sodium chloride 0.9 % 100 mL Inject 2 g  into the vein every 12 (twelve) hours.   heparin 25000-0.45 UT/250ML-% infusion Inject 1,050 Units/hr into the vein continuous.   hydrALAZINE 25 MG tablet Commonly known as: APRESOLINE Take 1 tablet (25 mg total) by mouth 3 (three) times daily as needed (for SBP>165).   metoprolol tartrate 25 MG tablet Commonly known as: LOPRESSOR Take 0.5 tablets (12.5 mg total) by mouth 2 (two) times daily.   nitroGLYCERIN 0.4 MG SL tablet Commonly known as: NITROSTAT Place 1 tablet (0.4 mg total) under the tongue every 5 (five) minutes as needed for chest pain.   pravastatin 20 MG tablet Commonly known as: PRAVACHOL Take 1 tablet (20 mg total) by mouth daily at 6 PM.            Home Infusion Instuctions  (From admission, onward)         Start     Ordered   01/08/20 0000  Home infusion instructions    Question:  Instructions  Answer:  Flushing of vascular access device: 0.9% NaCl pre/post medication administration and prn patency; Heparin 100 u/ml, 61ml for implanted ports and Heparin 10u/ml, 79ml for all other central venous catheters.   01/08/20 1122           Signed: Thornell Mule 01/08/2020, 11:23 AM

## 2020-01-08 NOTE — Progress Notes (Signed)
Pre Procedure note for inpatients:   [name] has been scheduled for Procedure(s): CORONARY ARTERY BYPASS GRAFTING (CABG) (N/A) TRANSESOPHAGEAL ECHOCARDIOGRAM (TEE) (N/A) today. The various methods of treatment have been discussed with the patient. After consideration of the risks, benefits and treatment options the patient has consented to the planned procedure.   The patient has been seen and labs reviewed. There are no changes in the patient's condition to prevent proceeding with the planned procedure today.  Recent labs:  Lab Results  Component Value Date   WBC 11.1 (H) 01/08/2020   HGB 11.6 (L) 01/08/2020   HCT 34.8 (L) 01/08/2020   PLT 171 01/08/2020   GLUCOSE 102 (H) 01/08/2020   CHOL 152 01/08/2020   TRIG 64 01/08/2020   HDL 34 (L) 01/08/2020   LDLDIRECT 188.8 04/05/2012   LDLCALC 105 (H) 01/08/2020   ALT 19 05/25/2016   AST 22 05/25/2016   NA 136 01/08/2020   K 4.1 01/08/2020   CL 105 01/08/2020   CREATININE 0.99 01/08/2020   BUN 11 01/08/2020   CO2 21 (L) 01/08/2020   TSH 2.687 01/08/2020   PSA 1.26 07/11/2010   INR 0.9 01/07/2020   HGBA1C 5.4 01/08/2020    Ivin Poot III, MD 01/08/2020 1:00 PM

## 2020-01-08 NOTE — Anesthesia Preprocedure Evaluation (Signed)
Anesthesia Evaluation  Patient identified by MRN, date of birth, ID band Patient awake    Reviewed: Allergy & Precautions, NPO status , Patient's Chart, lab work & pertinent test results  Airway Mallampati: I  TM Distance: >3 FB Neck ROM: Full    Dental   Pulmonary    Pulmonary exam normal        Cardiovascular hypertension, Pt. on medications + Past MI  Normal cardiovascular exam     Neuro/Psych    GI/Hepatic   Endo/Other    Renal/GU      Musculoskeletal   Abdominal   Peds  Hematology   Anesthesia Other Findings   Reproductive/Obstetrics                             Anesthesia Physical Anesthesia Plan  ASA: III  Anesthesia Plan: General   Post-op Pain Management:    Induction: Intravenous  PONV Risk Score and Plan: 2 and Ondansetron and Treatment Saini vary due to age or medical condition  Airway Management Planned: Oral ETT  Additional Equipment: Arterial line, CVP, PA Cath, TEE and Ultrasound Guidance Line Placement  Intra-op Plan:   Post-operative Plan: Post-operative intubation/ventilation  Informed Consent: I have reviewed the patients History and Physical, chart, labs and discussed the procedure including the risks, benefits and alternatives for the proposed anesthesia with the patient or authorized representative who has indicated his/her understanding and acceptance.       Plan Discussed with: CRNA and Surgeon  Anesthesia Plan Comments:         Anesthesia Quick Evaluation

## 2020-01-08 NOTE — Progress Notes (Signed)
Pre-CABG Dopplers completed. Refer to "CV Proc" under chart review to view preliminary results.  01/08/2020 12:52 PM Kelby Aline., MHA, RVT, RDCS, RDMS

## 2020-01-08 NOTE — Telephone Encounter (Signed)
Yes--he had an MI and is currently in the cath lab I will call him to check on him when he is available

## 2020-01-08 NOTE — Consult Note (Signed)
ANTICOAGULATION CONSULT NOTE - Initial Consult  Pharmacy Consult for Heparin infusion Indication: chest pain/ACS  Allergies  Allergen Reactions  . Crestor [Rosuvastatin] Other (See Comments)    Severe myalgias  . Penicillins Rash  . Atorvastatin Other (See Comments)    ache  . Simvastatin Other (See Comments)    aching    Patient Measurements: Height: 5\' 7"  (170.2 cm) Weight: 140 lb 4.8 oz (63.6 kg) IBW/kg (Calculated) : 66.1 Heparin Dosing Weight: 61.2 kg  Vital Signs: Temp: 98.2 F (36.8 C) (01/18 0532) Temp Source: Oral (01/18 0532) BP: 103/56 (01/18 0532) Pulse Rate: 52 (01/18 0532)  Labs: Recent Labs    01/07/20 1030 01/07/20 1030 01/07/20 1248 01/07/20 1251 01/07/20 1425 01/07/20 1634 01/07/20 1918 01/07/20 2045 01/07/20 2255 01/08/20 0612 01/08/20 0613  HGB 14.0  --   --   --   --   --   --   --   --   --  11.6*  HCT 40.8  --   --   --   --   --   --   --   --   --  34.8*  PLT 233  --   --   --   --   --   --   --   --   --  171  APTT  --   --   --  31  --   --   --   --   --   --   --   LABPROT  --   --   --  12.4  --   --   --   --   --   --   --   INR  --   --   --  0.9  --   --   --   --   --   --   --   HEPARINUNFRC  --   --   --   --   --   --   --  <0.10*  --  0.23*  --   CREATININE 0.91   < >  --   --  0.92  --   --   --  0.81 0.99  --   TROPONINIHS 5,807*   < > 5,226*  --   --  5,679* 5,775*  --   --   --   --    < > = values in this interval not displayed.    Estimated Creatinine Clearance: 55.3 mL/min (by C-G formula based on SCr of 0.99 mg/dL).   Medical History: Past Medical History:  Diagnosis Date  . Arthritis   . Bone spur 2010   right shoulder  . Hyperlipidemia   . Hypertension   . IBS (irritable bowel syndrome)     Medications:  No anticoagulation prior to admission  Assessment: Patient is a 79 y/o M who presented to [facility] ED 1/17 with chief complaint of midsternal chest pain and shortness of breath. Troponin elevated to  5807. EKG pending interpretation. CTA negative for PE. Pharmacy has been consulted to start heparin infusion for acute coronary syndrome.   Baseline H&H and platelets within normal limits. Baseline aPTT 31, INR 0.9.   Goal of Therapy:  Heparin level 0.3-0.7 units/ml Monitor platelets by anticoagulation protocol: Yes   Plan:  01/18 @ 0500 HL 0.23 subtherapeutic. Will increase rate to 1050 units/hr and will recheck HL @ 1300, CBC trending down will continue to monitor.  Shanon Brow  Estil Daft, PharmD, BCPS Clinical Pharmacist 01/08/2020,7:22 AM

## 2020-01-09 ENCOUNTER — Inpatient Hospital Stay (HOSPITAL_COMMUNITY): Payer: Medicare PPO

## 2020-01-09 ENCOUNTER — Encounter (HOSPITAL_COMMUNITY): Payer: Self-pay | Admitting: Cardiothoracic Surgery

## 2020-01-09 LAB — BASIC METABOLIC PANEL
Anion gap: 10 (ref 5–15)
Anion gap: 10 (ref 5–15)
Anion gap: 7 (ref 5–15)
BUN: 13 mg/dL (ref 8–23)
BUN: 8 mg/dL (ref 8–23)
BUN: 9 mg/dL (ref 8–23)
CO2: 19 mmol/L — ABNORMAL LOW (ref 22–32)
CO2: 20 mmol/L — ABNORMAL LOW (ref 22–32)
CO2: 23 mmol/L (ref 22–32)
Calcium: 6.9 mg/dL — ABNORMAL LOW (ref 8.9–10.3)
Calcium: 7.7 mg/dL — ABNORMAL LOW (ref 8.9–10.3)
Calcium: 7.8 mg/dL — ABNORMAL LOW (ref 8.9–10.3)
Chloride: 107 mmol/L (ref 98–111)
Chloride: 110 mmol/L (ref 98–111)
Chloride: 97 mmol/L — ABNORMAL LOW (ref 98–111)
Creatinine, Ser: 0.79 mg/dL (ref 0.61–1.24)
Creatinine, Ser: 0.79 mg/dL (ref 0.61–1.24)
Creatinine, Ser: 1.13 mg/dL (ref 0.61–1.24)
GFR calc Af Amer: >60 mL/min (ref >60)
GFR calc Af Amer: >60 mL/min (ref >60)
GFR calc Af Amer: >60 mL/min (ref >60)
GFR calc non Af Amer: >60 mL/min (ref >60)
GFR calc non Af Amer: >60 mL/min (ref >60)
GFR calc non Af Amer: >60 mL/min (ref >60)
Glucose, Bld: 143 mg/dL — ABNORMAL HIGH (ref 70–99)
Glucose, Bld: 155 mg/dL — ABNORMAL HIGH (ref 70–99)
Glucose, Bld: 182 mg/dL — ABNORMAL HIGH (ref 70–99)
Potassium: 4 mmol/L (ref 3.5–5.1)
Potassium: 4.1 mmol/L (ref 3.5–5.1)
Potassium: 4.2 mmol/L (ref 3.5–5.1)
Sodium: 130 mmol/L — ABNORMAL LOW (ref 135–145)
Sodium: 136 mmol/L (ref 135–145)
Sodium: 137 mmol/L (ref 135–145)

## 2020-01-09 LAB — COOXEMETRY PANEL
Carboxyhemoglobin: 1 % (ref 0.5–1.5)
Carboxyhemoglobin: 1.2 % (ref 0.5–1.5)
Carboxyhemoglobin: 1.4 % (ref 0.5–1.5)
Methemoglobin: 0.6 % (ref 0.0–1.5)
Methemoglobin: 0.6 % (ref 0.0–1.5)
Methemoglobin: 1.1 % (ref 0.0–1.5)
O2 Saturation: 58.2 %
O2 Saturation: 61.7 %
O2 Saturation: 54.2 %
Total hemoglobin: 8.6 g/dL — ABNORMAL LOW (ref 12.0–16.0)
Total hemoglobin: 9.2 g/dL — ABNORMAL LOW (ref 12.0–16.0)
Total hemoglobin: 8.8 g/dL — ABNORMAL LOW (ref 12.0–16.0)

## 2020-01-09 LAB — CBC
HCT: 25.4 % — ABNORMAL LOW (ref 39.0–52.0)
HCT: 26.2 % — ABNORMAL LOW (ref 39.0–52.0)
HCT: 27.7 % — ABNORMAL LOW (ref 39.0–52.0)
Hemoglobin: 8.5 g/dL — ABNORMAL LOW (ref 13.0–17.0)
Hemoglobin: 8.6 g/dL — ABNORMAL LOW (ref 13.0–17.0)
Hemoglobin: 9.2 g/dL — ABNORMAL LOW (ref 13.0–17.0)
MCH: 30.1 pg (ref 26.0–34.0)
MCH: 30.6 pg (ref 26.0–34.0)
MCH: 30.6 pg (ref 26.0–34.0)
MCHC: 32.8 g/dL (ref 30.0–36.0)
MCHC: 33.2 g/dL (ref 30.0–36.0)
MCHC: 33.5 g/dL (ref 30.0–36.0)
MCV: 91.4 fL (ref 80.0–100.0)
MCV: 91.6 fL (ref 80.0–100.0)
MCV: 92 fL (ref 80.0–100.0)
Platelets: 108 10*3/uL — ABNORMAL LOW (ref 150–400)
Platelets: 108 10*3/uL — ABNORMAL LOW (ref 150–400)
Platelets: 167 10*3/uL (ref 150–400)
RBC: 2.78 MIL/uL — ABNORMAL LOW (ref 4.22–5.81)
RBC: 2.86 MIL/uL — ABNORMAL LOW (ref 4.22–5.81)
RBC: 3.01 MIL/uL — ABNORMAL LOW (ref 4.22–5.81)
RDW: 12.8 % (ref 11.5–15.5)
RDW: 12.8 % (ref 11.5–15.5)
RDW: 13.2 % (ref 11.5–15.5)
WBC: 10.1 10*3/uL (ref 4.0–10.5)
WBC: 11.8 10*3/uL — ABNORMAL HIGH (ref 4.0–10.5)
WBC: 13.5 10*3/uL — ABNORMAL HIGH (ref 4.0–10.5)
nRBC: 0 % (ref 0.0–0.2)
nRBC: 0 % (ref 0.0–0.2)
nRBC: 0 % (ref 0.0–0.2)

## 2020-01-09 LAB — POCT I-STAT 7, (LYTES, BLD GAS, ICA,H+H)
Acid-base deficit: 5 mmol/L — ABNORMAL HIGH (ref 0.0–2.0)
Acid-base deficit: 6 mmol/L — ABNORMAL HIGH (ref 0.0–2.0)
Acid-base deficit: 1 mmol/L (ref 0.0–2.0)
Bicarbonate: 18.7 mmol/L — ABNORMAL LOW (ref 20.0–28.0)
Bicarbonate: 20.8 mmol/L (ref 20.0–28.0)
Bicarbonate: 21.8 mmol/L (ref 20.0–28.0)
Calcium, Ion: 0.89 mmol/L — CL (ref 1.15–1.40)
Calcium, Ion: 1.02 mmol/L — ABNORMAL LOW (ref 1.15–1.40)
Calcium, Ion: 1.04 mmol/L — ABNORMAL LOW (ref 1.15–1.40)
HCT: 26 % — ABNORMAL LOW (ref 39.0–52.0)
HCT: 27 % — ABNORMAL LOW (ref 39.0–52.0)
HCT: 27 % — ABNORMAL LOW (ref 39.0–52.0)
Hemoglobin: 8.8 g/dL — ABNORMAL LOW (ref 13.0–17.0)
Hemoglobin: 9.2 g/dL — ABNORMAL LOW (ref 13.0–17.0)
Hemoglobin: 9.2 g/dL — ABNORMAL LOW (ref 13.0–17.0)
O2 Saturation: 97 %
O2 Saturation: 99 %
O2 Saturation: 96 %
Patient temperature: 36.5
Patient temperature: 36.9
Patient temperature: 99.9
Potassium: 4.2 mmol/L (ref 3.5–5.1)
Potassium: 4.4 mmol/L (ref 3.5–5.1)
Potassium: 3.8 mmol/L (ref 3.5–5.1)
Sodium: 139 mmol/L (ref 135–145)
Sodium: 145 mmol/L (ref 135–145)
Sodium: 133 mmol/L — ABNORMAL LOW (ref 135–145)
TCO2: 20 mmol/L — ABNORMAL LOW (ref 22–32)
TCO2: 22 mmol/L (ref 22–32)
TCO2: 23 mmol/L (ref 22–32)
pCO2 arterial: 32.8 mmHg (ref 32.0–48.0)
pCO2 arterial: 37.5 mmHg (ref 32.0–48.0)
pCO2 arterial: 31.3 mmHg — ABNORMAL LOW (ref 32.0–48.0)
pH, Arterial: 7.35 (ref 7.350–7.450)
pH, Arterial: 7.363 (ref 7.350–7.450)
pH, Arterial: 7.455 — ABNORMAL HIGH (ref 7.350–7.450)
pO2, Arterial: 130 mmHg — ABNORMAL HIGH (ref 83.0–108.0)
pO2, Arterial: 96 mmHg (ref 83.0–108.0)
pO2, Arterial: 78 mmHg — ABNORMAL LOW (ref 83.0–108.0)

## 2020-01-09 LAB — GLUCOSE, CAPILLARY
Glucose-Capillary: 130 mg/dL — ABNORMAL HIGH (ref 70–99)
Glucose-Capillary: 130 mg/dL — ABNORMAL HIGH (ref 70–99)
Glucose-Capillary: 137 mg/dL — ABNORMAL HIGH (ref 70–99)
Glucose-Capillary: 142 mg/dL — ABNORMAL HIGH (ref 70–99)
Glucose-Capillary: 151 mg/dL — ABNORMAL HIGH (ref 70–99)
Glucose-Capillary: 152 mg/dL — ABNORMAL HIGH (ref 70–99)
Glucose-Capillary: 165 mg/dL — ABNORMAL HIGH (ref 70–99)

## 2020-01-09 LAB — MAGNESIUM
Magnesium: 2.3 mg/dL (ref 1.7–2.4)
Magnesium: 2.5 mg/dL — ABNORMAL HIGH (ref 1.7–2.4)
Magnesium: 3.1 mg/dL — ABNORMAL HIGH (ref 1.7–2.4)

## 2020-01-09 MED ORDER — MORPHINE SULFATE (PF) 2 MG/ML IV SOLN: 1.0000 mg | INTRAVENOUS | Status: DC | PRN

## 2020-01-09 MED ORDER — MIDAZOLAM HCL 2 MG/2ML IJ SOLN: 2.0000 mg | INTRAMUSCULAR | Status: DC | PRN

## 2020-01-09 MED ORDER — FUROSEMIDE 10 MG/ML IJ SOLN
20.0000 mg | Freq: Two times a day (BID) | INTRAMUSCULAR | Status: DC
  Filled 2020-01-09: qty 2

## 2020-01-09 MED ORDER — ORAL CARE MOUTH RINSE
15.0000 mL | Freq: Two times a day (BID) | OROMUCOSAL | Status: DC
  Administered 2020-01-09 – 2020-01-13 (×10): 15 mL via OROMUCOSAL

## 2020-01-09 MED ORDER — SODIUM BICARBONATE 8.4 % IV SOLN
50.0000 meq | Freq: Once | INTRAVENOUS | Status: DC
  Filled 2020-01-09: qty 50

## 2020-01-09 MED ORDER — ADULT MULTIVITAMIN W/MINERALS CH
1.0000 | ORAL_TABLET | Freq: Every day | ORAL | Status: DC
  Administered 2020-01-09 – 2020-01-14 (×5): 1 via ORAL
  Filled 2020-01-09 (×6): qty 1

## 2020-01-09 MED ORDER — SODIUM BICARBONATE 8.4 % IV SOLN
50.0000 meq | Freq: Once | INTRAVENOUS | Status: AC
  Administered 2020-01-09: 50 meq via INTRAVENOUS

## 2020-01-09 MED ORDER — METOCLOPRAMIDE HCL 5 MG/ML IJ SOLN
10.0000 mg | Freq: Four times a day (QID) | INTRAMUSCULAR | Status: AC
  Administered 2020-01-09 – 2020-01-10 (×4): 10 mg via INTRAVENOUS
  Filled 2020-01-09 (×3): qty 2

## 2020-01-09 MED ORDER — SIMETHICONE 80 MG PO CHEW
80.0000 mg | CHEWABLE_TABLET | Freq: Three times a day (TID) | ORAL | Status: DC
  Administered 2020-01-09 – 2020-01-11 (×8): 80 mg via ORAL
  Filled 2020-01-09 (×13): qty 1

## 2020-01-09 MED ORDER — METOCLOPRAMIDE HCL 5 MG/ML IJ SOLN
10.0000 mg | Freq: Four times a day (QID) | INTRAMUSCULAR | Status: DC
  Filled 2020-01-09: qty 2

## 2020-01-09 MED ORDER — FUROSEMIDE 10 MG/ML IJ SOLN
20.0000 mg | Freq: Two times a day (BID) | INTRAMUSCULAR | Status: DC
  Administered 2020-01-09 – 2020-01-10 (×3): 20 mg via INTRAVENOUS
  Filled 2020-01-09 (×2): qty 2

## 2020-01-09 MED ORDER — ENSURE ENLIVE PO LIQD
237.0000 mL | Freq: Two times a day (BID) | ORAL | Status: DC
  Administered 2020-01-09 – 2020-01-11 (×4): 237 mL via ORAL

## 2020-01-09 NOTE — Plan of Care (Signed)

## 2020-01-09 NOTE — Procedures (Signed)
Extubation Procedure Note  Patient Details:   Name: [name] Want DOB: [birth year] MRN: [id number]   Airway Documentation:    Vent end date: 01/09/20 Vent end time: 0012   Evaluation  O2 sats: stable throughout Complications: No apparent complications Patient did tolerate procedure well. Bilateral Breath Sounds: Clear   Yes  NIF AND VC WITHIN NORMAL LIMITS. NO STRIDOR.  Levora Dredge 01/09/2020, 12:13 AM

## 2020-01-09 NOTE — Anesthesia Postprocedure Evaluation (Signed)
Anesthesia Post Note  Patient: [name]  Procedure(s) Performed: CORONARY ARTERY BYPASS GRAFTING (CABG) x 4  WITH ENDOSCOPIC HARVESTING OF BILATERAL GREATER SAPHENOUS VEINS (N/A Chest) TRANSESOPHAGEAL ECHOCARDIOGRAM (TEE) (N/A )     Patient location during evaluation: SICU Anesthesia Type: General Level of consciousness: sedated and patient remains intubated per anesthesia plan Pain management: pain level controlled Vital Signs Assessment: post-procedure vital signs reviewed and stable Respiratory status: patient remains intubated per anesthesia plan and patient on ventilator - see flowsheet for VS Cardiovascular status: stable Postop Assessment: no apparent nausea or vomiting Anesthetic complications: no    Last Vitals:  Vitals:   01/09/20 0056 01/09/20 0100  BP:  (!) 115/59  Pulse: 82 84  Resp: 13 15  Temp: 37.1 C 37.6 C  SpO2: 97% 98%    Last Pain:  Vitals:   01/09/20 0000  TempSrc: Core                 [name]

## 2020-01-09 NOTE — H&P (Signed)
Medical Record [id number] Date of Birth: December 17, 1941   Referring: Dr. Midge Minium  primary Care: Venia Carbon, MD Primary Cardiologist:No primary care provider on file.   Chief Complaint:   No chief complaint on file. Chest pain shortness of breath, non-STEMI   History of Present Illness:     Patient examined, images of coronary angiograms and CTA images personally reviewed and discussed with Dr. Fletcher Anon for coordination of care.  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  He was admitted yesterday through the ED with positive cardiac enzymes and placed on heparin.  He underwent cardiac catheterization today which shows a 99% left main stenosis and 99% ostial RCA stenosis.  EF is 45% with LVEDP of 22 mmHg.  Patient was transferred to this hospital for urgent-emergent coronary artery bypass surgery.   I agree with the recommendation for coronary bypass surgery and discussed the procedure in detail with the patient including the expected benefits and risks.  The patient appears to have adequate but small targets for grafting and adequate LV function with EF 45%.   Patient had preoperative Doppler ultrasound exam which shows no significant carotid disease.  His preoperative chest x-ray shows mild interstitial edema.       Current Activity/ Functional Status: Patient is highly functional at home lives with significant other   Zubrod Score: At the time of surgery this patient's most appropriate activity status/level should be described as: [] ?    0    Normal activity, no symptoms [] ?    1    Restricted in physical strenuous activity but ambulatory, able to do out light work [x] ?    2    Ambulatory and capable of self care, unable to do work activities, up and about                 more than 50%  Of the time                            [] ?    3     Only limited self care, in bed greater than 50% of waking hours [] ?    4    Completely disabled, no self care, confined to bed or chair [] ?    5    Moribund       Past Medical History:  Diagnosis Date  . Arthritis    . Bone spur 2010    right shoulder  . Hyperlipidemia    . Hypertension    . IBS (irritable bowel syndrome)             Past Surgical History:  Procedure Laterality Date  . BACK SURGERY        Lumbar  . BUNIONECTOMY      . HEMORRHOID SURGERY   2004  . INGUINAL HERNIA REPAIR   02/11  . ROTATOR CUFF REPAIR   07/11    left/ bone spur Dr.Blackman  . SHOULDER ARTHROSCOPY WITH OPEN ROTATOR CUFF REPAIR Right 05/26/2018    Procedure: SHOULDER ARTHROSCOPY WITH OPEN ROTATOR CUFF REPAIR;  Surgeon: Corky Mull, MD;  Location: [facility] ORS;  Service: Orthopedics;  Laterality: Right;  debridement, decompression  . 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 Alcohol/week: 1.0 standard drinks  . Types: 1 Glasses of wine per week             Allergies  Allergen Reactions  . Crestor [Rosuvastatin] Other (See Comments)      Severe myalgias  . Penicillins Rash  . Atorvastatin Other (See Comments)      ache  . Simvastatin Other (See Comments)      aching               Current Facility-Administered Medications  Medication Dose Route Frequency Provider Last Rate Last Admin  . bisacodyl (DULCOLAX) EC tablet 5 mg  5 mg Oral Once Prescott Gum, Collier Salina, MD      . chlorhexidine (HIBICLENS) 4 % liquid 1 application  1 application Topical Once Prescott Gum, Collier Salina, MD      . Derrill Memo ON 01/09/2020] chlorhexidine (PERIDEX) 0.12 % solution 15 mL  15 mL Mouth/Throat Once Prescott Gum, Collier Salina, MD      . dexmedetomidine (PRECEDEX) 400 MCG/100ML (4 mcg/mL) infusion  0.1-0.7 mcg/kg/hr Intravenous To OR Prescott Gum, Collier Salina,  MD      . EPINEPHrine (ADRENALIN) 4 mg in dextrose 5 % 250 mL (0.016 mg/mL) infusion  0-10 mcg/min Intravenous To OR Prescott Gum, Collier Salina, MD      . heparin 2,500 Units, papaverine 30 mg in electrolyte-148 (PLASMALYTE-148) 500 mL irrigation   Irrigation To OR Prescott Gum, Collier Salina, MD      . heparin 30,000 units/NS 1000 mL solution for CELLSAVER   Other To OR Prescott Gum, Collier Salina, MD      . insulin regular, human (MYXREDLIN) 100 units/ 100 mL infusion   Intravenous To OR Prescott Gum, Collier Salina, MD      . levofloxacin (LEVAQUIN) IVPB 500 mg  500 mg Intravenous To OR Prescott Gum, Collier Salina, MD      . magnesium sulfate (IV Push/IM) injection 40 mEq  40 mEq Other To OR Prescott Gum, Collier Salina, MD      . milrinone (PRIMACOR) 20 MG/100 ML (0.2 mg/mL) infusion  0.3 mcg/kg/min Intravenous To OR Prescott Gum, Collier Salina, MD      . nitroGLYCERIN 50 mg in dextrose 5 % 250 mL (0.2 mg/mL) infusion  2-200 mcg/min Intravenous To OR Prescott Gum, Collier Salina, MD      . norepinephrine (LEVOPHED) 4mg  in 239mL premix infusion  0-40 mcg/min Intravenous To OR Prescott Gum, Collier Salina, MD      . phenylephrine (NEOSYNEPHRINE) 20-0.9 MG/250ML-% infusion  30-200 mcg/min Intravenous To OR Prescott Gum, Collier Salina, MD      . potassium chloride injection 80 mEq  80 mEq Other To OR Prescott Gum, Collier Salina, MD      . temazepam (RESTORIL) capsule 15 mg  15 mg Oral Once PRN Prescott Gum, Collier Salina, MD      . tranexamic acid (CYKLOKAPRON) 2,500 mg in sodium chloride 0.9 % 250 mL (10 mg/mL) infusion  1.5 mg/kg/hr Intravenous To OR Prescott Gum, Collier Salina, MD      . tranexamic acid (CYKLOKAPRON) bolus via infusion - over 30 minutes 954 mg  15 mg/kg Intravenous To OR Prescott Gum, Collier Salina, MD      . tranexamic acid (CYKLOKAPRON) pump prime solution 127 mg  2 mg/kg Intracatheter To OR Prescott Gum, Collier Salina, MD      . vancomycin (VANCOREADY) IVPB 1250 mg/250 mL  1,250 mg  Intravenous To OR Ivin Poot, MD        Facility-Administered Medications Ordered in Other Encounters  Medication Dose Route Frequency Provider Last Rate  Last Admin  . lactated ringers infusion   Intravenous Continuous PRN Oletta Lamas, CRNA   New Bag at 01/08/20 1231  . lactated ringers infusion   Intravenous Continuous PRN Oletta Lamas, CRNA   New Bag at 01/08/20 1231  . lactated ringers infusion   Intravenous Continuous PRN Oletta Lamas, CRNA   New Bag at 01/08/20 1232             Medications Prior to Admission  Medication Sig Dispense Refill Last Dose  . ceFEPIme 2 g in sodium chloride 0.9 % 100 mL Inject 2 g into the vein every 12 (twelve) hours. 3 each 0    . heparin 25000-0.45 UT/250ML-% infusion Inject 1,050 Units/hr into the vein continuous.        . hydrALAZINE (APRESOLINE) 25 MG tablet Take 1 tablet (25 mg total) by mouth 3 (three) times daily as needed (for SBP>165). 30 tablet 0    . metoprolol tartrate (LOPRESSOR) 25 MG tablet Take 0.5 tablets (12.5 mg total) by mouth 2 (two) times daily. 30 tablet 0    . nitroGLYCERIN (NITROSTAT) 0.4 MG SL tablet Place 1 tablet (0.4 mg total) under the tongue every 5 (five) minutes as needed for chest pain.   12    . pravastatin (PRAVACHOL) 20 MG tablet Take 1 tablet (20 mg total) by mouth daily at 6 PM.                 Family History  Problem Relation Age of Onset  . Cancer Neg Hx    . Diabetes Neg Hx          Review of Systems:    ROS Right-hand-dominant No previous thoracic trauma No history of bleeding disorder or blood transfusions No history of DVT claudication or varicose veins               Cardiac Review of Systems: Y or  [    ]= no             Chest Pain [ y   ]         Resting SOB [   ]         Exertional SOB  [name]  ]   Orthopnea [  ]             Pedal Edema [   ]        Palpitations [  ]            Syncope  [  ]    Presyncope [  y ]             General Review of Systems: [Y] = yes [  ]=no Constitional: recent weight change [  ]; anorexia [  ]; fatigue [name]  ]; nausea [  ]; night sweats [  ]; fever [  ]; or chills [  ]                                                                Dental: Last Dentist visit:  Eye : blurred vision [  ]; diplopia [   ]; vision changes [  ];  Amaurosis fugax[  ]; Resp: cough [  ];  wheezing[  ];  hemoptysis[  ]; shortness of breath[  ]; paroxysmal nocturnal dyspnea[  ]; dyspnea on exertion[  ]; or orthopnea[  ];  GI:  gallstones[  ], vomiting[  ];  dysphagia[  ]; melena[  ];  hematochezia [  ]; heartburn[  ];   Hx of  Colonoscopy[  ]; GU: kidney stones [  ]; hematuria[  ];   dysuria [  ];  nocturia[  ];  history of     obstruction [  ]; urinary frequency [  ]             Skin: rash, swelling[  ];, hair loss[  ];  peripheral edema[  ];  or itching[  ]; Musculosketetal: myalgias[  ];  joint swelling[  ];  joint erythema[  ];  joint pain[  ];  back pain[  ];             Heme/Lymph: bruising[  ];  bleeding[  ];  anemia[  ];  Neuro: TIA[  ];  headaches[  ];  stroke[  ];  vertigo[  ];  seizures[  ];   paresthesias[  ];  difficulty walking[  ];             Psych:depression[  ]; anxiety[  ];             Endocrine: diabetes[  ];  thyroid dysfunction[  ];                           Physical Exam: BP (!) 138/58   Pulse 69   Resp 13   SpO2 92%        Physical Exam   General: 79 year old small thin male alert and responsive no acute distress HEENT: Normocephalic pupils equal , dentition adequate Neck: Supple without JVD, adenopathy, or bruit Chest: Clear to auscultation, symmetrical breath sounds, no rhonchi, no tenderness             or deformity Cardiovascular: Regular rate and rhythm, no murmur, no gallop, peripheral pulses             palpable in all extremities Abdomen:  Soft, nontender, no palpable mass or organomegaly Extremities: Warm, well-perfused, no clubbing cyanosis edema or tenderness,              no venous stasis changes of the legs Rectal/GU: Deferred Neuro: Grossly non--focal and symmetrical throughout Skin: Clean and dry without rash or ulceration       Diagnostic Studies & Laboratory  data:     Recent Radiology Findings:    Imaging Results (Last 48 hours)  CT Angio Chest PE W and/or Wo Contrast   Result Date: 01/07/2020 CLINICAL DATA:  Short of breath. EXAM: CT ANGIOGRAPHY CHEST WITH CONTRAST TECHNIQUE: Multidetector CT imaging of the chest was performed using the standard protocol during bolus administration of intravenous contrast. Multiplanar CT image reconstructions and MIPs were obtained to evaluate the vascular anatomy. CONTRAST:  23mL OMNIPAQUE IOHEXOL 350 MG/ML SOLN COMPARISON:  None. FINDINGS: Cardiovascular: No filling defects within the pulmonary arteries arteries to suggest acute pulmonary embolism. Coronary artery calcification and aortic atherosclerotic calcification. Mediastinum/Nodes: No axillary supraclavicular adenopathy. No discrete enlarged mediastinal lymph nodes. Lungs/Pleura: Large bilateral layering pleural effusions. Mild ground-glass perihilar opacities in the LEFT and RIGHT upper  lobe. Mild basilar atelectasis related to the large effusions. Pulmonary infarction. Upper Abdomen: Limited view of the liver, kidneys, pancreas are unremarkable. Normal adrenal glands. Musculoskeletal: No aggressive osseous lesion Review of the MIP images confirms the above findings. IMPRESSION: 1. No acute pulmonary embolism. 2. Large bilateral layering pleural effusions. 3. Ground-glass opacities in the upper lobes likely represent mild edema. 4. Coronary artery calcification and Aortic Atherosclerosis (ICD10-I70.0). Electronically Signed   By: Suzy Bouchard M.D.   On: 01/07/2020 11:38    CARDIAC CATHETERIZATION   Result Date: 01/08/2020  Ost LM to Mid LM lesion is 99% stenosed.  1st Mrg lesion is 80% stenosed.  Mid LAD lesion is 40% stenosed.  LV end diastolic pressure is moderately elevated.  The left ventricular ejection fraction is 45-50% by visual estimate.  There is mild left ventricular systolic dysfunction.  Prox RCA lesion is 99% stenosed.  Ost RCA to Prox RCA  lesion is 70% stenosed.  Dist RCA lesion is 30% stenosed.  1.  Critical left main and RCA disease. 2.  Mildly reduced LV systolic function with an EF of 45%. 3.  Moderately elevated left ventricular end-diastolic pressure. Recommendations: Recommend emergent CABG.  I discussed with Dr. Prescott Gum and the patient will be transferred to [facility].    DG Chest Portable 1 View   Result Date: 01/07/2020 CLINICAL DATA:  Shortness of breath, hemoptysis EXAM: PORTABLE CHEST 1 VIEW COMPARISON:  None. FINDINGS: The heart size and mediastinal contours are within normal limits. Prominent interstitial markings throughout both lungs. Small right pleural effusion. No pneumothorax. Degenerative changes of the thoracic spine. IMPRESSION: 1. Prominent interstitial markings throughout both lungs. Findings Sparr reflect chronic bronchitic type lung changes and/or an underlying chronic interstitial lung disease. 2. Small right pleural effusion. Electronically Signed   By: Davina Poke D.O.   On: 01/07/2020 10:50    VAS US DOPPLER PRE CABG   Result Date: 01/08/2020 PREOPERATIVE VASCULAR EVALUATION  Indications:      Pre-CABG. Risk Factors:     Hypertension, hyperlipidemia. Comparison Study: No prior study. Performing Technologist: Maudry Mayhew MHA, RDMS, RVT, RDCS  Examination Guidelines: A complete evaluation includes B-mode imaging, spectral Doppler, color Doppler, and power Doppler as needed of all accessible portions of each vessel. Bilateral testing is considered an integral part of a complete examination. Limited examinations for reoccurring indications Mcglynn be performed as noted.  Right Carotid Findings: +----------+--------+-------+--------+--------------------------------+--------+           PSV cm/sEDV    StenosisDescribe                        Comments                   cm/s                                                     +----------+--------+-------+--------+--------------------------------+--------+ CCA Prox  112     12                                                      +----------+--------+-------+--------+--------------------------------+--------+ CCA Distal116     13  smooth and heterogenous                  +----------+--------+-------+--------+--------------------------------+--------+ ICA Prox  59      7              smooth, heterogenous and                                                  calcific                                 +----------+--------+-------+--------+--------------------------------+--------+ ICA Distal96      16                                                      +----------+--------+-------+--------+--------------------------------+--------+ ECA       87      3                                                       +----------+--------+-------+--------+--------------------------------+--------+ Portions of this table do not appear on this page. +----------+--------+-------+----------------+------------+           PSV cm/sEDV cmsDescribe        Arm Pressure +----------+--------+-------+----------------+------------+ [id number]             Multiphasic, WNL             +----------+--------+-------+----------------+------------+ +---------+--------+--+--------+-+---------+ VertebralPSV cm/s56EDV cm/s7Antegrade +---------+--------+--+--------+-+---------+ Left Carotid Findings: +----------+-------+-------+--------+---------------------------------+--------+           PSV    EDV    StenosisDescribe                         Comments           cm/s   cm/s                                                     +----------+-------+-------+--------+---------------------------------+--------+ CCA Prox  92     13             smooth and heterogenous                    +----------+-------+-------+--------+---------------------------------+--------+ CCA Distal138    19             smooth and heterogenous                   +----------+-------+-------+--------+---------------------------------+--------+ ICA Prox  100    14             smooth and heterogenous                   +----------+-------+-------+--------+---------------------------------+--------+ ICA Distal71     19                                                       +----------+-------+-------+--------+---------------------------------+--------+  ECA       207    14             heterogenous, irregular and                                               calcific                                  +----------+-------+-------+--------+---------------------------------+--------+ +----------+--------+--------+----------------+------------+ SubclavianPSV cm/sEDV cm/sDescribe        Arm Pressure +----------+--------+--------+----------------+------------+           137             Multiphasic, WNL             +----------+--------+--------+----------------+------------+ +---------+--------+--+--------+--+---------+ VertebralPSV cm/s75EDV cm/s17Antegrade +---------+--------+--+--------+--+---------+  ABI Findings: +--------+------------------+-----+--------+--------+ Right   Rt Pressure (mmHg)IndexWaveformComment  +--------+------------------+-----+--------+--------+ Brachial                       biphasic         +--------+------------------+-----+--------+--------+ +--------+------------------+-----+--------+-------+ Left    Lt Pressure (mmHg)IndexWaveformComment +--------+------------------+-----+--------+-------+ Brachial                       biphasic        +--------+------------------+-----+--------+-------+  Right Doppler Findings: +-----------+--------+-----+--------+------------------------------------------+ Site       PressureIndexDoppler  Comments                                   +-----------+--------+-----+--------+------------------------------------------+ Brachial                biphasic                                           +-----------+--------+-----+--------+------------------------------------------+ Radial                  biphasic                                           +-----------+--------+-----+--------+------------------------------------------+ Ulnar                   biphasic                                           +-----------+--------+-----+--------+------------------------------------------+ Palmar Arch                     Unable to evaluate due to recent                                           catheterization                            +-----------+--------+-----+--------+------------------------------------------+  Left Doppler Findings: +--------+--------+-----+--------+--------+ Site    PressureIndexDoppler Comments +--------+--------+-----+--------+--------+ Brachial  biphasic         +--------+--------+-----+--------+--------+ Radial               biphasic         +--------+--------+-----+--------+--------+ Ulnar                biphasic         +--------+--------+-----+--------+--------+  Summary: Right Carotid: Velocities in the right ICA are consistent with a 1-39% stenosis. Left Carotid: Velocities in the left ICA are consistent with a 1-39% stenosis. Vertebrals:  Bilateral vertebral arteries demonstrate antegrade flow. Subclavians: Normal flow hemodynamics were seen in bilateral subclavian              arteries. Left Upper Extremity: Doppler waveform obliterate with left radial compression. Doppler waveforms remain within normal limits with left ulnar compression.    Preliminary        I have independently reviewed the above radiologic studies and discussed with the patient    Recent Lab Findings: Recent Labs       Lab Results    Component Value Date    WBC 11.1 (H) 01/08/2020    HGB 11.6 (L) 01/08/2020    HCT 34.8 (L) 01/08/2020    PLT 171 01/08/2020    GLUCOSE 102 (H) 01/08/2020    CHOL 152 01/08/2020    TRIG 64 01/08/2020    HDL 34 (L) 01/08/2020    LDLDIRECT 188.8 04/05/2012    LDLCALC 105 (H) 01/08/2020    ALT 19 05/25/2016    AST 22 05/25/2016    NA 136 01/08/2020    K 4.1 01/08/2020    CL 105 01/08/2020    CREATININE 0.99 01/08/2020    BUN 11 01/08/2020    CO2 21 (L) 01/08/2020    TSH 2.687 01/08/2020    INR 0.9 01/07/2020    HGBA1C 5.4 01/08/2020            Assessment / Plan:   Severe left main and three-vessel CAD, unstable angina Moderate LV dysfunction Hypertension Non-ST elevation MI   Has been referred from [facility] for urgent-emergent multivessel CABG.  This will be performed today as the operating room is being prepared.  Patient understands the benefits and risks of surgery.  He agrees.         @ME1 @ 01/08/2020 1:10 PM           Really well        Routing History

## 2020-01-09 NOTE — Progress Notes (Signed)
Initial Nutrition Assessment  DOCUMENTATION CODES:   Not applicable  INTERVENTION:    Ensure Enlive po BID, each supplement provides 350 kcal and 20 grams of protein  MVI daily   NUTRITION DIAGNOSIS:   Increased nutrient needs related to post-op healing as evidenced by estimated needs.  GOAL:   Patient will meet greater than or equal to 90% of their needs  MONITOR:   PO intake, Supplement acceptance, Labs, I & O's, Weight trends, Diet advancement, Skin  REASON FOR ASSESSMENT:   Malnutrition Screening Tool    ASSESSMENT:   Patient with PMH significant for HTN, HLD, and IBS. Presents this admission with NSTEMI.   1/18- s/p CABG x4  RD working remotely.  Unable to reach pt by phone. Diet advanced to full liquid this am. No meal completions documented. RD to provide supplementation to maximize kcal/protein to promote post-op healing. Will attempt to obtain nutrition/weight history if possible.   Records indicate pt has maintained his weight around 140 lb over the last two years.   I/O: +2,355 ml since admit UOP: 2,065 ml x 24 hrs  Chest tubes: 382 ml x 24 hrs   Drips: milrinone, neosynephrine Medications: dulcolax, colace, 20 mg lasix BID, SS novolog, reglan BID Labs: calcium ionized 0.89 (L) Mg 2.5 (H) CBG 97-155  Diet Order:   Diet Order            Diet full liquid Room service appropriate? Yes; Fluid consistency: Thin  Diet effective now              EDUCATION NEEDS:   Not appropriate for education at this time  Skin:  Skin Assessment: Skin Integrity Issues: Skin Integrity Issues:: Incisions Incisions: chest, bilateral legs  Last BM:  1/17  Height:   Ht Readings from Last 1 Encounters:  01/08/20 5\' 7"  (1.702 m)    Weight:   Wt Readings from Last 1 Encounters:  01/09/20 64.7 kg    Ideal Body Weight:  67.3 kg  BMI:  Body mass index is 22.34 kg/m.  Estimated Nutritional Needs:   Kcal:  1950-2150 kcal  Protein:  100-120  grams  Fluid:  >/= 1.9 L/day   Mariana Single RD, LDN Clinical Nutrition Pager # - [phone]

## 2020-01-09 NOTE — Progress Notes (Signed)
1 Day Post-Op Procedure(s) (LRB): CORONARY ARTERY BYPASS GRAFTING (CABG) x 4  WITH ENDOSCOPIC HARVESTING OF BILATERAL GREATER SAPHENOUS VEINS (N/A) TRANSESOPHAGEAL ECHOCARDIOGRAM (TEE) (N/A) Subjective: Emergent CABG x4 with non-STEMI and 99% left main Moderate LV dysfunction Patient extubated with adequate hemodynamics on low-dose milrinone Mild postop metabolic acidosis likely related to preoperative heart failure with significant bilateral pleural effusions drained at the time of surgery  Objective: Vital signs in last 24 hours: Temp:  [96.8 F (36 C)-102.9 F (39.4 C)] 100.2 F (37.9 C) (01/19 0700) Pulse Rate:  [62-88] 80 (01/19 0700) Cardiac Rhythm: Normal sinus rhythm (01/19 0400) Resp:  [0-36] 21 (01/19 0700) BP: (96-139)/(47-65) 113/55 (01/19 0700) SpO2:  [89 %-100 %] 98 % (01/19 0700) Arterial Line BP: (103-177)/(41-60) 145/47 (01/19 0700) FiO2 (%):  [40 %-50 %] 40 % (01/18 2345) Weight:  [63.6 kg-64.7 kg] 64.7 kg (01/19 0500)  Hemodynamic parameters for last 24 hours: PAP: (25-43)/(9-20) 32/16 CO:  [4.5 L/min-5.1 L/min] 5.1 L/min CI:  [2.9 L/min/m2] 2.9 L/min/m2  Intake/Output from previous day: 01/18 0701 - 01/19 0700 In: 4798.1 [P.O.:1000; I.V.:2410.4; Blood:300; IV Piggyback:1087.7] Out: KD:[id number]; Chest Tube:382] Intake/Output this shift: No intake/output data recorded.       Exam    General- alert and comfortable    Neck- no JVD, no cervical adenopathy palpable, no carotid bruit   Lungs- clear without rales, wheezes   Cor- regular rate and rhythm, no murmur , gallop   Abdomen- soft, non-tender   Extremities - warm, non-tender, minimal edema   Neuro- oriented, appropriate, no focal weakness   Lab Results: Recent Labs    01/08/20 2357 01/09/20 0003 01/09/20 0057 01/09/20 0415  WBC 11.8*  --   --  10.1  HGB 9.2*   < > 9.2* 8.5*  HCT 27.7*   < > 27.0* 25.4*  PLT 108*  --   --  108*   < > = values in this interval not displayed.   BMET:   Recent Labs    01/08/20 2357 01/09/20 0003 01/09/20 0057 01/09/20 0415  NA 136   < > 145 137  K 4.1   < > 4.4 4.2  CL 107  --   --  110  CO2 19*  --   --  20*  GLUCOSE 143*  --   --  155*  BUN 9  --   --  8  CREATININE 0.79  --   --  0.79  CALCIUM 6.9*  --   --  7.7*   < > = values in this interval not displayed.    PT/INR:  Recent Labs    01/08/20 1920  LABPROT 19.1*  INR 1.6*   ABG    Component Value Date/Time   PHART 7.363 01/09/2020 0057   HCO3 18.7 (L) 01/09/2020 0057   TCO2 20 (L) 01/09/2020 0057   ACIDBASEDEF 6.0 (H) 01/09/2020 0057   O2SAT 61.7 01/09/2020 0822   CBG (last 3)  Recent Labs    01/08/20 2108 01/09/20 0000 01/09/20 0420  GLUCAP 97 137* 152*    Assessment/Plan: S/P Procedure(s) (LRB): CORONARY ARTERY BYPASS GRAFTING (CABG) x 4  WITH ENDOSCOPIC HARVESTING OF BILATERAL GREATER SAPHENOUS VEINS (N/A) TRANSESOPHAGEAL ECHOCARDIOGRAM (TEE) (N/A) Coox 61%, continue milrinone DC Swan and mobilize out of bed to chair.  Possibly remove some drainage tubes later Start Lasix    LOS: 1 day    [name] 01/09/2020

## 2020-01-09 NOTE — Op Note (Signed)
NAME: [name] ACCOUNT [id number] DATE OF BIRTH:10/19/1941 FACILITY: [facility] LOCATION: [facility] PHYSICIAN:Maeli Spacek VAN TRIGT III, MD  OPERATIVE REPORT  DATE OF PROCEDURE:  01/08/2020  OPERATION: 1.  Emergency coronary artery bypass grafting x4 (left internal mammary artery to left anterior descending, saphenous vein graft to posterior  descending, saphenous vein graft sequentially to the obtuse marginal 1 and obtuse marginal 2 circumflex  branches). 2.  Endoscopic harvest of bilateral greater saphenous vein.  PREOPERATIVE DIAGNOSES:   1.  Critical left main stenosis.  2.  Non-ST elevation myocardial infarction.  3.  Severe 3-vessel coronary artery disease.  4.  Moderate left ventricular dysfunction.  POSTOPERATIVE DIAGNOSES:   1.  Critical left main stenosis.  2.  Non-ST elevation myocardial infarction.  3.  Severe 3-vessel coronary artery disease.  4.  Moderate left ventricular dysfunction.  SURGEON:  Ivin Poot, MD  ASSISTANT:  Enid Cutter, PA-C.  ANESTHESIA:  General by Dr. Lillia Abed.  DESCRIPTION OF PROCEDURE:  After informed consent was documented in preoperative holding and final questions were addressed with the patient, the patient was brought to the operating room and placed supine on the operating table.  General anesthesia was  induced under invasive hemodynamic monitoring and the patient remained stable.  A transesophageal echo probe was placed by the anesthesia team showing EF to be approximately 45% without significant valvular disease.  The patient was then prepped and  draped as a sterile field and a proper time-out was performed.  A sternal incision was made as the saphenous vein was harvested endoscopically from both the right and left legs.  The left internal mammary artery was harvested as a pedicle graft from its  origin at the subclavian vessels.  It was a 1.5 mm vessel with good flow.  The sternal retractor was  placed and the pericardium was opened and suspended.  Pursestrings were placed in the ascending aorta and right atrium and after heparin was administered, the patient was cannulated and placed on cardiopulmonary bypass.  The  coronaries were identified for grafting and cardioplegia cannulas were placed for the antegrade and retrograde cold blood cardioplegia.  The mammary artery and vein grafts were prepared for the distal anastomoses.  The targets were found to be adequate  for grafting in the distal LAD, the OM1, OM2, and posterior descending.  The patient was cooled to 32 degrees and the aortic crossclamp was applied.  One L of cold blood cardioplegia was delivered in split doses between the antegrade aortic and retrograde coronary sinus catheters.  There was good cardioplegic arrest and  supple temperature dropped less than 12 degrees.  Cardioplegia was delivered and there was effective cardioplegic arrest.  The distal coronary anastomoses were performed.  First, distal anastomosis was the posterior descending branch of the right coronary.  This was a 1.5 mm vessel, proximal 99% stenosis.  Reverse saphenous vein was sewn end-to-side with running 7-0 Prolene  with good flow through the graft.  Cardioplegia was redosed.  The second and third distal anastomoses were then performed using a sequential vein graft to the OM1 and OM2.  The OM1 was a 1.5 mm vessel, proximal 99% left main stenosis and the side of the vein was sewn side-to-side with a running 7-0 Prolene.  The  vein was then extended to the OM2 branch of the left coronary, which also had a proximal 99% left main stenosis.  The end of the vein was sewn end-to-side with running 7-0 Prolene.  Cardioplegia  was redosed through the vein grafts and there was good  flow.  The fourth distal anastomosis was the distal LAD.  It was a 1.5 mm vessel with proximal 99% left main stenosis.  It was a diseased vessel with both calcified areas and very thin,  fragile areas.  The left IMA pedicle was brought through an opening in the  pericardium and was brought down onto the LAD and sewn end-to-side with running 8-0 Prolene.  There was good flow through the anastomosis after briefly releasing the pedicle bulldog on the mammary artery.  The bulldog was reapplied and the pedicle was  secured to the epicardium.  Cardioplegia was redosed.  While the crossclamp was still in place, 2 proximal vein anastomoses were performed on the ascending aorta using a 4.5 mm punch and running 6-0 Prolene.  Prior to tying down the final proximal anastomosis, air was vented from the coronaries with a dose  of retrograde warm blood cardioplegia.  The crossclamp was removed.  The heart resumed a spontaneous rhythm.  The vein grafts were deaired and opened and each had good flow.  Hemostasis was documented at the proximal and distal sites.  The patient was rewarmed and reperfused.  Temporary pacing wires were applied.  The  lungs were expanded and the ventilator was resumed.  The patient was weaned off cardiopulmonary bypass on low dose milrinone with good output and stable hemodynamics.  Echo showed preserved LV function and only 1+ mild mitral regurgitation.  Protamine was administered without adverse reaction.  The  cardioplegia cannulas were removed.  Hemostasis was achieved.  The superior pericardial was closed over the aorta and vein grafts.  Anterior mediastinum and bilateral pleural chest tubes were placed.  It should be noted that a liter of heart failure  fluid was drained from each pleural space after going on cardiopulmonary bypass.  The sternum was then closed with interrupted steel wire.  The pectoralis fascia was closed with a running #1 Vicryl.  Subcutaneous and skin layers were closed with a running Vicryl.  Sterile dressing was applied.  Total cardiopulmonary bypass time was  145 minutes.  VN/NUANCE  D:01/09/2020 T:01/09/2020 JOB:[id number]

## 2020-01-10 ENCOUNTER — Inpatient Hospital Stay (HOSPITAL_COMMUNITY): Payer: Medicare PPO

## 2020-01-10 LAB — CBC
HCT: 23.7 % — ABNORMAL LOW (ref 39.0–52.0)
Hemoglobin: 7.8 g/dL — ABNORMAL LOW (ref 13.0–17.0)
MCH: 30.2 pg (ref 26.0–34.0)
MCHC: 32.9 g/dL (ref 30.0–36.0)
MCV: 91.9 fL (ref 80.0–100.0)
Platelets: 144 10*3/uL — ABNORMAL LOW (ref 150–400)
RBC: 2.58 MIL/uL — ABNORMAL LOW (ref 4.22–5.81)
RDW: 13.2 % (ref 11.5–15.5)
WBC: 11.2 10*3/uL — ABNORMAL HIGH (ref 4.0–10.5)
nRBC: 0 % (ref 0.0–0.2)

## 2020-01-10 LAB — COMPREHENSIVE METABOLIC PANEL
ALT: 24 U/L (ref 0–44)
AST: 30 U/L (ref 15–41)
Albumin: 2.8 g/dL — ABNORMAL LOW (ref 3.5–5.0)
Alkaline Phosphatase: 49 U/L (ref 38–126)
Anion gap: 10 (ref 5–15)
BUN: 14 mg/dL (ref 8–23)
CO2: 24 mmol/L (ref 22–32)
Calcium: 7.7 mg/dL — ABNORMAL LOW (ref 8.9–10.3)
Chloride: 95 mmol/L — ABNORMAL LOW (ref 98–111)
Creatinine, Ser: 1.19 mg/dL (ref 0.61–1.24)
GFR calc Af Amer: >60 mL/min (ref >60)
GFR calc non Af Amer: 58 mL/min — ABNORMAL LOW (ref >60)
Glucose, Bld: 140 mg/dL — ABNORMAL HIGH (ref 70–99)
Potassium: 3.7 mmol/L (ref 3.5–5.1)
Sodium: 129 mmol/L — ABNORMAL LOW (ref 135–145)
Total Bilirubin: 1.5 mg/dL — ABNORMAL HIGH (ref 0.3–1.2)
Total Protein: 4.9 g/dL — ABNORMAL LOW (ref 6.5–8.1)

## 2020-01-10 LAB — GLUCOSE, CAPILLARY
Glucose-Capillary: 141 mg/dL — ABNORMAL HIGH (ref 70–99)
Glucose-Capillary: 165 mg/dL — ABNORMAL HIGH (ref 70–99)
Glucose-Capillary: 112 mg/dL — ABNORMAL HIGH (ref 70–99)
Glucose-Capillary: 127 mg/dL — ABNORMAL HIGH (ref 70–99)

## 2020-01-10 LAB — COOXEMETRY PANEL
Carboxyhemoglobin: 1.4 % (ref 0.5–1.5)
Methemoglobin: 1.2 % (ref 0.0–1.5)
O2 Saturation: 61.2 %
Total hemoglobin: 8 g/dL — ABNORMAL LOW (ref 12.0–16.0)

## 2020-01-10 MED ORDER — OXYCODONE HCL 5 MG PO TABS: 5.0000 mg | ORAL_TABLET | ORAL | Status: DC | PRN

## 2020-01-10 MED ORDER — FUROSEMIDE 10 MG/ML IJ SOLN
40.0000 mg | Freq: Every day | INTRAMUSCULAR | Status: DC
  Administered 2020-01-11 – 2020-01-12 (×2): 40 mg via INTRAVENOUS
  Filled 2020-01-10 (×3): qty 4

## 2020-01-10 MED ORDER — METOCLOPRAMIDE HCL 5 MG/ML IJ SOLN
10.0000 mg | Freq: Four times a day (QID) | INTRAMUSCULAR | Status: AC
  Administered 2020-01-10 – 2020-01-11 (×4): 10 mg via INTRAVENOUS
  Filled 2020-01-10 (×4): qty 2

## 2020-01-10 MED ORDER — INSULIN ASPART 100 UNIT/ML ~~LOC~~ SOLN
0.0000 [IU] | Freq: Three times a day (TID) | SUBCUTANEOUS | Status: DC
  Administered 2020-01-11: 1 [IU] via SUBCUTANEOUS

## 2020-01-10 MED ORDER — POTASSIUM CHLORIDE CRYS ER 20 MEQ PO TBCR
20.0000 meq | EXTENDED_RELEASE_TABLET | ORAL | Status: AC
  Administered 2020-01-10 (×3): 20 meq via ORAL
  Filled 2020-01-10 (×3): qty 1

## 2020-01-10 MED ORDER — MILRINONE LACTATE IN DEXTROSE 20-5 MG/100ML-% IV SOLN: 0.1250 ug/kg/min | INTRAVENOUS | Status: DC

## 2020-01-10 MED ORDER — INSULIN ASPART 100 UNIT/ML ~~LOC~~ SOLN: 0.0000 [IU] | Freq: Every day | SUBCUTANEOUS | Status: DC

## 2020-01-10 MED ORDER — LACTATED RINGERS IV SOLN: INTRAVENOUS | Status: DC

## 2020-01-10 MED ORDER — FE FUMARATE-B12-VIT C-FA-IFC PO CAPS
1.0000 | ORAL_CAPSULE | Freq: Two times a day (BID) | ORAL | Status: DC
  Administered 2020-01-10 – 2020-01-13 (×8): 1 via ORAL
  Filled 2020-01-10 (×10): qty 1

## 2020-01-10 NOTE — Discharge Instructions (Signed)
Discharge Instructions: ° °1. You Elliff shower, please wash incisions daily with soap and water and keep dry.  If you wish to cover wounds with dressing you [place]arez do so but please keep clean and change daily.  No tub baths or swimming until incisions have completely healed.  If your incisions become red or develop any drainage please call our office at [phone] ° °2. No Driving until cleared by our office and you are no longer using narcotic pain medications ° °3. Monitor your weight daily.. Please use the same scale and weigh at same time... If you gain 3-5 lbs in 48 hours with associated lower extremity swelling, please contact our office at [phone] ° °4. Fever of 101.5 for at least 24 hours with no source, please contact our office at [phone] ° °5. Activity- up as tolerated, please walk at least 3 times per day.  Avoid strenuous activity, no lifting, pushing, or pulling with your arms over 8-10 lbs for a minimum of 6 weeks ° °6. If any questions or concerns arise, please do not hesitate to contact our office at [phone] °

## 2020-01-10 NOTE — Progress Notes (Addendum)
[facility] DAILY ICU PROGRESS NOTE                   [address]            [place] [id number]          [phone]   2 Days Post-Op Procedure(s) (LRB): CORONARY ARTERY BYPASS GRAFTING (CABG) x 4  WITH ENDOSCOPIC HARVESTING OF BILATERAL GREATER SAPHENOUS VEINS (N/A) TRANSESOPHAGEAL ECHOCARDIOGRAM (TEE) (N/A)  Total Length of Stay:  LOS: 2 days   Subjective: Feels okay but did not sleep well last night. Plans to walk this morning. Using incentive spirometer.   Objective: Vital signs in last 24 hours: Temp:  [97.7 F (36.5 C)-100 F (37.8 C)] 98 F (36.7 C) (01/20 0700) Pulse Rate:  [74-86] 74 (01/20 0800) Cardiac Rhythm: Normal sinus rhythm (01/20 0800) Resp:  [6-24] 10 (01/20 0800) BP: (98-122)/(42-69) 112/53 (01/20 0800) SpO2:  [93 %-99 %] 99 % (01/20 0800) Arterial Line BP: (116-158)/(41-50) 116/41 (01/19 1200) Weight:  [65.6 kg] 65.6 kg (01/20 0500)  Filed Weights   01/08/20 2130 01/09/20 0500 01/10/20 0500  Weight: 63.6 kg 64.7 kg 65.6 kg    Weight change: 2 kg   Hemodynamic parameters for last 24 hours: PAP: (28-34)/(13-17) 28/13 CVP:  [8 mmHg-11 mmHg] 8 mmHg  Intake/Output from previous day: 01/19 0701 - 01/20 0700 In: 738.9 [I.V.:592.1; IV Piggyback:146.8] Out: 1793 [Urine:1260; Chest Tube:533]  Intake/Output this shift: Total I/O In: 24.8 [I.V.:24.8] Out: 40 [Urine:40]  Current Meds: Scheduled Meds: . acetaminophen  1,000 mg Oral Q6H   Or  . acetaminophen (TYLENOL) oral liquid 160 mg/5 mL  1,000 mg Per Tube Q6H  . aspirin EC  325 mg Oral Daily   Or  . aspirin  324 mg Per Tube Daily  . bisacodyl  10 mg Oral Daily   Or  . bisacodyl  10 mg Rectal Daily  . Chlorhexidine Gluconate Cloth  6 each Topical Daily  . docusate sodium  200 mg Oral Daily  . feeding supplement (ENSURE ENLIVE)  237 mL Oral BID BM  . furosemide  20 mg Intravenous BID  . insulin aspart  0-24 Units Subcutaneous Q4H  . mouth rinse  15 mL Mouth Rinse BID  .  metoprolol tartrate  12.5 mg Oral BID   Or  . metoprolol tartrate  12.5 mg Per Tube BID  . multivitamin with minerals  1 tablet Oral Daily  . pantoprazole  40 mg Oral Daily  . potassium chloride  20 mEq Oral Q4H  . simethicone  80 mg Oral TID AC & HS  . sodium chloride flush  10-40 mL Intracatheter Q12H  . sodium chloride flush  3 mL Intravenous Q12H   Continuous Infusions: . sodium chloride    . sodium chloride    . sodium chloride 10 mL/hr (01/08/20 2119)  . lactated ringers    . lactated ringers 20 mL/hr at 01/10/20 0800  . milrinone 0.25 mcg/kg/min (01/10/20 0800)  . nitroGLYCERIN Stopped (01/08/20 1912)  . phenylephrine (NEO-SYNEPHRINE) Adult infusion 20 mcg/min (01/10/20 0643)   PRN Meds:.sodium chloride, metoprolol tartrate, midazolam, morphine injection, ondansetron (ZOFRAN) IV, oxyCODONE, sodium chloride flush, sodium chloride flush, traMADol  General appearance: alert, cooperative and no distress Heart: regular rate and rhythm, S1, S2 normal, no murmur, click, rub or gallop Lungs: clear to auscultation bilaterally and diminished in the lower lobes Abdomen: soft, non-tender; bowel sounds normal; no masses,  no organomegaly Extremities: + edema in lower extremity Wound: clean  and dry covered with sterile dressing  Lab Results: CBC: Recent Labs    01/09/20 1726 01/10/20 0456  WBC 13.5* 11.2*  HGB 8.6* 7.8*  HCT 26.2* 23.7*  PLT 167 144*   BMET:  Recent Labs    01/09/20 1726 01/10/20 0456  NA 130* 129*  K 4.0 3.7  CL 97* 95*  CO2 23 24  GLUCOSE 182* 140*  BUN 13 14  CREATININE 1.13 1.19  CALCIUM 7.8* 7.7*    CMET: Lab Results  Component Value Date   WBC 11.2 (H) 01/10/2020   HGB 7.8 (L) 01/10/2020   HCT 23.7 (L) 01/10/2020   PLT 144 (L) 01/10/2020   GLUCOSE 140 (H) 01/10/2020   CHOL 152 01/08/2020   TRIG 64 01/08/2020   HDL 34 (L) 01/08/2020   LDLDIRECT 188.8 04/05/2012   LDLCALC 105 (H) 01/08/2020   ALT 24 01/10/2020   AST 30 01/10/2020   NA  129 (L) 01/10/2020   K 3.7 01/10/2020   CL 95 (L) 01/10/2020   CREATININE 1.19 01/10/2020   BUN 14 01/10/2020   CO2 24 01/10/2020   TSH 2.687 01/08/2020   PSA 1.26 07/11/2010   INR 1.6 (H) 01/08/2020   HGBA1C 5.4 01/08/2020      PT/INR:  Recent Labs    01/08/20 1920  LABPROT 19.1*  INR 1.6*   Radiology: [facility] Chest Port 1 View  Result Date: 01/10/2020 CLINICAL DATA:  79 year old male with history of myocardial infarction status post CABG. EXAM: PORTABLE CHEST 1 VIEW COMPARISON:  Chest x-ray 01/09/2020. FINDINGS: Previously noted Swan-Ganz catheter has been removed. Right IJ Cordis remains stable in position with tip proximal superior vena cava. Bilateral chest tubes are stable in position with tips and side ports projecting over the thorax. Epicardial pacing wires are noted. Lung volumes are low. Bibasilar opacities (left greater than right) likely reflect resolving postoperative subsegmental atelectasis. Small left pleural effusion. No definite right pleural effusion. No appreciable pneumothorax. No evidence of pulmonary edema. Heart size is upper limits of normal. Upper mediastinal contours are within normal limits. IMPRESSION: 1. Postoperative changes and support apparatus, as above. 2. Low lung volumes with persistent bibasilar (left greater than right) postoperative subsegmental atelectasis with small left pleural effusion. Electronically Signed   By: Vinnie Langton M.D.   On: 01/10/2020 08:23     Assessment/Plan: S/P Procedure(s) (LRB): CORONARY ARTERY BYPASS GRAFTING (CABG) x 4  WITH ENDOSCOPIC HARVESTING OF BILATERAL GREATER SAPHENOUS VEINS (N/A) TRANSESOPHAGEAL ECHOCARDIOGRAM (TEE) (N/A)   CABG x 4 on 01/08/2020  1. CV-NSR in the 70s, BP well controlled. Continue ASA, statin allergy, and metoprolol. Remains on milrinone 0.25 and Neo 20 coox 61.2 2. Pulm-tolerating 2L oxygen with excellent saturation. Continue incentive spirometer. Chest tubes with 533cc/24 hours. 3.  Renal-creatine 1.19, electrolytes okay.  4. Endo-blood glucose has been well controlled. Continue SSI Not a diabetic, last A1C was 5.4 5. H and H 8.6/26.2, expected acute blood loss anemia. Stable.  6. OOB to chair.   Plan: Remove chest tubes. Lasix IV today with potassium replacement. Continue arterial line until off Neo. Ambulate in the halls today. Work on diet and oral intake. Continue use of incentive spirometer. Remove foley catheter.     [name] 01/10/2020 8:31 AM  patient examined and medical record reviewed,agree with above note. [name] III 01/10/2020

## 2020-01-10 NOTE — Progress Notes (Signed)
CT surgery p.m. Rounds  Patient had stable day walked in hallway x2 Weaning milrinone Diuresing well Chest tubes removed Maintaining sinus rhythm

## 2020-01-10 NOTE — Discharge Summary (Addendum)
[address]       [place] [address]             [phone]      Physician Discharge Summary  Patient ID: [name] MRN: [id number] DOB/AGE: [birth year] 79 y.o.  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 Fletcher Anon for coordination of care.  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  He was admitted yesterday through the ED with positive cardiac enzymes and placed on heparin.  He underwent cardiac catheterization today which shows a 99% left main stenosis and 99% ostial RCA stenosis.  EF is 45% with LVEDP of 22 mmHg.  Patient was transferred to this hospital for urgentemergent coronary artery bypass surgery.   I agree with the recommendation for coronary bypass surgery and discussed the procedure in detail with the patient including the expected benefits and risks.  The patient appears to have adequate but small targets for grafting and adequate LV function with EF  45%.   Patient had preoperative Doppler ultrasound exam which shows no significant carotid disease.  His preoperative chest x-ray shows mild interstitial edema.  Hospital Course:   On 01/08/2020 Mr. [name] coronary bypass grafting x4 with Dr. Prescott Gum.  He tolerated procedure well and was transferred to the surgical ICU.  He was extubated in a timely manner.Patient had some moderate LV dysfunction.  He was on low-dose milrinone.  He did have metabolic acidosis which we treated.  His house was 79% on milrinone.  They discontinued his swan ganz catheter.  He started Lasix for fluid overload.  Postop day 2 he sitting up in the chair.  Complaint of not sleeping well.  He remained on Neo-Synephrine.  He is tolerating 2 L nasal cannula with oxygen saturation.  We discontinued his chest tubes and urinary catheter today.  We transitioned him from insulin drip to.  His hemoglobin and hematocrit remained stable.  We added IV Lasix for fluid overload.  We encouraged him to walk in the halls several times. On the floor, he continued to progress. His blood pressure and heart rate remained stable. We discontinued his EPW on 1/23. We discontinued his SSI since he was not a diabetic. His saturations remained good on room air. He continued to ambulate several times a day around the unit. He did have some delirium his last day in the ICU but this seemed to clear up within 24 hours. On POD 6 he was ambulating with limited assistance, his incisions were healing well, he was tolerating  room air with excellent oxygen saturation, and he was ready for discharge home.     Consults: None  Significant Diagnostic Studies:   CLINICAL DATA:  79 year old male with history of myocardial infarction status post CABG.   EXAM: PORTABLE CHEST 1 VIEW   COMPARISON:  Chest x-ray 01/09/2020.   FINDINGS: Previously noted Swan-Ganz catheter has been removed. Right IJ Cordis remains stable in position with tip proximal superior vena cava.  Bilateral chest tubes are stable in position with tips and side ports projecting over the thorax. Epicardial pacing wires are noted. Lung volumes are low. Bibasilar opacities (left greater than right) likely reflect resolving postoperative subsegmental atelectasis. Small left pleural effusion. No definite right pleural effusion. No appreciable pneumothorax. No evidence of pulmonary edema. Heart size is upper limits of normal. Upper mediastinal contours are within normal limits.   IMPRESSION: 1. Postoperative changes and support apparatus, as above. 2. Low lung volumes with persistent bibasilar (left greater than right) postoperative subsegmental atelectasis with small left pleural effusion.     Electronically Signed   By: Vinnie Langton M.D.   On: 01/10/2020 08:23  Treatments:  NAME: TIVIS, DESANCTIS MEDICAL RECORD [id number] ACCOUNT [id number] DATE OF BIRTH:23-Aug-1941 FACILITY: [facility] LOCATION: [facility] PHYSICIAN:Taj Nevins VAN TRIGT III, MD   OPERATIVE REPORT   DATE OF PROCEDURE:  01/08/2020   OPERATION: 1.  Emergency coronary artery bypass grafting x4 (left internal mammary artery to left anterior descending, saphenous vein graft to posterior  descending, saphenous vein graft sequentially to the obtuse marginal 1 and obtuse marginal 2 circumflex  branches). 2.  Endoscopic harvest of bilateral greater saphenous vein.   PREOPERATIVE DIAGNOSES:   1.  Critical left main stenosis.  2.  Non-ST elevation myocardial infarction.  3.  Severe 3-vessel coronary artery disease.  4.  Moderate left ventricular dysfunction.   POSTOPERATIVE DIAGNOSES:   1.  Critical left main stenosis.  2.  Non-ST elevation myocardial infarction.  3.  Severe 3-vessel coronary artery disease.  4.  Moderate left ventricular dysfunction.   SURGEON:  Ivin Poot, MD   ASSISTANT:  Enid Cutter, PA-C.   ANESTHESIA:  General by Dr. Lennette Bihari   Discharge Exam: Blood pressure 133/64, pulse 78,  temperature 98 F (36.7 C), temperature source Oral, resp. rate (!) 22, height 5\' 7"  (1.702 m), weight 63.4 kg, SpO2 97 %.   General appearance: alert, cooperative and no distress Heart: regular rate and rhythm Lungs: min dim in bases Abdomen: benign Extremities: no edema Wound: incis healing well without signs of infection    Disposition:  Discharge disposition: 01-Home or Self Care       Discharge Instructions     Amb Referral to Cardiac Rehabilitation   Complete by: As directed    Diagnosis:  NSTEMI CABG     CABG X ___: 4   After initial evaluation and assessments completed: Virtual Based Care [place] be provided alone or in conjunction with Phase 2 Cardiac Rehab based on patient barriers.: Yes   Discharge patient   Complete by: As directed    Discharge disposition: 01-Home or Self Care   Discharge patient date: 01/14/2020      Allergies as of 01/14/2020       Reactions   Crestor [rosuvastatin] Other (See Comments)   Severe myalgias   Penicillins Rash   Peanut-containing Drug Products    Atorvastatin Other (See Comments)   ache   Simvastatin Other (See Comments)   aching        Medication List  STOP taking these medications    amLODipine 5 MG tablet Commonly known as: NORVASC   hydrALAZINE 25 MG tablet Commonly known as: APRESOLINE   nitroGLYCERIN 0.4 MG SL tablet Commonly known as: NITROSTAT   pravastatin 20 MG tablet Commonly known as: PRAVACHOL       TAKE these medications    aspirin 325 MG EC tablet Take 1 tablet (325 mg total) by mouth daily.   ferrous Q000111Q C-folic acid capsule Commonly known as: TRINSICON / FOLTRIN Take 1 capsule by mouth 2 (two) times daily after a meal.   hyoscyamine 0.125 MG Tbdp disintergrating tablet Commonly known as: ANASPAZ Place 0.125 mg under the tongue 2 (two) times daily as needed for bladder spasms.   lisinopril 2.5 MG tablet Commonly known as: ZESTRIL Take 1 tablet (2.5 mg total)  by mouth daily.   lovastatin 20 MG tablet Commonly known as: MEVACOR Take 20 mg by mouth daily.   METAMUCIL PO Take 1 Dose by mouth at bedtime. Mix with water   metoprolol tartrate 25 MG tablet Commonly known as: LOPRESSOR Take 0.5 tablets (12.5 mg total) by mouth 2 (two) times daily.   multivitamin with minerals Tabs tablet Take 1 tablet by mouth daily.   traMADol 50 MG tablet Commonly known as: ULTRAM Take 1 tablet (50 mg total) by mouth every 12 (twelve) hours as needed for up to 7 days for moderate pain.       Follow-up Information     Theora Gianotti, NP Follow up.   Specialties: Nurse Practitioner, Cardiology, Radiology Why: Ignacia Bayley, Np 2/4 @11 :20 am ([place] [facility])  Contact information: [place] [address] [phone]         Venia Carbon, MD. Call in 1 day(s).   Specialties: Internal Medicine, Pediatrics Contact information: [address] [place] [address] [phone]         Ivin Poot, MD Follow up.   Specialty: Cardiothoracic Surgery Why: Your routine follow-up appointment is on 02/07/2020 at 3:30pm. Please arrive at 3:00pm for a chest xray located at [facility] which is on the first floor of our building.  Contact information: [place] [address] [phone]           The patient has been discharged on:   1.Beta Blocker:  Yes [ yes  ]                              No   [   ]                              If No, reason:  2.Ace Inhibitor/ARB: Yes Blue.Reese   ]                                     No  [    ]                                     If No, reason:  3.Statin:   Yes [ y  ]                  No  [   ]  If No, reason: statin allergy  4.Shela CommonsVelta Addison  [ yes  ]                  No   [   ]                  If No, reason:  ** Patient discharge by Jadene Pierini, PA-C on 1/24  Signed: [name] 01/15/2020, 10:38 AM    patient examined and medical record reviewed,agree with above note. [name] III 01/15/2020

## 2020-01-11 ENCOUNTER — Inpatient Hospital Stay (HOSPITAL_COMMUNITY): Payer: Medicare PPO

## 2020-01-11 DIAGNOSIS — I4891 Unspecified atrial fibrillation: Secondary | ICD-10-CM

## 2020-01-11 HISTORY — DX: Unspecified atrial fibrillation: I48.91

## 2020-01-11 LAB — POCT I-STAT 7, (LYTES, BLD GAS, ICA,H+H)
Acid-base deficit: 1 mmol/L (ref 0.0–2.0)
Acid-base deficit: 1 mmol/L (ref 0.0–2.0)
Bicarbonate: 24.3 mmol/L (ref 20.0–28.0)
Bicarbonate: 24.6 mmol/L (ref 20.0–28.0)
Calcium, Ion: 0.9 mmol/L — ABNORMAL LOW (ref 1.15–1.40)
Calcium, Ion: 1.03 mmol/L — ABNORMAL LOW (ref 1.15–1.40)
HCT: 25 % — ABNORMAL LOW (ref 39.0–52.0)
HCT: 33 % — ABNORMAL LOW (ref 39.0–52.0)
Hemoglobin: 8.5 g/dL — ABNORMAL LOW (ref 13.0–17.0)
Hemoglobin: 11.2 g/dL — ABNORMAL LOW (ref 13.0–17.0)
O2 Saturation: 100 %
O2 Saturation: 100 %
Potassium: 4.3 mmol/L (ref 3.5–5.1)
Potassium: 4.7 mmol/L (ref 3.5–5.1)
Sodium: 138 mmol/L (ref 135–145)
Sodium: 139 mmol/L (ref 135–145)
TCO2: 26 mmol/L (ref 22–32)
TCO2: 26 mmol/L (ref 22–32)
pCO2 arterial: 43.8 mmHg (ref 32.0–48.0)
pCO2 arterial: 45.1 mmHg (ref 32.0–48.0)
pH, Arterial: 7.353 (ref 7.350–7.450)
pH, Arterial: 7.345 — ABNORMAL LOW (ref 7.350–7.450)
pO2, Arterial: 339 mmHg — ABNORMAL HIGH (ref 83.0–108.0)
pO2, Arterial: 417 mmHg — ABNORMAL HIGH (ref 83.0–108.0)

## 2020-01-11 LAB — GLUCOSE, CAPILLARY
Glucose-Capillary: 107 mg/dL — ABNORMAL HIGH (ref 70–99)
Glucose-Capillary: 116 mg/dL — ABNORMAL HIGH (ref 70–99)
Glucose-Capillary: 149 mg/dL — ABNORMAL HIGH (ref 70–99)
Glucose-Capillary: 105 mg/dL — ABNORMAL HIGH (ref 70–99)
Glucose-Capillary: 88 mg/dL (ref 70–99)

## 2020-01-11 LAB — COOXEMETRY PANEL
Carboxyhemoglobin: 1.1 % (ref 0.5–1.5)
Methemoglobin: 0.7 % (ref 0.0–1.5)
O2 Saturation: 63.5 %
Total hemoglobin: 9.8 g/dL — ABNORMAL LOW (ref 12.0–16.0)

## 2020-01-11 LAB — POCT I-STAT, CHEM 8
BUN: 7 mg/dL — ABNORMAL LOW (ref 8–23)
BUN: 6 mg/dL — ABNORMAL LOW (ref 8–23)
BUN: 7 mg/dL — ABNORMAL LOW (ref 8–23)
BUN: 7 mg/dL — ABNORMAL LOW (ref 8–23)
BUN: 8 mg/dL (ref 8–23)
BUN: 8 mg/dL (ref 8–23)
Calcium, Ion: 1.15 mmol/L (ref 1.15–1.40)
Calcium, Ion: 0.93 mmol/L — ABNORMAL LOW (ref 1.15–1.40)
Calcium, Ion: 1.11 mmol/L — ABNORMAL LOW (ref 1.15–1.40)
Calcium, Ion: 1.01 mmol/L — ABNORMAL LOW (ref 1.15–1.40)
Calcium, Ion: 1.03 mmol/L — ABNORMAL LOW (ref 1.15–1.40)
Calcium, Ion: 1.04 mmol/L — ABNORMAL LOW (ref 1.15–1.40)
Chloride: 104 mmol/L (ref 98–111)
Chloride: 105 mmol/L (ref 98–111)
Chloride: 99 mmol/L (ref 98–111)
Chloride: 104 mmol/L (ref 98–111)
Chloride: 104 mmol/L (ref 98–111)
Chloride: 104 mmol/L (ref 98–111)
Creatinine, Ser: 0.7 mg/dL (ref 0.61–1.24)
Creatinine, Ser: 0.4 mg/dL — ABNORMAL LOW (ref 0.61–1.24)
Creatinine, Ser: 0.7 mg/dL (ref 0.61–1.24)
Creatinine, Ser: 0.6 mg/dL — ABNORMAL LOW (ref 0.61–1.24)
Creatinine, Ser: 0.5 mg/dL — ABNORMAL LOW (ref 0.61–1.24)
Creatinine, Ser: 0.5 mg/dL — ABNORMAL LOW (ref 0.61–1.24)
Glucose, Bld: 100 mg/dL — ABNORMAL HIGH (ref 70–99)
Glucose, Bld: 101 mg/dL — ABNORMAL HIGH (ref 70–99)
Glucose, Bld: 109 mg/dL — ABNORMAL HIGH (ref 70–99)
Glucose, Bld: 137 mg/dL — ABNORMAL HIGH (ref 70–99)
Glucose, Bld: 139 mg/dL — ABNORMAL HIGH (ref 70–99)
Glucose, Bld: 143 mg/dL — ABNORMAL HIGH (ref 70–99)
HCT: 33 % — ABNORMAL LOW (ref 39.0–52.0)
HCT: 24 % — ABNORMAL LOW (ref 39.0–52.0)
HCT: 31 % — ABNORMAL LOW (ref 39.0–52.0)
HCT: 25 % — ABNORMAL LOW (ref 39.0–52.0)
HCT: 24 % — ABNORMAL LOW (ref 39.0–52.0)
HCT: 23 % — ABNORMAL LOW (ref 39.0–52.0)
Hemoglobin: 11.2 g/dL — ABNORMAL LOW (ref 13.0–17.0)
Hemoglobin: 10.5 g/dL — ABNORMAL LOW (ref 13.0–17.0)
Hemoglobin: 8.2 g/dL — ABNORMAL LOW (ref 13.0–17.0)
Hemoglobin: 8.5 g/dL — ABNORMAL LOW (ref 13.0–17.0)
Hemoglobin: 8.2 g/dL — ABNORMAL LOW (ref 13.0–17.0)
Hemoglobin: 7.8 g/dL — ABNORMAL LOW (ref 13.0–17.0)
Potassium: 3.7 mmol/L (ref 3.5–5.1)
Potassium: 4.1 mmol/L (ref 3.5–5.1)
Potassium: 4.3 mmol/L (ref 3.5–5.1)
Potassium: 4.7 mmol/L (ref 3.5–5.1)
Potassium: 5.7 mmol/L — ABNORMAL HIGH (ref 3.5–5.1)
Potassium: 4.6 mmol/L (ref 3.5–5.1)
Sodium: 139 mmol/L (ref 135–145)
Sodium: 137 mmol/L (ref 135–145)
Sodium: 139 mmol/L (ref 135–145)
Sodium: 137 mmol/L (ref 135–145)
Sodium: 136 mmol/L (ref 135–145)
Sodium: 138 mmol/L (ref 135–145)
TCO2: 21 mmol/L — ABNORMAL LOW (ref 22–32)
TCO2: 21 mmol/L — ABNORMAL LOW (ref 22–32)
TCO2: 22 mmol/L (ref 22–32)
TCO2: 26 mmol/L (ref 22–32)
TCO2: 24 mmol/L (ref 22–32)
TCO2: 26 mmol/L (ref 22–32)

## 2020-01-11 LAB — COMPREHENSIVE METABOLIC PANEL
ALT: 37 U/L (ref 0–44)
AST: 36 U/L (ref 15–41)
Albumin: 2.7 g/dL — ABNORMAL LOW (ref 3.5–5.0)
Alkaline Phosphatase: 71 U/L (ref 38–126)
Anion gap: 9 (ref 5–15)
BUN: 15 mg/dL (ref 8–23)
CO2: 24 mmol/L (ref 22–32)
Calcium: 7.9 mg/dL — ABNORMAL LOW (ref 8.9–10.3)
Chloride: 94 mmol/L — ABNORMAL LOW (ref 98–111)
Creatinine, Ser: 0.91 mg/dL (ref 0.61–1.24)
GFR calc Af Amer: >60 mL/min (ref >60)
GFR calc non Af Amer: >60 mL/min (ref >60)
Glucose, Bld: 125 mg/dL — ABNORMAL HIGH (ref 70–99)
Potassium: 5 mmol/L (ref 3.5–5.1)
Sodium: 127 mmol/L — ABNORMAL LOW (ref 135–145)
Total Bilirubin: 1.4 mg/dL — ABNORMAL HIGH (ref 0.3–1.2)
Total Protein: 4.9 g/dL — ABNORMAL LOW (ref 6.5–8.1)

## 2020-01-11 LAB — CBC
HCT: 25.8 % — ABNORMAL LOW (ref 39.0–52.0)
Hemoglobin: 8.5 g/dL — ABNORMAL LOW (ref 13.0–17.0)
MCH: 30.2 pg (ref 26.0–34.0)
MCHC: 32.9 g/dL (ref 30.0–36.0)
MCV: 91.8 fL (ref 80.0–100.0)
Platelets: 175 10*3/uL (ref 150–400)
RBC: 2.81 MIL/uL — ABNORMAL LOW (ref 4.22–5.81)
RDW: 13.1 % (ref 11.5–15.5)
WBC: 11.8 10*3/uL — ABNORMAL HIGH (ref 4.0–10.5)
nRBC: 0 % (ref 0.0–0.2)

## 2020-01-11 MED ORDER — ~~LOC~~ CARDIAC SURGERY, PATIENT & FAMILY EDUCATION: Freq: Once | Status: DC

## 2020-01-11 MED ORDER — SODIUM CHLORIDE 0.9 % IV SOLN: 250.0000 mL | INTRAVENOUS | Status: DC | PRN

## 2020-01-11 MED ORDER — SODIUM CHLORIDE 0.9% FLUSH: 3.0000 mL | INTRAVENOUS | Status: DC | PRN

## 2020-01-11 MED ORDER — SODIUM CHLORIDE 0.9% FLUSH
3.0000 mL | Freq: Two times a day (BID) | INTRAVENOUS | Status: DC
  Administered 2020-01-11 – 2020-01-14 (×7): 3 mL via INTRAVENOUS

## 2020-01-11 MED ORDER — CHLORHEXIDINE GLUCONATE CLOTH 2 % EX PADS
6.0000 | MEDICATED_PAD | Freq: Every day | CUTANEOUS | Status: DC
  Administered 2020-01-12: 6 via TOPICAL

## 2020-01-11 MED FILL — Heparin Sodium (Porcine) Inj 1000 Unit/ML: INTRAMUSCULAR | Qty: 10 | Status: AC

## 2020-01-11 MED FILL — Heparin Sodium (Porcine) Inj 1000 Unit/ML: INTRAMUSCULAR | Qty: 30 | Status: AC

## 2020-01-11 MED FILL — Sodium Bicarbonate IV Soln 8.4%: INTRAVENOUS | Qty: 50 | Status: AC

## 2020-01-11 MED FILL — Electrolyte-R (PH 7.4) Solution: INTRAVENOUS | Qty: 4000 | Status: AC

## 2020-01-11 MED FILL — Magnesium Sulfate Inj 50%: INTRAMUSCULAR | Qty: 10 | Status: AC

## 2020-01-11 MED FILL — Potassium Chloride Inj 2 mEq/ML: INTRAVENOUS | Qty: 40 | Status: AC

## 2020-01-11 MED FILL — Sodium Chloride IV Soln 0.9%: INTRAVENOUS | Qty: 3000 | Status: AC

## 2020-01-11 MED FILL — Mannitol IV Soln 20%: INTRAVENOUS | Qty: 500 | Status: AC

## 2020-01-11 MED FILL — Lidocaine HCl Local Soln Prefilled Syringe 100 MG/5ML (2%): INTRAMUSCULAR | Qty: 5 | Status: AC

## 2020-01-11 NOTE — Progress Notes (Signed)
[facility] BRIEF SICU PROGRESS NOTE  3 Days Post-Op  S/P Procedure(s) (LRB): CORONARY ARTERY BYPASS GRAFTING (CABG) x 4  WITH ENDOSCOPIC HARVESTING OF BILATERAL GREATER SAPHENOUS VEINS (N/A) TRANSESOPHAGEAL ECHOCARDIOGRAM (TEE) (N/A)   Waiting for bed to transfer out of ICU NSR w/ occasional brief episodes Afib  Plan: Continue current plan  Rexene Alberts, MD 01/11/2020 5:06 PM

## 2020-01-11 NOTE — Progress Notes (Signed)
Informed Dr. Prescott Gum and Tess,P.A. of A-Fib=> 4.08 sec. Pause=>NSR at [phone].  No new orders given.  Will continue to monitor.  Patient did ambulate 740 feet w/Avasys tolerated well.

## 2020-01-11 NOTE — Progress Notes (Signed)
Patient in controlled A-Fib 90s had a 4.08 second pause then converted to NSR. Patient indicated "felt dizzy". VSS will continue to monitor. Connected to TPM mode currently off.  Will inform PA/MD.

## 2020-01-11 NOTE — Progress Notes (Addendum)
T[facility] DAILY ICU PROGRESS NOTE                   [name][address]            [place] [address]          [phone]   3 Days Post-Op Procedure(s) (LRB): CORONARY ARTERY BYPASS GRAFTING (CABG) x 4  WITH ENDOSCOPIC HARVESTING OF BILATERAL GREATER SAPHENOUS VEINS (N/A) TRANSESOPHAGEAL ECHOCARDIOGRAM (TEE) (N/A)  Total Length of Stay:  LOS: 3 days   Subjective: Feels okay this morning. Just got back from a walk.   Objective: Vital signs in last 24 hours: Temp:  [98 F (36.7 C)-99 F (37.2 C)] 98.1 F (36.7 C) (01/21 0640) Pulse Rate:  [68-88] 85 (01/21 0900) Cardiac Rhythm: (P) Normal sinus rhythm (01/21 0800) Resp:  [10-25] 20 (01/21 0900) BP: (106-146)/(49-102) 125/61 (01/21 0900) SpO2:  [93 %-100 %] 97 % (01/21 0900) Weight:  [68.3 kg] 68.3 kg (01/21 0500)  Filed Weights   01/09/20 0500 01/10/20 0500 01/11/20 0500  Weight: 64.7 kg 65.6 kg 68.3 kg    Weight change: 2.7 kg     Intake/Output from previous day: 01/20 0701 - 01/21 0700 In: 533.7 [P.O.:240; I.V.:293.7] Out: 835 [Urine:745; Chest Tube:90]  Intake/Output this shift: No intake/output data recorded.  Current Meds: Scheduled Meds: . acetaminophen  1,000 mg Oral Q6H   Or  . acetaminophen (TYLENOL) oral liquid 160 mg/5 mL  1,000 mg Per Tube Q6H  . aspirin EC  325 mg Oral Daily   Or  . aspirin  324 mg Per Tube Daily  . bisacodyl  10 mg Oral Daily   Or  . bisacodyl  10 mg Rectal Daily  . Chlorhexidine Gluconate Cloth  6 each Topical Daily  . docusate sodium  200 mg Oral Daily  . feeding supplement (ENSURE ENLIVE)  237 mL Oral BID BM  . ferrous Q000111Q C-folic acid  1 capsule Oral BID PC  . furosemide  40 mg Intravenous Daily  . insulin aspart  0-5 Units Subcutaneous QHS  . insulin aspart  0-9 Units Subcutaneous TID WC  . mouth rinse  15 mL Mouth Rinse BID  . metoprolol tartrate  12.5 mg Oral BID   Or  . metoprolol tartrate  12.5 mg Per Tube BID  . multivitamin with minerals   1 tablet Oral Daily  . pantoprazole  40 mg Oral Daily  . simethicone  80 mg Oral TID AC & HS  . sodium chloride flush  10-40 mL Intracatheter Q12H  . sodium chloride flush  3 mL Intravenous Q12H   Continuous Infusions: . sodium chloride    . sodium chloride    . sodium chloride 10 mL/hr (01/08/20 2119)  . lactated ringers Stopped (01/11/20 0512)   PRN Meds:.sodium chloride, metoprolol tartrate, ondansetron (ZOFRAN) IV, oxyCODONE, sodium chloride flush, sodium chloride flush, traMADol  General appearance: alert, cooperative and no distress Heart: regular rate and rhythm, S1, S2 normal, no murmur, click, rub or gallop Lungs: clear to auscultation bilaterally Abdomen: soft, non-tender; bowel sounds normal; no masses,  no organomegaly Extremities: extremities normal, atraumatic, no cyanosis or edema Wound: clean and dry  Lab Results: CBC: Recent Labs    01/10/20 0456 01/11/20 0305  WBC 11.2* 11.8*  HGB 7.8* 8.5*  HCT 23.7* 25.8*  PLT 144* 175   BMET:  Recent Labs    01/10/20 0456 01/11/20 0305  NA 129* 127*  K 3.7 5.0  CL 95* 94*  CO2  24 24  GLUCOSE 140* 125*  BUN 14 15  CREATININE 1.19 0.91  CALCIUM 7.7* 7.9*    CMET: Lab Results  Component Value Date   WBC 11.8 (H) 01/11/2020   HGB 8.5 (L) 01/11/2020   HCT 25.8 (L) 01/11/2020   PLT 175 01/11/2020   GLUCOSE 125 (H) 01/11/2020   CHOL 152 01/08/2020   TRIG 64 01/08/2020   HDL 34 (L) 01/08/2020   LDLDIRECT 188.8 04/05/2012   LDLCALC 105 (H) 01/08/2020   ALT 37 01/11/2020   AST 36 01/11/2020   NA 127 (L) 01/11/2020   K 5.0 01/11/2020   CL 94 (L) 01/11/2020   CREATININE 0.91 01/11/2020   BUN 15 01/11/2020   CO2 24 01/11/2020   TSH 2.687 01/08/2020   PSA 1.26 07/11/2010   INR 1.6 (H) 01/08/2020   HGBA1C 5.4 01/08/2020      PT/INR:  Recent Labs    01/08/20 1920  LABPROT 19.1*  INR 1.6*   Radiology: [facility] Chest Port 1 View  Result Date: 01/11/2020 CLINICAL DATA:  History of CABG. EXAM: PORTABLE  CHEST 1 VIEW COMPARISON:  01/10/2020. FINDINGS: Interim removal of bilateral chest tubes. Right IJ sheath in stable position. Prior CABG. Cardiomegaly. Low lung volumes with bibasilar atelectasis. Bilateral pulmonary infiltrates/edema noted on today's exam. Small left pleural effusion. CHF should be considered. No pneumothorax. IMPRESSION: 1. Interim removal of bilateral chest tubes. No pneumothorax. Right IJ sheath in stable position. 2. Prior CABG. Cardiomegaly again noted. Diffuse bilateral pulmonary infiltrates/edema. Small left pleural effusion. CHF should be considered. 3.  Low lung volumes with bibasilar atelectasis. Electronically Signed   By: Marcello Moores  Register   On: 01/11/2020 07:11     Assessment/Plan: S/P Procedure(s) (LRB): CORONARY ARTERY BYPASS GRAFTING (CABG) x 4  WITH ENDOSCOPIC HARVESTING OF BILATERAL GREATER SAPHENOUS VEINS (N/A) TRANSESOPHAGEAL ECHOCARDIOGRAM (TEE) (N/A)  1. CV-4 second pause when he converted from rate controlled afib to NSR. He is now on backup rate of 50. No further events. Off all drips. coox 63.5 2. Pulm-tolerating 2L [place] with excellent oxygenation. Continue to use incentive spirometer.  3. Renal-creatinine 0.91, Potassium 5.0. Continue IV lasix for fluid overload 4. H and H 8.5/25.8, expected acute blood loss anemia 5. Endo-blood glucose well controlled. Not a diabetic.   Plan: Discontinue central line. Ambulate in the halls. Continue to use incentive spirometer. Continue diuretics for fluid overload. Will plan to transfer to the stepdown unit if Dr. Prescott Gum agrees.   [name] 01/11/2020 9:56 AM Patient progressing after emergency CABG for critical left main stenosis Mixed venous saturation 63% off inotropes Brief episode of atrial fibrillation followed by sinus pause but overall maintaining sinus rhythm Transfer to stepdown with pacemaker connected  patient examined and medical record reviewed,agree with above note. [name]  III 01/11/2020

## 2020-01-12 ENCOUNTER — Inpatient Hospital Stay (HOSPITAL_COMMUNITY): Payer: Medicare PPO

## 2020-01-12 LAB — TYPE AND SCREEN
ABO/RH(D): O POS
Antibody Screen: NEGATIVE
Unit division: 0
Unit division: 0
Unit division: 0
Unit division: 0

## 2020-01-12 LAB — CULTURE, BLOOD (ROUTINE X 2)
Culture: NO GROWTH
Culture: NO GROWTH
Special Requests: ADEQUATE
Special Requests: ADEQUATE

## 2020-01-12 LAB — BPAM RBC
Blood Product Expiration Date: 202102152359
Blood Product Expiration Date: 202102152359
Blood Product Expiration Date: 202102162359
Blood Product Expiration Date: 202102162359
ISSUE DATE / TIME: 202101181338
ISSUE DATE / TIME: 202101181338
ISSUE DATE / TIME: 202101181338
ISSUE DATE / TIME: 202101181338
Unit Type and Rh: 5100
Unit Type and Rh: 5100
Unit Type and Rh: 5100
Unit Type and Rh: 5100

## 2020-01-12 LAB — GLUCOSE, CAPILLARY
Glucose-Capillary: 96 mg/dL (ref 70–99)
Glucose-Capillary: 111 mg/dL — ABNORMAL HIGH (ref 70–99)
Glucose-Capillary: 98 mg/dL (ref 70–99)
Glucose-Capillary: 109 mg/dL — ABNORMAL HIGH (ref 70–99)

## 2020-01-12 LAB — BASIC METABOLIC PANEL
Anion gap: 10 (ref 5–15)
BUN: 14 mg/dL (ref 8–23)
CO2: 23 mmol/L (ref 22–32)
Calcium: 7.8 mg/dL — ABNORMAL LOW (ref 8.9–10.3)
Chloride: 95 mmol/L — ABNORMAL LOW (ref 98–111)
Creatinine, Ser: 0.83 mg/dL (ref 0.61–1.24)
GFR calc Af Amer: >60 mL/min (ref >60)
GFR calc non Af Amer: >60 mL/min (ref >60)
Glucose, Bld: 119 mg/dL — ABNORMAL HIGH (ref 70–99)
Potassium: 3.9 mmol/L (ref 3.5–5.1)
Sodium: 128 mmol/L — ABNORMAL LOW (ref 135–145)

## 2020-01-12 LAB — CBC
HCT: 25.1 % — ABNORMAL LOW (ref 39.0–52.0)
Hemoglobin: 8.6 g/dL — ABNORMAL LOW (ref 13.0–17.0)
MCH: 30.3 pg (ref 26.0–34.0)
MCHC: 34.3 g/dL (ref 30.0–36.0)
MCV: 88.4 fL (ref 80.0–100.0)
Platelets: 235 10*3/uL (ref 150–400)
RBC: 2.84 MIL/uL — ABNORMAL LOW (ref 4.22–5.81)
RDW: 13.2 % (ref 11.5–15.5)
WBC: 11.2 10*3/uL — ABNORMAL HIGH (ref 4.0–10.5)
nRBC: 0 % (ref 0.0–0.2)

## 2020-01-12 LAB — ECHO INTRAOPERATIVE TEE
Height: 67 in
Weight: 2243.4 oz

## 2020-01-12 MED ORDER — TRAMADOL HCL 50 MG PO TABS: 50.0000 mg | ORAL_TABLET | ORAL | Status: DC | PRN

## 2020-01-12 NOTE — Progress Notes (Signed)
Transferred -in from [facility] by wheelchair awake and alert. . Preferred to sit in a chair.

## 2020-01-12 NOTE — Progress Notes (Signed)
Pt. is hallucinating  claiming  that it is raining hard outside , wind blowing hard , water dripping on the floor and people passing by are soaked and wet. Redirected.  Nurse tech to be in his room for close watch. Ambulated along the hallway with front wheel walker assisted by NT.

## 2020-01-12 NOTE — Progress Notes (Signed)
Report called to [place] on [facility].  Will transport to new room.

## 2020-01-12 NOTE — Progress Notes (Signed)
CARDIAC REHAB PHASE I   PRE:  Rate/Rhythm: 78 SR  BP:  Supine:   Sitting: 108/52  Standing:    SaO2: 97%RA  MODE:  Ambulation: 740 ft   POST:  Rate/Rhythm: 92 SR  BP:  Supine:   Sitting: 130/61  Standing:    SaO2: 96%RA 1300-1335 Pt walked 740 ft on RA with rolling walker and asst x 1. Tolerated well. Wife to check to see if walker at home. To bed with warm blanket and alarm on. Wife in room.     Graylon Good, RN BSN  01/12/2020 1:31 PM

## 2020-01-12 NOTE — Progress Notes (Addendum)
      [address]       [place] [address]             [phone]      4 Days Post-Op Procedure(s) (LRB): CORONARY ARTERY BYPASS GRAFTING (CABG) x 4  WITH ENDOSCOPIC HARVESTING OF BILATERAL GREATER SAPHENOUS VEINS (N/A) TRANSESOPHAGEAL ECHOCARDIOGRAM (TEE) (N/A) Subjective: Some confusions last night and seeing things that were not there. This morning asking for a shave and lotion for his legs.   Objective: Vital signs in last 24 hours: Temp:  [98.5 F (36.9 C)-99.2 F (37.3 C)] 98.5 F (36.9 C) (01/22 0700) Pulse Rate:  [76-85] 83 (01/22 0808) Cardiac Rhythm: (P) Normal sinus rhythm (01/22 0808) Resp:  [15-22] 15 (01/21 1200) BP: (109-145)/(52-62) 131/62 (01/22 0808) SpO2:  [90 %-99 %] 99 % (01/22 0808) Weight:  [66.2 kg] 66.2 kg (01/22 0500)     Intake/Output from previous day: 01/21 0701 - 01/22 0700 In: 460 [P.O.:460] Out: 2725 [Urine:2725] Intake/Output this shift: Total I/O In: 90 [P.O.:90] Out: -   General appearance: alert, cooperative and no distress Heart: regular rate and rhythm, S1, S2 normal, no murmur, click, rub or gallop Lungs: clear to auscultation bilaterally Abdomen: soft, non-tender; bowel sounds normal; no masses,  no organomegaly Extremities: extremities normal, atraumatic, no cyanosis or edema Wound: clean and dry  Lab Results: Recent Labs    01/11/20 0305 01/12/20 0137  WBC 11.8* 11.2*  HGB 8.5* 8.6*  HCT 25.8* 25.1*  PLT 175 235   BMET:  Recent Labs    01/11/20 0305 01/12/20 0137  NA 127* 128*  K 5.0 3.9  CL 94* 95*  CO2 24 23  GLUCOSE 125* 119*  BUN 15 14  CREATININE 0.91 0.83  CALCIUM 7.9* 7.8*    PT/INR: No results for input(s): LABPROT, INR in the last 72 hours. ABG    Component Value Date/Time   PHART 7.455 (H) 01/09/2020 1055   HCO3 21.8 01/09/2020 1055   TCO2 23 01/09/2020 1055   ACIDBASEDEF 1.0 01/09/2020 1055   O2SAT 63.5 01/11/2020 0306   CBG (last 3)  Recent Labs    01/11/20 1549  01/11/20 2136 01/12/20 0638  GLUCAP 105* 88 96    Assessment/Plan: S/P Procedure(s) (LRB): CORONARY ARTERY BYPASS GRAFTING (CABG) x 4  WITH ENDOSCOPIC HARVESTING OF BILATERAL GREATER SAPHENOUS VEINS (N/A) TRANSESOPHAGEAL ECHOCARDIOGRAM (TEE) (N/A)  1. CV-No further pauses over night but was in and out of brief episodes of afib. 2. Pulm-tolerating 2L [place] with excellent oxygenation. Continue to use incentive spirometer.  3. Renal-creatinine 0.83, Potassium 5.9. Continue IV lasix for fluid overload 4. H and H 8.6/25.1, expected acute blood loss anemia 5. Endo-blood glucose well controlled. Not a diabetic. 6. Confusion/halluncinations- Discontinue oxycodone and just use tramadol and tylenol for pain.   Plan: [place] appears clearer this morning. Still okay to transfer to stepdown. Rhythm has been stable. Continue to wean oxygen as tolerated. Ambulate in the halls TID. Hopefully home this weekend.    LOS: 4 days    [name] 01/12/2020  transferred to 2 C  DC EPWs 1-23 and home Sunday todays CXR with small L effusion-cont po lasix until at preop wt  patient examined and medical record reviewed,agree with above note. [name] III 01/12/2020

## 2020-01-13 LAB — GLUCOSE, CAPILLARY
Glucose-Capillary: 107 mg/dL — ABNORMAL HIGH (ref 70–99)
Glucose-Capillary: 149 mg/dL — ABNORMAL HIGH (ref 70–99)

## 2020-01-13 MED ORDER — LISINOPRIL 2.5 MG PO TABS
2.5000 mg | ORAL_TABLET | Freq: Every day | ORAL | Status: DC
  Administered 2020-01-13 – 2020-01-14 (×2): 2.5 mg via ORAL
  Filled 2020-01-13 (×2): qty 1

## 2020-01-13 MED ORDER — TRAMADOL HCL 50 MG PO TABS: 50.0000 mg | ORAL_TABLET | Freq: Two times a day (BID) | ORAL | Status: DC | PRN

## 2020-01-13 NOTE — Progress Notes (Addendum)
Pt thinking he's seeing the tree's blowing and hearing the wind blowing.  Redirected pt. It did sound like wind or air conditioning close to pt's window however there were no tree's to be seen.  Vs stable. Pt bed lowest position and bed alarm on.  Call bell in reach. Will monitor closely. Saunders Revel T

## 2020-01-13 NOTE — Progress Notes (Addendum)
[address]       [place] [address]             [phone]      5 Days Post-Op Procedure(s) (LRB): CORONARY ARTERY BYPASS GRAFTING (CABG) x 4  WITH ENDOSCOPIC HARVESTING OF BILATERAL GREATER SAPHENOUS VEINS (N/A) TRANSESOPHAGEAL ECHOCARDIOGRAM (TEE) (N/A) Subjective: Agitated and somewhat confused at times  Objective: Vital signs in last 24 hours: Temp:  [97.4 F (36.3 C)-98.6 F (37 C)] 97.4 F (36.3 C) (01/23 0806) Pulse Rate:  [78-93] 83 (01/23 0328) Cardiac Rhythm: Normal sinus rhythm;Bundle branch block (01/23 0700) Resp:  [16-22] 22 (01/23 0328) BP: (108-135)/(52-68) 134/68 (01/23 0328) SpO2:  [89 %-99 %] 97 % (01/23 0806) Weight:  [63.5 kg] 63.5 kg (01/23 0328)  Hemodynamic parameters for last 24 hours:    Intake/Output from previous day: 01/22 0701 - 01/23 0700 In: 450 [P.O.:450] Out: 400 [Urine:400] Intake/Output this shift: No intake/output data recorded.  General appearance: alert, cooperative and no distress Heart: regular rate and rhythm Lungs: clear to auscultation bilaterally Abdomen: benign Extremities: no edema Wound: incis healing well  Lab Results: Recent Labs    01/11/20 0305 01/12/20 0137  WBC 11.8* 11.2*  HGB 8.5* 8.6*  HCT 25.8* 25.1*  PLT 175 235   BMET:  Recent Labs    01/11/20 0305 01/12/20 0137  NA 127* 128*  K 5.0 3.9  CL 94* 95*  CO2 24 23  GLUCOSE 125* 119*  BUN 15 14  CREATININE 0.91 0.83  CALCIUM 7.9* 7.8*    PT/INR: No results for input(s): LABPROT, INR in the last 72 hours. ABG    Component Value Date/Time   PHART 7.455 (H) 01/09/2020 1055   HCO3 21.8 01/09/2020 1055   TCO2 23 01/09/2020 1055   ACIDBASEDEF 1.0 01/09/2020 1055   O2SAT 63.5 01/11/2020 0306   CBG (last 3)  Recent Labs    01/12/20 1607 01/12/20 2121 01/13/20 0600  GLUCAP 98 109* 107*    Meds Scheduled Meds: . acetaminophen  1,000 mg Oral Q6H   Or  . acetaminophen (TYLENOL) oral liquid 160 mg/5 mL  1,000  mg Per Tube Q6H  . aspirin EC  325 mg Oral Daily   Or  . aspirin  324 mg Per Tube Daily  . bisacodyl  10 mg Oral Daily   Or  . bisacodyl  10 mg Rectal Daily  . Chlorhexidine Gluconate Cloth  6 each Topical Daily  . [facility], Patient & Family Education   Does not apply Once  . docusate sodium  200 mg Oral Daily  . feeding supplement (ENSURE ENLIVE)  237 mL Oral BID BM  . ferrous Q000111Q C-folic acid  1 capsule Oral BID PC  . furosemide  40 mg Intravenous Daily  . insulin aspart  0-5 Units Subcutaneous QHS  . insulin aspart  0-9 Units Subcutaneous TID WC  . mouth rinse  15 mL Mouth Rinse BID  . metoprolol tartrate  12.5 mg Oral BID   Or  . metoprolol tartrate  12.5 mg Per Tube BID  . multivitamin with minerals  1 tablet Oral Daily  . pantoprazole  40 mg Oral Daily  . simethicone  80 mg Oral TID AC & HS  . sodium chloride flush  3 mL Intravenous Q12H  . sodium chloride flush  3 mL Intravenous Q12H   Continuous Infusions: . sodium chloride    . sodium chloride     PRN Meds:.sodium  chloride, sodium chloride, ondansetron (ZOFRAN) IV, sodium chloride flush, sodium chloride flush, traMADol  Xrays DG Chest 2 View  Result Date: 01/12/2020 CLINICAL DATA:  CABG. EXAM: CHEST - 2 VIEW COMPARISON:  01/11/2020.  CT 01/07/2020. 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 Heart size normal. No pulmonary venous congestion. Persistent but improved bilateral atelectatic changes/infiltrates. Small bilateral pleural effusions. No pneumothorax. IMPRESSION: 1. Interim removal of right IJ line. 2. Prior CABG. Heart size normal. No pulmonary venous congestion. 3. Persistent but improved bilateral atelectatic changes/infiltrates. Small bilateral pleural effusions. Electronically Signed   By: Marcello Moores  Register   On: 01/12/2020 06:26    Assessment/Plan: S/P Procedure(s) (LRB): CORONARY ARTERY BYPASS GRAFTING (CABG) x 4  WITH ENDOSCOPIC HARVESTING OF BILATERAL  GREATER SAPHENOUS VEINS (N/A) TRANSESOPHAGEAL ECHOCARDIOGRAM (TEE) (N/A)   1 doing well but some issues of confusion/delerium persist- will limit ultram to 50 q 12 prn- cont to re-orient as able by nursing staff- hopefully will continue to improve and resolve with time 2 hemodyn stable in sinus rhythm, BP Wery tolerate  low dose ACE-I, will order 3 sats good on RA 4 BS adeq controlled, will d/c SSI since not a diabetic 5 d/c epw's today  6 volume status improved- will d/c lasix 7 no new labs   LOS: 5 days    John Giovanni PA-C 01/13/2020 Pager [phone]  Patient seen and examined, agree with above Some confusion, labile emotionally Dc pacing wires  Remo Lipps C. Roxan Hockey, MD [facility] [phone]

## 2020-01-13 NOTE — Plan of Care (Signed)

## 2020-01-13 NOTE — Progress Notes (Signed)
CARDIAC REHAB PHASE I   Stopped by Pt room to see if SO was there to begin education. SO not present and spoke to RN. RN was unsure when Pt SO would be coming. Will check back within the hour.  Returned to room an hour later. SO was still not present. Left education materials with RN to give to SO.   Jeralyn Bennett BS, ACSM CEP 01/13/2020  11:10 AM

## 2020-01-13 NOTE — Progress Notes (Signed)
Post Cardiac Pacing wire pull

## 2020-01-13 NOTE — Progress Notes (Signed)
Pre Cardiac Pacing wire pulled

## 2020-01-14 ENCOUNTER — Other Ambulatory Visit: Payer: Self-pay | Admitting: Surgical

## 2020-01-14 LAB — CBC
HCT: 24.9 % — ABNORMAL LOW (ref 39.0–52.0)
Hemoglobin: 8.3 g/dL — ABNORMAL LOW (ref 13.0–17.0)
MCH: 29.9 pg (ref 26.0–34.0)
MCHC: 33.3 g/dL (ref 30.0–36.0)
MCV: 89.6 fL (ref 80.0–100.0)
Platelets: 305 10*3/uL (ref 150–400)
RBC: 2.78 MIL/uL — ABNORMAL LOW (ref 4.22–5.81)
RDW: 13.5 % (ref 11.5–15.5)
WBC: 7.9 10*3/uL (ref 4.0–10.5)
nRBC: 0 % (ref 0.0–0.2)

## 2020-01-14 LAB — BASIC METABOLIC PANEL
Anion gap: 9 (ref 5–15)
BUN: 9 mg/dL (ref 8–23)
CO2: 25 mmol/L (ref 22–32)
Calcium: 7.9 mg/dL — ABNORMAL LOW (ref 8.9–10.3)
Chloride: 102 mmol/L (ref 98–111)
Creatinine, Ser: 0.84 mg/dL (ref 0.61–1.24)
GFR calc Af Amer: >60 mL/min (ref >60)
GFR calc non Af Amer: >60 mL/min (ref >60)
Glucose, Bld: 116 mg/dL — ABNORMAL HIGH (ref 70–99)
Potassium: 3.9 mmol/L (ref 3.5–5.1)
Sodium: 136 mmol/L (ref 135–145)

## 2020-01-14 MED ORDER — FE FUMARATE-B12-VIT C-FA-IFC PO CAPS: 1.0000 | ORAL_CAPSULE | Freq: Two times a day (BID) | ORAL | 2 refills | Status: DC

## 2020-01-14 MED ORDER — TRAMADOL HCL 50 MG PO TABS: 50.0000 mg | ORAL_TABLET | Freq: Two times a day (BID) | ORAL | 0 refills | Status: AC | PRN

## 2020-01-14 MED ORDER — ASPIRIN 325 MG PO TBEC: 325.0000 mg | DELAYED_RELEASE_TABLET | Freq: Every day | ORAL | Status: DC

## 2020-01-14 MED ORDER — METOPROLOL TARTRATE 25 MG PO TABS: 12.5000 mg | ORAL_TABLET | Freq: Two times a day (BID) | ORAL | 1 refills | Status: DC

## 2020-01-14 MED ORDER — LISINOPRIL 2.5 MG PO TABS: 2.5000 mg | ORAL_TABLET | Freq: Every day | ORAL | 1 refills | Status: DC

## 2020-01-14 MED ORDER — ADULT MULTIVITAMIN W/MINERALS CH: 1.0000 | ORAL_TABLET | Freq: Every day | ORAL | Status: DC

## 2020-01-14 NOTE — Progress Notes (Signed)
[address]       [place] [address]             [phone]      6 Days Post-Op Procedure(s) (LRB): CORONARY ARTERY BYPASS GRAFTING (CABG) x 4  WITH ENDOSCOPIC HARVESTING OF BILATERAL GREATER SAPHENOUS VEINS (N/A) TRANSESOPHAGEAL ECHOCARDIOGRAM (TEE) (N/A) Subjective: Feeling better , delirium appears resolved  Objective: Vital signs in last 24 hours: Temp:  [97.2 F (36.2 C)-99 F (37.2 C)] 97.2 F (36.2 C) (01/24 0726) Pulse Rate:  [71-79] 79 (01/24 0726) Cardiac Rhythm: Normal sinus rhythm;Bundle branch block (01/24 0700) Resp:  [13-22] 19 (01/24 0726) BP: (108-139)/(50-63) 135/56 (01/24 0726) SpO2:  [92 %-97 %] 92 % (01/24 0726) Weight:  [63.4 kg] 63.4 kg (01/24 0135)  Hemodynamic parameters for last 24 hours:    Intake/Output from previous day: 01/23 0701 - 01/24 0700 In: 1443 [P.O.:1440; I.V.:3] Out: -  Intake/Output this shift: Total I/O In: 180 [P.O.:180] Out: -   General appearance: alert, cooperative and no distress Heart: regular rate and rhythm Lungs: min dim in bases Abdomen: benign Extremities: no edema Wound: incis healing well without signs of infection  Lab Results: Recent Labs    01/12/20 0137 01/14/20 0235  WBC 11.2* 7.9  HGB 8.6* 8.3*  HCT 25.1* 24.9*  PLT 235 305   BMET:  Recent Labs    01/12/20 0137 01/14/20 0235  NA 128* 136  K 3.9 3.9  CL 95* 102  CO2 23 25  GLUCOSE 119* 116*  BUN 14 9  CREATININE 0.83 0.84  CALCIUM 7.8* 7.9*    PT/INR: No results for input(s): LABPROT, INR in the last 72 hours. ABG    Component Value Date/Time   PHART 7.455 (H) 01/09/2020 1055   HCO3 21.8 01/09/2020 1055   TCO2 23 01/09/2020 1055   ACIDBASEDEF 1.0 01/09/2020 1055   O2SAT 63.5 01/11/2020 0306   CBG (last 3)  Recent Labs    01/12/20 2121 01/13/20 0600 01/13/20 1210  GLUCAP 109* 107* 149*    Meds Scheduled Meds: . aspirin EC  325 mg Oral Daily   Or  . aspirin  324 mg Per Tube Daily  . bisacodyl   10 mg Oral Daily   Or  . bisacodyl  10 mg Rectal Daily  . [place] [facility], Patient & Family Education   Does not apply Once  . docusate sodium  200 mg Oral Daily  . feeding supplement (ENSURE ENLIVE)  237 mL Oral BID BM  . ferrous Q000111Q C-folic acid  1 capsule Oral BID PC  . lisinopril  2.5 mg Oral Daily  . mouth rinse  15 mL Mouth Rinse BID  . metoprolol tartrate  12.5 mg Oral BID   Or  . metoprolol tartrate  12.5 mg Per Tube BID  . multivitamin with minerals  1 tablet Oral Daily  . pantoprazole  40 mg Oral Daily  . sodium chloride flush  3 mL Intravenous Q12H   Continuous Infusions: . sodium chloride    . sodium chloride     PRN Meds:.sodium chloride, sodium chloride, ondansetron (ZOFRAN) IV, sodium chloride flush, traMADol  Xrays No results found.  Assessment/Plan: S/P Procedure(s) (LRB): CORONARY ARTERY BYPASS GRAFTING (CABG) x 4  WITH ENDOSCOPIC HARVESTING OF BILATERAL GREATER SAPHENOUS VEINS (N/A) TRANSESOPHAGEAL ECHOCARDIOGRAM (TEE) (N/A)  1 conts to feel better, delirium resolved 2 hemodyn stable in sinus rhythm 3 sats good on RA 4 anemia is fairly stable, not in  transfusion threshold 5 renal fxn is stable, not volume overloaded 6 stable for discharge  LOS: 6 days    John Giovanni [facility] 01/14/2020 Pager [phone]

## 2020-01-14 NOTE — Plan of Care (Signed)

## 2020-01-14 NOTE — Plan of Care (Signed)
  Problem: Education: Goal: Will demonstrate proper wound care and an understanding of methods to prevent future damage 01/14/2020 1315 by Shanon Ace, RN Outcome: Adequate for Discharge 01/14/2020 0727 by Shanon Ace, RN Outcome: Progressing Goal: Knowledge of disease or condition will improve 01/14/2020 1315 by Shanon Ace, RN Outcome: Adequate for Discharge 01/14/2020 0727 by Shanon Ace, RN Outcome: Progressing Goal: Knowledge of the prescribed therapeutic regimen will improve 01/14/2020 1315 by Shanon Ace, RN Outcome: Adequate for Discharge 01/14/2020 0727 by Shanon Ace, RN Outcome: Progressing Goal: Individualized Educational Video(s) 01/14/2020 1315 by Shanon Ace, RN Outcome: Adequate for Discharge 01/14/2020 0727 by Shanon Ace, RN Outcome: Progressing   Problem: Activity: Goal: Risk for activity intolerance will decrease 01/14/2020 1315 by Shanon Ace, RN Outcome: Adequate for Discharge 01/14/2020 0727 by Shanon Ace, RN Outcome: Progressing   Problem: Cardiac: Goal: Will achieve and/or maintain hemodynamic stability 01/14/2020 1315 by Shanon Ace, RN Outcome: Adequate for Discharge 01/14/2020 0727 by Shanon Ace, RN Outcome: Progressing   Problem: Clinical Measurements: Goal: Postoperative complications will be avoided or minimized 01/14/2020 1315 by Shanon Ace, RN Outcome: Adequate for Discharge 01/14/2020 0727 by Shanon Ace, RN Outcome: Progressing   Problem: Respiratory: Goal: Respiratory status will improve 01/14/2020 1315 by Shanon Ace, RN Outcome: Adequate for Discharge 01/14/2020 0727 by Shanon Ace, RN Outcome: Progressing   Problem: Skin Integrity: Goal: Wound healing without signs and symptoms of infection 01/14/2020 1315 by Shanon Ace, RN Outcome: Adequate for Discharge 01/14/2020 0727 by Shanon Ace, RN Outcome: Progressing Goal: Risk for impaired skin integrity will decrease 01/14/2020 1315 by Shanon Ace,  RN Outcome: Adequate for Discharge 01/14/2020 0727 by Shanon Ace, RN Outcome: Progressing   Problem: Urinary Elimination: Goal: Ability to achieve and maintain adequate renal perfusion and functioning will improve 01/14/2020 1315 by Shanon Ace, RN Outcome: Adequate for Discharge 01/14/2020 0727 by Shanon Ace, RN Outcome: Progressing

## 2020-01-15 ENCOUNTER — Telehealth: Payer: Self-pay

## 2020-01-15 ENCOUNTER — Telehealth (HOSPITAL_COMMUNITY): Payer: Self-pay | Admitting: *Deleted

## 2020-01-15 ENCOUNTER — Other Ambulatory Visit: Payer: Self-pay | Admitting: *Deleted

## 2020-01-15 NOTE — Telephone Encounter (Signed)
Since he had CABG and should have cardiology and surgeon follow up, it is okay to defer a visit with me for now

## 2020-01-15 NOTE — Telephone Encounter (Signed)
Called and spoke to pt significant other regarding referral received for Outpatient Cardiac rehab.  Noted that [place] in [name].  Asked if pt had a preference, indicated that [facility] is only a mile away from their home. Will route the referral for Cardiac rehab and mail brochure to mailing address on file. Cherre Huger, BSN Cardiac and Training and development officer

## 2020-01-15 NOTE — Telephone Encounter (Signed)
Transition Care Management Follow-up Telephone Call  Date of discharge and from where: 01/14/2020, [name]   How have you been since you were released from the hospital? Spoke with [name] (wife) and she states that patient is doing very good. Patient is getting around on his own very well.   Any questions or concerns? No   Items Reviewed:  Did the pt receive and understand the discharge instructions provided? Yes   Medications obtained and verified? Yes   Any new allergies since your discharge? No   Dietary orders reviewed? Yes  Do you have support at home? Yes   Functional Questionnaire: (I = Independent and D = Dependent) ADLs: I  Bathing/Dressing- I  Meal Prep- I  Eating- I  Maintaining continence- I  Transferring/Ambulation- I  Managing Meds- I  Follow up appointments reviewed:   PCP Hospital f/u appt confirmed? No, Wife states that after patient finishes following up with cardiology they will see if it is necessary to schedule with Dr. Silvio Pate at that point.  [facility] f/u appt confirmed? Yes  Scheduled to see cardiology.  Are transportation arrangements needed? No   If their condition worsens, is the pt aware to call PCP or go to the Emergency Dept.? Yes  Was the patient provided with contact information for the PCP's office or ED? Yes  Was to pt encouraged to call back with questions or concerns? Yes

## 2020-01-25 ENCOUNTER — Other Ambulatory Visit: Payer: Self-pay

## 2020-01-25 ENCOUNTER — Ambulatory Visit (INDEPENDENT_AMBULATORY_CARE_PROVIDER_SITE_OTHER): Payer: Medicare PPO | Admitting: Nurse Practitioner

## 2020-01-25 ENCOUNTER — Encounter: Payer: Self-pay | Admitting: Nurse Practitioner

## 2020-01-25 VITALS — BP 140/62 | HR 76 | Ht 67.0 in | Wt 139.5 lb

## 2020-01-25 DIAGNOSIS — R0602 Shortness of breath: Secondary | ICD-10-CM | POA: Diagnosis not present

## 2020-01-25 DIAGNOSIS — E785 Hyperlipidemia, unspecified: Secondary | ICD-10-CM | POA: Diagnosis not present

## 2020-01-25 DIAGNOSIS — I2510 Atherosclerotic heart disease of native coronary artery without angina pectoris: Secondary | ICD-10-CM | POA: Diagnosis not present

## 2020-01-25 DIAGNOSIS — I10 Essential (primary) hypertension: Secondary | ICD-10-CM | POA: Diagnosis not present

## 2020-01-25 DIAGNOSIS — I214 Non-ST elevation (NSTEMI) myocardial infarction: Secondary | ICD-10-CM

## 2020-01-25 MED ORDER — METOPROLOL SUCCINATE ER 25 MG PO TB24: 25.0000 mg | ORAL_TABLET | Freq: Every day | ORAL | 3 refills | Status: DC

## 2020-01-25 MED ORDER — EZETIMIBE 10 MG PO TABS: 10.0000 mg | ORAL_TABLET | Freq: Every day | ORAL | 3 refills | Status: DC

## 2020-01-25 NOTE — Patient Instructions (Signed)
Medication Instructions:  1- STOP Lopressor 2- START Toprol XL Take 1 tablet (25 mg total) by mouth daily 3- START Zetia Take 1 tablet (10 mg total) by mouth daily *If you need a refill on your cardiac medications before your next appointment, please call your pharmacy*  Lab Work: 1- Your physician recommends that you have lab work today(CBC, BMET) 2- Your physician recommends that you return for lab work in: 6 weeks at [facility]. You will need to be fasting.  No appt is needed. Hours are M-F 7AM- 6 PM.  If you have labs (blood work) drawn today and your tests are completely normal, you will receive your results only by: Marland Kitchen MyChart Message (if you have MyChart) OR . A paper copy in the mail If you have any lab test that is abnormal or we need to change your treatment, we will call you to review the results.  Testing/Procedures: None ordered   Follow-Up: At [facility], you and your health needs are our priority.  As part of our continuing mission to provide you with exceptional heart care, we have created designated Provider Care Teams.  These Care Teams include your primary Cardiologist (physician) and Advanced Practice Providers (APPs -  Physician Assistants and Nurse Practitioners) who all work together to provide you with the care you need, when you need it.  Your next appointment:   1 month(s)  The format for your next appointment:   In Person  Provider:    You Ebert see Dr. Garen Lah or Murray Hodgkins, NP.

## 2020-01-25 NOTE — Progress Notes (Addendum)
Office Visit    Patient Name: [name] Date of Encounter: 01/25/2020  Primary Care Provider:  Venia Carbon, MD Primary Cardiologist:  Kate Sable, MD  Chief Complaint    79 year old male with a history of hypertension, hyperlipidemia, arthritis, and irritable bowel syndrome, who presents for follow-up after recent admission for non-STEMI, finding of severe left main disease, and CABG x4.  Past Medical History    Past Medical History:  Diagnosis Date   Arthritis    Bone spur 2010   right shoulder   CAD (coronary artery disease)    a. 12/2019 NSTEMI/Cath: LM 99ost/m, LAD 40, LCX nl, OM1 80, RCA 70ost/p, 99p, 30d, EF 45-50%; b. 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   Hyperlipidemia    Hypertension    IBS (irritable bowel syndrome)    Past Surgical History:  Procedure Laterality Date   BACK SURGERY     Lumbar   BUNIONECTOMY     CORONARY ARTERY BYPASS GRAFT N/A 01/08/2020   Procedure: CORONARY ARTERY BYPASS GRAFTING (CABG) x 4  WITH ENDOSCOPIC HARVESTING OF BILATERAL GREATER SAPHENOUS VEINS;  Surgeon: Ivin Poot, MD;  Location: [place];  Service: Open Heart Surgery;  Laterality: N/A;   HEMORRHOID SURGERY  2004   INGUINAL HERNIA REPAIR  02/11   LEFT HEART CATH AND CORONARY ANGIOGRAPHY N/A 01/08/2020   Procedure: LEFT HEART CATH AND CORONARY ANGIOGRAPHY;  Surgeon: Wellington Hampshire, MD;  Location: [place] [facility];  Service: Cardiovascular;  Laterality: N/A;   ROTATOR CUFF REPAIR  07/11   left/ bone spur Dr.Blackman   SHOULDER ARTHROSCOPY WITH OPEN ROTATOR CUFF REPAIR Right 05/26/2018   Procedure: SHOULDER ARTHROSCOPY WITH OPEN ROTATOR CUFF REPAIR;  Surgeon: Corky Mull, MD;  Location: [facility];  Service: Orthopedics;  Laterality: Right;  debridement, decompression   SKIN CANCER EXCISION  2003-2005   right shoulder   TEE WITHOUT CARDIOVERSION N/A 01/08/2020   Procedure: TRANSESOPHAGEAL ECHOCARDIOGRAM (TEE);  Surgeon: Prescott Gum,  Collier Salina, MD;  Location: [place];  Service: Open Heart Surgery;  Laterality: N/A;    Allergies  Allergies  Allergen Reactions   Crestor [Rosuvastatin] Other (See Comments)    Severe myalgias   Penicillins Rash   Peanut-Containing Drug Products    Atorvastatin Other (See Comments)    ache   Simvastatin Other (See Comments)    aching    History of Present Illness    79 year old male with the above past medical history including hypertension, hyperlipidemia, high potency statin intolerance, arthritis, and irritable bowel syndrome.  He was admitted to [facility] on January 07, 2020 in the setting of chest heaviness and dyspnea.  He ruled in for non-STEMI, eventually peaking his high-sensitivity troponin at 5807.  He underwent diagnostic catheterization revealing severe left main, obtuse marginal, and RCA disease.  He was transferred to [facility] for emergent coronary artery bypass grafting, which was successfully performed on January 18 (CABG x4).  Perioperative TEE showed an EF of 45-50%.  Postoperative course was relatively uncomplicated.  He did have mild anemia with drop in hemoglobin to 7.8 though did not require transfusion.  He was subsequently discharged home on January 24.  Since then, he has done quite well.  He has not had much in the way of chest wall pain and denies any angina or dyspnea.  His left medial thigh incision has been painful since postoperative day 1 and is slowly getting better.  He reports overall compliance with medications but notes  that he frequently forgets to take his evening dose of metoprolol.  When that occurs, he sometimes notes what he describes as hard heartbeats that can occur for a few seconds and resolve spontaneously.  Interestingly, he says that the rate is not fast just that he notes that the beats are hard.  There are no other associated symptoms.  He denies PND, orthopnea, dizziness, syncope, edema, or early satiety.  Home Medications    Prior  to Admission medications   Medication Sig Start Date End Date Taking? Authorizing Provider  aspirin EC 325 MG EC tablet Take 1 tablet (325 mg total) by mouth daily. 01/14/20  Yes Gold, Wilder Glade, PA-C  ferrous Q000111Q C-folic acid (TRINSICON / FOLTRIN) capsule Take 1 capsule by mouth 2 (two) times daily after a meal. Patient taking differently: Take 1 capsule by mouth every other day.  01/14/20  Yes Gold, Wilder Glade, PA-C  hyoscyamine (ANASPAZ) 0.125 MG TBDP disintergrating tablet Place 0.125 mg under the tongue 2 (two) times daily as needed for bladder spasms.   Yes [provider]  lisinopril (ZESTRIL) 2.5 MG tablet Take 1 tablet (2.5 mg total) by mouth daily. 01/14/20  Yes Gold, Wayne E, PA-C  lovastatin (MEVACOR) 20 MG tablet Take 20 mg by mouth daily.    Yes [provider]  metoprolol tartrate (LOPRESSOR) 25 MG tablet Take 0.5 tablets (12.5 mg total) by mouth 2 (two) times daily. 01/14/20  Yes Gold, Wilder Glade, PA-C  Multiple Vitamin (MULTIVITAMIN WITH MINERALS) TABS tablet Take 1 tablet by mouth daily. 01/14/20  Yes Gold, Wayne E, PA-C  Psyllium (METAMUCIL PO) Take 1 Dose by mouth at bedtime. Mix with water   Yes [provider]    Review of Systems    Occasional palpitations described as heart heartbeats.  He denies any tachycardia.  No chest pain, dyspnea, palpitations, PND, orthopnea, dizziness, syncope, edema, or early satiety.  Left medial thigh incision has been uncomfortable but slowly improving since surgery.  All other systems reviewed and are otherwise negative except as noted above.  Physical Exam    VS:  BP 140/62 (BP Location: Left Arm, Patient Position: Sitting, Cuff Size: Normal)    Pulse 76    Ht 5\' 7"  (1.702 m)    Wt 139 lb 8 oz (63.3 kg)    SpO2 98%    BMI 21.85 kg/m  , BMI Body mass index is 21.85 kg/m. GEN: Thin, in no acute distress. HEENT: normal. Neck: Supple, no JVD, carotid bruits, or masses. Cardiac: RRR, no murmurs, rubs, or  gallops. No clubbing, cyanosis, edema.  Radials/DP/PT 2+ and equal bilaterally.  Midline sternal incision is healing well without erythema or discharge.  Left medial thigh incision site is healing well without erythema or discharge.  The area surrounding the site is mildly ecchymotic and tender to touch. Respiratory:  Respirations regular and unlabored, clear to auscultation bilaterally. GI: Soft, nontender, nondistended, BS + x 4.  Abdominal incision sites healing well with Steri-Strips in place. MS: no deformity or atrophy. Skin: warm and dry, no rash. Neuro:  Strength and sensation are intact. Psych: Normal affect.  Accessory Clinical Findings    ECG personally reviewed by me today -sinus rhythm, 76, atrial bigeminy, septal infarct, anterior T wave inversion with lateral T flattening - no acute changes.  Lab Results  Component Value Date   WBC 7.9 01/14/2020   HGB 8.3 (L) 01/14/2020   HCT 24.9 (L) 01/14/2020   MCV 89.6 01/14/2020   PLT  305 01/14/2020   Lab Results  Component Value Date   CREATININE 0.84 01/14/2020   BUN 9 01/14/2020   NA 136 01/14/2020   K 3.9 01/14/2020   CL 102 01/14/2020   CO2 25 01/14/2020   Lab Results  Component Value Date   ALT 37 01/11/2020   AST 36 01/11/2020   ALKPHOS 71 01/11/2020   BILITOT 1.4 (H) 01/11/2020   Lab Results  Component Value Date   CHOL 152 01/08/2020   HDL 34 (L) 01/08/2020   LDLCALC 105 (H) 01/08/2020   LDLDIRECT 188.8 04/05/2012   TRIG 64 01/08/2020   CHOLHDL 4.5 01/08/2020    Lab Results  Component Value Date   HGBA1C 5.4 01/08/2020    Assessment & Plan    1.  Non-STEMI, subsequent episode of care/CAD: Patient recently admitted with chest pain and dyspnea and found to have a non-STEMI, peaking his high-sensitivity troponin over 5000.  Catheterization revealed severe left main, obtuse marginal, and RCA disease.  He underwent urgent CABG x4.  EF 45 to 50% by intraoperative transesophageal echocardiogram.   Postoperative course relatively uncomplicated.  He has been doing well at home, he has been walking a fair amount and does not experience chest wall pain, angina, or dyspnea.  Surgical wounds are healing well.  He remains on aspirin, beta-blocker, ACE inhibitor, and statin therapy.  In the setting of non-STEMI, we did discuss potentially switching him from full dose aspirin to aspirin 81 + Plavix however, he prefers to remain on aspirin alone.  He is on lovastatin therapy as this is historically the only statin he is ever tolerated.  He has never been on Zetia.  His most recent LDL was 105.  He is willing to accept a prescription for Zetia 10 mg daily.  He is interested in cardiac rehabilitation and does plan to enroll.  He believes that his wife was contacted by the [place] rehab team already.  He is frustrated in general that he ended up having a heart attack and needing bypass surgery as he says he always trying to maintain a healthy weight and is always had a healthy diet.  We discussed how his healthy lifestyle previously likely delayed his development of coronary artery disease and will make rehabilitation easier, as he is already exhibiting.  2.  Essential hypertension: Stable on beta-blocker and ACE inhibitor therapy.  3.  Hyperlipidemia: Patient is intolerant to multiple high potency statins and is on lovastatin 20 mg daily.  LDL was 105 in January.  With a goal LDL of less than 70, he is willing to accept a prescription for Zetia 10 mg daily.  Plan to follow-up lipids and LFTs in approximately 6 weeks.  Huish need to consider PCSK9 inhibitor if not at goal or does not tolerate Zetia.  4.  Palpitations: Patient says that he has sometimes noted palpitations described as heart heartbeats, occurring randomly at night, lasting a few seconds, and resolving spontaneously.  There are no associated symptoms.  He denies tachycardia.  He says he thinks this is more likely to happen when he forgets to take his  p.m. metoprolol dose.  He prefers not to have to take BID metoprolol, as he is prone to forgetting.  In that setting, I will send in a prescription for Toprol-XL 25 mg daily.  I did offer to place an event monitor to assess for arrhythmias however, he wished to avoid this.  5.  Ischemic cardiomyopathy: EF 45 to 50% by intraoperative TEE.  Euvolemic on examination.  He is on beta-blocker and ACE inhibitor.  As above, switching to Toprol-XL given trouble with compliance with his p.m. dose of metoprolol.  6.  Postoperative anemia: Follow-up CBC today.  7.  Disposition: Follow-up lab work today.  Plan to have him follow-up in clinic in about a month.  Follow-up lipids and LFTs in approximately 6 weeks.   Murray Hodgkins, NP 01/25/2020, 12:52 PM

## 2020-01-26 ENCOUNTER — Telehealth: Payer: Self-pay

## 2020-01-26 DIAGNOSIS — I2510 Atherosclerotic heart disease of native coronary artery without angina pectoris: Secondary | ICD-10-CM

## 2020-01-26 LAB — BASIC METABOLIC PANEL
BUN/Creatinine Ratio: 10 (ref 10–24)
BUN: 9 mg/dL (ref 8–27)
CO2: 21 mmol/L (ref 20–29)
Calcium: 8.9 mg/dL (ref 8.6–10.2)
Chloride: 102 mmol/L (ref 96–106)
Creatinine, Ser: 0.89 mg/dL (ref 0.76–1.27)
GFR calc Af Amer: 95 mL/min/{1.73_m2} (ref >59)
GFR calc non Af Amer: 82 mL/min/{1.73_m2} (ref >59)
Glucose: 91 mg/dL (ref 65–99)
Potassium: 5.3 mmol/L — ABNORMAL HIGH (ref 3.5–5.2)
Sodium: 137 mmol/L (ref 134–144)

## 2020-01-26 LAB — CBC
Hematocrit: 33.7 % — ABNORMAL LOW (ref 37.5–51.0)
Hemoglobin: 10.8 g/dL — ABNORMAL LOW (ref 13.0–17.7)
MCH: 29 pg (ref 26.6–33.0)
MCHC: 32 g/dL (ref 31.5–35.7)
MCV: 91 fL (ref 79–97)
Platelets: 591 10*3/uL — ABNORMAL HIGH (ref 150–450)
RBC: 3.72 x10E6/uL — ABNORMAL LOW (ref 4.14–5.80)
RDW: 12.7 % (ref 11.6–15.4)
WBC: 8.6 10*3/uL (ref 3.4–10.8)

## 2020-01-26 NOTE — Telephone Encounter (Signed)
Call to patient to discuss lab results and POC. Spoke to patient and wife. No med changes, [facility] to be [facility]. Patient agreed to [place].  Advised pt to call for any further questions or concerns.

## 2020-01-26 NOTE — Telephone Encounter (Signed)
Call attempted, no answer, no vm.

## 2020-01-26 NOTE — Telephone Encounter (Signed)
-----   Message from Theora Gianotti, NP sent at 01/26/2020  7:20 AM EST ----- Blood count improved since hospital discharge.  Renal fxn wnl. Potassium is mildly elevated.  Pls arrange for f/u potassium  level in a week to reassess.

## 2020-01-28 ENCOUNTER — Ambulatory Visit: Payer: Medicare PPO | Attending: Internal Medicine

## 2020-01-28 DIAGNOSIS — Z23 Encounter for immunization: Secondary | ICD-10-CM | POA: Insufficient documentation

## 2020-01-28 NOTE — Progress Notes (Signed)
   Covid-19 Vaccination Clinic  Name:  [name]    MRN: [id number] DOB: 1941-01-07  01/28/2020  [name] was observed post Covid-19 immunization for 15 minutes without incidence. He was provided with Vaccine Information Sheet and instruction to access the V-Safe system.   [name] was instructed to call 911 with any severe reactions post vaccine: Marland Kitchen Difficulty breathing  . Swelling of your face and throat  . A fast heartbeat  . A bad rash all over your body  . Dizziness and weakness    Immunizations Administered    Name Date Dose VIS Date Route   Pfizer COVID-19 Vaccine 01/28/2020 10:48 AM 0.3 mL 12/01/2019 Intramuscular   Manufacturer: [place]   Lot: [id number]   [place]: [id number]

## 2020-01-30 ENCOUNTER — Encounter: Payer: Self-pay | Admitting: Emergency Medicine

## 2020-01-30 ENCOUNTER — Other Ambulatory Visit: Payer: Self-pay

## 2020-01-30 ENCOUNTER — Telehealth: Payer: Self-pay | Admitting: Physician Assistant

## 2020-01-30 ENCOUNTER — Emergency Department: Payer: Medicare PPO

## 2020-01-30 ENCOUNTER — Inpatient Hospital Stay
Admission: EM | Admit: 2020-01-30 | Discharge: 2020-02-01 | DRG: 309 | Disposition: A | Discharge status: Home / Self Care | Payer: Medicare PPO | Attending: Internal Medicine | Admitting: Internal Medicine

## 2020-01-30 DIAGNOSIS — I4892 Unspecified atrial flutter: Principal | ICD-10-CM | POA: Diagnosis present

## 2020-01-30 DIAGNOSIS — I10 Essential (primary) hypertension: Secondary | ICD-10-CM | POA: Diagnosis present

## 2020-01-30 DIAGNOSIS — R0789 Other chest pain: Secondary | ICD-10-CM | POA: Diagnosis not present

## 2020-01-30 DIAGNOSIS — I255 Ischemic cardiomyopathy: Secondary | ICD-10-CM | POA: Diagnosis present

## 2020-01-30 DIAGNOSIS — R079 Chest pain, unspecified: Secondary | ICD-10-CM

## 2020-01-30 DIAGNOSIS — E785 Hyperlipidemia, unspecified: Secondary | ICD-10-CM | POA: Diagnosis present

## 2020-01-30 DIAGNOSIS — Z8249 Family history of ischemic heart disease and other diseases of the circulatory system: Secondary | ICD-10-CM

## 2020-01-30 DIAGNOSIS — I110 Hypertensive heart disease with heart failure: Secondary | ICD-10-CM | POA: Diagnosis present

## 2020-01-30 DIAGNOSIS — Z7982 Long term (current) use of aspirin: Secondary | ICD-10-CM

## 2020-01-30 DIAGNOSIS — I2510 Atherosclerotic heart disease of native coronary artery without angina pectoris: Secondary | ICD-10-CM | POA: Diagnosis present

## 2020-01-30 DIAGNOSIS — Z85828 Personal history of other malignant neoplasm of skin: Secondary | ICD-10-CM

## 2020-01-30 DIAGNOSIS — Z599 Problem related to housing and economic circumstances, unspecified: Secondary | ICD-10-CM

## 2020-01-30 DIAGNOSIS — K589 Irritable bowel syndrome without diarrhea: Secondary | ICD-10-CM | POA: Diagnosis present

## 2020-01-30 DIAGNOSIS — Z91010 Allergy to peanuts: Secondary | ICD-10-CM

## 2020-01-30 DIAGNOSIS — I5020 Unspecified systolic (congestive) heart failure: Secondary | ICD-10-CM | POA: Diagnosis present

## 2020-01-30 DIAGNOSIS — I252 Old myocardial infarction: Secondary | ICD-10-CM

## 2020-01-30 DIAGNOSIS — M1990 Unspecified osteoarthritis, unspecified site: Secondary | ICD-10-CM | POA: Diagnosis present

## 2020-01-30 DIAGNOSIS — I441 Atrioventricular block, second degree: Secondary | ICD-10-CM | POA: Diagnosis present

## 2020-01-30 DIAGNOSIS — D649 Anemia, unspecified: Secondary | ICD-10-CM | POA: Diagnosis present

## 2020-01-30 DIAGNOSIS — Z951 Presence of aortocoronary bypass graft: Secondary | ICD-10-CM

## 2020-01-30 DIAGNOSIS — Z20822 Contact with and (suspected) exposure to covid-19: Secondary | ICD-10-CM | POA: Diagnosis present

## 2020-01-30 DIAGNOSIS — Z888 Allergy status to other drugs, medicaments and biological substances status: Secondary | ICD-10-CM

## 2020-01-30 DIAGNOSIS — Z880 Allergy status to penicillin: Secondary | ICD-10-CM

## 2020-01-30 LAB — COMPREHENSIVE METABOLIC PANEL
ALT: 19 U/L (ref 0–44)
AST: 23 U/L (ref 15–41)
Albumin: 3.9 g/dL (ref 3.5–5.0)
Alkaline Phosphatase: 158 U/L — ABNORMAL HIGH (ref 38–126)
Anion gap: 11 (ref 5–15)
BUN: 11 mg/dL (ref 8–23)
CO2: 23 mmol/L (ref 22–32)
Calcium: 8.9 mg/dL (ref 8.9–10.3)
Chloride: 98 mmol/L (ref 98–111)
Creatinine, Ser: 0.76 mg/dL (ref 0.61–1.24)
GFR calc Af Amer: >60 mL/min (ref >60)
GFR calc non Af Amer: >60 mL/min (ref >60)
Glucose, Bld: 145 mg/dL — ABNORMAL HIGH (ref 70–99)
Potassium: 4.4 mmol/L (ref 3.5–5.1)
Sodium: 132 mmol/L — ABNORMAL LOW (ref 135–145)
Total Bilirubin: 0.8 mg/dL (ref 0.3–1.2)
Total Protein: 7.1 g/dL (ref 6.5–8.1)

## 2020-01-30 LAB — CBC WITH DIFFERENTIAL/PLATELET
Abs Immature Granulocytes: 0.03 10*3/uL (ref 0.00–0.07)
Basophils Absolute: 0 10*3/uL (ref 0.0–0.1)
Basophils Relative: 0 %
Eosinophils Absolute: 0.3 10*3/uL (ref 0.0–0.5)
Eosinophils Relative: 4 %
HCT: 33.1 % — ABNORMAL LOW (ref 39.0–52.0)
Hemoglobin: 10.8 g/dL — ABNORMAL LOW (ref 13.0–17.0)
Immature Granulocytes: 0 %
Lymphocytes Relative: 32 %
Lymphs Abs: 2.4 10*3/uL (ref 0.7–4.0)
MCH: 28.8 pg (ref 26.0–34.0)
MCHC: 32.6 g/dL (ref 30.0–36.0)
MCV: 88.3 fL (ref 80.0–100.0)
Monocytes Absolute: 0.8 10*3/uL (ref 0.1–1.0)
Monocytes Relative: 10 %
Neutro Abs: 4 10*3/uL (ref 1.7–7.7)
Neutrophils Relative %: 54 %
Platelets: 388 10*3/uL (ref 150–400)
RBC: 3.75 MIL/uL — ABNORMAL LOW (ref 4.22–5.81)
RDW: 12.8 % (ref 11.5–15.5)
WBC: 7.6 10*3/uL (ref 4.0–10.5)
nRBC: 0 % (ref 0.0–0.2)

## 2020-01-30 LAB — MAGNESIUM: Magnesium: 2.1 mg/dL (ref 1.7–2.4)

## 2020-01-30 LAB — TROPONIN I (HIGH SENSITIVITY)
Troponin I (High Sensitivity): 31 ng/L — ABNORMAL HIGH (ref <18)
Troponin I (High Sensitivity): 32 ng/L — ABNORMAL HIGH (ref <18)

## 2020-01-30 LAB — BRAIN NATRIURETIC PEPTIDE: B Natriuretic Peptide: 458 pg/mL — ABNORMAL HIGH (ref 0.0–100.0)

## 2020-01-30 MED ORDER — METOPROLOL TARTRATE 5 MG/5ML IV SOLN: 5.0000 mg | INTRAVENOUS | Status: DC | PRN

## 2020-01-30 MED ORDER — ACETAMINOPHEN 325 MG PO TABS: 650.0000 mg | ORAL_TABLET | ORAL | Status: DC | PRN

## 2020-01-30 MED ORDER — ENOXAPARIN SODIUM 40 MG/0.4ML ~~LOC~~ SOLN
40.0000 mg | SUBCUTANEOUS | Status: DC
  Administered 2020-01-30: 23:00:00 40 mg via SUBCUTANEOUS
  Filled 2020-01-30: qty 0.4

## 2020-01-30 MED ORDER — DILTIAZEM HCL 25 MG/5ML IV SOLN
INTRAVENOUS | Status: AC
  Filled 2020-01-30: qty 10

## 2020-01-30 MED ORDER — DILTIAZEM HCL 25 MG/5ML IV SOLN
10.0000 mg | Freq: Once | INTRAVENOUS | Status: AC
  Administered 2020-01-30: 20:00:00 10 mg via INTRAVENOUS

## 2020-01-30 MED ORDER — ONDANSETRON HCL 4 MG/2ML IJ SOLN: 4.0000 mg | Freq: Four times a day (QID) | INTRAMUSCULAR | Status: DC | PRN

## 2020-01-30 MED ORDER — SODIUM CHLORIDE 0.9% FLUSH
10.0000 mL | Freq: Two times a day (BID) | INTRAVENOUS | Status: DC
  Administered 2020-01-30 – 2020-02-01 (×4): 10 mL via INTRAVENOUS

## 2020-01-30 MED ORDER — METOPROLOL SUCCINATE ER 50 MG PO TB24
25.0000 mg | ORAL_TABLET | Freq: Every day | ORAL | Status: DC
  Administered 2020-01-31: 08:00:00 25 mg via ORAL
  Filled 2020-01-30: qty 1

## 2020-01-30 MED ORDER — LORAZEPAM 2 MG/ML IJ SOLN
0.5000 mg | Freq: Once | INTRAMUSCULAR | Status: AC
  Administered 2020-01-30: 0.5 mg via INTRAVENOUS
  Filled 2020-01-30: qty 1

## 2020-01-30 MED ORDER — EZETIMIBE 10 MG PO TABS
10.0000 mg | ORAL_TABLET | Freq: Every day | ORAL | Status: DC
  Administered 2020-01-31 – 2020-02-01 (×2): 10 mg via ORAL
  Filled 2020-01-30 (×4): qty 1

## 2020-01-30 MED ORDER — ASPIRIN EC 325 MG PO TBEC
325.0000 mg | DELAYED_RELEASE_TABLET | Freq: Every day | ORAL | Status: DC
  Administered 2020-01-31: 325 mg via ORAL
  Filled 2020-01-30: qty 1

## 2020-01-30 MED ORDER — NON FORMULARY: 20.0000 mg | Freq: Every day | Status: DC

## 2020-01-30 MED ORDER — LISINOPRIL 5 MG PO TABS
2.5000 mg | ORAL_TABLET | Freq: Every day | ORAL | Status: DC
  Administered 2020-01-31: 2.5 mg via ORAL
  Filled 2020-01-30: qty 1

## 2020-01-30 MED ORDER — PRAVASTATIN SODIUM 20 MG PO TABS
20.0000 mg | ORAL_TABLET | Freq: Every day | ORAL | Status: DC
  Administered 2020-01-31: 18:00:00 20 mg via ORAL
  Filled 2020-01-30: qty 1

## 2020-01-30 MED ORDER — DILTIAZEM LOAD VIA INFUSION: 10.0000 mg | Freq: Once | INTRAVENOUS | Status: DC

## 2020-01-30 NOTE — H&P (Signed)
History and Physical    [name] [id number] DOB: October 25, 1941 DOA: 01/30/2020  PCP: Venia Carbon, MD  Patient coming from: Home  I have personally briefly reviewed patient's old medical records in [place]  Chief Complaint: Chest pain, palpitations  HPI: [name] is a 79 y.o. 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  Patient was recently admitted at [facility] for NSTEMI and underwent CABG x4 on 01/08/2020.  He was discharged on 01/15/2020.  Patient states that since then he has been feeling well until yesterday 01/29/2020 afternoon.  He says he was helping his brother doing kitchen work/cooking.  Afterwards he felt a substernal chest pain which was intermittent and without radiation to his neck, jaw, back, or arms.  He felt as if he might of pulled a muscle.  He says this pain was different than when he was last admitted, at that time he did not have pain but only chest pressure.  He says last night when he went to his sleep he began to feel like his heart was racing.  When he woke up this morning he again had intermittent palpitations and substernal chest pain.  He came to the ED for further evaluation.  He denies any associated subjective fevers, diaphoresis, chills, nausea, vomiting, lightheadedness, dizziness, syncope, abdominal pain, diarrhea, or swelling in his feet or legs.  Of note he was recently planned to switch from twice daily metoprolol to Toprol-XL 25 mg once daily by his cardiologist as he admits that he sometimes misses doses here and there.  He says he has been continuing to take the twice daily metoprolol until he has used it all up and then plans to switch to the Toprol-XL as prescribed.  He says he is still missing occasional doses but feels that he did take it last night.  ED Course:    Initial vitals show BP 165/87, pulse 69, RR 20, SPO2 98% on room air.  Labs are notable for sodium 132, potassium 4.4, bicarb 23, BUN 11, creatinine 0.76, WBC 7.6, hemoglobin 10.8, platelets 388,000, high-sensitivity troponin I 31.  SARS-CoV-2 PCR is collected and pending.  Portable chest x-ray showed post sternotomy and CABG changes with improving bilateral pleural effusions.  Patient was given IV diltiazem 10 mg once and IV Ativan 0.5 mg once.  Rate improved with apparent conversion to sinus rhythm.  EDP discussed the case with on-call cardiology who recommended observation overnight and that if patient returns to prolonged atrial flutter/fibrillation would need to heparinize.  The hospitalist service was consulted to admit for further evaluation and management.  Review of Systems: All systems reviewed and are negative except as documented in history of present illness above.   Past Medical History:  Diagnosis Date  . Arthritis   . Bone spur 2010   right shoulder  . CAD (coronary artery disease)    a. 12/2019 NSTEMI/Cath: LM 99ost/m, LAD 40, LCX nl, OM1 80, RCA 70ost/p, 99p, 30d, EF 45-50%; b. 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  . Hyperlipidemia   . Hypertension   . IBS (irritable bowel syndrome)   . Ischemic cardiomyopathy    a. 12/2019 TEE: EF 45-50%. Nl RV fxn. Mild MR.    Past Surgical History:  Procedure Laterality Date  . BACK SURGERY     Lumbar  . BUNIONECTOMY    .  CORONARY ARTERY BYPASS GRAFT N/A 01/08/2020   Procedure: CORONARY ARTERY BYPASS GRAFTING (CABG) x 4  WITH ENDOSCOPIC HARVESTING OF BILATERAL GREATER SAPHENOUS VEINS;  Surgeon: Ivin Poot, MD;  Location: [place];  Service: Open Heart Surgery;  Laterality: N/A;  . HEMORRHOID SURGERY  2004  . INGUINAL HERNIA REPAIR  02/11  . LEFT HEART CATH AND CORONARY ANGIOGRAPHY N/A 01/08/2020   Procedure: LEFT HEART CATH AND CORONARY ANGIOGRAPHY;  Surgeon: Wellington Hampshire, MD;  Location: [place] [facility];  Service: Cardiovascular;  Laterality: N/A;  . ROTATOR CUFF REPAIR  07/11   left/ bone spur Dr.Blackman  . SHOULDER ARTHROSCOPY WITH OPEN ROTATOR CUFF REPAIR Right 05/26/2018   Procedure: SHOULDER ARTHROSCOPY WITH OPEN ROTATOR CUFF REPAIR;  Surgeon: Corky Mull, MD;  Location: [facility];  Service: Orthopedics;  Laterality: Right;  debridement, decompression  . SKIN CANCER EXCISION  2003-2005   right shoulder  . TEE WITHOUT CARDIOVERSION N/A 01/08/2020   Procedure: TRANSESOPHAGEAL ECHOCARDIOGRAM (TEE);  Surgeon: Prescott Gum, Collier Salina, MD;  Location: [place];  Service: Open Heart Surgery;  Laterality: N/A;    Social History:  reports that he has never smoked. He has never used smokeless tobacco. He reports current alcohol use of about 1.0 standard drinks of alcohol per week. He reports that he does not use drugs.  Allergies  Allergen Reactions  . Crestor [Rosuvastatin] Other (See Comments)    Severe myalgias  . Penicillins Rash  . Peanut-Containing Drug Products   . Atorvastatin Other (See Comments)    ache  . Simvastatin Other (See Comments)    aching    Family History  Problem Relation Age of Onset  . Cancer Neg Hx   . Diabetes Neg Hx      Prior to Admission medications   Medication Sig Start Date End Date Taking? Authorizing Provider  aspirin EC 325 MG EC tablet Take 1 tablet (325 mg total) by mouth daily. 01/14/20   Gold, Wilder Glade, PA-C  ezetimibe (ZETIA) 10 MG tablet Take 1 tablet (10 mg total) by mouth daily. 01/25/20 04/24/20  Theora Gianotti, NP  ferrous Q000111Q C-folic acid (TRINSICON / FOLTRIN) capsule Take 1 capsule by mouth 2 (two) times daily after a meal. Patient taking differently: Take 1 capsule by mouth every other day.  01/14/20   Gold, Wilder Glade, PA-C  hyoscyamine (ANASPAZ) 0.125 MG TBDP disintergrating tablet Place 0.125 mg under the tongue 2 (two) times daily as needed for bladder spasms.    [provider]  lisinopril (ZESTRIL) 2.5 MG  tablet Take 1 tablet (2.5 mg total) by mouth daily. 01/14/20   Gold, Wayne E, PA-C  lovastatin (MEVACOR) 20 MG tablet Take 20 mg by mouth daily.     [provider]  metoprolol succinate (TOPROL XL) 25 MG 24 hr tablet Take 1 tablet (25 mg total) by mouth daily. 01/25/20   Theora Gianotti, NP  Multiple Vitamin (MULTIVITAMIN WITH MINERALS) TABS tablet Take 1 tablet by mouth daily. 01/14/20   Gold, Wayne E, PA-C  Psyllium (METAMUCIL PO) Take 1 Dose by mouth at bedtime. Mix with water    [provider]    Physical Exam: Vitals:   01/30/20 1919 01/30/20 1921  BP:  (!) 165/87  Pulse:  (!) 142  Resp:  20  SpO2:  98%  Weight: 61.2 kg   Height: 5\' 7"  (1.702 m)    Constitutional: Elderly man resting supine in bed, NAD, calm, comfortable Eyes: PERRL, lids and conjunctivae  normal ENMT: Mucous membranes are moist. Posterior pharynx clear of any exudate or lesions.  Neck: normal, supple, no masses. Respiratory: clear to auscultation bilaterally, no wheezing, no crackles. Normal respiratory effort. No accessory muscle use.  Cardiovascular: Regular rate and rhythm, no murmurs / rubs / gallops. No extremity edema. 2+ pedal pulses. Abdomen: no tenderness, no masses palpated. No hepatosplenomegaly. Bowel sounds positive.  Musculoskeletal: no clubbing / cyanosis. No joint deformity upper and lower extremities. Good ROM, no contractures. Normal muscle tone.  Skin: Well-healing surgical sternotomy scar.  No rashes, lesions, ulcers. No induration Neurologic: CN 2-12 grossly intact. Sensation intact,Strength 5/5 in all 4.  Psychiatric: Normal judgment and insight. Alert and oriented x 3. Normal mood.     Labs on Admission: I have personally reviewed following labs and imaging studies  CBC: Recent Labs  Lab 01/25/20 1221 01/30/20 1942  WBC 8.6 7.6  NEUTROABS  --  4.0  HGB 10.8* 10.8*  HCT 33.7* 33.1*  MCV 91 88.3  PLT 591* [id number]   Basic Metabolic Panel: Recent Labs  Lab  01/25/20 1221 01/30/20 1942  NA 137 132*  K 5.3* 4.4  CL 102 98  CO2 21 23  GLUCOSE 91 145*  BUN 9 11  CREATININE 0.89 0.76  CALCIUM 8.9 8.9   GFR: Estimated Creatinine Clearance: 65.9 mL/min (by C-G formula based on SCr of 0.76 mg/dL). Liver Function Tests: Recent Labs  Lab 01/30/20 1942  AST 23  ALT 19  ALKPHOS 158*  BILITOT 0.8  PROT 7.1  ALBUMIN 3.9   No results for input(s): LIPASE, AMYLASE in the last 168 hours. No results for input(s): AMMONIA in the last 168 hours. Coagulation Profile: No results for input(s): INR, PROTIME in the last 168 hours. Cardiac Enzymes: No results for input(s): CKTOTAL, CKMB, CKMBINDEX, TROPONINI in the last 168 hours. BNP (last 3 results) No results for input(s): PROBNP in the last 8760 hours. HbA1C: No results for input(s): HGBA1C in the last 72 hours. CBG: No results for input(s): GLUCAP in the last 168 hours. Lipid Profile: No results for input(s): CHOL, HDL, LDLCALC, TRIG, CHOLHDL, LDLDIRECT in the last 72 hours. Thyroid Function Tests: No results for input(s): TSH, T4TOTAL, FREET4, T3FREE, THYROIDAB in the last 72 hours. Anemia Panel: No results for input(s): VITAMINB12, FOLATE, FERRITIN, TIBC, IRON, RETICCTPCT in the last 72 hours. Urine analysis:    Component Value Date/Time   COLORURINE yellow 02/26/2009 1157   APPEARANCEUR Clear 02/26/2009 1157   LABSPEC <1.005 02/26/2009 1157   PHURINE 6.5 02/26/2009 1157   HGBUR negative 02/26/2009 1157   BILIRUBINUR negative 02/26/2009 1157   UROBILINOGEN 0.2 02/26/2009 1157   NITRITE negative 02/26/2009 1157    Radiological Exams on Admission: DG Chest Port 1 View  Result Date: 01/30/2020 CLINICAL DATA:  Chest pain. EXAM: PORTABLE CHEST 1 VIEW COMPARISON:  Radiograph 01/12/2020. CT 01/07/2020 FINDINGS: Post median sternotomy and CABG. Mild cardiomegaly. Bilateral pleural effusions have improved, greatest residual at the left lung base. Associated bibasilar opacities likely  atelectasis. No septal thickening or definite pulmonary edema. No pneumothorax. No acute osseous abnormalities are seen. IMPRESSION: Bilateral pleural effusions and improved from imaging 3 weeks ago, moderate residual pleural fluid at the left lung base with associated atelectasis. Electronically Signed   By: Keith Rake M.D.   On: 01/30/2020 20:00    EKG: Independently reviewed.  Atrial flutter 2:1 AV block.  Previous EKG showed sinus rhythm with PACs and bigeminy pattern.  Assessment/Plan Principal Problem:   Paroxysmal atrial flutter (  [place]) Active Problems:   Hypertension   Hyperlipidemia   S/P CABG x 4   CAD (coronary artery disease)   Ischemic cardiomyopathy  [name] is a 79 y.o. male with medical history significant for CAD s/p CABG x4 on 01/08/2020, ischemic cardiomyopathy, hypertension, and hyperlipidemia who is admitted with paroxysmal atrial flutter.  Paroxysmal atrial flutter: Atrial flutter in 2:1 AV block noted on EKG on presentation to the ED.  He was given IV diltiazem 10 mg once with conversion to sinus rhythm and controlled rate.  CHA2DS2-VASc score is 4-5.  If he is found to have recurrent atrial flutter or fibrillation, he would be a candidate for anticoagulation.  Intraoperative TEE on 01/08/2020 showed trivial aortic regurgitation, valves were otherwise normal in appearance although the pulmonic valve was not assessed. -Monitor on telemetry -Resume Toprol-XL 25 mg daily for rate control -Continue aspirin 325 mg daily, consider addition of DOAC as above -Check magnesium  CAD s/p CABG x4 on 01/08/2020: Reports nonspecific substernal chest pain, states this is different from his prior chest pressure at time of his NSTEMI.  High-sensitivity troponin I minimally elevated at 31. -Follow repeat troponin -Continue aspirin, Toprol-XL, lovastatin, Zetia  Ischemic cardiomyopathy: EF 45-50%.  He is euvolemic.  Continue Toprol-XL, lovastatin, Zetia,  lisinopril.  Hypertension: Currently stable, continue Toprol-XL and lisinopril.  Hyperlipidemia: Continue lovastatin, Zetia.  DVT prophylaxis: Lovenox Code Status: Full code, confirmed with patient Family Communication: Discussed with patient significant other Licensed conveyancer by phone, [phone] Disposition Plan: Likely discharge to home in 1 day Consults called: None Admission status: Observation   Zada Finders MD [facility]  If 7PM-7AM, please contact night-coverage [email or web address]  01/30/2020, 8:52 PM

## 2020-01-30 NOTE — Telephone Encounter (Signed)
Ms. [name] called because Mr. [name] developed chest pain today.  He had been doing well, following the doctor's instructions.  However, today he was in a stressful situation for a couple of hours on the phone.  Then, he was standing for long period of time to help care for his mother.  After all of this, he developed chest pain.  It reminded him of his pre-CABG symptoms.  When his symptoms did not resolve, [facility]. [name]  Currently he is there.  I advised her that it was the right thing to do to get him there and possibly he had done too much today.  She states she will do everything in her power to make and follow the doctor's instructions when he comes home.  Johnathan Ferries, PA-C 01/30/2020 7:43 PM

## 2020-01-30 NOTE — ED Provider Notes (Signed)
[facility] Emergency Department Provider Note       Time seen: ----------------------------------------- 7:28 PM on 01/30/2020 -----------------------------------------   I have reviewed the triage vital signs and the nursing notes.  HISTORY   Chief Complaint Chest Pain    HPI [name] is a 79 y.o. male with a history of coronary artery disease, status post four-vessel bypass 2 weeks ago, hyperlipidemia, hypertension, ischemic cardiomyopathy who presents to the ED for chest pain that is nonradiating with no accompanied symptoms since last night.  Patient states his heart has been racing as well.  Discomfort is 6 out of 10, he is not sure if he has had an arrhythmia before.  He denies any recent illness.  Past Medical History:  Diagnosis Date  . Arthritis   . Bone spur 2010   right shoulder  . CAD (coronary artery disease)    a. 12/2019 NSTEMI/Cath: LM 99ost/m, LAD 40, LCX nl, OM1 80, RCA 70ost/p, 99p, 30d, EF 45-50%; b. 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  . Hyperlipidemia   . Hypertension   . IBS (irritable bowel syndrome)   . Ischemic cardiomyopathy    a. 12/2019 TEE: EF 45-50%. Nl RV fxn. Mild MR.    Patient Active Problem List   Diagnosis Date Noted  . 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 Bilateral pleural effusion 01/07/2020  . Chest pain 01/07/2020  . NSTEMI (non-ST elevated myocardial infarction) ([place]) 01/07/2020  . Hyponatremia 01/07/2020  . Hypertension   . Hyperlipidemia   . Elevated troponin   . Advance directive discussed with patient 05/25/2016  . Routine general medical examination at a health care facility 04/05/2012  . IRRITABLE BOWEL SYNDROME 07/11/2010  . DIVERTICULOSIS-COLON 12/06/2009  . HEPATIC CYST 12/06/2009  . 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 Hyperlipemia 03/03/2007  . Essential hypertension, benign 03/03/2007  . OSTEOARTHRITIS 03/03/2007    Past Surgical History:  Procedure Laterality Date  . BACK  SURGERY     Lumbar  . BUNIONECTOMY    . CORONARY ARTERY BYPASS GRAFT N/A 01/08/2020   Procedure: CORONARY ARTERY BYPASS GRAFTING (CABG) x 4  WITH ENDOSCOPIC HARVESTING OF BILATERAL GREATER SAPHENOUS VEINS;  Surgeon: Ivin Poot, MD;  Location: [place];  Service: Open Heart Surgery;  Laterality: N/A;  . HEMORRHOID SURGERY  2004  . INGUINAL HERNIA REPAIR  02/11  . LEFT HEART CATH AND CORONARY ANGIOGRAPHY N/A 01/08/2020   Procedure: LEFT HEART CATH AND CORONARY ANGIOGRAPHY;  Surgeon: Wellington Hampshire, MD;  Location: [place] [facility];  Service: Cardiovascular;  Laterality: N/A;  . ROTATOR CUFF REPAIR  07/11   left/ bone spur Dr.Blackman  . SHOULDER ARTHROSCOPY WITH OPEN ROTATOR CUFF REPAIR Right 05/26/2018   Procedure: SHOULDER ARTHROSCOPY WITH OPEN ROTATOR CUFF REPAIR;  Surgeon: Corky Mull, MD;  Location: [facility];  Service: Orthopedics;  Laterality: Right;  debridement, decompression  . SKIN CANCER EXCISION  2003-2005   right shoulder  . TEE WITHOUT CARDIOVERSION N/A 01/08/2020   Procedure: TRANSESOPHAGEAL ECHOCARDIOGRAM (TEE);  Surgeon: Prescott Gum, Collier Salina, MD;  Location: [place];  Service: Open Heart Surgery;  Laterality: N/A;    Allergies Crestor [rosuvastatin], Penicillins, Peanut-containing drug products, Atorvastatin, and Simvastatin  Social History Social History   Tobacco Use  . Smoking status: Never Smoker  . Smokeless tobacco: Never Used  Substance Use Topics  . Alcohol use: Yes    Alcohol/week: 1.0 standard drinks    Types: 1 Glasses of wine per week  . Drug  use: No    Review of Systems Constitutional: Negative for fever. Cardiovascular: Positive for chest pain, rapid heartbeat Respiratory: Negative for shortness of breath. Gastrointestinal: Negative for abdominal pain, vomiting and diarrhea. Musculoskeletal: Negative for back pain. Skin: Negative for rash. Neurological: Negative for headaches, positive for weakness  All systems negative/normal/unremarkable  except as stated in the HPI  ____________________________________________   PHYSICAL EXAM:  VITAL SIGNS: ED Triage Vitals  Enc Vitals Group     BP 01/30/20 1921 (!) 165/87     Pulse Rate 01/30/20 1921 (!) 142     Resp 01/30/20 1921 20     Temp --      Temp src --      SpO2 01/30/20 1921 98 %     Weight 01/30/20 1919 135 lb (61.2 kg)     Height 01/30/20 1919 5\' 7"  (1.702 m)     Head Circumference --      Peak Flow --      Pain Score 01/30/20 1919 6     Pain Loc --      Pain Edu? --      Excl. in [place]? --     Constitutional: Alert and oriented.  Mild distress Eyes: Conjunctivae are normal. Normal extraocular movements. ENT      Head: Normocephalic and atraumatic.      Nose: No congestion/rhinnorhea.      Mouth/Throat: Mucous membranes are moist.      Neck: No stridor. Cardiovascular: Rapid rate, regular rhythm. No murmurs, rubs, or gallops. Respiratory: Normal respiratory effort without tachypnea nor retractions.  Trace rales Gastrointestinal: Soft and nontender. Normal bowel sounds Musculoskeletal: Nontender with normal range of motion in extremities. No lower extremity tenderness nor edema. Neurologic:  Normal speech and language. No gross focal neurologic deficits are appreciated.  Skin:  Skin is warm, dry and intact. No rash noted. Psychiatric: Mood and affect are normal. Speech and behavior are normal.  ____________________________________________  EKG: Interpreted by me.  Atrial flutter with 2-1 AV conduction, possible septal infarct age-indeterminate, normal axis, normal QT Repeat EKG interpreted by me, atrial flutter with 2-1 AV block, rate is 126 bpm, borderline low voltage, normal QT  ____________________________________________  ED COURSE:  As part of my medical decision making, I reviewed the following data within the [place] History obtained from family if available, nursing notes, old chart and ekg, as well as notes from prior ED  visits. Patient presented for chest pain and tachycardia, we will assess with labs and imaging as indicated at this time. Clinical Course as of Jan 30 1948  Tue Jan 30, 2020  1942 Heart rate seems markedly improved after 1 dose of IV Cardizem.  Patient is also anxious and received IV Ativan at low dose.   [JW]    Clinical Course User Index [JW] Earleen Newport, MD   Procedures  Maudie Flakes Killam was evaluated in Emergency Department on 01/30/2020 for the symptoms described in the history of present illness. He was evaluated in the context of the global COVID-19 pandemic, which necessitated consideration that the patient might be at risk for infection with the SARS-CoV-2 virus that causes COVID-19. Institutional protocols and algorithms that pertain to the evaluation of patients at risk for COVID-19 are in a state of rapid change based on information released by regulatory bodies including the CDC and federal and state organizations. These policies and algorithms were followed during the patient's care in the ED.  ____________________________________________   LABS (pertinent positives/negatives)  Labs Reviewed  CBC WITH DIFFERENTIAL/PLATELET  COMPREHENSIVE METABOLIC PANEL  TROPONIN I (HIGH SENSITIVITY)   CRITICAL CARE Performed by: Laurence Aly   Total critical care time: 30 minutes  Critical care time was exclusive of separately billable procedures and treating other patients.  Critical care was necessary to treat or prevent imminent or life-threatening deterioration.  Critical care was time spent personally by me on the following activities: development of treatment plan with patient and/or surrogate as well as nursing, discussions with consultants, evaluation of patient's response to treatment, examination of patient, obtaining history from patient or surrogate, ordering and performing treatments and interventions, ordering and review of laboratory studies, ordering and  review of radiographic studies, pulse oximetry and re-evaluation of patient's condition.  RADIOLOGY Images were viewed by me  Chest x-ray IMPRESSION:  Bilateral pleural effusions and improved from imaging 3 weeks ago,  moderate residual pleural fluid at the left lung base with  associated atelectasis.  ____________________________________________   DIFFERENTIAL DIAGNOSIS   Arrhythmia, MI, pericarditis, pleural effusion, CHF  FINAL ASSESSMENT AND PLAN  Chest pain, tachycardia   Plan: The patient had presented for chest pain and tachycardia. Patient's labs were overall reassuring. Patient's imaging revealed improving bilateral pleural effusions as expected status post CABG.  I will discuss with cardiology on-call and likely have the patient admitted for observation.  He remains in rate controlled atrial flutter.   Laurence Aly, MD    Note: This note was generated in part or whole with voice recognition software. Voice recognition is usually quite accurate but there are transcription errors that can and very often do occur. I apologize for any typographical errors that were not detected and corrected.     Earleen Newport, MD 01/30/20 2029

## 2020-01-30 NOTE — Plan of Care (Signed)
  Problem: Education: Goal: Knowledge of General Education information will improve Description: Including pain rating scale, medication(s)/side effects and non-pharmacologic comfort measures Outcome: Progressing Note: Patient profile completed. Covid results are not available, placed patient on precautions. Complains of chest pain but declines any medication.

## 2020-01-30 NOTE — ED Provider Notes (Signed)
Patient seems to have converted into a normal sinus rhythm currently.  We will still plan for hospital observation.   Earleen Newport, MD 01/30/20 2040

## 2020-01-30 NOTE — ED Triage Notes (Signed)
Pt to triage via w/c with no distress noted, mask in place; pt reports upper CP, nonradiating with no accomp symptoms since last night

## 2020-01-31 ENCOUNTER — Other Ambulatory Visit: Payer: Self-pay

## 2020-01-31 DIAGNOSIS — I2510 Atherosclerotic heart disease of native coronary artery without angina pectoris: Secondary | ICD-10-CM | POA: Diagnosis not present

## 2020-01-31 DIAGNOSIS — I4892 Unspecified atrial flutter: Secondary | ICD-10-CM | POA: Diagnosis present

## 2020-01-31 DIAGNOSIS — I110 Hypertensive heart disease with heart failure: Secondary | ICD-10-CM | POA: Diagnosis present

## 2020-01-31 DIAGNOSIS — K589 Irritable bowel syndrome without diarrhea: Secondary | ICD-10-CM | POA: Diagnosis present

## 2020-01-31 DIAGNOSIS — I483 Typical atrial flutter: Secondary | ICD-10-CM | POA: Diagnosis not present

## 2020-01-31 DIAGNOSIS — E782 Mixed hyperlipidemia: Secondary | ICD-10-CM | POA: Diagnosis not present

## 2020-01-31 DIAGNOSIS — E7800 Pure hypercholesterolemia, unspecified: Secondary | ICD-10-CM

## 2020-01-31 DIAGNOSIS — Z85828 Personal history of other malignant neoplasm of skin: Secondary | ICD-10-CM | POA: Diagnosis not present

## 2020-01-31 DIAGNOSIS — Z951 Presence of aortocoronary bypass graft: Secondary | ICD-10-CM | POA: Diagnosis not present

## 2020-01-31 DIAGNOSIS — Z20822 Contact with and (suspected) exposure to covid-19: Secondary | ICD-10-CM | POA: Diagnosis present

## 2020-01-31 DIAGNOSIS — R0789 Other chest pain: Secondary | ICD-10-CM | POA: Diagnosis present

## 2020-01-31 DIAGNOSIS — Z599 Problem related to housing and economic circumstances, unspecified: Secondary | ICD-10-CM | POA: Diagnosis not present

## 2020-01-31 DIAGNOSIS — Z91010 Allergy to peanuts: Secondary | ICD-10-CM | POA: Diagnosis not present

## 2020-01-31 DIAGNOSIS — Z7982 Long term (current) use of aspirin: Secondary | ICD-10-CM | POA: Diagnosis not present

## 2020-01-31 DIAGNOSIS — I255 Ischemic cardiomyopathy: Secondary | ICD-10-CM | POA: Diagnosis not present

## 2020-01-31 DIAGNOSIS — I10 Essential (primary) hypertension: Secondary | ICD-10-CM | POA: Diagnosis not present

## 2020-01-31 DIAGNOSIS — I25118 Atherosclerotic heart disease of native coronary artery with other forms of angina pectoris: Secondary | ICD-10-CM | POA: Diagnosis not present

## 2020-01-31 DIAGNOSIS — Z880 Allergy status to penicillin: Secondary | ICD-10-CM | POA: Diagnosis not present

## 2020-01-31 DIAGNOSIS — I441 Atrioventricular block, second degree: Secondary | ICD-10-CM | POA: Diagnosis present

## 2020-01-31 DIAGNOSIS — Z888 Allergy status to other drugs, medicaments and biological substances status: Secondary | ICD-10-CM | POA: Diagnosis not present

## 2020-01-31 DIAGNOSIS — I252 Old myocardial infarction: Secondary | ICD-10-CM | POA: Diagnosis not present

## 2020-01-31 DIAGNOSIS — Z8249 Family history of ischemic heart disease and other diseases of the circulatory system: Secondary | ICD-10-CM | POA: Diagnosis not present

## 2020-01-31 DIAGNOSIS — D649 Anemia, unspecified: Secondary | ICD-10-CM | POA: Diagnosis present

## 2020-01-31 DIAGNOSIS — M1990 Unspecified osteoarthritis, unspecified site: Secondary | ICD-10-CM | POA: Diagnosis present

## 2020-01-31 DIAGNOSIS — I5020 Unspecified systolic (congestive) heart failure: Secondary | ICD-10-CM | POA: Diagnosis present

## 2020-01-31 DIAGNOSIS — E785 Hyperlipidemia, unspecified: Secondary | ICD-10-CM | POA: Diagnosis present

## 2020-01-31 LAB — CBC
HCT: 30.7 % — ABNORMAL LOW (ref 39.0–52.0)
Hemoglobin: 9.9 g/dL — ABNORMAL LOW (ref 13.0–17.0)
MCH: 28.8 pg (ref 26.0–34.0)
MCHC: 32.2 g/dL (ref 30.0–36.0)
MCV: 89.2 fL (ref 80.0–100.0)
Platelets: 311 10*3/uL (ref 150–400)
RBC: 3.44 MIL/uL — ABNORMAL LOW (ref 4.22–5.81)
RDW: 12.9 % (ref 11.5–15.5)
WBC: 6 10*3/uL (ref 4.0–10.5)
nRBC: 0 % (ref 0.0–0.2)

## 2020-01-31 LAB — BASIC METABOLIC PANEL
Anion gap: 5 (ref 5–15)
BUN: 9 mg/dL (ref 8–23)
CO2: 26 mmol/L (ref 22–32)
Calcium: 8.2 mg/dL — ABNORMAL LOW (ref 8.9–10.3)
Chloride: 102 mmol/L (ref 98–111)
Creatinine, Ser: 0.78 mg/dL (ref 0.61–1.24)
GFR calc Af Amer: >60 mL/min (ref >60)
GFR calc non Af Amer: >60 mL/min (ref >60)
Glucose, Bld: 97 mg/dL (ref 70–99)
Potassium: 4.6 mmol/L (ref 3.5–5.1)
Sodium: 133 mmol/L — ABNORMAL LOW (ref 135–145)

## 2020-01-31 LAB — TSH: TSH: 1.929 u[IU]/mL (ref 0.350–4.500)

## 2020-01-31 LAB — PROTIME-INR
INR: 1 (ref 0.8–1.2)
Prothrombin Time: 13.3 seconds (ref 11.4–15.2)

## 2020-01-31 LAB — SARS CORONAVIRUS 2 (TAT 6-24 HRS): SARS Coronavirus 2: NEGATIVE

## 2020-01-31 LAB — APTT: aPTT: 34 seconds (ref 24–36)

## 2020-01-31 MED ORDER — HEPARIN (PORCINE) 25000 UT/250ML-% IV SOLN
850.0000 [IU]/h | INTRAVENOUS | Status: DC
  Filled 2020-01-31: qty 250

## 2020-01-31 MED ORDER — RIVAROXABAN 20 MG PO TABS
20.0000 mg | ORAL_TABLET | Freq: Every day | ORAL | Status: DC
  Administered 2020-01-31 – 2020-02-01 (×2): 20 mg via ORAL
  Filled 2020-01-31 (×2): qty 1

## 2020-01-31 MED ORDER — DOCUSATE SODIUM 100 MG PO CAPS
100.0000 mg | ORAL_CAPSULE | Freq: Every day | ORAL | Status: DC
  Administered 2020-01-31 – 2020-02-01 (×2): 100 mg via ORAL
  Filled 2020-01-31 (×2): qty 1

## 2020-01-31 MED ORDER — METOPROLOL TARTRATE 25 MG PO TABS
25.0000 mg | ORAL_TABLET | Freq: Two times a day (BID) | ORAL | Status: DC
  Administered 2020-01-31 – 2020-02-01 (×2): 25 mg via ORAL
  Filled 2020-01-31 (×2): qty 1

## 2020-01-31 MED ORDER — ASPIRIN EC 81 MG PO TBEC
81.0000 mg | DELAYED_RELEASE_TABLET | Freq: Every day | ORAL | Status: DC
  Administered 2020-02-01: 09:00:00 81 mg via ORAL
  Filled 2020-01-31 (×2): qty 1

## 2020-01-31 MED ORDER — HEPARIN BOLUS VIA INFUSION
3000.0000 [IU] | Freq: Once | INTRAVENOUS | Status: DC
  Filled 2020-01-31: qty 3000

## 2020-01-31 NOTE — Consult Note (Addendum)
[facility] for Heparin Indication: atrial fibrillation  Allergies  Allergen Reactions  . Crestor [Rosuvastatin] Other (See Comments)    Severe myalgias  . Penicillins Rash  . Peanut-Containing Drug Products   . Atorvastatin Other (See Comments)    ache  . Simvastatin Other (See Comments)    aching    Patient Measurements: Height: 5\' 6"  (167.6 cm) Weight: 134 lb 11.2 oz (61.1 kg) IBW/kg (Calculated) : 63.8 Heparin Dosing Weight: 61.1 kg  Vital Signs: Temp: 98.3 F (36.8 C) (02/10 0740) Temp Source: Oral (02/10 0740) BP: 126/71 (02/10 0740) Pulse Rate: 76 (02/10 0740)  Labs: Recent Labs    01/30/20 1942 01/30/20 2121 01/31/20 0651  HGB 10.8*  --  9.9*  HCT 33.1*  --  30.7*  PLT 388  --  311  CREATININE 0.76  --  0.78  TROPONINIHS 31* 32*  --     Estimated Creatinine Clearance: 65.8 mL/min (by C-G formula based on SCr of 0.78 mg/dL).   Medical History: Past Medical History:  Diagnosis Date  . Arthritis   . Bone spur 2010   right shoulder  . CAD (coronary artery disease)    a. 12/2019 NSTEMI/Cath: LM 99ost/m, LAD 40, LCX nl, OM1 80, RCA 70ost/p, 99p, 30d, EF 45-50%; b. 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  . Hyperlipidemia   . Hypertension   . IBS (irritable bowel syndrome)   . Ischemic cardiomyopathy    a. 12/2019 TEE: EF 45-50%. Nl RV fxn. Mild MR.    Medications:  Medications Prior to Admission  Medication Sig Dispense Refill Last Dose  . ezetimibe (ZETIA) 10 MG tablet Take 1 tablet (10 mg total) by mouth daily. 90 tablet 3 01/30/2020 at 0900  . hyoscyamine (ANASPAZ) 0.125 MG TBDP disintergrating tablet Place 0.125 mg under the tongue 2 (two) times daily as needed for bladder spasms.   prn at prn  . lisinopril (ZESTRIL) 2.5 MG tablet Take 1 tablet (2.5 mg total) by mouth daily. 30 tablet 1 01/30/2020 at 0900  . lovastatin (MEVACOR) 20 MG tablet Take 20 mg by mouth daily.    01/30/2020 at 0900  . metoprolol  succinate (TOPROL XL) 25 MG 24 hr tablet Take 1 tablet (25 mg total) by mouth daily. 90 tablet 3 01/30/2020 at 0900  . Multiple Vitamin (MULTIVITAMIN WITH MINERALS) TABS tablet Take 1 tablet by mouth daily.   01/30/2020 at 0900  . Psyllium (METAMUCIL PO) Take 1 Dose by mouth at bedtime. Mix with water   01/29/2020 at Unknown time  . aspirin EC 325 MG EC tablet Take 1 tablet (325 mg total) by mouth daily. (Patient not taking: Reported on 01/30/2020)   Not Taking  . ferrous [id number] C-folic acid (TRINSICON / FOLTRIN) capsule Take 1 capsule by mouth 2 (two) times daily after a meal. (Patient not taking: Reported on 01/30/2020) 60 capsule 2 Not Taking at Unknown time   Scheduled:  . aspirin EC  81 mg Oral Daily  . ezetimibe  10 mg Oral Daily  . metoprolol tartrate  25 mg Oral BID  . pravastatin  20 mg Oral q1800  . sodium chloride flush  10 mL Intravenous Q12H   Infusions:   PRN: acetaminophen, metoprolol tartrate, ondansetron (ZOFRAN) IV Anti-infectives (From admission, onward)   None      Assessment: Pharmacy consulted to start heparin for afib. No DOAC PTA. CHADSVASc 5.   Goal of Therapy:  Heparin level 0.3-0.7 units/ml Monitor platelets by anticoagulation  protocol: Yes   Plan:  Give 3000 units bolus x 1 Start heparin infusion at 850 units/hr Check anti-Xa level in 8 hours and daily while on heparin Continue to monitor H&H and platelets  Oswald Hillock, PharmD, BCPS 01/31/2020,9:39 AM

## 2020-01-31 NOTE — Progress Notes (Signed)
PROGRESS NOTE    [name]  [id number] DOB: 1941-10-29 DOA: 01/30/2020 PCP: Venia Carbon, MD      Assessment & Plan:   Principal Problem:   Paroxysmal atrial flutter ([place]) Active Problems:   Hypertension   Hyperlipidemia   S/P CABG x 4   CAD (coronary artery disease)   Ischemic cardiomyopathy   Paroxysmal atrial flutter: CHA2DS2-VASc score is 4-5.  Intraoperative TEE on 01/08/2020 showed trivial aortic regurgitation, valves were otherwise normal in appearance although the pulmonic valve was not assessed. Continue on metoprolol, aspirin. Started on xarelto as per cardio   CAD: s/p CABG x4 on 01/08/2020. High-sensitivity troponin I minimally elevated at 31. Continue aspirin, metoprolol, lovastatin, Zetia. Cardio following and recs apprec   Ischemic cardiomyopathy: EF 45-50%.  Euvolemic currently.  Continue metoprolol, lovastatin, Zetia, lisinopril.  Hypertension: continue metoprolol and lisinopril.  Hyperlipidemia: continue lovastatin, Zetia.  Normocytic anemia: no need for a transfusion at this time. Will continue to monitor H&H  Elevated alkaline phosphate: etiology unclear. Will continue to monitor    DVT prophylaxis: xarelto  Code Status: full  Family Communication:  Disposition Plan:    Consultants:   cardio   Procedures:    Antimicrobials:    Subjective: Pt c/o palpitations  Objective: Vitals:   01/30/20 2205 01/31/20 0429 01/31/20 0500 01/31/20 0740  BP: (!) 129/52 117/69  126/71  Pulse: (!) 45 75  76  Resp: 19 19  18   Temp: 97.8 F (36.6 C) 97.8 F (36.6 C)  98.3 F (36.8 C)  TempSrc: Oral Oral  Oral  SpO2: 99% 100%  100%  Weight:   61.1 kg   Height:        Intake/Output Summary (Last 24 hours) at 01/31/2020 0843 Last data filed at 01/31/2020 0217 Gross per 24 hour  Intake --  Output 1125 ml  Net -1125 ml   Filed Weights   01/30/20 1919 01/30/20 2202 01/31/20 0500  Weight: 61.2 kg 61.1 kg 61.1 kg     Examination: General exam: Appears calm and comfortable  Respiratory system: decreased breath sounds b/l. No rales  Cardiovascular system: irregularly irregular. No  rubs, gallops or clicks.  Gastrointestinal system: Abdomen is nondistended, soft and nontender. Normal bowel sounds heard. Central nervous system: Alert and oriented. Moves all 4 extremities  Psychiatry: Judgement and insight appear normal. Flat mood and affect     Data Reviewed: I have personally reviewed following labs and imaging studies  CBC: Recent Labs  Lab 01/25/20 1221 01/30/20 1942 01/31/20 0651  WBC 8.6 7.6 6.0  NEUTROABS  --  4.0  --   HGB 10.8* 10.8* 9.9*  HCT 33.7* 33.1* 30.7*  MCV 91 88.3 89.2  PLT 591* 388 [id number]   Basic Metabolic Panel: Recent Labs  Lab 01/25/20 1221 01/30/20 1942 01/30/20 2121 01/31/20 0651  NA 137 132*  --  133*  K 5.3* 4.4  --  4.6  CL 102 98  --  102  CO2 21 23  --  26  GLUCOSE 91 145*  --  97  BUN 9 11  --  9  CREATININE 0.89 0.76  --  0.78  CALCIUM 8.9 8.9  --  8.2*  MG  --   --  2.1  --    GFR: Estimated Creatinine Clearance: 65.8 mL/min (by C-G formula based on SCr of 0.78 mg/dL). Liver Function Tests: Recent Labs  Lab 01/30/20 1942  AST 23  ALT 19  ALKPHOS 158*  BILITOT 0.8  PROT  7.1  ALBUMIN 3.9   No results for input(s): LIPASE, AMYLASE in the last 168 hours. No results for input(s): AMMONIA in the last 168 hours. Coagulation Profile: No results for input(s): INR, PROTIME in the last 168 hours. Cardiac Enzymes: No results for input(s): CKTOTAL, CKMB, CKMBINDEX, TROPONINI in the last 168 hours. BNP (last 3 results) No results for input(s): PROBNP in the last 8760 hours. HbA1C: No results for input(s): HGBA1C in the last 72 hours. CBG: No results for input(s): GLUCAP in the last 168 hours. Lipid Profile: No results for input(s): CHOL, HDL, LDLCALC, TRIG, CHOLHDL, LDLDIRECT in the last 72 hours. Thyroid Function Tests: No results for  input(s): TSH, T4TOTAL, FREET4, T3FREE, THYROIDAB in the last 72 hours. Anemia Panel: No results for input(s): VITAMINB12, FOLATE, FERRITIN, TIBC, IRON, RETICCTPCT in the last 72 hours. Sepsis Labs: No results for input(s): PROCALCITON, LATICACIDVEN in the last 168 hours.  Recent Results (from the past 240 hour(s))  SARS CORONAVIRUS 2 (TAT 6-24 HRS) Nasopharyngeal Nasopharyngeal Swab     Status: None   Collection Time: 01/30/20  9:38 PM   Specimen: Nasopharyngeal Swab  Result Value Ref Range Status   SARS Coronavirus 2 NEGATIVE NEGATIVE Final    Comment: (NOTE) SARS-CoV-2 target nucleic acids are NOT DETECTED. The SARS-CoV-2 RNA is generally detectable in upper and lower respiratory specimens during the acute phase of infection. Negative results do not preclude SARS-CoV-2 infection, do not rule out co-infections with other pathogens, and should not be used as the sole basis for treatment or other patient management decisions. Negative results must be combined with clinical observations, patient history, and epidemiological information. The expected result is Negative. Fact Sheet for Patients: SugarRoll.be Fact Sheet for Healthcare Providers: https://www.woods-mathews.com/ This test is not yet approved or cleared by the [place] FDA and  has been authorized for detection and/or diagnosis of SARS-CoV-2 by FDA under an Emergency Use Authorization (EUA). This EUA will remain  in effect (meaning this test can be used) for the duration of the COVID-19 declaration under Section 56 4(b)(1) of the Act, 21 U.S.C. section 360bbb-3(b)(1), unless the authorization is terminated or revoked sooner. Performed at [facility], [place] [address] [place], [place] [address]          Radiology Studies: DG Chest Port 1 View  Result Date: 01/30/2020 CLINICAL DATA:  Chest pain. EXAM: PORTABLE CHEST 1 VIEW COMPARISON:  Radiograph 01/12/2020. CT  01/07/2020 FINDINGS: Post median sternotomy and CABG. Mild cardiomegaly. Bilateral pleural effusions have improved, greatest residual at the left lung base. Associated bibasilar opacities likely atelectasis. No septal thickening or definite pulmonary edema. No pneumothorax. No acute osseous abnormalities are seen. IMPRESSION: Bilateral pleural effusions and improved from imaging 3 weeks ago, moderate residual pleural fluid at the left lung base with associated atelectasis. Electronically Signed   By: Keith Rake M.D.   On: 01/30/2020 20:00        Scheduled Meds: . aspirin  325 mg Oral Daily  . enoxaparin (LOVENOX) injection  40 mg Subcutaneous Q24H  . ezetimibe  10 mg Oral Daily  . lisinopril  2.5 mg Oral Daily  . metoprolol succinate  25 mg Oral Daily  . pravastatin  20 mg Oral q1800  . sodium chloride flush  10 mL Intravenous Q12H   Continuous Infusions:   LOS: 0 days    Time spent: 30 mins    Wyvonnia Dusky, MD [facility] Pager [phone]  If 7PM-7AM, please contact night-coverage [email or web address] 01/31/2020,  8:43 AM

## 2020-01-31 NOTE — Progress Notes (Signed)
Pt converted back to NSR at 73 beats per min.  MD updated.  Heparin IV cancelled.

## 2020-01-31 NOTE — Consult Note (Signed)
Cardiology Consultation:   Patient ID: [name]; [id number]; 1941-06-11   Admit date: 01/30/2020 Date of Consult: 01/31/2020  Primary Care Provider: Venia Carbon, MD Primary Cardiologist: Agbor-Etang   Patient Profile:   [name] is a 79 y.o. male with a hx of CAD with recent NSTEMI s/p recent 4-vessel CABG on 01/08/2020, HFrEF secondary to ICM, HTN, HLD with high and medium intensity statin intolerance, arthritis, and IBS who is being seen today for the evaluation of new onset atrial flutter with RVR at the request of Dr. Posey Pronto.  History of Present Illness:   [name] was admitted to the hospital on 01/07/2020 with a NSTEMI with HS-Tn peaking at 5807. LHC showed severe left main, OM, and RCA disease. He was transferred to [facility] with emergent bypass surgery. He underwent 4-vessel CABG on 01/08/2020 with LIMA to LAD, VG to rPDA, VG to OM1 to OM2. Perioperative TEE showed an EF of 45-50%. Postoperatively, he had an anemia of 7.8, though did not require pRBC transfusion. He was seen in the office on 2/4 and was doing well. He did note when he forgot his evening metoprolol he developed palpitations that would last for a few seconds and spontaneously resolve. EKG showed sinus rhythm with atrial bigeminy. In this setting, he was changed to Toprol XL.   Patient has been under significant stress at home with his mother's health, previously at the hospice home, though now discharged back home as well significant financial issues with the [employer]. He is worried he Tom lose his house. In this setting, he began to note intermittent palpitations, mostly at night. This was discussed at his visit on 2/4. However, on 2/8, these palpitations became more noticeable and longer lasting. They continued to be primarily noted at nighttime when laying down and seemed to improve when ambulating. There was some associated chest discomfort when the palpitations were more noticeable. No increase in dyspnea.   He contacted the on-call provider for our group on 2/9 with chest pain following a stressful situation. In this setting, he was brought to [facility] ED where he was noted to be in new onset atrial flutter with RVR with 2:1 AV block. BP initially in the 123456 systolic. He was given IV diltiazem 10 mg x 1 along with IV Ativan 0.5 mg x 1 with spontaneous conversion to sinus rhythm. Labs showed COVID-19 negative, HS-Tn 31 with a delta of 32, BNP 458, potassium 4.4-->4.6, sodium 132-->133, BUN/SCr 11/0.76-->9/0.78, magnesium 2.1, WBC 7.6-->6.0, HGB 10.8-->9.9, PLT 388-->311. CXR showed bilateral pleural effusions improved with moderate residual pleural fluid at the left lung base with associate atelectasis. Upon admission, he was continued on PTA Toprol XL 25 mg and full-dose ASA. This morning around 6:39 AM, he redeveloped atrial flutter with RVR and remains in this rhythm at this time with ventricular rates in the 80s to 120s bpm. He continues to note intermittent palpitations.    Past Medical History:  Diagnosis Date  . Arthritis   . Bone spur 2010   right shoulder  . CAD (coronary artery disease)    a. 12/2019 NSTEMI/Cath: LM 99ost/m, LAD 40, LCX nl, OM1 80, RCA 70ost/p, 99p, 30d, EF 45-50%; b. 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  . Hyperlipidemia   . Hypertension   . IBS (irritable bowel syndrome)   . Ischemic cardiomyopathy    a. 12/2019 TEE: EF 45-50%. Nl RV fxn. Mild MR.    Past Surgical History:  Procedure Laterality Date  .  BACK SURGERY     Lumbar  . BUNIONECTOMY    . CORONARY ARTERY BYPASS GRAFT N/A 01/08/2020   Procedure: CORONARY ARTERY BYPASS GRAFTING (CABG) x 4  WITH ENDOSCOPIC HARVESTING OF BILATERAL GREATER SAPHENOUS VEINS;  Surgeon: Ivin Poot, MD;  Location: [facility];  Service: Open Heart Surgery;  Laterality: N/A;  . HEMORRHOID SURGERY  2004  . INGUINAL HERNIA REPAIR  02/11  . LEFT HEART CATH AND CORONARY ANGIOGRAPHY N/A 01/08/2020   Procedure: LEFT HEART CATH AND  CORONARY ANGIOGRAPHY;  Surgeon: Wellington Hampshire, MD;  Location: [facility];  Service: Cardiovascular;  Laterality: N/A;  . ROTATOR CUFF REPAIR  07/11   left/ bone spur Dr.Blackman  . SHOULDER ARTHROSCOPY WITH OPEN ROTATOR CUFF REPAIR Right 05/26/2018   Procedure: SHOULDER ARTHROSCOPY WITH OPEN ROTATOR CUFF REPAIR;  Surgeon: Corky Mull, MD;  Location: [facility];  Service: Orthopedics;  Laterality: Right;  debridement, decompression  . SKIN CANCER EXCISION  2003-2005   right shoulder  . TEE WITHOUT CARDIOVERSION N/A 01/08/2020   Procedure: TRANSESOPHAGEAL ECHOCARDIOGRAM (TEE);  Surgeon: Prescott Gum, Collier Salina, MD;  Location: [facility];  Service: Open Heart Surgery;  Laterality: N/A;     Home Meds: Prior to Admission medications   Medication Sig Start Date End Date Taking? Authorizing Provider  ezetimibe (ZETIA) 10 MG tablet Take 1 tablet (10 mg total) by mouth daily. 01/25/20 04/24/20 Yes Theora Gianotti, NP  hyoscyamine (ANASPAZ) 0.125 MG TBDP disintergrating tablet Place 0.125 mg under the tongue 2 (two) times daily as needed for bladder spasms.   Yes [provider]  lisinopril (ZESTRIL) 2.5 MG tablet Take 1 tablet (2.5 mg total) by mouth daily. 01/14/20  Yes Gold, Wayne E, PA-C  lovastatin (MEVACOR) 20 MG tablet Take 20 mg by mouth daily.    Yes [provider]  metoprolol succinate (TOPROL XL) 25 MG 24 hr tablet Take 1 tablet (25 mg total) by mouth daily. 01/25/20  Yes Theora Gianotti, NP  Multiple Vitamin (MULTIVITAMIN WITH MINERALS) TABS tablet Take 1 tablet by mouth daily. 01/14/20  Yes Gold, Wayne E, PA-C  Psyllium (METAMUCIL PO) Take 1 Dose by mouth at bedtime. Mix with water   Yes [provider]  aspirin EC 325 MG EC tablet Take 1 tablet (325 mg total) by mouth daily. Patient not taking: Reported on 01/30/2020 01/14/20   John Giovanni, PA-C  ferrous [id number] C-folic acid (TRINSICON / FOLTRIN) capsule Take 1 capsule by mouth 2 (two)  times daily after a meal. Patient not taking: Reported on 01/30/2020 01/14/20   John Giovanni, PA-C    Inpatient Medications: Scheduled Meds: . aspirin  325 mg Oral Daily  . enoxaparin (LOVENOX) injection  40 mg Subcutaneous Q24H  . ezetimibe  10 mg Oral Daily  . lisinopril  2.5 mg Oral Daily  . metoprolol succinate  25 mg Oral Daily  . pravastatin  20 mg Oral q1800  . sodium chloride flush  10 mL Intravenous Q12H   Continuous Infusions:  PRN Meds: acetaminophen, metoprolol tartrate, ondansetron (ZOFRAN) IV  Allergies:   Allergies  Allergen Reactions  . Crestor [Rosuvastatin] Other (See Comments)    Severe myalgias  . Penicillins Rash  . Peanut-Containing Drug Products   . Atorvastatin Other (See Comments)    ache  . Simvastatin Other (See Comments)    aching    Social History:   Social History   Socioeconomic History  . Marital status: Single    Spouse name:  Not on file  . Number of children: 2  . Years of education: Not on file  . Highest education level: Not on file  Occupational History  . Occupation: retired Merchandiser, retail: retired  Tobacco Use  . Smoking status: Never Smoker  . Smokeless tobacco: Never Used  Substance and Sexual Activity  . Alcohol use: Yes    Alcohol/week: 1.0 standard drinks    Types: 1 Glasses of wine per week  . Drug use: No  . Sexual activity: Not on file  Other Topics Concern  . Not on file  Social History Narrative   2 sons, no contact   Splits his time between [place] and [place]   Has live-in since 2005   Has living will   Requests Bernice--girlfriend or brother Herbie [place], as health care POA   Would accept resuscitation but no prolonged artificial ventilation   Wouldn't want tube feeds if cognitively unaware   Social Determinants of Health   Financial Resource Strain:   . Difficulty of Paying Living Expenses: Not on file  Food Insecurity:   . Worried About Charity fundraiser in the Last  Year: Not on file  . Ran Out of Food in the Last Year: Not on file  Transportation Needs:   . Lack of Transportation (Medical): Not on file  . Lack of Transportation (Non-Medical): Not on file  Physical Activity:   . Days of Exercise per Week: Not on file  . Minutes of Exercise per Session: Not on file  Stress:   . Feeling of Stress : Not on file  Social Connections:   . Frequency of Communication with Friends and Family: Not on file  . Frequency of Social Gatherings with Friends and Family: Not on file  . Attends Religious Services: Not on file  . Active Member of Clubs or Organizations: Not on file  . Attends Archivist Meetings: Not on file  . Marital Status: Not on file  Intimate Partner Violence:   . Fear of Current or Ex-Partner: Not on file  . Emotionally Abused: Not on file  . Physically Abused: Not on file  . Sexually Abused: Not on file     Family History:   Family History  Problem Relation Age of Onset  . Heart attack Father   . Heart disease Brother   . Cancer Neg Hx   . Diabetes Neg Hx     ROS:  Review of Systems  Constitutional: Positive for malaise/fatigue. Negative for chills, diaphoresis, fever and weight loss.  HENT: Negative for congestion.   Eyes: Negative for discharge and redness.  Respiratory: Negative for cough, hemoptysis, sputum production, shortness of breath and wheezing.   Cardiovascular: Positive for chest pain and palpitations. Negative for orthopnea, claudication, leg swelling and PND.  Gastrointestinal: Negative for abdominal pain, blood in stool, heartburn, melena, nausea and vomiting.  Genitourinary: Negative for hematuria.  Musculoskeletal: Negative for falls and myalgias.  Skin: Negative for rash.  Neurological: Positive for weakness. Negative for dizziness, tingling, tremors, sensory change, speech change, focal weakness and loss of consciousness.  Endo/Heme/Allergies: Does not bruise/bleed easily.  Psychiatric/Behavioral:  Negative for substance abuse. The patient is not nervous/anxious.   All other systems reviewed and are negative.     Physical Exam/Data:   Vitals:   01/30/20 2205 01/31/20 0429 01/31/20 0500 01/31/20 0740  BP: (!) 129/52 117/69  126/71  Pulse: (!) 45 75  76  Resp: 19 19  18  Temp: 97.8 F (36.6 C) 97.8 F (36.6 C)  98.3 F (36.8 C)  TempSrc: Oral Oral  Oral  SpO2: 99% 100%  100%  Weight:   61.1 kg   Height:        Intake/Output Summary (Last 24 hours) at 01/31/2020 0837 Last data filed at 01/31/2020 0217 Gross per 24 hour  Intake -  Output 1125 ml  Net -1125 ml   Filed Weights   01/30/20 1919 01/30/20 2202 01/31/20 0500  Weight: 61.2 kg 61.1 kg 61.1 kg   Body mass index is 21.74 kg/m.   Physical Exam: General: Well developed, well nourished, in no acute distress. Head: Normocephalic, atraumatic, sclera non-icteric, no xanthomas, nares without discharge.  Neck: Negative for carotid bruits. JVD not elevated. Lungs: Clear bilaterally to auscultation without wheezes, rales, or rhonchi. Breathing is unlabored. Heart: Irregular with S1 S2. No murmurs, rubs, or gallops appreciated. Abdomen: Soft, non-tender, non-distended with normoactive bowel sounds. No hepatomegaly. No rebound/guarding. No obvious abdominal masses. Msk:  Strength and tone appear normal for age. Extremities: No clubbing or cyanosis. No edema. Distal pedal pulses are 2+ and equal bilaterally. Neuro: Alert and oriented X 3. No facial asymmetry. No focal deficit. Moves all extremities spontaneously. Psych:  Responds to questions appropriately with a normal affect.   EKG:  The EKG was personally reviewed and demonstrates: 2/9, 19:21 - atrial flutter with RVR with 2:1 AV block, nonspecific st/t changes. 2/0, 19:39 - atrial flutter with RVR, 126 bpm, variable AV block, nonspecific st/t changes. 2/9, 20:42 - NSR, 76 bpm, nonspecific anterior st/t changes Telemetry:  Telemetry was personally reviewed and  demonstrates: atrial flutter with RVR with variable AV block with ventricular rates ranging from the 90s to 140s bpm that redeveloped from sinus rhythm at 6:39 AM this morning  Weights: Filed Weights   01/30/20 1919 01/30/20 2202 01/31/20 0500  Weight: 61.2 kg 61.1 kg 61.1 kg    Relevant CV Studies: LHC 01/08/2020:  Ost LM to Mid LM lesion is 99% stenosed.  1st Mrg lesion is 80% stenosed.  Mid LAD lesion is 40% stenosed.  LV end diastolic pressure is moderately elevated.  The left ventricular ejection fraction is 45-50% by visual estimate.  There is mild left ventricular systolic dysfunction.  Prox RCA lesion is 99% stenosed.  Ost RCA to Prox RCA lesion is 70% stenosed.  Dist RCA lesion is 30% stenosed.   1.  Critical left main and RCA disease. 2.  Mildly reduced LV systolic function with an EF of 45%. 3.  Moderately elevated left ventricular end-diastolic pressure.  Recommendations:  Recommend emergent CABG.  I discussed with Dr. Prescott Gum and the patient will be transferred to [facility]. __________  Intraoperative TEE 01/08/2020: POST-OP IMPRESSIONS  - Left Ventricle: The left ventricle is unchanged from pre-bypass.  - Aorta: The aorta appears unchanged from pre-bypass.  - Aortic Valve: The aortic valve appears unchanged from pre-bypass.  - Mitral Valve: The mitral valve appears unchanged from pre-bypass.   PRE-OP FINDINGS  Left Ventricle: The left ventricle has mildly reduced systolic function,  with an ejection fraction of 45-50%. The cavity size was mildly dilated.  There is mildly increased left ventricular wall thickness.   Laboratory Data:  Chemistry Recent Labs  Lab 01/25/20 1221 01/30/20 1942 01/31/20 0651  NA 137 132* 133*  K 5.3* 4.4 4.6  CL 102 98 102  CO2 21 23 26   GLUCOSE 91 145* 97  BUN 9 11 9   CREATININE 0.89  0.76 0.78  CALCIUM 8.9 8.9 8.2*  GFRNONAA 82 >60 >60  GFRAA 95 >60 >60  ANIONGAP  --  11 5    Recent Labs  Lab 01/30/20  1942  PROT 7.1  ALBUMIN 3.9  AST 23  ALT 19  ALKPHOS 158*  BILITOT 0.8   Hematology Recent Labs  Lab 01/25/20 1221 01/30/20 1942 01/31/20 0651  WBC 8.6 7.6 6.0  RBC 3.72* 3.75* 3.44*  HGB 10.8* 10.8* 9.9*  HCT 33.7* 33.1* 30.7*  MCV 91 88.3 89.2  MCH 29.0 28.8 28.8  MCHC 32.0 32.6 32.2  RDW 12.7 12.8 12.9  PLT 591* 388 311   Cardiac EnzymesNo results for input(s): TROPONINI in the last 168 hours. No results for input(s): TROPIPOC in the last 168 hours.  BNP Recent Labs  Lab 01/30/20 1942  BNP 458.0*    DDimer No results for input(s): DDIMER in the last 168 hours.  Radiology/Studies:  DG Chest Port 1 View  Result Date: 01/30/2020 IMPRESSION: Bilateral pleural effusions and improved from imaging 3 weeks ago, moderate residual pleural fluid at the left lung base with associated atelectasis. Electronically Signed   By: Keith Rake M.D.   On: 01/30/2020 20:00    Assessment and Plan:   1. New onset atrial flutter with RVR: -He presented to the hospital in atrial flutter with RVR, spontaneously converting to sinus rhythm shortly thereafter. He redeveloped atrial flutter with RVR this morning around 6:39 AM and remains in this rhythm at this time with ventricular rates currently intermittently controlled -Change Toprol XL back to Lopressor 25 mg bid for now for added rate control, titrate as needed -If able, would prefer to consolidate metoprolol back to long acting prior to discharge as he was having difficulty taking a medication BID -CHADS2VASc at least 5 (CHF, HTN, age x 2, vascular disease) -Start heparin gtt -Plan to change to [place] prior to discharge with lower dose ASA given recent CABG -Given his difficulty in taking a medication BID, would prefer Xarelto over Eliquis -If his ventricular rates or rhythm is difficult to control, plan for TEE-guided DCCV, possibly 2/11, prior to discharge -Check TSH -Maintain potassium and magnesium of 4.0 and 2.0, respectively   -Likely in the setting of his recent CABG and exacerbated by significantly family/financial issues   2. CAD involving the native coronary arteries s/p recent CABG without angina/elevated high sensitivity troponin: -Currently, without chest pain -High sensitivity troponin minimally elevated and not consistent with ACS, likely supply demand ischemia in the setting of the above as well as his recent MI with CABG -Ideally, he would have been on ASA 81 mg and Plavix following his CABG, though he declined this change at hospital follow up -Decrease ASA to 81 mg daily with the initiation of heparin gtt as above -No plans for ischemic evaluation at this time  3. HFrEF secondary to ICM: -EF of 45-50% on TEE at time of CABG -He appears relatively euvolemic -Metoprolol as above  -Hold lisinopril as above, resume when able -Look to escalate GDMT as able  4. HTN: -Blood pressure is well controlled -Stop lisinopril at this time for added BP room for rate control -Metoprolol as above  5. HLD: -LDL of 105 from 12/2019 with goal LDL < 70 -He is intolerant to Crestor, Lipitor, and simvastatin -Continue pravastatin and Zetia -Follow up FLP and LFT as an outpatient next month -If his LDL remains above goal at that time, refer to the lipid clinic for consideration of PCSK-9 inhibitor  6. Postoperative anemia: -Monitor    For questions or updates, please contact [place] Please consult [email or web address] for contact info under Cardiology/STEMI.   Signed, Christell Faith, PA-C [place] Pager: [phone] 01/31/2020, 8:37 AM

## 2020-01-31 NOTE — Progress Notes (Signed)
Pt reports that he felt pounding in his chest beginning at 0630.  Cardiac monitor shows atrial flutter. Denies SOB. MD notified via secure chat.

## 2020-02-01 DIAGNOSIS — I483 Typical atrial flutter: Secondary | ICD-10-CM

## 2020-02-01 DIAGNOSIS — I25118 Atherosclerotic heart disease of native coronary artery with other forms of angina pectoris: Secondary | ICD-10-CM

## 2020-02-01 DIAGNOSIS — E782 Mixed hyperlipidemia: Secondary | ICD-10-CM

## 2020-02-01 DIAGNOSIS — I10 Essential (primary) hypertension: Secondary | ICD-10-CM

## 2020-02-01 DIAGNOSIS — Z951 Presence of aortocoronary bypass graft: Secondary | ICD-10-CM

## 2020-02-01 DIAGNOSIS — I255 Ischemic cardiomyopathy: Secondary | ICD-10-CM

## 2020-02-01 LAB — COMPREHENSIVE METABOLIC PANEL
ALT: 17 U/L (ref 0–44)
AST: 17 U/L (ref 15–41)
Albumin: 3.1 g/dL — ABNORMAL LOW (ref 3.5–5.0)
Alkaline Phosphatase: 121 U/L (ref 38–126)
Anion gap: 7 (ref 5–15)
BUN: 13 mg/dL (ref 8–23)
CO2: 25 mmol/L (ref 22–32)
Calcium: 8.6 mg/dL — ABNORMAL LOW (ref 8.9–10.3)
Chloride: 106 mmol/L (ref 98–111)
Creatinine, Ser: 0.81 mg/dL (ref 0.61–1.24)
GFR calc Af Amer: >60 mL/min (ref >60)
GFR calc non Af Amer: >60 mL/min (ref >60)
Glucose, Bld: 103 mg/dL — ABNORMAL HIGH (ref 70–99)
Potassium: 4.3 mmol/L (ref 3.5–5.1)
Sodium: 138 mmol/L (ref 135–145)
Total Bilirubin: 0.9 mg/dL (ref 0.3–1.2)
Total Protein: 5.9 g/dL — ABNORMAL LOW (ref 6.5–8.1)

## 2020-02-01 LAB — CBC
HCT: 31.7 % — ABNORMAL LOW (ref 39.0–52.0)
Hemoglobin: 10.1 g/dL — ABNORMAL LOW (ref 13.0–17.0)
MCH: 28.6 pg (ref 26.0–34.0)
MCHC: 31.9 g/dL (ref 30.0–36.0)
MCV: 89.8 fL (ref 80.0–100.0)
Platelets: 322 10*3/uL (ref 150–400)
RBC: 3.53 MIL/uL — ABNORMAL LOW (ref 4.22–5.81)
RDW: 13 % (ref 11.5–15.5)
WBC: 6.9 10*3/uL (ref 4.0–10.5)
nRBC: 0 % (ref 0.0–0.2)

## 2020-02-01 MED ORDER — RIVAROXABAN 20 MG PO TABS: 20.0000 mg | ORAL_TABLET | Freq: Every day | ORAL | 0 refills | Status: DC

## 2020-02-01 MED ORDER — ASPIRIN 81 MG PO TBEC: 81.0000 mg | DELAYED_RELEASE_TABLET | Freq: Every day | ORAL | 0 refills | Status: AC

## 2020-02-01 MED ORDER — METOPROLOL TARTRATE 25 MG PO TABS: 25.0000 mg | ORAL_TABLET | Freq: Two times a day (BID) | ORAL | Status: DC | PRN

## 2020-02-01 MED ORDER — METOPROLOL SUCCINATE ER 50 MG PO TB24: 50.0000 mg | ORAL_TABLET | Freq: Every day | ORAL | 0 refills | Status: DC

## 2020-02-01 MED ORDER — METOPROLOL SUCCINATE ER 50 MG PO TB24
50.0000 mg | ORAL_TABLET | Freq: Every day | ORAL | Status: DC
  Administered 2020-02-01: 50 mg via ORAL
  Filled 2020-02-01: qty 1

## 2020-02-01 NOTE — Progress Notes (Signed)
Progress Note  Patient Name: [name] Date of Encounter: 02/01/2020  Primary Cardiologist: Agbor-Etang  Subjective   No chest pain, palpitations, or SOB. He has been maintaining sinus rhtyhm since ~ 15:00 on 2/10. Potassium at goal. HGB stable. Tolerating Xarelto.   Inpatient Medications    Scheduled Meds: . aspirin EC  81 mg Oral Daily  . docusate sodium  100 mg Oral Daily  . ezetimibe  10 mg Oral Daily  . metoprolol tartrate  25 mg Oral BID  . pravastatin  20 mg Oral q1800  . rivaroxaban  20 mg Oral Daily  . sodium chloride flush  10 mL Intravenous Q12H   Continuous Infusions:  PRN Meds: acetaminophen, metoprolol tartrate, ondansetron (ZOFRAN) IV   Vital Signs    Vitals:   01/31/20 1628 01/31/20 2020 02/01/20 0457 02/01/20 0737  BP: (!) 117/58 (!) 121/57 126/67 132/82  Pulse: 71 73 80 73  Resp: 18  20 20   Temp: 98.1 F (36.7 C) 98.6 F (37 C) 98.2 F (36.8 C) 98.5 F (36.9 C)  TempSrc: Oral Oral Oral Oral  SpO2: 97% 98% 96% 98%  Weight:   59.3 kg   Height:        Intake/Output Summary (Last 24 hours) at 02/01/2020 0939 Last data filed at 01/31/2020 1800 Gross per 24 hour  Intake 240 ml  Output 700 ml  Net -460 ml   Filed Weights   01/30/20 2202 01/31/20 0500 02/01/20 0457  Weight: 61.1 kg 61.1 kg 59.3 kg    Telemetry    Maintaining SR since ~ 15:00 on 2/10 - Personally Reviewed  ECG    01/31/2020: NSR, 73 bpm, possible prior septal infarct, nonspecific anterolateral st/t changes, largely unchanged from prior  - Personally Reviewed  Physical Exam   GEN: No acute distress.   Neck: No JVD. Cardiac: RRR, no murmurs, rubs, or gallops.  Respiratory: Clear to auscultation bilaterally.  GI: Soft, nontender, non-distended.   MS: No edema; No deformity. Neuro:  Alert and oriented x 3; Nonfocal.  Psych: Normal affect.  Labs    Chemistry Recent Labs  Lab 01/30/20 1942 01/31/20 0651 02/01/20 0418  NA 132* 133* 138  K 4.4 4.6 4.3  CL 98  102 106  CO2 23 26 25   GLUCOSE 145* 97 103*  BUN 11 9 13   CREATININE 0.76 0.78 0.81  CALCIUM 8.9 8.2* 8.6*  PROT 7.1  --  5.9*  ALBUMIN 3.9  --  3.1*  AST 23  --  17  ALT 19  --  17  ALKPHOS 158*  --  121  BILITOT 0.8  --  0.9  GFRNONAA >60 >60 >60  GFRAA >60 >60 >60  ANIONGAP 11 5 7      Hematology Recent Labs  Lab 01/30/20 1942 01/31/20 0651 02/01/20 0418  WBC 7.6 6.0 6.9  RBC 3.75* 3.44* 3.53*  HGB 10.8* 9.9* 10.1*  HCT 33.1* 30.7* 31.7*  MCV 88.3 89.2 89.8  MCH 28.8 28.8 28.6  MCHC 32.6 32.2 31.9  RDW 12.8 12.9 13.0  PLT 388 311 322    Cardiac EnzymesNo results for input(s): TROPONINI in the last 168 hours. No results for input(s): TROPIPOC in the last 168 hours.   BNP Recent Labs  Lab 01/30/20 1942  BNP 458.0*     DDimer No results for input(s): DDIMER in the last 168 hours.   Radiology    DG Chest Port 1 View  Result Date: 01/30/2020 IMPRESSION: Bilateral pleural effusions and  improved from imaging 3 weeks ago, moderate residual pleural fluid at the left lung base with associated atelectasis. Electronically Signed   By: Keith Rake M.D.   On: 01/30/2020 20:00    Cardiac Studies   LHC 01/08/2020:  Ost LM to Mid LM lesion is 99% stenosed.  1st Mrg lesion is 80% stenosed.  Mid LAD lesion is 40% stenosed.  LV end diastolic pressure is moderately elevated.  The left ventricular ejection fraction is 45-50% by visual estimate.  There is mild left ventricular systolic dysfunction.  Prox RCA lesion is 99% stenosed.  Ost RCA to Prox RCA lesion is 70% stenosed.  Dist RCA lesion is 30% stenosed.  1. Critical left main and RCA disease. 2. Mildly reduced LV systolic function with an EF of 45%. 3. Moderately elevated left ventricular end-diastolic pressure.  Recommendations:  Recommend emergent CABG. I discussed with Dr. Prescott Gum and the patient will be transferred to [facility]. __________  Intraoperative TEE 01/08/2020: POST-OP  IMPRESSIONS  - Left Ventricle: The left ventricle is unchanged from pre-bypass.  - Aorta: The aorta appears unchanged from pre-bypass.  - Aortic Valve: The aortic valve appears unchanged from pre-bypass.  - Mitral Valve: The mitral valve appears unchanged from pre-bypass.   PRE-OP FINDINGS  Left Ventricle: The left ventricle has mildly reduced systolic function,  with an ejection fraction of 45-50%. The cavity size was mildly dilated.  There is mildly increased left ventricular wall thickness.   Patient Profile     79 y.o. male with history of CAD with recent NSTEMI s/p recent 4-vessel CABG on 01/08/2020, HFrEF secondary to ICM, HTN, HLD with high and medium intensity statin intolerance, arthritis, and IBS who is being seen today for the evaluation of new onset atrial flutter with RVR at the request of Dr. Posey Pronto.  Assessment & Plan    1. New onset atrial flutter with RVR: -He presented to the hospital in atrial flutter with RVR, spontaneously converting to sinus rhythm shortly thereafter. He redeveloped atrial flutter with RVR on the morning of 2/10 with subsequent repeat spontaneous conversion to sinus rhythm. He has been maintaining sinus rhythm since ~ 15:00 on 2/10  -Consolidate Lopressor back to Toprol XL given his cardiomyopathy and prior noted issues with taking a bid medication -Plan for Zio patch in outpatient follow up to evaluate for continued break through episodes -If he is found to have further episodes of flutter, plan to refer to EP for consideration of RFCA -Given his history of redeveloped flutter, would not DCCV at this time unless he is unstable or proves to have difficult to control rates -CHADS2VASc at least 5 (CHF, HTN, age x 2, vascular disease) -Continue Xarelto (not on Eliquis given difficulty with bid medication) -TSH normal -Maintain potassium and magnesium of 4.0 and 2.0, respectively  -Likely in the setting of his recent CABG and exacerbated by significantly  family/financial issues   2. CAD involving the native coronary arteries s/p recent CABG without angina/elevated high sensitivity troponin: -Currently, without chest pain -High sensitivity troponin minimally elevated and not consistent with ACS, likely supply demand ischemia in the setting of the above as well as his recent MI with CABG -Ideally, he would have been on ASA 81 mg and Plavix following his CABG, though he declined this change at hospital follow up -Continue reduced dose ASA to 81 mg daily with the initiation of DOAC as above -No plans for ischemic evaluation at this time  3. HFrEF secondary to ICM: -EF of  45-50% on TEE at time of CABG -He appears relatively euvolemic -Metoprolol as above  -Hold lisinopril as above, resume when able -Look to escalate GDMT as able  4. HTN: -Blood pressure is well controlled -Continue to hold lisinopril at this time for added BP room for rate control -Metoprolol as above  5. HLD: -LDL of 105 from 12/2019 with goal LDL < 70 -He is intolerant to Crestor, Lipitor, and simvastatin -Continue pravastatin and Zetia -Follow up FLP and LFT as an outpatient next month -If his LDL remains above goal at that time, refer to the lipid clinic for consideration of PCSK-9 inhibitor   6. Postoperative anemia: -Stable -Monitor   For questions or updates, please contact [place] Please consult [email or web address] for contact info under Cardiology/STEMI.    Signed, Christell Faith, PA-C [place] Pager: [phone] 02/01/2020, 9:39 AM

## 2020-02-01 NOTE — Plan of Care (Signed)
  Problem: Education: Goal: Knowledge of General Education information will improve Description: Including pain rating scale, medication(s)/side effects and non-pharmacologic comfort measures Outcome: Progressing   Problem: Health Behavior/Discharge Planning: Goal: Ability to manage health-related needs will improve Outcome: Progressing   Problem: Clinical Measurements: Goal: Ability to maintain clinical measurements within normal limits will improve Outcome: Progressing Goal: Cardiovascular complication will be avoided Outcome: Progressing   Problem: Activity: Goal: Ability to tolerate increased activity will improve Outcome: Progressing   Problem: Cardiac: Goal: Ability to achieve and maintain adequate cardiopulmonary perfusion will improve Outcome: Progressing

## 2020-02-01 NOTE — Progress Notes (Signed)
Assessed patient following episode of 4/10 chest pain. Patient indicated he got cold while ambulating in the hallway. He states, " I do not like the cold." With this, he began to shiver and developed a pinpoint area of chest pain on the upper outer left anterior chest wall that is reproducible to palpation. Patient went back to his room and got under covers to warm up leading to resolution of his pain. He has remained in sinus rhythm today, even with ambulation. Vital signs stable. Currently pain free and wants to go home. He is ok for discharge.

## 2020-02-01 NOTE — Discharge Summary (Signed)
Physician Discharge Summary  [name] DOB: 16-Jun-1941 DOA: 01/30/2020  PCP: Venia Carbon, MD  Admit date: 01/30/2020 Discharge date: 02/01/2020  Admitted From: home  Disposition:  home  Recommendations for Outpatient Follow-up:  1. Follow up with PCP in 1-2 weeks 2. F/u cardio (Dr. Garen Lah) on 02/22/20 at [facility]: no  Equipment/Devices:   Discharge Condition: stable CODE STATUS: full  Diet recommendation: Heart Healthy  Brief/Interim Summary: HPI was taken from Dr. Zada Finders: [name] is a 79 y.o. 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  Patient was recently admitted at [facility] for NSTEMI and underwent CABG x4 on 01/08/2020.  He was discharged on 01/15/2020.  Patient states that since then he has been feeling well until yesterday 01/29/2020 afternoon.  He says he was helping his brother doing kitchen work/cooking.  Afterwards he felt a substernal chest pain which was intermittent and without radiation to his neck, jaw, back, or arms.  He felt as if he might of pulled a muscle.  He says this pain was different than when he was last admitted, at that time he did not have pain but only chest pressure.  He says last night when he went to his sleep he began to feel like his heart was racing.  When he woke up this morning he again had intermittent palpitations and substernal chest pain.  He came to the ED for further evaluation.  He denies any associated subjective fevers, diaphoresis, chills, nausea, vomiting, lightheadedness, dizziness, syncope, abdominal pain, diarrhea, or swelling in his feet or legs.  Of note he was recently planned to switch from twice daily metoprolol to Toprol-XL 25 mg once daily by his cardiologist as he admits that he sometimes misses doses here and there.   He says he has been continuing to take the twice daily metoprolol until he has used it all up and then plans to switch to the Toprol-XL as prescribed.  He says he is still missing occasional doses but feels that he did take it last night.  ED Course:  Initial vitals show BP 165/87, pulse 69, RR 20, SPO2 98% on room air.  Labs are notable for sodium 132, potassium 4.4, bicarb 23, BUN 11, creatinine 0.76, WBC 7.6, hemoglobin 10.8, platelets 388,000, high-sensitivity troponin I 31.  SARS-CoV-2 PCR is collected and pending.  Portable chest x-ray showed post sternotomy and CABG changes with improving bilateral pleural effusions.  Patient was given IV diltiazem 10 mg once and IV Ativan 0.5 mg once.  Rate improved with apparent conversion to sinus rhythm.  EDP discussed the case with on-call cardiology who recommended observation overnight and that if patient returns to prolonged atrial flutter/fibrillation would need to heparinize.  The hospitalist service was consulted to admit for further evaluation and management.  Hospital Course from Dr. Lenise Herald 2/10-2/11/21: Pt was found to have paroxysmal atrial flutter and pt was started on xarelto & pt's metoprolol dose was increased to 50mg . Pt's rate was controlled and less than 100 prior to d/c even w/ exertion.   Discharge Diagnoses:  Principal Problem:   Paroxysmal atrial flutter (HCC) Active Problems:   Hypertension   Hyperlipidemia   S/P CABG x 4   CAD (coronary artery disease)   Ischemic cardiomyopathy   Atrial flutter (HCC)   Paroxysmal atrial flutter: CHA2DS2-VASc score is  4-5. Continue on metoprolol, aspirin, xarelto as per cardio   CAD: s/p CABG x4 on 01/08/2020. High-sensitivity troponin I minimally elevated at 31. Continue aspirin, metoprolol, lovastatin, Zetia. Cardio following and recs apprec   Ischemic cardiomyopathy: EF 45-50%.  Euvolemic currently.  Continue metoprolol, lovastatin, Zetia, lisinopril.  Hypertension:  continue metoprolol and lisinopril.  Hyperlipidemia: continue lovastatin, Zetia.  Normocytic anemia: no need for a transfusion at this time. Will continue to monitor H&H  Elevated alkaline phosphate: resolved    Discharge Instructions  Discharge Instructions    Amb referral to [facility]   Complete by: As directed    Diet - low sodium heart healthy   Complete by: As directed    Discharge instructions   Complete by: As directed    F/U PCP in 1-2 weeks; F/u cardio w/in 1 week   Increase activity slowly   Complete by: As directed      Allergies as of 02/01/2020      Reactions   Crestor [rosuvastatin] Other (See Comments)   Severe myalgias   Penicillins Rash   Peanut-containing Drug Products    Atorvastatin Other (See Comments)   ache   Simvastatin Other (See Comments)   aching      Medication List    TAKE these medications   aspirin 81 MG EC tablet Take 1 tablet (81 mg total) by mouth daily. Start taking on: February 02, 2020 What changed:   medication strength  how much to take   ezetimibe 10 MG tablet Commonly known as: ZETIA Take 1 tablet (10 mg total) by mouth daily.   ferrous [id number] C-folic acid capsule Commonly known as: TRINSICON / FOLTRIN Take 1 capsule by mouth 2 (two) times daily after a meal.   hyoscyamine 0.125 MG Tbdp disintergrating tablet Commonly known as: ANASPAZ Place 0.125 mg under the tongue 2 (two) times daily as needed for bladder spasms.   lisinopril 2.5 MG tablet Commonly known as: ZESTRIL Take 1 tablet (2.5 mg total) by mouth daily.   lovastatin 20 MG tablet Commonly known as: MEVACOR Take 20 mg by mouth daily.   METAMUCIL PO Take 1 Dose by mouth at bedtime. Mix with water   metoprolol succinate 50 MG 24 hr tablet Commonly known as: TOPROL-XL Take 1 tablet (50 mg total) by mouth daily. Take with or immediately following a meal. Start taking on: February 02, 2020 What changed:   medication strength  how  much to take  additional instructions   multivitamin with minerals Tabs tablet Take 1 tablet by mouth daily.   rivaroxaban 20 MG Tabs tablet Commonly known as: XARELTO Take 1 tablet (20 mg total) by mouth daily. Start taking on: February 02, 2020      Follow-up Information    Agbor-Etang, Aaron Edelman, MD Follow up in 2 week(s).   Specialties: Cardiology, Radiology Contact information: [address] [place] [address] [phone]          Allergies  Allergen Reactions  . Crestor [Rosuvastatin] Other (See Comments)    Severe myalgias  . Penicillins Rash  . Peanut-Containing Drug Products   . Atorvastatin Other (See Comments)    ache  . Simvastatin Other (See Comments)    aching    Consultations:  Cardio, Dr. Garen Lah   Procedures/Studies: DG Chest 2 View  Result Date: 01/12/2020 CLINICAL DATA:  CABG. EXAM: CHEST - 2 VIEW COMPARISON:  01/11/2020.  CT 01/07/2020. 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 Heart size normal. No pulmonary venous  congestion. Persistent but improved bilateral atelectatic changes/infiltrates. Small bilateral pleural effusions. No pneumothorax. IMPRESSION: 1. Interim removal of right IJ line. 2. Prior CABG. Heart size normal. No pulmonary venous congestion. 3. Persistent but improved bilateral atelectatic changes/infiltrates. Small bilateral pleural effusions. Electronically Signed   By: Marcello Moores  Register   On: 01/12/2020 06:26   CT Angio Chest PE W and/or Wo Contrast  Result Date: 01/07/2020 CLINICAL DATA:  Short of breath. EXAM: CT ANGIOGRAPHY CHEST WITH CONTRAST TECHNIQUE: Multidetector CT imaging of the chest was performed using the standard protocol during bolus administration of intravenous contrast. Multiplanar CT image reconstructions and MIPs were obtained to evaluate the vascular anatomy. CONTRAST:  18mL OMNIPAQUE IOHEXOL 350 MG/ML SOLN COMPARISON:  None. FINDINGS: Cardiovascular: No filling defects within the  pulmonary arteries arteries to suggest acute pulmonary embolism. Coronary artery calcification and aortic atherosclerotic calcification. Mediastinum/Nodes: No axillary supraclavicular adenopathy. No discrete enlarged mediastinal lymph nodes. Lungs/Pleura: Large bilateral layering pleural effusions. Mild ground-glass perihilar opacities in the LEFT and RIGHT upper lobe. Mild basilar atelectasis related to the large effusions. Pulmonary infarction. Upper Abdomen: Limited view of the liver, kidneys, pancreas are unremarkable. Normal adrenal glands. Musculoskeletal: No aggressive osseous lesion Review of the MIP images confirms the above findings. IMPRESSION: 1. No acute pulmonary embolism. 2. Large bilateral layering pleural effusions. 3. Ground-glass opacities in the upper lobes likely represent mild edema. 4. Coronary artery calcification and Aortic Atherosclerosis (ICD10-I70.0). Electronically Signed   By: Suzy Bouchard M.D.   On: 01/07/2020 11:38   CARDIAC CATHETERIZATION  Result Date: 01/08/2020  Ost LM to Mid LM lesion is 99% stenosed.  1st Mrg lesion is 80% stenosed.  Mid LAD lesion is 40% stenosed.  LV end diastolic pressure is moderately elevated.  The left ventricular ejection fraction is 45-50% by visual estimate.  There is mild left ventricular systolic dysfunction.  Prox RCA lesion is 99% stenosed.  Ost RCA to Prox RCA lesion is 70% stenosed.  Dist RCA lesion is 30% stenosed.  1.  Critical left main and RCA disease. 2.  Mildly reduced LV systolic function with an EF of 45%. 3.  Moderately elevated left ventricular end-diastolic pressure. Recommendations: Recommend emergent CABG.  I discussed with Dr. Prescott Gum and the patient will be transferred to [facility].   DG Chest Port 1 View  Result Date: 01/30/2020 CLINICAL DATA:  Chest pain. EXAM: PORTABLE CHEST 1 VIEW COMPARISON:  Radiograph 01/12/2020. CT 01/07/2020 FINDINGS: Post median sternotomy and CABG. Mild cardiomegaly. Bilateral  pleural effusions have improved, greatest residual at the left lung base. Associated bibasilar opacities likely atelectasis. No septal thickening or definite pulmonary edema. No pneumothorax. No acute osseous abnormalities are seen. IMPRESSION: Bilateral pleural effusions and improved from imaging 3 weeks ago, moderate residual pleural fluid at the left lung base with associated atelectasis. Electronically Signed   By: Keith Rake M.D.   On: 01/30/2020 20:00   DG Chest Port 1 View  Result Date: 01/11/2020 CLINICAL DATA:  History of CABG. EXAM: PORTABLE CHEST 1 VIEW COMPARISON:  01/10/2020. FINDINGS: Interim removal of bilateral chest tubes. Right IJ sheath in stable position. Prior CABG. Cardiomegaly. Low lung volumes with bibasilar atelectasis. Bilateral pulmonary infiltrates/edema noted on today's exam. Small left pleural effusion. CHF should be considered. No pneumothorax. IMPRESSION: 1. Interim removal of bilateral chest tubes. No pneumothorax. Right IJ sheath in stable position. 2. Prior CABG. Cardiomegaly again noted. Diffuse bilateral pulmonary infiltrates/edema. Small left pleural effusion. CHF should be considered. 3.  Low lung volumes with bibasilar  atelectasis. Electronically Signed   By: Marcello Moores  Register   On: 01/11/2020 07:11   DG Chest Port 1 View  Result Date: 01/10/2020 CLINICAL DATA:  79 year old male with history of myocardial infarction status post CABG. EXAM: PORTABLE CHEST 1 VIEW COMPARISON:  Chest x-ray 01/09/2020. FINDINGS: Previously noted Swan-Ganz catheter has been removed. Right IJ Cordis remains stable in position with tip proximal superior vena cava. Bilateral chest tubes are stable in position with tips and side ports projecting over the thorax. Epicardial pacing wires are noted. Lung volumes are low. Bibasilar opacities (left greater than right) likely reflect resolving postoperative subsegmental atelectasis. Small left pleural effusion. No definite right pleural effusion.  No appreciable pneumothorax. No evidence of pulmonary edema. Heart size is upper limits of normal. Upper mediastinal contours are within normal limits. IMPRESSION: 1. Postoperative changes and support apparatus, as above. 2. Low lung volumes with persistent bibasilar (left greater than right) postoperative subsegmental atelectasis with small left pleural effusion. Electronically Signed   By: Vinnie Langton M.D.   On: 01/10/2020 08:23   DG Chest Port 1 View  Result Date: 01/09/2020 CLINICAL DATA:  CABG yesterday. EXAM: PORTABLE CHEST 1 VIEW COMPARISON:  One-view chest x-ray 01/08/2020 FINDINGS: The patient is now status post CABG. 8 mediastinal wires are intact. No endotracheal tube is present. A Swan-Ganz catheter terminates in the right main pulmonary outflow tract. Bilateral chest tubes and mediastinal drain are in place. There is no pneumothorax. Lung volumes are low. Left greater than right pleural effusions and basilar airspace opacities are noted. IMPRESSION: 1. Status post CABG without radiographic evidence for complication. 2. Low lung volumes with left greater than right pleural effusions and basilar airspace disease, likely atelectasis. Electronically Signed   By: San Morelle M.D.   On: 01/09/2020 06:32   DG Chest Port 1 View  Result Date: 01/08/2020 CLINICAL DATA:  Status post CABG. EXAM: PORTABLE CHEST 1 VIEW COMPARISON:  Yesterday. FINDINGS: Interval post CABG changes with mediastinal and bilateral chest tubes. Endotracheal tube in satisfactory position. Right jugular Swan-Ganz catheter tip in distal right main pulmonary artery or proximal intersegmental pulmonary artery. Nasogastric tube extending into the distal stomach. Decreased inspiration with no gross change in diffuse prominence of the interstitial markings. Minimal right basilar atelectasis. No pneumothorax. Thoracolumbar spine degenerative changes. IMPRESSION: 1. Decreased inspiration with minimal right basilar atelectasis.  2. Stable mild chronic interstitial lung disease. 3. Interval CABG. Electronically Signed   By: Claudie Revering M.D.   On: 01/08/2020 19:41   DG Chest Portable 1 View  Result Date: 01/07/2020 CLINICAL DATA:  Shortness of breath, hemoptysis EXAM: PORTABLE CHEST 1 VIEW COMPARISON:  None. FINDINGS: The heart size and mediastinal contours are within normal limits. Prominent interstitial markings throughout both lungs. Small right pleural effusion. No pneumothorax. Degenerative changes of the thoracic spine. IMPRESSION: 1. Prominent interstitial markings throughout both lungs. Findings Magill reflect chronic bronchitic type lung changes and/or an underlying chronic interstitial lung disease. 2. Small right pleural effusion. Electronically Signed   By: Davina Poke D.O.   On: 01/07/2020 10:50   ECHO INTRAOPERATIVE TEE  Result Date: 01/12/2020  *INTRAOPERATIVE TRANSESOPHAGEAL REPORT *  Patient Name:   [name]  Date of Exam: 01/08/2020 Medical Rec #:  [id number]      Height:       67.0 in Accession #:    [id number]     Weight:       140.2 lb Date of Birth:  06-26-1941      BSA:  1.74 m Patient Age:    79 years       BP:           138/58 mmHg Patient Gender: M              HR:           69 bpm. Exam Location:  Anesthesiology Transesophogeal exam was perform intraoperatively during surgical procedure. Patient was closely monitored under general anesthesia during the entirety of examination. Indications:     CAD Performing Phys: [facility] Diagnosing Phys: Lillia Abed MD Complications: No known complications during this procedure. POST-OP IMPRESSIONS - Left Ventricle: The left ventricle is unchanged from pre-bypass. - Aorta: The aorta appears unchanged from pre-bypass. - Aortic Valve: The aortic valve appears unchanged from pre-bypass. - Mitral Valve: The mitral valve appears unchanged from pre-bypass. PRE-OP FINDINGS  Left Ventricle: The left ventricle has mildly reduced systolic function, with an  ejection fraction of 45-50%. The cavity size was mildly dilated. There is mildly increased left ventricular wall thickness. Right Ventricle: The right ventricle has normal systolic function. The cavity was normal. There is no increase in right ventricular wall thickness. Left Atrium: Left atrial size was normal in size. The left atrial appendage is well visualized and there is no evidence of thrombus present. Right Atrium: Right atrial size was normal in size. Right atrial pressure is estimated at 10 mmHg. Interatrial Septum: No atrial level shunt detected by color flow Doppler. Pericardium: There is no evidence of pericardial effusion. Mitral Valve: The mitral valve is normal in structure. No thickening of the mitral valve leaflet. No calcification of the mitral valve leaflet. Mitral valve regurgitation is mild by color flow Doppler. The MR jet is centrally-directed. Tricuspid Valve: The tricuspid valve was normal in structure. Tricuspid valve regurgitation was not visualized by color flow Doppler. Aortic Valve: The aortic valve is normal in structure. Aortic valve regurgitation is trivial by color flow Doppler. Pulmonic Valve: The pulmonic valve was not assessed. Pulmonic valve regurgitation was not assessed by color flow Doppler. Aorta: The aortic root is normal in size and structure.  Lillia Abed MD Electronically signed by Lillia Abed MD Signature Date/Time: 01/12/2020/7:25:48 AM    Final    VAS US DOPPLER PRE CABG  Result Date: 01/08/2020 PREOPERATIVE VASCULAR EVALUATION  Indications:      Pre-CABG. Risk Factors:     Hypertension, hyperlipidemia. Comparison Study: No prior study. Performing Technologist: Maudry Mayhew MHA, RDMS, RVT, RDCS  Examination Guidelines: A complete evaluation includes B-mode imaging, spectral Doppler, color Doppler, and power Doppler as needed of all accessible portions of each vessel. Bilateral testing is considered an integral part of a complete examination. Limited  examinations for reoccurring indications Dea be performed as noted.  Right Carotid Findings: +----------+--------+-------+--------+--------------------------------+--------+           PSV cm/sEDV    StenosisDescribe                        Comments                   cm/s                                                    +----------+--------+-------+--------+--------------------------------+--------+ CCA Prox  112     12                                                      +----------+--------+-------+--------+--------------------------------+--------+  CCA Distal116     13             smooth and heterogenous                  +----------+--------+-------+--------+--------------------------------+--------+ ICA Prox  59      7              smooth, heterogenous and                                                  calcific                                 +----------+--------+-------+--------+--------------------------------+--------+ ICA Distal96      16                                                      +----------+--------+-------+--------+--------------------------------+--------+ ECA       87      3                                                       +----------+--------+-------+--------+--------------------------------+--------+ Portions of this table do not appear on this page. +----------+--------+-------+----------------+------------+           PSV cm/sEDV cmsDescribe        Arm Pressure +----------+--------+-------+----------------+------------+ [id number]             Multiphasic, WNL             +----------+--------+-------+----------------+------------+ +---------+--------+--+--------+-+---------+ VertebralPSV cm/s56EDV cm/s7Antegrade +---------+--------+--+--------+-+---------+ Left Carotid Findings: +----------+-------+-------+--------+---------------------------------+--------+           PSV    EDV    StenosisDescribe                          Comments           cm/s   cm/s                                                     +----------+-------+-------+--------+---------------------------------+--------+ CCA Prox  92     13             smooth and heterogenous                   +----------+-------+-------+--------+---------------------------------+--------+ CCA Distal138    19             smooth and heterogenous                   +----------+-------+-------+--------+---------------------------------+--------+ ICA Prox  100    14             smooth and heterogenous                   +----------+-------+-------+--------+---------------------------------+--------+ ICA Distal71     19                                                       +----------+-------+-------+--------+---------------------------------+--------+  ECA       207    14             heterogenous, irregular and                                               calcific                                  +----------+-------+-------+--------+---------------------------------+--------+ +----------+--------+--------+----------------+------------+ SubclavianPSV cm/sEDV cm/sDescribe        Arm Pressure +----------+--------+--------+----------------+------------+           137             Multiphasic, WNL130          +----------+--------+--------+----------------+------------+ +---------+--------+--+--------+--+---------+ VertebralPSV cm/s75EDV cm/s17Antegrade +---------+--------+--+--------+--+---------+  ABI Findings: +--------+------------------+-----+--------+--------+ Right   Rt Pressure (mmHg)IndexWaveformComment  +--------+------------------+-----+--------+--------+ Brachial                       biphasic         +--------+------------------+-----+--------+--------+ +--------+------------------+-----+--------+-------+ Left    Lt Pressure (mmHg)IndexWaveformComment  +--------+------------------+-----+--------+-------+ Brachial130                    biphasic        +--------+------------------+-----+--------+-------+  Right Doppler Findings: +-----------+--------+-----+--------+------------------------------------------+ Site       PressureIndexDoppler Comments                                   +-----------+--------+-----+--------+------------------------------------------+ Brachial                biphasic                                           +-----------+--------+-----+--------+------------------------------------------+ Radial                  biphasic                                           +-----------+--------+-----+--------+------------------------------------------+ Ulnar                   biphasic                                           +-----------+--------+-----+--------+------------------------------------------+ Palmar Arch                     Unable to evaluate due to recent                                           catheterization                            +-----------+--------+-----+--------+------------------------------------------+  Left Doppler Findings: +--------+--------+-----+--------+--------+ Site    PressureIndexDoppler Comments +--------+--------+-----+--------+--------+ [id number]  biphasic         +--------+--------+-----+--------+--------+ Radial               biphasic         +--------+--------+-----+--------+--------+ Ulnar                biphasic         +--------+--------+-----+--------+--------+  Summary: Right Carotid: Velocities in the right ICA are consistent with a 1-39% stenosis. Left Carotid: Velocities in the left ICA are consistent with a 1-39% stenosis. Vertebrals:  Bilateral vertebral arteries demonstrate antegrade flow. Subclavians: Normal flow hemodynamics were seen in bilateral subclavian              arteries. Left Upper Extremity: Doppler waveform  obliterate with left radial compression. Doppler waveforms remain within normal limits with left ulnar compression.  Electronically signed by Ruta Hinds MD on 01/08/2020 at 2:52:54 PM.    Final        Subjective: Pt c/o fatigue  Discharge Exam: Vitals:   02/01/20 0737 02/01/20 1141  BP: 132/82 (!) 142/67  Pulse: 73 70  Resp: 20 17  Temp: 98.5 F (36.9 C) 98.1 F (36.7 C)  SpO2: 98% 100%   Vitals:   01/31/20 2020 02/01/20 0457 02/01/20 0737 02/01/20 1141  BP: (!) 121/57 126/67 132/82 (!) 142/67  Pulse: 73 80 73 70  Resp:  20 20 17   Temp: 98.6 F (37 C) 98.2 F (36.8 C) 98.5 F (36.9 C) 98.1 F (36.7 C)  TempSrc: Oral Oral Oral Oral  SpO2: 98% 96% 98% 100%  Weight:  59.3 kg    Height:        General: Pt is alert, awake, not in acute distress Cardiovascular:  Irregularly irregular, no rubs, no gallops Respiratory: CTA bilaterally, no wheezing, no rhonchi Abdominal: Soft, NT, ND, bowel sounds + Extremities: no edema, no cyanosis    The results of significant diagnostics from this hospitalization (including imaging, microbiology, ancillary and laboratory) are listed below for reference.     Microbiology: Recent Results (from the past 240 hour(s))  SARS CORONAVIRUS 2 (TAT 6-24 HRS) Nasopharyngeal Nasopharyngeal Swab     Status: None   Collection Time: 01/30/20  9:38 PM   Specimen: Nasopharyngeal Swab  Result Value Ref Range Status   SARS Coronavirus 2 NEGATIVE NEGATIVE Final    Comment: (NOTE) SARS-CoV-2 target nucleic acids are NOT DETECTED. The SARS-CoV-2 RNA is generally detectable in upper and lower respiratory specimens during the acute phase of infection. Negative results do not preclude SARS-CoV-2 infection, do not rule out co-infections with other pathogens, and should not be used as the sole basis for treatment or other patient management decisions. Negative results must be combined with clinical observations, patient history, and epidemiological  information. The expected result is Negative. Fact Sheet for Patients: SugarRoll.be Fact Sheet for Healthcare Providers: https://www.woods-mathews.com/ This test is not yet approved or cleared by the [place] FDA and  has been authorized for detection and/or diagnosis of SARS-CoV-2 by FDA under an Emergency Use Authorization (EUA). This EUA will remain  in effect (meaning this test can be used) for the duration of the COVID-19 declaration under Section 56 4(b)(1) of the Act, 21 U.S.C. section 360bbb-3(b)(1), unless the authorization is terminated or revoked sooner. Performed at [facility], [place] [address], [place], [place] [address]      Labs: BNP (last 3 results) Recent Labs    01/30/20 1942  BNP [id number]*   Basic Metabolic Panel: Recent Labs  Lab 01/30/20 1942 01/30/20 2121 01/31/20 0651 02/01/20 0418  NA 132*  --  133* 138  K 4.4  --  4.6 4.3  CL 98  --  102 106  CO2 23  --  26 25  GLUCOSE 145*  --  97 103*  BUN 11  --  9 13  CREATININE 0.76  --  0.78 0.81  CALCIUM 8.9  --  8.2* 8.6*  MG  --  2.1  --   --    Liver Function Tests: Recent Labs  Lab 01/30/20 1942 02/01/20 0418  AST 23 17  ALT 19 17  ALKPHOS 158* 121  BILITOT 0.8 0.9  PROT 7.1 5.9*  ALBUMIN 3.9 3.1*   No results for input(s): LIPASE, AMYLASE in the last 168 hours. No results for input(s): AMMONIA in the last 168 hours. CBC: Recent Labs  Lab 01/30/20 1942 01/31/20 0651 02/01/20 0418  WBC 7.6 6.0 6.9  NEUTROABS 4.0  --   --   HGB 10.8* 9.9* 10.1*  HCT 33.1* 30.7* 31.7*  MCV 88.3 89.2 89.8  PLT 388 311 322   Cardiac Enzymes: No results for input(s): CKTOTAL, CKMB, CKMBINDEX, TROPONINI in the last 168 hours. BNP: Invalid input(s): POCBNP CBG: No results for input(s): GLUCAP in the last 168 hours. D-Dimer No results for input(s): DDIMER in the last 72 hours. Hgb A1c No results for input(s): HGBA1C in the last 72 hours. Lipid  Profile No results for input(s): CHOL, HDL, LDLCALC, TRIG, CHOLHDL, LDLDIRECT in the last 72 hours. Thyroid function studies Recent Labs    01/31/20 0651  TSH 1.929   Anemia work up No results for input(s): VITAMINB12, FOLATE, FERRITIN, TIBC, IRON, RETICCTPCT in the last 72 hours. Urinalysis    Component Value Date/Time   COLORURINE yellow 02/26/2009 1157   APPEARANCEUR Clear 02/26/2009 1157   LABSPEC <1.005 02/26/2009 1157   PHURINE 6.5 02/26/2009 1157   HGBUR negative 02/26/2009 1157   BILIRUBINUR negative 02/26/2009 1157   UROBILINOGEN 0.2 02/26/2009 1157   NITRITE negative 02/26/2009 1157   Sepsis Labs Invalid input(s): PROCALCITONIN,  WBC,  LACTICIDVEN Microbiology Recent Results (from the past 240 hour(s))  SARS CORONAVIRUS 2 (TAT 6-24 HRS) Nasopharyngeal Nasopharyngeal Swab     Status: None   Collection Time: 01/30/20  9:38 PM   Specimen: Nasopharyngeal Swab  Result Value Ref Range Status   SARS Coronavirus 2 NEGATIVE NEGATIVE Final    Comment: (NOTE) SARS-CoV-2 target nucleic acids are NOT DETECTED. The SARS-CoV-2 RNA is generally detectable in upper and lower respiratory specimens during the acute phase of infection. Negative results do not preclude SARS-CoV-2 infection, do not rule out co-infections with other pathogens, and should not be used as the sole basis for treatment or other patient management decisions. Negative results must be combined with clinical observations, patient history, and epidemiological information. The expected result is Negative. Fact Sheet for Patients: SugarRoll.be Fact Sheet for Healthcare Providers: https://www.woods-mathews.com/ This test is not yet approved or cleared by the [place] FDA and  has been authorized for detection and/or diagnosis of SARS-CoV-2 by FDA under an Emergency Use Authorization (EUA). This EUA will remain  in effect (meaning this test can be used) for the  duration of the COVID-19 declaration under Section 56 4(b)(1) of the Act, 21 U.S.C. section 360bbb-3(b)(1), unless the authorization is terminated or revoked sooner. Performed at [facility], [place] [address], [place], [place] [address]      Time coordinating discharge: Over 30 minutes  SIGNED:   Wyvonnia Dusky, MD  [facility] 02/01/2020, 1:27 PM Pager   If 7PM-7AM, please contact night-coverage [email or web address]

## 2020-02-01 NOTE — Progress Notes (Signed)
Pt c/o 4/10 chest pain, refused any interventions. No change on tele, VSS. MD made aware and at bedside to assess.

## 2020-02-01 NOTE — Progress Notes (Signed)
Ryan, PA to bedside to assess pt/ chest pain has improved without intervention/ OK to discharge per cardio/ discharge instructions explained to pt and pts wife/ iv and tele removed/ transported off unit via wheelchair.

## 2020-02-02 ENCOUNTER — Telehealth: Payer: Self-pay

## 2020-02-02 NOTE — Telephone Encounter (Signed)
That is fine As long as he has his cardiology follow up--we can hold off on appt here

## 2020-02-02 NOTE — Telephone Encounter (Signed)
Transition Care Management Follow-up Telephone Call  Date of discharge and from where: 02/01/2020, [facility]  How have you been since you were released from the hospital? Patient states that he is feeling better since being discharged form the hospital.   Any questions or concerns? No   Items Reviewed:  Did the pt receive and understand the discharge instructions provided? Yes   Medications obtained and verified? Yes   Any new allergies since your discharge? No   Dietary orders reviewed? Yes  Do you have support at home? Yes   Functional Questionnaire: (I = Independent and D = Dependent) ADLs: I  Bathing/Dressing- I  Meal Prep- I  Eating- I  Maintaining continence- I  Transferring/Ambulation- I  Managing Meds- I  Follow up appointments reviewed:   PCP Hospital f/u appt confirmed? No Patient refused to make appointment at this time. He stated that he had several follow ups scheduled with cardiology and does not want to be exposed to a lot of doctors offices while this pandemic is going on.   [facility] f/u appt confirmed? Yes  Scheduled to see cardiology.  Are transportation arrangements needed? No   If their condition worsens, is the pt aware to call PCP or go to the Emergency Dept.? Yes  Was the patient provided with contact information for the PCP's office or ED? Yes  Was to pt encouraged to call back with questions or concerns? Yes

## 2020-02-04 ENCOUNTER — Other Ambulatory Visit: Payer: Self-pay | Admitting: Surgical

## 2020-02-06 ENCOUNTER — Other Ambulatory Visit: Payer: Self-pay | Admitting: Cardiothoracic Surgery

## 2020-02-06 DIAGNOSIS — Z951 Presence of aortocoronary bypass graft: Secondary | ICD-10-CM

## 2020-02-07 ENCOUNTER — Ambulatory Visit
Admission: RE | Admit: 2020-02-07 | Discharge: 2020-02-07 | Disposition: A | Discharge status: Home / Self Care | Payer: Medicare PPO | Source: Ambulatory Visit | Attending: Cardiothoracic Surgery | Admitting: Cardiothoracic Surgery

## 2020-02-07 ENCOUNTER — Encounter: Payer: Self-pay | Admitting: Cardiothoracic Surgery

## 2020-02-07 ENCOUNTER — Other Ambulatory Visit: Payer: Self-pay | Admitting: *Deleted

## 2020-02-07 ENCOUNTER — Other Ambulatory Visit: Payer: Self-pay

## 2020-02-07 ENCOUNTER — Ambulatory Visit (INDEPENDENT_AMBULATORY_CARE_PROVIDER_SITE_OTHER): Payer: Self-pay | Admitting: Cardiothoracic Surgery

## 2020-02-07 VITALS — BP 136/71 | HR 58 | Temp 98.1°F | Resp 20 | Ht 66.0 in | Wt 127.0 lb

## 2020-02-07 DIAGNOSIS — Z951 Presence of aortocoronary bypass graft: Secondary | ICD-10-CM

## 2020-02-07 NOTE — Progress Notes (Signed)
AMB 

## 2020-02-07 NOTE — Progress Notes (Signed)
PCP is Venia Carbon, MD Referring Provider is Venia Carbon, MD  Chief Complaint  Patient presents with  . Routine Post Op    f/u from surgery with CXR s/p CABG    HPI: 1 month follow-up after emergency CABG x4 after presenting with unstable angina and critical left main stenosis.  Good LV function.  The patient did well after surgery and was discharged home in 5 days.  After being home for several days he developed atrial flutter and was readmitted to [facility] for medical treatment.  He converted with medications and was started on Xarelto.  After that readmission he is done well.  He walks daily without angina.  Surgical incisions well-healed.  No shortness of breath.  No edema.  No bleeding problems from Xarelto.  Past Medical History:  Diagnosis Date  . Arthritis   . Bone spur 2010   right shoulder  . CAD (coronary artery disease)    a. 12/2019 NSTEMI/Cath: LM 99ost/m, LAD 40, LCX nl, OM1 80, RCA 70ost/p, 99p, 30d, EF 45-50%; b. 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  . Hyperlipidemia   . Hypertension   . IBS (irritable bowel syndrome)   . Ischemic cardiomyopathy    a. 12/2019 TEE: EF 45-50%. Nl RV fxn. Mild MR.    Past Surgical History:  Procedure Laterality Date  . BACK SURGERY     Lumbar  . BUNIONECTOMY    . CORONARY ARTERY BYPASS GRAFT N/A 01/08/2020   Procedure: CORONARY ARTERY BYPASS GRAFTING (CABG) x 4  WITH ENDOSCOPIC HARVESTING OF BILATERAL GREATER SAPHENOUS VEINS;  Surgeon: Ivin Poot, MD;  Location: [facility];  Service: Open Heart Surgery;  Laterality: N/A;  . HEMORRHOID SURGERY  2004  . INGUINAL HERNIA REPAIR  02/11  . LEFT HEART CATH AND CORONARY ANGIOGRAPHY N/A 01/08/2020   Procedure: LEFT HEART CATH AND CORONARY ANGIOGRAPHY;  Surgeon: Wellington Hampshire, MD;  Location: [facility];  Service: Cardiovascular;  Laterality: N/A;  . ROTATOR CUFF REPAIR  07/11   left/ bone spur Dr.Blackman  . SHOULDER ARTHROSCOPY WITH  OPEN ROTATOR CUFF REPAIR Right 05/26/2018   Procedure: SHOULDER ARTHROSCOPY WITH OPEN ROTATOR CUFF REPAIR;  Surgeon: Corky Mull, MD;  Location: [facility];  Service: Orthopedics;  Laterality: Right;  debridement, decompression  . SKIN CANCER EXCISION  2003-2005   right shoulder  . TEE WITHOUT CARDIOVERSION N/A 01/08/2020   Procedure: TRANSESOPHAGEAL ECHOCARDIOGRAM (TEE);  Surgeon: Prescott Gum, Collier Salina, MD;  Location: [facility];  Service: Open Heart Surgery;  Laterality: N/A;    Family History  Problem Relation Age of Onset  . Heart attack Father   . Heart disease Brother   . Cancer Neg Hx   . Diabetes Neg Hx     Social History Social History   Tobacco Use  . Smoking status: Never Smoker  . Smokeless tobacco: Never Used  Substance Use Topics  . Alcohol use: Yes    Alcohol/week: 1.0 standard drinks    Types: 1 Glasses of wine per week  . Drug use: No    Current Outpatient Medications  Medication Sig Dispense Refill  . aspirin EC 81 MG EC tablet Take 1 tablet (81 mg total) by mouth daily. 30 tablet 0  . ezetimibe (ZETIA) 10 MG tablet Take 1 tablet (10 mg total) by mouth daily. 90 tablet 3  . ferrous [id number] C-folic acid (TRINSICON / FOLTRIN) capsule Take 1 capsule by mouth 2 (two) times daily after a meal. 60  capsule 2  . hyoscyamine (ANASPAZ) 0.125 MG TBDP disintergrating tablet Place 0.125 mg under the tongue 2 (two) times daily as needed for bladder spasms.    Marland Kitchen lisinopril (ZESTRIL) 2.5 MG tablet TAKE 1 TABLET(2.5 MG) BY MOUTH DAILY 90 tablet 1  . lovastatin (MEVACOR) 20 MG tablet Take 20 mg by mouth daily.     . metoprolol succinate (TOPROL-XL) 50 MG 24 hr tablet Take 1 tablet (50 mg total) by mouth daily. Take with or immediately following a meal. 30 tablet 0  . metoprolol tartrate (LOPRESSOR) 25 MG tablet Take 12.5 mg by mouth 2 (two) times daily as needed ("heart flutter").    . Multiple Vitamin (MULTIVITAMIN WITH MINERALS) TABS tablet Take 1 tablet by mouth daily.    .  Psyllium (METAMUCIL PO) Take 1 Dose by mouth at bedtime. Mix with water    . rivaroxaban (XARELTO) 20 MG TABS tablet Take 1 tablet (20 mg total) by mouth daily. 30 tablet 0   No current facility-administered medications for this visit.    Allergies  Allergen Reactions  . Crestor [Rosuvastatin] Other (See Comments)    Severe myalgias  . Penicillins Rash  . Peanut-Containing Drug Products   . Atorvastatin Other (See Comments)    ache  . Simvastatin Other (See Comments)    aching    Review of Systems  Good appetite and sleeping pattern No headaches or dizziness No difficulty swallowing No change in bowel habits No lower extremity edema Slight  numbness over left part of chest BP 136/71 (BP Location: Right Arm, Patient Position: Sitting, Cuff Size: Normal)   Pulse (!) 58   Temp 98.1 F (36.7 C) (Temporal)   Resp 20   Ht 5\' 6"  (1.676 m)   Wt 127 lb (57.6 kg)   SpO2 99% Comment: RA  BMI 20.50 kg/m  Physical Exam       Exam    General- alert and comfortable    Neck- no JVD, no cervical adenopathy palpable, no carotid bruit   Lungs- clear without rales, wheezes   Cor- regular rate and rhythm, no murmur , gallop   Abdomen- soft, non-tender   Extremities - warm, non-tender, minimal edema   Neuro- oriented, appropriate, no focal weakness   Diagnostic Tests: Chest x-ray shows minimal pleural effusions sternal wires intact  Impression: Doing well after emergency multivessel CABG now maintaining sinus rhythm on beta-blocker only.  Patient is now able to drive and lift up to 10 pounds. Patient would benefit from phase 2 cardiac rehab at [facility] and we will send in a referral. Patient will need to remain on Xarelto for another 1 to 2 months. Plan: We will see patient back in 8 weeks to assess his progress.  He will stay on his current medications.   Len Childs, MD [facility] [phone]

## 2020-02-16 ENCOUNTER — Other Ambulatory Visit
Admission: RE | Admit: 2020-02-16 | Discharge: 2020-02-16 | Disposition: A | Discharge status: Home / Self Care | Payer: Medicare PPO | Attending: Nurse Practitioner | Admitting: Nurse Practitioner

## 2020-02-16 ENCOUNTER — Other Ambulatory Visit: Payer: Self-pay

## 2020-02-16 ENCOUNTER — Telehealth: Payer: Self-pay | Admitting: Nurse Practitioner

## 2020-02-16 DIAGNOSIS — E875 Hyperkalemia: Secondary | ICD-10-CM

## 2020-02-16 DIAGNOSIS — I2510 Atherosclerotic heart disease of native coronary artery without angina pectoris: Secondary | ICD-10-CM

## 2020-02-16 LAB — BASIC METABOLIC PANEL
Anion gap: 8 (ref 5–15)
BUN: 9 mg/dL (ref 8–23)
CO2: 27 mmol/L (ref 22–32)
Calcium: 9.2 mg/dL (ref 8.9–10.3)
Chloride: 103 mmol/L (ref 98–111)
Creatinine, Ser: 0.89 mg/dL (ref 0.61–1.24)
GFR calc Af Amer: >60 mL/min (ref >60)
GFR calc non Af Amer: >60 mL/min (ref >60)
Glucose, Bld: 103 mg/dL — ABNORMAL HIGH (ref 70–99)
Potassium: 5.9 mmol/L — ABNORMAL HIGH (ref 3.5–5.1)
Sodium: 138 mmol/L (ref 135–145)

## 2020-02-16 LAB — HEPATIC FUNCTION PANEL
ALT: 21 U/L (ref 0–44)
AST: 19 U/L (ref 15–41)
Albumin: 4.2 g/dL (ref 3.5–5.0)
Alkaline Phosphatase: 120 U/L (ref 38–126)
Bilirubin, Direct: 0.1 mg/dL (ref 0.0–0.2)
Indirect Bilirubin: 0.8 mg/dL (ref 0.3–0.9)
Total Bilirubin: 0.9 mg/dL (ref 0.3–1.2)
Total Protein: 7.3 g/dL (ref 6.5–8.1)

## 2020-02-16 LAB — LIPID PANEL
Cholesterol: 188 mg/dL (ref 0–200)
HDL: 39 mg/dL — ABNORMAL LOW (ref >40)
LDL Cholesterol: 135 mg/dL — ABNORMAL HIGH (ref 0–99)
Total CHOL/HDL Ratio: 4.8 RATIO
Triglycerides: 68 mg/dL (ref <150)
VLDL: 14 mg/dL (ref 0–40)

## 2020-02-16 MED ORDER — SODIUM POLYSTYRENE SULFONATE 15 GM/60ML PO SUSP: 30.0000 g | Freq: Once | ORAL | 0 refills | Status: AC

## 2020-02-16 NOTE — Telephone Encounter (Signed)
I spoke with the patient and his significant other, [employer]. They are aware of the patient's lab results and Ignacia Bayley, NP's recommendations to: 1) hold lisinopril 2) take kayexalate 30 g x 1 dose today 3) repeat a BMP on Monday @ the [facility]  I have confirmed again with the patient he is not taking any OTC potassium supplements. He does confirm use of Mrs. Dash and eating bananas.  I have asked the her avoid Mrs. Dash and decrease his banana intake.   Ms. Danny Lawless voices understanding of the above recommendations. I have advised her I called [facility] and they do not have Kayexalte in stock.  I have called [facility] and they do have the Kayexalate suspension in stock. Ms. Danny Lawless is aware the RX will sent in there.   I have advised they keep the patient's scheduled appointment with Dr. Garen Lah on 02/22/20.

## 2020-02-16 NOTE — Telephone Encounter (Signed)
Theora Gianotti, NP  02/16/2020 10:32 AM EST    Renal fxn, lft's ok. LDL cholesterol higher than previously noted. We had started zetia, but based on results, it appears he [place] not be taking. Potassium is elevated @ 5.9. He is not on a supplement. Hold lisinopril. Kayexalate 30g x 1 today. Will need [facility]

## 2020-02-19 ENCOUNTER — Other Ambulatory Visit: Payer: Self-pay

## 2020-02-19 ENCOUNTER — Other Ambulatory Visit
Admission: RE | Admit: 2020-02-19 | Discharge: 2020-02-19 | Disposition: A | Discharge status: Home / Self Care | Payer: Medicare PPO | Source: Ambulatory Visit | Attending: Nurse Practitioner | Admitting: Nurse Practitioner

## 2020-02-19 ENCOUNTER — Telehealth: Payer: Self-pay

## 2020-02-19 DIAGNOSIS — E875 Hyperkalemia: Secondary | ICD-10-CM | POA: Insufficient documentation

## 2020-02-19 LAB — BASIC METABOLIC PANEL
Anion gap: 10 (ref 5–15)
BUN: 11 mg/dL (ref 8–23)
CO2: 26 mmol/L (ref 22–32)
Calcium: 8.9 mg/dL (ref 8.9–10.3)
Chloride: 102 mmol/L (ref 98–111)
Creatinine, Ser: 0.88 mg/dL (ref 0.61–1.24)
GFR calc Af Amer: >60 mL/min (ref >60)
GFR calc non Af Amer: >60 mL/min (ref >60)
Glucose, Bld: 102 mg/dL — ABNORMAL HIGH (ref 70–99)
Potassium: 4.5 mmol/L (ref 3.5–5.1)
Sodium: 138 mmol/L (ref 135–145)

## 2020-02-19 NOTE — Telephone Encounter (Signed)
-----   Message from Theora Gianotti, NP sent at 02/19/2020  9:13 AM EST ----- Potassium now normal.  Kidney fxn wnl.  Remain off of lisinopril.  Follow bp at home if able, and keep f/u with Dr. Garen Lah for later this week.

## 2020-02-19 NOTE — Telephone Encounter (Signed)
Spoke to [name], okay per chart. She verbalized understanding of lab results and had no further questions at this time. Will follow up as scheduled. Confirmed upcoming appt.   Advised pt to call for any further questions or concerns.

## 2020-02-21 ENCOUNTER — Ambulatory Visit: Payer: Medicare PPO | Attending: Internal Medicine

## 2020-02-21 DIAGNOSIS — Z23 Encounter for immunization: Secondary | ICD-10-CM | POA: Insufficient documentation

## 2020-02-21 NOTE — Progress Notes (Signed)
   Covid-19 Vaccination Clinic  Name:  [name]    MRN: [id number] DOB: 1941-02-10  02/21/2020  Mr. [name] was observed post Covid-19 immunization for 15 minutes without incident. He was provided with Vaccine Information Sheet and instruction to access the V-Safe system.   Mr. [name] was instructed to call 911 with any severe reactions post vaccine: Marland Kitchen Difficulty breathing  . Swelling of face and throat  . A fast heartbeat  . A bad rash all over body  . Dizziness and weakness   Immunizations Administered    Name Date Dose VIS Date Route   Pfizer COVID-19 Vaccine 02/21/2020  8:55 AM 0.3 mL 12/01/2019 Intramuscular   Manufacturer: [place]   Lot: [id number]   [place]: [id number]

## 2020-02-22 ENCOUNTER — Other Ambulatory Visit: Payer: Self-pay | Admitting: *Deleted

## 2020-02-22 ENCOUNTER — Encounter: Payer: Self-pay | Admitting: Cardiology

## 2020-02-22 ENCOUNTER — Ambulatory Visit (INDEPENDENT_AMBULATORY_CARE_PROVIDER_SITE_OTHER): Payer: Medicare PPO | Admitting: Cardiology

## 2020-02-22 ENCOUNTER — Other Ambulatory Visit: Payer: Self-pay

## 2020-02-22 ENCOUNTER — Ambulatory Visit (INDEPENDENT_AMBULATORY_CARE_PROVIDER_SITE_OTHER): Payer: Medicare PPO

## 2020-02-22 VITALS — BP 140/80 | HR 56 | Ht 66.0 in | Wt 138.0 lb

## 2020-02-22 DIAGNOSIS — I2510 Atherosclerotic heart disease of native coronary artery without angina pectoris: Secondary | ICD-10-CM | POA: Diagnosis not present

## 2020-02-22 DIAGNOSIS — I4892 Unspecified atrial flutter: Secondary | ICD-10-CM

## 2020-02-22 DIAGNOSIS — I5020 Unspecified systolic (congestive) heart failure: Secondary | ICD-10-CM | POA: Diagnosis not present

## 2020-02-22 DIAGNOSIS — E785 Hyperlipidemia, unspecified: Secondary | ICD-10-CM | POA: Diagnosis not present

## 2020-02-22 DIAGNOSIS — I10 Essential (primary) hypertension: Secondary | ICD-10-CM

## 2020-02-22 MED ORDER — METOPROLOL SUCCINATE ER 50 MG PO TB24: 50.0000 mg | ORAL_TABLET | Freq: Every day | ORAL | 3 refills | Status: DC

## 2020-02-22 MED ORDER — LISINOPRIL 2.5 MG PO TABS: ORAL_TABLET | ORAL | 1 refills | Status: DC

## 2020-02-22 NOTE — Progress Notes (Signed)
Cardiology Office Note:    Date:  02/22/2020   ID:  [name], DOB 23-Jun-1941, MRN [id number]  PCP:  Venia Carbon, MD  Cardiologist:  Kate Sable, MD  Electrophysiologist:  None   Referring MD: Venia Carbon, MD   Chief Complaint  Patient presents with  . office visit    Hospital F/U-A Flutter. Patient reports pain above left breast; Meds verbally reviewed with patient.    History of Present Illness:    [name] is a 79 y.o. male with a hx of CAD, status post CABG x4 in January 2021, heart failure reduced ejection fraction, EF 45 to 50%, hypertension, hyperlipidemia a flutter who presents for follow-up.  The patient was originally seen by myself in January 2021 with chest pain/NSTEMI.  Work-up via left heart cath revealed multivessel CAD.  He subsequently underwent CABG x4.  Last admitted a month ago when he developed chest pain and presented to the emergency room.  He was found to be in atrial flutter with RVR.  He was started on Lopressor and Xarelto.  Lopressor was eventually converted to Toprol-XL on discharge.  He has not been able to tolerate Lipitor, simvastatin, or Crestor.  Currently on Zetia and lovastatin.  States doing okay.  Would like to get into the cardiac rehab program.  Also has noticed loss of bruising due to taking Xarelto.  Would like to get off Xarelto.  Past Medical History:  Diagnosis Date  . Arthritis   . Bone spur 2010   right shoulder  . CAD (coronary artery disease)    a. 12/2019 NSTEMI/Cath: LM 99ost/m, LAD 40, LCX nl, OM1 80, RCA 70ost/p, 99p, 30d, EF 45-50%; b. 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  . Hyperlipidemia   . Hypertension   . IBS (irritable bowel syndrome)   . Ischemic cardiomyopathy    a. 12/2019 TEE: EF 45-50%. Nl RV fxn. Mild MR.    Past Surgical History:  Procedure Laterality Date  . BACK SURGERY     Lumbar  . BUNIONECTOMY    . CORONARY ARTERY BYPASS GRAFT N/A 01/08/2020   Procedure: CORONARY  ARTERY BYPASS GRAFTING (CABG) x 4  WITH ENDOSCOPIC HARVESTING OF BILATERAL GREATER SAPHENOUS VEINS;  Surgeon: Ivin Poot, MD;  Location: [facility];  Service: Open Heart Surgery;  Laterality: N/A;  . HEMORRHOID SURGERY  2004  . INGUINAL HERNIA REPAIR  02/11  . LEFT HEART CATH AND CORONARY ANGIOGRAPHY N/A 01/08/2020   Procedure: LEFT HEART CATH AND CORONARY ANGIOGRAPHY;  Surgeon: Wellington Hampshire, MD;  Location: [facility];  Service: Cardiovascular;  Laterality: N/A;  . ROTATOR CUFF REPAIR  07/11   left/ bone spur Dr.Blackman  . SHOULDER ARTHROSCOPY WITH OPEN ROTATOR CUFF REPAIR Right 05/26/2018   Procedure: SHOULDER ARTHROSCOPY WITH OPEN ROTATOR CUFF REPAIR;  Surgeon: Corky Mull, MD;  Location: [facility];  Service: Orthopedics;  Laterality: Right;  debridement, decompression  . SKIN CANCER EXCISION  2003-2005   right shoulder  . TEE WITHOUT CARDIOVERSION N/A 01/08/2020   Procedure: TRANSESOPHAGEAL ECHOCARDIOGRAM (TEE);  Surgeon: Prescott Gum, Collier Salina, MD;  Location: [facility];  Service: Open Heart Surgery;  Laterality: N/A;    Current Medications: Current Meds  Medication Sig  . acetaminophen (TYLENOL) 325 MG tablet Take 325 mg by mouth as needed.  Marland Kitchen aspirin EC 81 MG EC tablet Take 1 tablet (81 mg total) by mouth daily.  Marland Kitchen ezetimibe (ZETIA) 10 MG tablet Take 1 tablet (10 mg total)  by mouth daily.  . ferrous [id number] C-folic acid (TRINSICON / FOLTRIN) capsule Take 1 capsule by mouth 2 (two) times daily after a meal.  . lisinopril (ZESTRIL) 2.5 MG tablet TAKE 1 TABLET(2.5 MG) BY MOUTH DAILY  . lovastatin (MEVACOR) 20 MG tablet Take 20 mg by mouth daily.   . metoprolol succinate (TOPROL-XL) 50 MG 24 hr tablet Take 1 tablet (50 mg total) by mouth daily. Take with or immediately following a meal.  . metoprolol tartrate (LOPRESSOR) 25 MG tablet Take 12.5 mg by mouth 2 (two) times daily as needed ("heart flutter").  . Multiple Vitamin (MULTIVITAMIN WITH MINERALS) TABS tablet Take 1  tablet by mouth daily.  . Psyllium (METAMUCIL PO) Take 1 Dose by mouth at bedtime. Mix with water  . rivaroxaban (XARELTO) 20 MG TABS tablet Take 1 tablet (20 mg total) by mouth daily.  . [DISCONTINUED] lisinopril (ZESTRIL) 2.5 MG tablet TAKE 1 TABLET(2.5 MG) BY MOUTH DAILY     Allergies:   Crestor [rosuvastatin], Penicillins, Peanut-containing drug products, Atorvastatin, and Simvastatin   Social History   Socioeconomic History  . Marital status: Single    Spouse name: Not on file  . Number of children: 2  . Years of education: Not on file  . Highest education level: Not on file  Occupational History  . Occupation: retired Merchandiser, retail: retired  Tobacco Use  . Smoking status: Never Smoker  . Smokeless tobacco: Never Used  Substance and Sexual Activity  . Alcohol use: Not Currently    Alcohol/week: 1.0 standard drinks    Types: 1 Glasses of wine per week    Comment: occassionally  . Drug use: No  . Sexual activity: Not on file  Other Topics Concern  . Not on file  Social History Narrative   2 sons, no contact   Splits his time between [place] and [place]   Has live-in since 2005   Has living will   Requests Bernice--girlfriend or brother Herbie [place], as health care POA   Would accept resuscitation but no prolonged artificial ventilation   Wouldn't want tube feeds if cognitively unaware   Social Determinants of Health   Financial Resource Strain:   . Difficulty of Paying Living Expenses: Not on file  Food Insecurity:   . Worried About Charity fundraiser in the Last Year: Not on file  . Ran Out of Food in the Last Year: Not on file  Transportation Needs:   . Lack of Transportation (Medical): Not on file  . Lack of Transportation (Non-Medical): Not on file  Physical Activity:   . Days of Exercise per Week: Not on file  . Minutes of Exercise per Session: Not on file  Stress:   . Feeling of Stress : Not on file  Social Connections:    . Frequency of Communication with Friends and Family: Not on file  . Frequency of Social Gatherings with Friends and Family: Not on file  . Attends Religious Services: Not on file  . Active Member of Clubs or Organizations: Not on file  . Attends Archivist Meetings: Not on file  . Marital Status: Not on file     Family History: The patient's family history includes Heart attack in his father; Heart disease in his brother. There is no history of Cancer or Diabetes.  ROS:   Please see the history of present illness.     All other systems reviewed and are negative.  EKGs/Labs/Other  Studies Reviewed:    The following studies were reviewed today:   EKG:  EKG is  ordered today.  The ekg ordered today demonstrates sinus bradycardia, heart rate 56  Recent Labs: 01/30/2020: B Natriuretic Peptide 458.0; Magnesium 2.1 01/31/2020: TSH 1.929 02/01/2020: Hemoglobin 10.1; Platelets 322 02/16/2020: ALT 21 02/19/2020: BUN 11; Creatinine, Ser 0.88; Potassium 4.5; Sodium 138  Recent Lipid Panel    Component Value Date/Time   CHOL 188 02/16/2020 0708   TRIG 68 02/16/2020 0708   HDL 39 (L) 02/16/2020 0708   CHOLHDL 4.8 02/16/2020 0708   VLDL 14 02/16/2020 0708   LDLCALC 135 (H) 02/16/2020 0708   LDLDIRECT 188.8 04/05/2012 1254    Physical Exam:    VS:  BP 140/80 (BP Location: Left Arm, Patient Position: Sitting, Cuff Size: Normal)   Pulse (!) 56   Ht 5\' 6"  (1.676 m)   Wt 138 lb (62.6 kg)   SpO2 99%   BMI 22.27 kg/m     Wt Readings from Last 3 Encounters:  02/22/20 138 lb (62.6 kg)  02/07/20 127 lb (57.6 kg)  02/01/20 130 lb 11.2 oz (59.3 kg)     GEN:  Well nourished, well developed in no acute distress HEENT: Normal NECK: No JVD; No carotid bruits LYMPHATICS: No lymphadenopathy CARDIAC: RRR, no murmurs, rubs, gallops RESPIRATORY:  Clear to auscultation without rales, wheezing or rhonchi  ABDOMEN: Soft, non-tender, non-distended MUSCULOSKELETAL:  No edema; No deformity   SKIN: Warm and dry NEUROLOGIC:  Alert and oriented x 3 PSYCHIATRIC:  Normal affect   ASSESSMENT:    1. Coronary artery disease involving native coronary artery of native heart without angina pectoris   2. Hyperlipidemia LDL goal <70   3. HFrEF (heart failure with reduced ejection fraction) ([place])   4. Atrial flutter, unspecified type ([place])   5. Essential hypertension    PLAN:    In order of problems listed above:  1. Patient with history of CAD status post CABG.  Continue aspirin, lovastatin 2. Patient with history of hyperlipidemia, CAD.  Not tolerant to statins.  Continue Zetia, lovastatin.  Plan to repeat lipid panel in about 3 months.  If LDL not at goal on current meds, PCSK9 will be considered. 3. History of mildly reduced ejection fraction with EF 45 to 50%.  Continue Toprol XL 50 daily.  Start lisinopril 2.5 mg daily.  Schedule patient for cardiac rehab. 4. Patient has a history of atrial flutter couple of weeks after CABG.  He endorses easy bruising.  Would like to come off blood thinner.  We will place a cardiac monitor x2 weeks to evaluate for the presence of arrhythmias.  Continue Xarelto for now.  Currently in sinus rhythm. 5. Blood pressure elevated.  Patient states not taking for the past 2 days due to elevated potassium.  Last potassium 3 days ago was within normal limits.  Restart lisinopril 2.5 mg daily.  Follow-up in 3 months.  Total encounter time more than 50 minutes  Greater than 50% was spent in counseling and coordination of care with the patient and wife regarding need for Xarelto, etiologies for arrhythmias, side effects of medications.   Medication Adjustments/Labs and Tests Ordered: Current medicines are reviewed at length with the patient today.  Concerns regarding medicines are outlined above.  Orders Placed This Encounter  Procedures  . Basic metabolic panel  . AMB referral to cardiac rehabilitation  . LONG TERM MONITOR (3-14 DAYS)  . EKG 12-Lead    Meds ordered this encounter  Medications  . lisinopril (ZESTRIL) 2.5 MG tablet    Sig: TAKE 1 TABLET(2.5 MG) BY MOUTH DAILY    Dispense:  90 tablet    Refill:  1    **Patient requests 90 days supply**    Patient Instructions  Medication Instructions:  1- RESTART Lisinopril *If you need a refill on your cardiac medications before your next appointment, please call your pharmacy*   Lab Work: None ordered  If you have labs (blood work) drawn today and your tests are completely normal, you will receive your results only by: Marland Kitchen MyChart Message (if you have MyChart) OR . A paper copy in the mail If you have any lab test that is abnormal or we need to change your treatment, we will call you to review the results.   Testing/Procedures: 1- A zio monitor was placed today. It will remain on for 14 days. You will then return monitor and event diary in provided box. It takes 1-2 weeks for report to be downloaded and returned to [place]. We will call you with the results. If monitor falls of or has orange flashing light, please call Zio for further instructions.      Follow-Up: At [facility], you and your health needs are our priority.  As part of our continuing mission to provide you with exceptional heart care, we have created designated Provider Care Teams.  These Care Teams include your primary Cardiologist (physician) and Advanced Practice Providers (APPs -  Physician Assistants and Nurse Practitioners) who all work together to provide you with the care you need, when you need it.  We recommend signing up for the patient portal called "MyChart".  Sign up information is provided on this After Visit Summary.  MyChart is used to connect with patients for Virtual Visits (Telemedicine).  Patients are able to view lab/test results, encounter notes, upcoming appointments, etc.  Non-urgent messages can be sent to your provider as well.   To learn more about what you can do with MyChart, go to  [email or web address].    Your next appointment:   3 month(s)  The format for your next appointment:   In Person  Provider:    You Longhi see Kate Sable, MD or one of the following Advanced Practice Providers on your designated Care Team:    Murray Hodgkins, NP  Christell Faith, PA-C  Marrianne Mood, PA-C    Other Instructions Ref to Cardiac Rehab    Signed, Kate Sable, MD  02/22/2020 12:11 PM    [place]

## 2020-02-22 NOTE — Telephone Encounter (Signed)
Requested Prescriptions   Signed Prescriptions Disp Refills  . metoprolol succinate (TOPROL-XL) 50 MG 24 hr tablet 30 tablet 3    Sig: Take 1 tablet (50 mg total) by mouth daily. Take with or immediately following a meal.    Authorizing Provider: Kate Sable    Ordering User: Britt Bottom

## 2020-02-22 NOTE — Patient Instructions (Signed)
Medication Instructions:  1- RESTART Lisinopril *If you need a refill on your cardiac medications before your next appointment, please call your pharmacy*   Lab Work: None ordered  If you have labs (blood work) drawn today and your tests are completely normal, you will receive your results only by: Marland Kitchen MyChart Message (if you have MyChart) OR . A paper copy in the mail If you have any lab test that is abnormal or we need to change your treatment, we will call you to review the results.   Testing/Procedures: 1- A zio monitor was placed today. It will remain on for 14 days. You will then return monitor and event diary in provided box. It takes 1-2 weeks for report to be downloaded and returned to [place]. We will call you with the results. If monitor falls of or has orange flashing light, please call Zio for further instructions.      Follow-Up: At [facility], you and your health needs are our priority.  As part of our continuing mission to provide you with exceptional heart care, we have created designated Provider Care Teams.  These Care Teams include your primary Cardiologist (physician) and Advanced Practice Providers (APPs -  Physician Assistants and Nurse Practitioners) who all work together to provide you with the care you need, when you need it.  We recommend signing up for the patient portal called "MyChart".  Sign up information is provided on this After Visit Summary.  MyChart is used to connect with patients for Virtual Visits (Telemedicine).  Patients are able to view lab/test results, encounter notes, upcoming appointments, etc.  Non-urgent messages can be sent to your provider as well.   To learn more about what you can do with MyChart, go to [email or web address].    Your next appointment:   3 month(s)  The format for your next appointment:   In Person  Provider:    You [place] see Kate Sable, MD or one of the following Advanced Practice Providers on your designated  Care Team:    Murray Hodgkins, NP  Christell Faith, PA-C  Marrianne Mood, PA-C    Other Instructions Ref to Cardiac Rehab

## 2020-02-29 ENCOUNTER — Telehealth: Payer: Self-pay

## 2020-02-29 ENCOUNTER — Other Ambulatory Visit: Payer: Self-pay

## 2020-02-29 ENCOUNTER — Other Ambulatory Visit
Admission: RE | Admit: 2020-02-29 | Discharge: 2020-02-29 | Disposition: A | Discharge status: Home / Self Care | Payer: Medicare PPO | Attending: Cardiology | Admitting: Cardiology

## 2020-02-29 DIAGNOSIS — I2510 Atherosclerotic heart disease of native coronary artery without angina pectoris: Secondary | ICD-10-CM

## 2020-02-29 LAB — BASIC METABOLIC PANEL
Anion gap: 7 (ref 5–15)
BUN: 11 mg/dL (ref 8–23)
CO2: 27 mmol/L (ref 22–32)
Calcium: 8.9 mg/dL (ref 8.9–10.3)
Chloride: 101 mmol/L (ref 98–111)
Creatinine, Ser: 0.82 mg/dL (ref 0.61–1.24)
GFR calc Af Amer: >60 mL/min (ref >60)
GFR calc non Af Amer: >60 mL/min (ref >60)
Glucose, Bld: 102 mg/dL — ABNORMAL HIGH (ref 70–99)
Potassium: 4.2 mmol/L (ref 3.5–5.1)
Sodium: 135 mmol/L (ref 135–145)

## 2020-02-29 MED ORDER — RIVAROXABAN 20 MG PO TABS: 20.0000 mg | ORAL_TABLET | Freq: Every day | ORAL | 1 refills | Status: DC

## 2020-02-29 NOTE — Telephone Encounter (Signed)
Refill Request.  

## 2020-02-29 NOTE — Telephone Encounter (Signed)
*  STAT* If patient is at the pharmacy, call can be transferred to refill team.   1. Which medications need to be refilled? (please list name of each medication and dose if known)  Xarelto  2. Which pharmacy/location (including street and city if local pharmacy) is medication to be sent to? [place]  3. Do they need a 30 day or 90 day supply? [place]

## 2020-02-29 NOTE — Addendum Note (Signed)
Addended by: Brynda Peon on: 02/29/2020 03:53 PM   Modules accepted: Orders

## 2020-02-29 NOTE — Telephone Encounter (Signed)
Pt last saw Dr Aaron Edelman Agbor-Etang 02/22/20, last labs 02/29/20 Creat 0.82, age 79, weight 62.6kg, CrCl 65.74, based on CrCl pt is on appropriate dosage of Xarelto 20mg  QD.  Will refill rx.

## 2020-02-29 NOTE — Telephone Encounter (Signed)
Incoming fax from [facility] a refill on Xarelto 20MG 

## 2020-03-01 ENCOUNTER — Telehealth: Payer: Self-pay

## 2020-03-01 NOTE — Telephone Encounter (Signed)
Spoke with patient regarding results.  Patient verbalizes understanding. Advised patient to call back with any issues or concerns.  

## 2020-03-01 NOTE — Telephone Encounter (Signed)
-----   Message from Kate Sable, MD sent at 03/01/2020 12:49 PM EST ----- Creatinine and potassium are within normal limits.

## 2020-03-06 ENCOUNTER — Encounter: Payer: Medicare PPO | Attending: Cardiovascular Disease | Admitting: *Deleted

## 2020-03-06 ENCOUNTER — Encounter: Payer: Self-pay | Admitting: *Deleted

## 2020-03-06 ENCOUNTER — Other Ambulatory Visit: Payer: Self-pay

## 2020-03-06 DIAGNOSIS — Z951 Presence of aortocoronary bypass graft: Secondary | ICD-10-CM | POA: Insufficient documentation

## 2020-03-06 DIAGNOSIS — M1990 Unspecified osteoarthritis, unspecified site: Secondary | ICD-10-CM | POA: Insufficient documentation

## 2020-03-06 DIAGNOSIS — I2510 Atherosclerotic heart disease of native coronary artery without angina pectoris: Secondary | ICD-10-CM | POA: Insufficient documentation

## 2020-03-06 DIAGNOSIS — Z7982 Long term (current) use of aspirin: Secondary | ICD-10-CM | POA: Insufficient documentation

## 2020-03-06 DIAGNOSIS — E785 Hyperlipidemia, unspecified: Secondary | ICD-10-CM | POA: Insufficient documentation

## 2020-03-06 DIAGNOSIS — I214 Non-ST elevation (NSTEMI) myocardial infarction: Secondary | ICD-10-CM

## 2020-03-06 DIAGNOSIS — Z79899 Other long term (current) drug therapy: Secondary | ICD-10-CM | POA: Insufficient documentation

## 2020-03-06 DIAGNOSIS — K589 Irritable bowel syndrome without diarrhea: Secondary | ICD-10-CM | POA: Insufficient documentation

## 2020-03-06 DIAGNOSIS — I10 Essential (primary) hypertension: Secondary | ICD-10-CM | POA: Insufficient documentation

## 2020-03-06 DIAGNOSIS — Z7901 Long term (current) use of anticoagulants: Secondary | ICD-10-CM | POA: Insufficient documentation

## 2020-03-06 NOTE — Progress Notes (Signed)
Completed virtual orientation today.  EP evaluation is scheduled for Thursday  3/18 at 930.  Documentation for diagnosis can be found in [facility] encounter 01/08/20.

## 2020-03-07 ENCOUNTER — Other Ambulatory Visit: Payer: Self-pay

## 2020-03-07 ENCOUNTER — Encounter: Payer: Medicare PPO | Admitting: *Deleted

## 2020-03-07 VITALS — Ht 66.5 in | Wt 135.2 lb

## 2020-03-07 DIAGNOSIS — Z79899 Other long term (current) drug therapy: Secondary | ICD-10-CM | POA: Diagnosis not present

## 2020-03-07 DIAGNOSIS — Z951 Presence of aortocoronary bypass graft: Secondary | ICD-10-CM

## 2020-03-07 DIAGNOSIS — Z7901 Long term (current) use of anticoagulants: Secondary | ICD-10-CM | POA: Diagnosis not present

## 2020-03-07 DIAGNOSIS — I214 Non-ST elevation (NSTEMI) myocardial infarction: Secondary | ICD-10-CM

## 2020-03-07 DIAGNOSIS — I10 Essential (primary) hypertension: Secondary | ICD-10-CM | POA: Diagnosis not present

## 2020-03-07 DIAGNOSIS — M1990 Unspecified osteoarthritis, unspecified site: Secondary | ICD-10-CM | POA: Diagnosis not present

## 2020-03-07 DIAGNOSIS — Z7982 Long term (current) use of aspirin: Secondary | ICD-10-CM | POA: Diagnosis not present

## 2020-03-07 DIAGNOSIS — E785 Hyperlipidemia, unspecified: Secondary | ICD-10-CM | POA: Diagnosis not present

## 2020-03-07 DIAGNOSIS — I2510 Atherosclerotic heart disease of native coronary artery without angina pectoris: Secondary | ICD-10-CM | POA: Diagnosis not present

## 2020-03-07 DIAGNOSIS — K589 Irritable bowel syndrome without diarrhea: Secondary | ICD-10-CM | POA: Diagnosis not present

## 2020-03-07 NOTE — Patient Instructions (Signed)
Patient Instructions  Patient Details  Name: [name] MRN: [id number] Date of Birth: 02/10/1941 Referring Provider:  Wellington Hampshire, MD  Below are your personal goals for exercise, nutrition, and risk factors. Our goal is to help you stay on track towards obtaining and maintaining these goals. We will be discussing your progress on these goals with you throughout the program.  Initial Exercise Prescription: Initial Exercise Prescription - 03/07/20 1000      Date of Initial Exercise RX and Referring Provider   Date  03/07/20    Referring Provider  Kathlyn Sacramento MD      Treadmill   MPH  2.8    Grade  0    Minutes  15    METs  3.1      Recumbant Bike   Level  2    RPM  50    Watts  22    Minutes  15    METs  3      NuStep   Level  2    SPM  80    Minutes  15    METs  3      REL-XR   Level  2    Speed  50    Minutes  15    METs  3      Prescription Details   Frequency (times per week)  2    Duration  Progress to 30 minutes of continuous aerobic without signs/symptoms of physical distress      Intensity   THRR 40-80% of Max Heartrate  89-124    Ratings of Perceived Exertion  11-13    Perceived Dyspnea  0-4      Progression   Progression  Continue to progress workloads to maintain intensity without signs/symptoms of physical distress.      Resistance Training   Training Prescription  Yes    Weight  3 lb    Reps  10-15       Exercise Goals: Frequency: Be able to perform aerobic exercise two to three times per week in program working toward 2-5 days per week of home exercise.  Intensity: Work with a perceived exertion of 11 (fairly light) - 15 (hard) while following your exercise prescription.  We will make changes to your prescription with you as you progress through the program.   Duration: Be able to do 30 to 45 minutes of continuous aerobic exercise in addition to a 5 minute warm-up and a 5 minute cool-down routine.   Nutrition Goals: Your  personal nutrition goals will be established when you do your nutrition analysis with the dietician.  The following are general nutrition guidelines to follow: Cholesterol < 200mg /day Sodium < 1500mg /day Fiber: Men over 50 yrs - 30 grams per day  Personal Goals: Personal Goals and Risk Factors at Admission - 03/07/20 1226      Core Components/Risk Factors/Patient Goals on Admission    Weight Management  Yes;Weight Maintenance    Intervention  Weight Management: Develop a combined nutrition and exercise program designed to reach desired caloric intake, while maintaining appropriate intake of nutrient and fiber, sodium and fats, and appropriate energy expenditure required for the weight goal.;Weight Management: Provide education and appropriate resources to help participant work on and attain dietary goals.    Admit Weight  135 lb 3.2 oz (61.3 kg)    Goal Weight: Short Term  135 lb (61.2 kg)    Goal Weight: Long Term  135 lb (61.2 kg)  Expected Outcomes  Short Term: Continue to assess and modify interventions until short term weight is achieved;Long Term: Adherence to nutrition and physical activity/exercise program aimed toward attainment of established weight goal;Weight Maintenance: Understanding of the daily nutrition guidelines, which includes 25-35% calories from fat, 7% or less cal from saturated fats, less than 200mg  cholesterol, less than 1.5gm of sodium, & 5 or more servings of fruits and vegetables daily    Hypertension  Yes    Intervention  Provide education on lifestyle modifcations including regular physical activity/exercise, weight management, moderate sodium restriction and increased consumption of fresh fruit, vegetables, and low fat dairy, alcohol moderation, and smoking cessation.;Monitor prescription use compliance.    Expected Outcomes  Short Term: Continued assessment and intervention until BP is < 140/47mm HG in hypertensive participants. < 130/73mm HG in hypertensive  participants with diabetes, heart failure or chronic kidney disease.;Long Term: Maintenance of blood pressure at goal levels.    Lipids  Yes    Intervention  Provide education and support for participant on nutrition & aerobic/resistive exercise along with prescribed medications to achieve LDL 70mg , HDL >40mg .    Expected Outcomes  Short Term: Participant states understanding of desired cholesterol values and is compliant with medications prescribed. Participant is following exercise prescription and nutrition guidelines.;Long Term: Cholesterol controlled with medications as prescribed, with individualized exercise RX and with personalized nutrition plan. Value goals: LDL < 70mg , HDL > 40 mg.       Tobacco Use Initial Evaluation: Social History   Tobacco Use  Smoking Status Never Smoker  Smokeless Tobacco Never Used    Exercise Goals and Review: Exercise Goals    Row Name 03/07/20 1057             Exercise Goals   Increase Physical Activity  Yes       Intervention  Provide advice, education, support and counseling about physical activity/exercise needs.;Develop an individualized exercise prescription for aerobic and resistive training based on initial evaluation findings, risk stratification, comorbidities and participant's personal goals.       Expected Outcomes  Long Term: Add in home exercise to make exercise part of routine and to increase amount of physical activity.;Short Term: Attend rehab on a regular basis to increase amount of physical activity.;Long Term: Exercising regularly at least 3-5 days a week.       Increase Strength and Stamina  Yes       Intervention  Provide advice, education, support and counseling about physical activity/exercise needs.;Develop an individualized exercise prescription for aerobic and resistive training based on initial evaluation findings, risk stratification, comorbidities and participant's personal goals.       Expected Outcomes  Short Term:  Increase workloads from initial exercise prescription for resistance, speed, and METs.;Short Term: Perform resistance training exercises routinely during rehab and add in resistance training at home;Long Term: Improve cardiorespiratory fitness, muscular endurance and strength as measured by increased METs and functional capacity (6MWT)       Able to understand and use rate of perceived exertion (RPE) scale  Yes       Intervention  Provide education and explanation on how to use RPE scale       Expected Outcomes  Long Term:  Able to use RPE to guide intensity level when exercising independently;Short Term: Able to use RPE daily in rehab to express subjective intensity level       Able to understand and use Dyspnea scale  Yes       Intervention  Provide  education and explanation on how to use Dyspnea scale       Expected Outcomes  Short Term: Able to use Dyspnea scale daily in rehab to express subjective sense of shortness of breath during exertion;Long Term: Able to use Dyspnea scale to guide intensity level when exercising independently       Knowledge and understanding of Target Heart Rate Range (THRR)  Yes       Intervention  Provide education and explanation of THRR including how the numbers were predicted and where they are located for reference       Expected Outcomes  Short Term: Able to state/look up THRR;Short Term: Able to use daily as guideline for intensity in rehab;Long Term: Able to use THRR to govern intensity when exercising independently       Able to check pulse independently  Yes       Intervention  Provide education and demonstration on how to check pulse in carotid and radial arteries.;Review the importance of being able to check your own pulse for safety during independent exercise       Expected Outcomes  Short Term: Able to explain why pulse checking is important during independent exercise;Long Term: Able to check pulse independently and accurately       Understanding of Exercise  Prescription  Yes       Intervention  Provide education, explanation, and written materials on patient's individual exercise prescription       Expected Outcomes  Short Term: Able to explain program exercise prescription;Long Term: Able to explain home exercise prescription to exercise independently          Copy of goals given to participant.

## 2020-03-07 NOTE — Progress Notes (Signed)
Cardiac Individual Treatment Plan  Patient Details  Name: [name] MRN: [id number] Date of Birth: January 31, 1941 Referring Provider:     Cardiac Rehab from 03/07/2020 in [facility] Cardiac and Pulmonary Rehab  Referring Provider  Kathlyn Sacramento MD      Initial Encounter Date:    Cardiac Rehab from 03/07/2020 in [facility] Cardiac and Pulmonary Rehab  Date  03/07/20      Visit Diagnosis: NSTEMI (non-ST elevated myocardial infarction) ([place])  S/P CABG x 4  Patient's Home Medications on Admission:  Current Outpatient Medications:  .  acetaminophen (TYLENOL) 325 MG tablet, Take 325 mg by mouth as needed., Disp: , Rfl:  .  aspirin EC 81 MG tablet, Take 81 mg by mouth daily., Disp: , Rfl:  .  ezetimibe (ZETIA) 10 MG tablet, Take 1 tablet (10 mg total) by mouth daily., Disp: 90 tablet, Rfl: 3 .  hyoscyamine (ANASPAZ) 0.125 MG TBDP disintergrating tablet, Place 0.125 mg under the tongue 2 (two) times daily as needed for bladder spasms., Disp: , Rfl:  .  lisinopril (ZESTRIL) 2.5 MG tablet, TAKE 1 TABLET(2.5 MG) BY MOUTH DAILY, Disp: 90 tablet, Rfl: 1 .  lovastatin (MEVACOR) 20 MG tablet, Take 20 mg by mouth daily. , Disp: , Rfl:  .  metoprolol succinate (TOPROL-XL) 50 MG 24 hr tablet, Take 1 tablet (50 mg total) by mouth daily. Take with or immediately following a meal., Disp: 30 tablet, Rfl: 3 .  metoprolol tartrate (LOPRESSOR) 25 MG tablet, Take 12.5 mg by mouth 2 (two) times daily as needed ("heart flutter")., Disp: , Rfl:  .  Multiple Vitamin (MULTIVITAMIN WITH MINERALS) TABS tablet, Take 1 tablet by mouth daily., Disp:  , Rfl:  .  rivaroxaban (XARELTO) 20 MG TABS tablet, Take 1 tablet (20 mg total) by mouth daily., Disp: 90 tablet, Rfl: 1 .  ferrous [id number] C-folic acid (TRINSICON / FOLTRIN) capsule, Take 1 capsule by mouth 2 (two) times daily after a meal. (Patient not taking: Reported on 03/07/2020), Disp: 60 capsule, Rfl: 2 .  Psyllium (METAMUCIL PO), Take 1 Dose by mouth at bedtime.  Mix with water, Disp: , Rfl:   Past Medical History: Past Medical History:  Diagnosis Date  . Arthritis   . Bone spur 2010   right shoulder  . CAD (coronary artery disease)    a. 12/2019 NSTEMI/Cath: LM 99ost/m, LAD 40, LCX nl, OM1 80, RCA 70ost/p, 99p, 30d, EF 45-50%; b. 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  . Hyperlipidemia   . Hypertension   . IBS (irritable bowel syndrome)   . Ischemic cardiomyopathy    a. 12/2019 TEE: EF 45-50%. Nl RV fxn. Mild MR.    Tobacco Use: Social History   Tobacco Use  Smoking Status Never Smoker  Smokeless Tobacco Never Used    Labs: Recent Review Flowsheet Data    Labs for ITP Cardiac and Pulmonary Rehab Latest Ref Rng & Units 01/09/2020 01/09/2020 01/10/2020 01/11/2020 02/16/2020   Cholestrol 0 - 200 mg/dL - - - - 188   LDLCALC 0 - 99 mg/dL - - - - 135(H)   LDLDIRECT mg/dL - - - - -   HDL >40 mg/dL - - - - 39(L)   Trlycerides <150 mg/dL - - - - 68   Hemoglobin A1c 4.8 - 5.6 % - - - - -   PHART 7.350 - 7.450 7.455(H) - - - -   PCO2ART 32.0 - 48.0 mmHg 31.3(L) - - - -   HCO3 20.0 - 28.0  mmol/L 21.8 - - - -   TCO2 22 - 32 mmol/L 23 - - - -   ACIDBASEDEF 0.0 - 2.0 mmol/L 1.0 - - - -   O2SAT % 96.0 54.2 61.2 63.5 -       Exercise Target Goals: Exercise Program Goal: Individual exercise prescription set using results from initial 6 min walk test and THRR while considering  patient's activity barriers and safety.   Exercise Prescription Goal: Initial exercise prescription builds to 30-45 minutes a day of aerobic activity, 2-3 days per week.  Home exercise guidelines will be given to patient during program as part of exercise prescription that the participant will acknowledge.  Activity Barriers & Risk Stratification: Activity Barriers & Cardiac Risk Stratification - 03/07/20 1033      Activity Barriers & Cardiac Risk Stratification   Activity Barriers  Arthritis;Deconditioning;Muscular Weakness;Joint Problems;Incisional Pain     Cardiac Risk Stratification  High       6 Minute Walk: 6 Minute Walk    Row Name 03/07/20 1032         6 Minute Walk   Phase  Initial     Distance  1530 feet     Walk Time  6 minutes     # of Rest Breaks  0     MPH  2.9     METS  3.13     RPE  9     VO2 Peak  10.96     Symptoms  No     Resting HR  54 bpm     Resting BP  124/72     Resting Oxygen Saturation   97 %     Exercise Oxygen Saturation  during 6 min walk  100 %     Max Ex. HR  90 bpm     Max Ex. BP  136/74     2 Minute Post BP  112/56        Oxygen Initial Assessment:   Oxygen Re-Evaluation:   Oxygen Discharge (Final Oxygen Re-Evaluation):   Initial Exercise Prescription: Initial Exercise Prescription - 03/07/20 1000      Date of Initial Exercise RX and Referring Provider   Date  03/07/20    Referring Provider  Kathlyn Sacramento MD      Treadmill   MPH  2.8    Grade  0    Minutes  15    METs  3.1      Recumbant Bike   Level  2    RPM  50    Watts  22    Minutes  15    METs  3      NuStep   Level  2    SPM  80    Minutes  15    METs  3      REL-XR   Level  2    Speed  50    Minutes  15    METs  3      Prescription Details   Frequency (times per week)  2    Duration  Progress to 30 minutes of continuous aerobic without signs/symptoms of physical distress      Intensity   THRR 40-80% of Max Heartrate  89-124    Ratings of Perceived Exertion  11-13    Perceived Dyspnea  0-4      Progression   Progression  Continue to progress workloads to maintain intensity without signs/symptoms of physical distress.  Resistance Training   Training Prescription  Yes    Weight  3 lb    Reps  10-15       Perform Capillary Blood Glucose checks as needed.  Exercise Prescription Changes: Exercise Prescription Changes    Row Name 03/07/20 1000             Response to Exercise   Blood Pressure (Admit)  124/72       Blood Pressure (Exercise)  136/74       Blood Pressure (Exit)   112/56       Heart Rate (Admit)  54 bpm       Heart Rate (Exercise)  90 bpm       Heart Rate (Exit)  54 bpm       Oxygen Saturation (Admit)  97 %       Oxygen Saturation (Exercise)  100 %       Rating of Perceived Exertion (Exercise)  9       Symptoms  none       Comments  walk test results          Exercise Comments:   Exercise Goals and Review: Exercise Goals    Row Name 03/07/20 1057             Exercise Goals   Increase Physical Activity  Yes       Intervention  Provide advice, education, support and counseling about physical activity/exercise needs.;Develop an individualized exercise prescription for aerobic and resistive training based on initial evaluation findings, risk stratification, comorbidities and participant's personal goals.       Expected Outcomes  Long Term: Add in home exercise to make exercise part of routine and to increase amount of physical activity.;Short Term: Attend rehab on a regular basis to increase amount of physical activity.;Long Term: Exercising regularly at least 3-5 days a week.       Increase Strength and Stamina  Yes       Intervention  Provide advice, education, support and counseling about physical activity/exercise needs.;Develop an individualized exercise prescription for aerobic and resistive training based on initial evaluation findings, risk stratification, comorbidities and participant's personal goals.       Expected Outcomes  Short Term: Increase workloads from initial exercise prescription for resistance, speed, and METs.;Short Term: Perform resistance training exercises routinely during rehab and add in resistance training at home;Long Term: Improve cardiorespiratory fitness, muscular endurance and strength as measured by increased METs and functional capacity (6MWT)       Able to understand and use rate of perceived exertion (RPE) scale  Yes       Intervention  Provide education and explanation on how to use RPE scale       Expected  Outcomes  Long Term:  Able to use RPE to guide intensity level when exercising independently;Short Term: Able to use RPE daily in rehab to express subjective intensity level       Able to understand and use Dyspnea scale  Yes       Intervention  Provide education and explanation on how to use Dyspnea scale       Expected Outcomes  Short Term: Able to use Dyspnea scale daily in rehab to express subjective sense of shortness of breath during exertion;Long Term: Able to use Dyspnea scale to guide intensity level when exercising independently       Knowledge and understanding of Target Heart Rate Range (THRR)  Yes       Intervention  Provide education and explanation of THRR including how the numbers were predicted and where they are located for reference       Expected Outcomes  Short Term: Able to state/look up THRR;Short Term: Able to use daily as guideline for intensity in rehab;Long Term: Able to use THRR to govern intensity when exercising independently       Able to check pulse independently  Yes       Intervention  Provide education and demonstration on how to check pulse in carotid and radial arteries.;Review the importance of being able to check your own pulse for safety during independent exercise       Expected Outcomes  Short Term: Able to explain why pulse checking is important during independent exercise;Long Term: Able to check pulse independently and accurately       Understanding of Exercise Prescription  Yes       Intervention  Provide education, explanation, and written materials on patient's individual exercise prescription       Expected Outcomes  Short Term: Able to explain program exercise prescription;Long Term: Able to explain home exercise prescription to exercise independently          Exercise Goals Re-Evaluation :   Discharge Exercise Prescription (Final Exercise Prescription Changes): Exercise Prescription Changes - 03/07/20 1000      Response to Exercise   Blood  Pressure (Admit)  124/72    Blood Pressure (Exercise)  136/74    Blood Pressure (Exit)  112/56    Heart Rate (Admit)  54 bpm    Heart Rate (Exercise)  90 bpm    Heart Rate (Exit)  54 bpm    Oxygen Saturation (Admit)  97 %    Oxygen Saturation (Exercise)  100 %    Rating of Perceived Exertion (Exercise)  9    Symptoms  none    Comments  walk test results       Nutrition:  Target Goals: Understanding of nutrition guidelines, daily intake of sodium <1559m, cholesterol <2039m calories 30% from fat and 7% or less from saturated fats, daily to have 5 or more servings of fruits and vegetables.  Biometrics: Pre Biometrics - 03/07/20 1058      Pre Biometrics   Height  5' 6.5" (1.689 m)    Weight  135 lb 3.2 oz (61.3 kg)    BMI (Calculated)  21.5    Single Leg Stand  30 seconds        Nutrition Therapy Plan and Nutrition Goals:   Nutrition Assessments: Nutrition Assessments - 03/07/20 1225      MEDFICTS Scores   Pre Score  36       Nutrition Goals Re-Evaluation:   Nutrition Goals Discharge (Final Nutrition Goals Re-Evaluation):   Psychosocial: Target Goals: Acknowledge presence or absence of significant depression and/or stress, maximize coping skills, provide positive support system. Participant is able to verbalize types and ability to use techniques and skills needed for reducing stress and depression.   Initial Review & Psychosocial Screening: Initial Psych Review & Screening - 03/06/20 1108      Initial Review   Current issues with  Current Stress Concerns    Source of Stress Concerns  Financial;Family;Chronic Illness;Unable to participate in former interests or hobbies;Unable to perform yard/household activities    Comments  Taxes are always stressful, not currently able to get out to garden,  family strain for finances as mother is [id number] running out of mo[employer]il  Good Support System?  Yes   wife, brother and sister nearby as well      Barriers   Psychosocial barriers to participate in program  The patient should benefit from training in stress management and relaxation.;Psychosocial barriers identified (see note)      Screening Interventions   Interventions  Encouraged to exercise;To provide support and resources with identified psychosocial needs;Provide feedback about the scores to participant    Expected Outcomes  Short Term goal: Utilizing psychosocial counselor, staff and physician to assist with identification of specific Stressors or current issues interfering with healing process. Setting desired goal for each stressor or current issue identified.;Long Term Goal: Stressors or current issues are controlled or eliminated.;Short Term goal: Identification and review with participant of any Quality of Life or Depression concerns found by scoring the questionnaire.;Long Term goal: The participant improves quality of Life and PHQ9 Scores as seen by post scores and/or verbalization of changes       Quality of Life Scores:  Quality of Life - 03/07/20 1224      Quality of Life   Select  Quality of Life      Quality of Life Scores   Health/Function Pre  29.57 %    Socioeconomic Pre  30 %    Psych/Spiritual Pre  30 %    Family Pre  30 %    GLOBAL Pre  29.81 %      Scores of 19 and below usually indicate a poorer quality of life in these areas.  A difference of  2-3 points is a clinically meaningful difference.  A difference of 2-3 points in the total score of the Quality of Life Index has been associated with significant improvement in overall quality of life, self-image, physical symptoms, and general health in studies assessing change in quality of life.  PHQ-9: Recent Review Flowsheet Data    Depression screen [facility] 2/9 03/07/2020 06/02/2017 05/25/2016 05/21/2015 05/21/2015   Decreased Interest 0 0 0 0 0   Down, Depressed, Hopeless 0 0 0 0 0   PHQ - 2 Score 0 0 0 0 0   Altered sleeping 0 - - - -   Tired, decreased energy 0  - - - -   Change in appetite 0 - - - -   Feeling bad or failure about yourself  0 - - - -   Trouble concentrating 0 - - - -   Moving slowly or fidgety/restless 0 - - - -   Suicidal thoughts 0 - - - -   PHQ-9 Score 0 - - - -   Difficult doing work/chores Not difficult at all - - - -     Interpretation of Total Score  Total Score Depression Severity:  1-4 = Minimal depression, 5-9 = Mild depression, 10-14 = Moderate depression, 15-19 = Moderately severe depression, 20-27 = Severe depression   Psychosocial Evaluation and Intervention: Psychosocial Evaluation - 03/06/20 1114      Psychosocial Evaluation & Interventions   Interventions  Stress management education;Encouraged to exercise with the program and follow exercise prescription    Comments  [name] is coming into cardiac rehab after a heart attack and by-pass sugery.  He is doing well with his recovery, but eager to get back to his garden.  He feels like he has lost his strength and muscle tone and eager to regain them back to be able to get out into the garden.  His wife and family are big supporters of him.  However, they do have one major family and financial strain.  His mother is 77 in poor health and has dementia. She has four aides that help watch her around the clock but she is running out of money from insurance to cover her care and the family is trying to figure out what to do.  He also has $35 copay for rehab and are a little worried about that as well.  He is going to aim for two days a week to try to help keep cost lower for him.  He lost his glasses in transfer from [facility] to [facility] for surgery and is scheduled for cataract surgery soon so just using old glasses currently.    Expected Outcomes  Short: Attend rehab to rebuild strength  Long: Continue to recover to be able to garden again.    Continue Psychosocial Services   Follow up required by staff       Psychosocial Re-Evaluation:   Psychosocial Discharge (Final  Psychosocial Re-Evaluation):   Vocational Rehabilitation: Provide vocational rehab assistance to qualifying candidates.   Vocational Rehab Evaluation & Intervention:   Education: Education Goals: Education classes will be provided on a variety of topics geared toward better understanding of heart health and risk factor modification. Participant will state understanding/return demonstration of topics presented as noted by education test scores.  Learning Barriers/Preferences: Learning Barriers/Preferences - 03/06/20 1106      Learning Barriers/Preferences   Learning Barriers  Sight;Hearing   HOH, lost glasses with surgery cataract surgery in [place]   Learning Preferences  None       Education Topics:  AED/CPR: - Group verbal and written instruction with the use of models to demonstrate the basic use of the AED with the basic ABC's of resuscitation.   General Nutrition Guidelines/Fats and Fiber: -Group instruction provided by verbal, written material, models and posters to present the general guidelines for heart healthy nutrition. Gives an explanation and review of dietary fats and fiber.   Controlling Sodium/Reading Food Labels: -Group verbal and written material supporting the discussion of sodium use in heart healthy nutrition. Review and explanation with models, verbal and written materials for utilization of the food label.   Exercise Physiology & General Exercise Guidelines: - Group verbal and written instruction with models to review the exercise physiology of the cardiovascular system and associated critical values. Provides general exercise guidelines with specific guidelines to those with heart or lung disease.    Aerobic Exercise & Resistance Training: - Gives group verbal and written instruction on the various components of exercise. Focuses on aerobic and resistive training programs and the benefits of this training and how to safely progress through these  programs..   Flexibility, Balance, Mind/Body Relaxation: Provides group verbal/written instruction on the benefits of flexibility and balance training, including mind/body exercise modes such as yoga, pilates and tai chi.  Demonstration and skill practice provided.   Stress and Anxiety: - Provides group verbal and written instruction about the health risks of elevated stress and causes of high stress.  Discuss the correlation between heart/lung disease and anxiety and treatment options. Review healthy ways to manage with stress and anxiety.   Depression: - Provides group verbal and written instruction on the correlation between heart/lung disease and depressed mood, treatment options, and the stigmas associated with seeking treatment.   Anatomy & Physiology of the Heart: - Group verbal and written instruction and models provide basic cardiac anatomy and physiology, with the coronary electrical and arterial systems. Review of Valvular  disease and Heart Failure   Cardiac Procedures: - Group verbal and written instruction to review commonly prescribed medications for heart disease. Reviews the medication, class of the drug, and side effects. Includes the steps to properly store meds and maintain the prescription regimen. (beta blockers and nitrates)   Cardiac Medications I: - Group verbal and written instruction to review commonly prescribed medications for heart disease. Reviews the medication, class of the drug, and side effects. Includes the steps to properly store meds and maintain the prescription regimen.   Cardiac Medications II: -Group verbal and written instruction to review commonly prescribed medications for heart disease. Reviews the medication, class of the drug, and side effects. (all other drug classes)    Go Sex-Intimacy & Heart Disease, Get SMART - Goal Setting: - Group verbal and written instruction through game format to discuss heart disease and the return to sexual  intimacy. Provides group verbal and written material to discuss and apply goal setting through the application of the S.M.A.R.T. Method.   Other Matters of the Heart: - Provides group verbal, written materials and models to describe Stable Angina and Peripheral Artery. Includes description of the disease process and treatment options available to the cardiac patient.   Exercise & Equipment Safety: - Individual verbal instruction and demonstration of equipment use and safety with use of the equipment.   Cardiac Rehab from 03/07/2020 in [facility] Cardiac and Pulmonary Rehab  Date  03/07/20  Educator  [facility]  Instruction Review Code  1- Verbalizes Understanding      Infection Prevention: - Provides verbal and written material to individual with discussion of infection control including proper hand washing and proper equipment cleaning during exercise session.   Cardiac Rehab from 03/07/2020 in [facility] Cardiac and Pulmonary Rehab  Date  03/07/20  Educator  [facility]  Instruction Review Code  1- Verbalizes Understanding      Falls Prevention: - Provides verbal and written material to individual with discussion of falls prevention and safety.   Cardiac Rehab from 03/07/2020 in [facility] Cardiac and Pulmonary Rehab  Date  03/07/20  Educator  [facility]  Instruction Review Code  1- Verbalizes Understanding      Diabetes: - Individual verbal and written instruction to review signs/symptoms of diabetes, desired ranges of glucose level fasting, after meals and with exercise. Acknowledge that pre and post exercise glucose checks will be done for 3 sessions at entry of program.   Know Your Numbers and Risk Factors: -Group verbal and written instruction about important numbers in your health.  Discussion of what are risk factors and how they play a role in the disease process.  Review of Cholesterol, Blood Pressure, Diabetes, and BMI and the role they play in your overall health.   Sleep Hygiene: -Provides group verbal  and written instruction about how sleep can affect your health.  Define sleep hygiene, discuss sleep cycles and impact of sleep habits. Review good sleep hygiene tips.    Other: -Provides group and verbal instruction on various topics (see comments)   Knowledge Questionnaire Score: Knowledge Questionnaire Score - 03/07/20 1225      Knowledge Questionnaire Score   Pre Score  22/26 Education Focus: pulse, MI, PAD, exercise       Core Components/Risk Factors/Patient Goals at Admission: Personal Goals and Risk Factors at Admission - 03/07/20 1226      Core Components/Risk Factors/Patient Goals on Admission    Weight Management  Yes;Weight Maintenance    Intervention  Weight Management: Develop a combined nutrition  and exercise program designed to reach desired caloric intake, while maintaining appropriate intake of nutrient and fiber, sodium and fats, and appropriate energy expenditure required for the weight goal.;Weight Management: Provide education and appropriate resources to help participant work on and attain dietary goals.    Admit Weight  135 lb 3.2 oz (61.3 kg)    Goal Weight: Short Term  135 lb (61.2 kg)    Goal Weight: Long Term  135 lb (61.2 kg)    Expected Outcomes  Short Term: Continue to assess and modify interventions until short term weight is achieved;Long Term: Adherence to nutrition and physical activity/exercise program aimed toward attainment of established weight goal;Weight Maintenance: Understanding of the daily nutrition guidelines, which includes 25-35% calories from fat, 7% or less cal from saturated fats, less than 241m cholesterol, less than 1.5gm of sodium, & 5 or more servings of fruits and vegetables daily    Hypertension  Yes    Intervention  Provide education on lifestyle modifcations including regular physical activity/exercise, weight management, moderate sodium restriction and increased consumption of fresh fruit, vegetables, and low fat dairy, alcohol  moderation, and smoking cessation.;Monitor prescription use compliance.    Expected Outcomes  Short Term: Continued assessment and intervention until BP is < 140/914mHG in hypertensive participants. < 130/8054mG in hypertensive participants with diabetes, heart failure or chronic kidney disease.;Long Term: Maintenance of blood pressure at goal levels.    Lipids  Yes    Intervention  Provide education and support for participant on nutrition & aerobic/resistive exercise along with prescribed medications to achieve LDL <32m65mDL >40mg68m Expected Outcomes  Short Term: Participant states understanding of desired cholesterol values and is compliant with medications prescribed. Participant is following exercise prescription and nutrition guidelines.;Long Term: Cholesterol controlled with medications as prescribed, with individualized exercise RX and with personalized nutrition plan. Value goals: LDL < 32mg,63m > 40 mg.       Core Components/Risk Factors/Patient Goals Review:    Core Components/Risk Factors/Patient Goals at Discharge (Final Review):    ITP Comments: ITP Comments    Row Name 03/06/20 1123 03/07/20 1032         ITP Comments  Completed virtual orientation today.  EP evaluation is scheduled for Thursday 3/18 at 930.  [facility].  Completed 6MWT and gym orientation.  Initial ITP created and sent for review to Dr. Mark MEmily Filbertcal Director.         Comments: Initial ITP

## 2020-03-12 ENCOUNTER — Other Ambulatory Visit: Payer: Self-pay

## 2020-03-12 DIAGNOSIS — I214 Non-ST elevation (NSTEMI) myocardial infarction: Secondary | ICD-10-CM | POA: Diagnosis not present

## 2020-03-12 DIAGNOSIS — Z951 Presence of aortocoronary bypass graft: Secondary | ICD-10-CM

## 2020-03-12 NOTE — Progress Notes (Signed)
Daily Session Note  Patient Details  Name: [name] MRN: [id number] Date of Birth: 10/07/1941 Referring Provider:     Cardiac Rehab from 03/07/2020 in [facility]  Referring Provider  Kathlyn Sacramento MD      Encounter Date: 03/12/2020  Check In: Session Check In - 03/12/20 0753      Check-In   Supervising physician immediately available to respond to emergencies  See telemetry face sheet for immediately available ER MD    Location  [facility]    Staff Present  Renita Papa, RN Vickki Hearing, BA, ACSM CEP, Exercise Physiologist;Jessica Luan Pulling, MA, RCEP, CCRP, CCET;Shawntay Prest RCP,RRT,BSRT    Virtual Visit  No    Medication changes reported      No    Fall or balance concerns reported     No    Warm-up and Cool-down  Performed on first and last piece of equipment    Resistance Training Performed  Yes    VAD Patient?  No    PAD/SET Patient?  No      Pain Assessment   Currently in Pain?  No/denies          Social History   Tobacco Use  Smoking Status Never Smoker  Smokeless Tobacco Never Used    Goals Met:  Independence with exercise equipment Exercise tolerated well No report of cardiac concerns or symptoms Strength training completed today  Goals Unmet:  Not Applicable  Comments: Pt able to follow exercise prescription today without complaint.  Will continue to monitor for progression.    Dr. Emily Filbert is Medical Director for [place] and [facility].

## 2020-03-13 ENCOUNTER — Encounter: Payer: Self-pay | Admitting: *Deleted

## 2020-03-13 DIAGNOSIS — Z951 Presence of aortocoronary bypass graft: Secondary | ICD-10-CM

## 2020-03-13 DIAGNOSIS — I214 Non-ST elevation (NSTEMI) myocardial infarction: Secondary | ICD-10-CM

## 2020-03-13 NOTE — Progress Notes (Signed)
Cardiac Individual Treatment Plan  Patient Details  Name: [name] MRN: [id number] Date of Birth: [birth year] Referring Provider:     Cardiac Rehab from 03/07/2020 in [facility] Cardiac and Pulmonary Rehab  Referring Provider  Kathlyn Sacramento MD      Initial Encounter Date:    Cardiac Rehab from 03/07/2020 in [facility] Cardiac and Pulmonary Rehab  Date  03/07/20      Visit Diagnosis: NSTEMI (non-ST elevated myocardial infarction) ([place])  S/P CABG x 4  Patient's Home Medications on Admission:  Current Outpatient Medications:  .  acetaminophen (TYLENOL) 325 MG tablet, Take 325 mg by mouth as needed., Disp: , Rfl:  .  aspirin EC 81 MG tablet, Take 81 mg by mouth daily., Disp: , Rfl:  .  ezetimibe (ZETIA) 10 MG tablet, Take 1 tablet (10 mg total) by mouth daily., Disp: 90 tablet, Rfl: 3 .  ferrous [id number]-HERDEYC C-folic acid (TRINSICON / FOLTRIN) capsule, Take 1 capsule by mouth 2 (two) times daily after a meal. (Patient not taking: Reported on 03/07/2020), Disp: 60 capsule, Rfl: 2 .  hyoscyamine (ANASPAZ) 0.125 MG TBDP disintergrating tablet, Place 0.125 mg under the tongue 2 (two) times daily as needed for bladder spasms., Disp: , Rfl:  .  lisinopril (ZESTRIL) 2.5 MG tablet, TAKE 1 TABLET(2.5 MG) BY MOUTH DAILY, Disp: 90 tablet, Rfl: 1 .  lovastatin (MEVACOR) 20 MG tablet, Take 20 mg by mouth daily. , Disp: , Rfl:  .  metoprolol succinate (TOPROL-XL) 50 MG 24 hr tablet, Take 1 tablet (50 mg total) by mouth daily. Take with or immediately following a meal., Disp: 30 tablet, Rfl: 3 .  metoprolol tartrate (LOPRESSOR) 25 MG tablet, Take 12.5 mg by mouth 2 (two) times daily as needed ("heart flutter")., Disp: , Rfl:  .  Multiple Vitamin (MULTIVITAMIN WITH MINERALS) TABS tablet, Take 1 tablet by mouth daily., Disp:  , Rfl:  .  Psyllium (METAMUCIL PO), Take 1 Dose by mouth at bedtime. Mix with water, Disp: , Rfl:  .  rivaroxaban (XARELTO) 20 MG TABS tablet, Take 1 tablet (20 mg total) by mouth  daily., Disp: 90 tablet, Rfl: 1  Past Medical History: Past Medical History:  Diagnosis Date  . Arthritis   . Bone spur 2010   right shoulder  . CAD (coronary artery disease)    a. 12/2019 NSTEMI/Cath: LM 99ost/m, LAD 40, LCX nl, OM1 80, RCA 70ost/p, 99p, 30d, EF 45-50%; b. 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  . Hyperlipidemia   . Hypertension   . IBS (irritable bowel syndrome)   . Ischemic cardiomyopathy    a. 12/2019 TEE: EF 45-50%. Nl RV fxn. Mild MR.    Tobacco Use: Social History   Tobacco Use  Smoking Status Never Smoker  Smokeless Tobacco Never Used    Labs: Recent Review Flowsheet Data    Labs for ITP Cardiac and Pulmonary Rehab Latest Ref Rng & Units 01/09/2020 01/09/2020 01/10/2020 01/11/2020 02/16/2020   Cholestrol 0 - 200 mg/dL - - - - 188   LDLCALC 0 - 99 mg/dL - - - - 135(H)   LDLDIRECT mg/dL - - - - -   HDL >40 mg/dL - - - - 39(L)   Trlycerides <150 mg/dL - - - - 68   Hemoglobin A1c 4.8 - 5.6 % - - - - -   PHART 7.350 - 7.450 7.455(H) - - - -   PCO2ART 32.0 - 48.0 mmHg 31.3(L) - - - -   HCO3 20.0 - 28.0  mmol/L 21.8 - - - -   TCO2 22 - 32 mmol/L 23 - - - -   ACIDBASEDEF 0.0 - 2.0 mmol/L 1.0 - - - -   O2SAT % 96.0 54.2 61.2 63.5 -       Exercise Target Goals: Exercise Program Goal: Individual exercise prescription set using results from initial 6 min walk test and THRR while considering  patient's activity barriers and safety.   Exercise Prescription Goal: Initial exercise prescription builds to 30-45 minutes a day of aerobic activity, 2-3 days per week.  Home exercise guidelines will be given to patient during program as part of exercise prescription that the participant will acknowledge.  Activity Barriers & Risk Stratification: Activity Barriers & Cardiac Risk Stratification - 03/07/20 1033      Activity Barriers & Cardiac Risk Stratification   Activity Barriers  Arthritis;Deconditioning;Muscular Weakness;Joint Problems;Incisional Pain     Cardiac Risk Stratification  High       6 Minute Walk: 6 Minute Walk    Row Name 03/07/20 1032         6 Minute Walk   Phase  Initial     Distance  1530 feet     Walk Time  6 minutes     # of Rest Breaks  0     MPH  2.9     METS  3.13     RPE  9     VO2 Peak  10.96     Symptoms  No     Resting HR  54 bpm     Resting BP  124/72     Resting Oxygen Saturation   97 %     Exercise Oxygen Saturation  during 6 min walk  100 %     Max Ex. HR  90 bpm     Max Ex. BP  136/74     2 Minute Post BP  112/56        Oxygen Initial Assessment:   Oxygen Re-Evaluation:   Oxygen Discharge (Final Oxygen Re-Evaluation):   Initial Exercise Prescription: Initial Exercise Prescription - 03/07/20 1000      Date of Initial Exercise RX and Referring Provider   Date  03/07/20    Referring Provider  Kathlyn Sacramento MD      Treadmill   MPH  2.8    Grade  0    Minutes  15    METs  3.1      Recumbant Bike   Level  2    RPM  50    Watts  22    Minutes  15    METs  3      NuStep   Level  2    SPM  80    Minutes  15    METs  3      REL-XR   Level  2    Speed  50    Minutes  15    METs  3      Prescription Details   Frequency (times per week)  2    Duration  Progress to 30 minutes of continuous aerobic without signs/symptoms of physical distress      Intensity   THRR 40-80% of Max Heartrate  89-124    Ratings of Perceived Exertion  11-13    Perceived Dyspnea  0-4      Progression   Progression  Continue to progress workloads to maintain intensity without signs/symptoms of physical distress.  Resistance Training   Training Prescription  Yes    Weight  3 lb    Reps  10-15       Perform Capillary Blood Glucose checks as needed.  Exercise Prescription Changes: Exercise Prescription Changes    Row Name 03/07/20 1000 03/12/20 1300           Response to Exercise   Blood Pressure (Admit)  124/72  134/58      Blood Pressure (Exercise)  136/74  124/64       Blood Pressure (Exit)  112/56  132/60      Heart Rate (Admit)  54 bpm  66 bpm      Heart Rate (Exercise)  90 bpm  102 bpm      Heart Rate (Exit)  54 bpm  86 bpm      Oxygen Saturation (Admit)  97 %  --      Oxygen Saturation (Exercise)  100 %  --      Rating of Perceived Exertion (Exercise)  9  11      Symptoms  none  none      Comments  walk test results  --      Duration  --  Continue with 30 min of aerobic exercise without signs/symptoms of physical distress.      Intensity  --  THRR unchanged        Progression   Progression  --  Continue to progress workloads to maintain intensity without signs/symptoms of physical distress.      Average METs  --  2.85        Resistance Training   Training Prescription  --  Yes      Weight  --  3 lb      Reps  --  10-15        Interval Training   Interval Training  --  No        Recumbant Bike   Level  --  4      RPM  --  50      Minutes  --  15      METs  --  3.03        NuStep   Level  --  4      SPM  --  80      Minutes  --  15      METs  --  2.7         Exercise Comments:   Exercise Goals and Review: Exercise Goals    Row Name 03/07/20 1057             Exercise Goals   Increase Physical Activity  Yes       Intervention  Provide advice, education, support and counseling about physical activity/exercise needs.;Develop an individualized exercise prescription for aerobic and resistive training based on initial evaluation findings, risk stratification, comorbidities and participant's personal goals.       Expected Outcomes  Long Term: Add in home exercise to make exercise part of routine and to increase amount of physical activity.;Short Term: Attend rehab on a regular basis to increase amount of physical activity.;Long Term: Exercising regularly at least 3-5 days a week.       Increase Strength and Stamina  Yes       Intervention  Provide advice, education, support and counseling about physical activity/exercise  needs.;Develop an individualized exercise prescription for aerobic and resistive training based on initial evaluation findings, risk stratification, comorbidities and  participant's personal goals.       Expected Outcomes  Short Term: Increase workloads from initial exercise prescription for resistance, speed, and METs.;Short Term: Perform resistance training exercises routinely during rehab and add in resistance training at home;Long Term: Improve cardiorespiratory fitness, muscular endurance and strength as measured by increased METs and functional capacity (6MWT)       Able to understand and use rate of perceived exertion (RPE) scale  Yes       Intervention  Provide education and explanation on how to use RPE scale       Expected Outcomes  Long Term:  Able to use RPE to guide intensity level when exercising independently;Short Term: Able to use RPE daily in rehab to express subjective intensity level       Able to understand and use Dyspnea scale  Yes       Intervention  Provide education and explanation on how to use Dyspnea scale       Expected Outcomes  Short Term: Able to use Dyspnea scale daily in rehab to express subjective sense of shortness of breath during exertion;Long Term: Able to use Dyspnea scale to guide intensity level when exercising independently       Knowledge and understanding of Target Heart Rate Range (THRR)  Yes       Intervention  Provide education and explanation of THRR including how the numbers were predicted and where they are located for reference       Expected Outcomes  Short Term: Able to state/look up THRR;Short Term: Able to use daily as guideline for intensity in rehab;Long Term: Able to use THRR to govern intensity when exercising independently       Able to check pulse independently  Yes       Intervention  Provide education and demonstration on how to check pulse in carotid and radial arteries.;Review the importance of being able to check your own pulse for safety  during independent exercise       Expected Outcomes  Short Term: Able to explain why pulse checking is important during independent exercise;Long Term: Able to check pulse independently and accurately       Understanding of Exercise Prescription  Yes       Intervention  Provide education, explanation, and written materials on patient's individual exercise prescription       Expected Outcomes  Short Term: Able to explain program exercise prescription;Long Term: Able to explain home exercise prescription to exercise independently          Exercise Goals Re-Evaluation : Exercise Goals Re-Evaluation    Row Name 03/12/20 1334             Exercise Goal Re-Evaluation   Exercise Goals Review  Increase Physical Activity;Increase Strength and Stamina;Able to understand and use rate of perceived exertion (RPE) scale;Able to understand and use Dyspnea scale;Knowledge and understanding of Target Heart Rate Range (THRR);Able to check pulse independently;Understanding of Exercise Prescription       Comments  Reviewed RPE scale, THR and program prescription with pt today.  Pt voiced understanding and was given a copy of goals to take home.       Expected Outcomes  Short: Use RPE daily to regulate intensity. Long: Follow program prescription in THR.          Discharge Exercise Prescription (Final Exercise Prescription Changes): Exercise Prescription Changes - 03/12/20 1300      Response to Exercise   Blood Pressure (Admit)  134/58  Blood Pressure (Exercise)  124/64    Blood Pressure (Exit)  132/60    Heart Rate (Admit)  66 bpm    Heart Rate (Exercise)  102 bpm    Heart Rate (Exit)  86 bpm    Rating of Perceived Exertion (Exercise)  11    Symptoms  none    Duration  Continue with 30 min of aerobic exercise without signs/symptoms of physical distress.    Intensity  THRR unchanged      Progression   Progression  Continue to progress workloads to maintain intensity without signs/symptoms of  physical distress.    Average METs  2.85      Resistance Training   Training Prescription  Yes    Weight  3 lb    Reps  10-15      Interval Training   Interval Training  No      Recumbant Bike   Level  4    RPM  50    Minutes  15    METs  3.03      NuStep   Level  4    SPM  80    Minutes  15    METs  2.7       Nutrition:  Target Goals: Understanding of nutrition guidelines, daily intake of sodium <1525m, cholesterol <2077m calories 30% from fat and 7% or less from saturated fats, daily to have 5 or more servings of fruits and vegetables.  Biometrics: Pre Biometrics - 03/07/20 1058      Pre Biometrics   Height  5' 6.5" (1.689 m)    Weight  135 lb 3.2 oz (61.3 kg)    BMI (Calculated)  21.5    Single Leg Stand  30 seconds        Nutrition Therapy Plan and Nutrition Goals:   Nutrition Assessments: Nutrition Assessments - 03/07/20 1225      MEDFICTS Scores   Pre Score  36       Nutrition Goals Re-Evaluation:   Nutrition Goals Discharge (Final Nutrition Goals Re-Evaluation):   Psychosocial: Target Goals: Acknowledge presence or absence of significant depression and/or stress, maximize coping skills, provide positive support system. Participant is able to verbalize types and ability to use techniques and skills needed for reducing stress and depression.   Initial Review & Psychosocial Screening: Initial Psych Review & Screening - 03/06/20 1108      Initial Review   Current issues with  Current Stress Concerns    Source of Stress Concerns  Financial;Family;Chronic Illness;Unable to participate in former interests or hobbies;Unable to perform yard/household activities    Comments  Taxes are always stressful, not currently able to get out to garden,  family strain for finances as mother is [id number] running out of [place]   wife, brother and sister nearby as well     Barriers   Psychosocial barriers to  participate in program  The patient should benefit from training in stress management and relaxation.;Psychosocial barriers identified (see note)      Screening Interventions   Interventions  Encouraged to exercise;To provide support and resources with identified psychosocial needs;Provide feedback about the scores to participant    Expected Outcomes  Short Term goal: Utilizing psychosocial counselor, staff and physician to assist with identification of specific Stressors or current issues interfering with healing process. Setting desired goal for each stressor or current issue identified.;Long Term Goal: Stressors or  current issues are controlled or eliminated.;Short Term goal: Identification and review with participant of any Quality of Life or Depression concerns found by scoring the questionnaire.;Long Term goal: The participant improves quality of Life and PHQ9 Scores as seen by post scores and/or verbalization of changes       Quality of Life Scores:  Quality of Life - 03/07/20 1224      Quality of Life   Select  Quality of Life      Quality of Life Scores   Health/Function Pre  29.57 %    Socioeconomic Pre  30 %    Psych/Spiritual Pre  30 %    Family Pre  30 %    GLOBAL Pre  29.81 %      Scores of 19 and below usually indicate a poorer quality of life in these areas.  A difference of  2-3 points is a clinically meaningful difference.  A difference of 2-3 points in the total score of the Quality of Life Index has been associated with significant improvement in overall quality of life, self-image, physical symptoms, and general health in studies assessing change in quality of life.  PHQ-9: Recent Review Flowsheet Data    Depression screen [facility] 2/9 03/07/2020 06/02/2017 05/25/2016 05/21/2015 05/21/2015   Decreased Interest 0 0 0 0 0   Down, Depressed, Hopeless 0 0 0 0 0   PHQ - 2 Score 0 0 0 0 0   Altered sleeping 0 - - - -   Tired, decreased energy 0 - - - -   Change in appetite 0 - - -  -   Feeling bad or failure about yourself  0 - - - -   Trouble concentrating 0 - - - -   Moving slowly or fidgety/restless 0 - - - -   Suicidal thoughts 0 - - - -   PHQ-9 Score 0 - - - -   Difficult doing work/chores Not difficult at all - - - -     Interpretation of Total Score  Total Score Depression Severity:  1-4 = Minimal depression, 5-9 = Mild depression, 10-14 = Moderate depression, 15-19 = Moderately severe depression, 20-27 = Severe depression   Psychosocial Evaluation and Intervention: Psychosocial Evaluation - 03/06/20 1114      Psychosocial Evaluation & Interventions   Interventions  Stress management education;Encouraged to exercise with the program and follow exercise prescription    Comments  Delfino Lovett is coming into cardiac rehab after a heart attack and by-pass sugery.  He is doing well with his recovery, but eager to get back to his garden.  He feels like he has lost his strength and muscle tone and eager to regain them back to be able to get out into the garden.  His wife and family are big supporters of him.  However, they do have one major family and financial strain.  His mother is 77 in poor health and has dementia. She has four aides that help watch her around the clock but she is running out of money from insurance to cover her care and the family is trying to figure out what to do.  He also has $35 copay for rehab and are a little worried about that as well.  He is going to aim for two days a week to try to help keep cost lower for him.  He lost his glasses in transfer from [facility] to [facility] for surgery and is scheduled for cataract surgery soon so just  using old glasses currently.    Expected Outcomes  Short: Attend rehab to rebuild strength  Long: Continue to recover to be able to garden again.    Continue Psychosocial Services   Follow up required by staff       Psychosocial Re-Evaluation:   Psychosocial Discharge (Final Psychosocial Re-Evaluation):   Vocational  Rehabilitation: Provide vocational rehab assistance to qualifying candidates.   Vocational Rehab Evaluation & Intervention:   Education: Education Goals: Education classes will be provided on a variety of topics geared toward better understanding of heart health and risk factor modification. Participant will state understanding/return demonstration of topics presented as noted by education test scores.  Learning Barriers/Preferences: Learning Barriers/Preferences - 03/06/20 1106      Learning Barriers/Preferences   Learning Barriers  Sight;Hearing   HOH, lost glasses with surgery cataract surgery in [place]   Learning Preferences  None       Education Topics:  AED/CPR: - Group verbal and written instruction with the use of models to demonstrate the basic use of the AED with the basic ABC's of resuscitation.   General Nutrition Guidelines/Fats and Fiber: -Group instruction provided by verbal, written material, models and posters to present the general guidelines for heart healthy nutrition. Gives an explanation and review of dietary fats and fiber.   Controlling Sodium/Reading Food Labels: -Group verbal and written material supporting the discussion of sodium use in heart healthy nutrition. Review and explanation with models, verbal and written materials for utilization of the food label.   Exercise Physiology & General Exercise Guidelines: - Group verbal and written instruction with models to review the exercise physiology of the cardiovascular system and associated critical values. Provides general exercise guidelines with specific guidelines to those with heart or lung disease.    Aerobic Exercise & Resistance Training: - Gives group verbal and written instruction on the various components of exercise. Focuses on aerobic and resistive training programs and the benefits of this training and how to safely progress through these programs..   Flexibility, Balance, Mind/Body  Relaxation: Provides group verbal/written instruction on the benefits of flexibility and balance training, including mind/body exercise modes such as yoga, pilates and tai chi.  Demonstration and skill practice provided.   Stress and Anxiety: - Provides group verbal and written instruction about the health risks of elevated stress and causes of high stress.  Discuss the correlation between heart/lung disease and anxiety and treatment options. Review healthy ways to manage with stress and anxiety.   Depression: - Provides group verbal and written instruction on the correlation between heart/lung disease and depressed mood, treatment options, and the stigmas associated with seeking treatment.   Anatomy & Physiology of the Heart: - Group verbal and written instruction and models provide basic cardiac anatomy and physiology, with the coronary electrical and arterial systems. Review of Valvular disease and Heart Failure   Cardiac Procedures: - Group verbal and written instruction to review commonly prescribed medications for heart disease. Reviews the medication, class of the drug, and side effects. Includes the steps to properly store meds and maintain the prescription regimen. (beta blockers and nitrates)   Cardiac Medications I: - Group verbal and written instruction to review commonly prescribed medications for heart disease. Reviews the medication, class of the drug, and side effects. Includes the steps to properly store meds and maintain the prescription regimen.   Cardiac Medications II: -Group verbal and written instruction to review commonly prescribed medications for heart disease. Reviews the medication, class of the drug, and  side effects. (all other drug classes)    Go Sex-Intimacy & Heart Disease, Get SMART - Goal Setting: - Group verbal and written instruction through game format to discuss heart disease and the return to sexual intimacy. Provides group verbal and written  material to discuss and apply goal setting through the application of the S.M.A.R.T. Method.   Other Matters of the Heart: - Provides group verbal, written materials and models to describe Stable Angina and Peripheral Artery. Includes description of the disease process and treatment options available to the cardiac patient.   Exercise & Equipment Safety: - Individual verbal instruction and demonstration of equipment use and safety with use of the equipment.   Cardiac Rehab from 03/07/2020 in [facility] Cardiac and Pulmonary Rehab  Date  03/07/20  Educator  [facility]  Instruction Review Code  1- Verbalizes Understanding      Infection Prevention: - Provides verbal and written material to individual with discussion of infection control including proper hand washing and proper equipment cleaning during exercise session.   Cardiac Rehab from 03/07/2020 in [facility] Cardiac and Pulmonary Rehab  Date  03/07/20  Educator  [facility]  Instruction Review Code  1- Verbalizes Understanding      Falls Prevention: - Provides verbal and written material to individual with discussion of falls prevention and safety.   Cardiac Rehab from 03/07/2020 in [facility] Cardiac and Pulmonary Rehab  Date  03/07/20  Educator  [facility]  Instruction Review Code  1- Verbalizes Understanding      Diabetes: - Individual verbal and written instruction to review signs/symptoms of diabetes, desired ranges of glucose level fasting, after meals and with exercise. Acknowledge that pre and post exercise glucose checks will be done for 3 sessions at entry of program.   Know Your Numbers and Risk Factors: -Group verbal and written instruction about important numbers in your health.  Discussion of what are risk factors and how they play a role in the disease process.  Review of Cholesterol, Blood Pressure, Diabetes, and BMI and the role they play in your overall health.   Sleep Hygiene: -Provides group verbal and written instruction about how sleep can  affect your health.  Define sleep hygiene, discuss sleep cycles and impact of sleep habits. Review good sleep hygiene tips.    Other: -Provides group and verbal instruction on various topics (see comments)   Knowledge Questionnaire Score: Knowledge Questionnaire Score - 03/07/20 1225      Knowledge Questionnaire Score   Pre Score  22/26 Education Focus: pulse, MI, PAD, exercise       Core Components/Risk Factors/Patient Goals at Admission: Personal Goals and Risk Factors at Admission - 03/07/20 1226      Core Components/Risk Factors/Patient Goals on Admission    Weight Management  Yes;Weight Maintenance    Intervention  Weight Management: Develop a combined nutrition and exercise program designed to reach desired caloric intake, while maintaining appropriate intake of nutrient and fiber, sodium and fats, and appropriate energy expenditure required for the weight goal.;Weight Management: Provide education and appropriate resources to help participant work on and attain dietary goals.    Admit Weight  135 lb 3.2 oz (61.3 kg)    Goal Weight: Short Term  135 lb (61.2 kg)    Goal Weight: Long Term  135 lb (61.2 kg)    Expected Outcomes  Short Term: Continue to assess and modify interventions until short term weight is achieved;Long Term: Adherence to nutrition and physical activity/exercise program aimed toward attainment of established weight goal;Weight  Maintenance: Understanding of the daily nutrition guidelines, which includes 25-35% calories from fat, 7% or less cal from saturated fats, less than 221m cholesterol, less than 1.5gm of sodium, & 5 or more servings of fruits and vegetables daily    Hypertension  Yes    Intervention  Provide education on lifestyle modifcations including regular physical activity/exercise, weight management, moderate sodium restriction and increased consumption of fresh fruit, vegetables, and low fat dairy, alcohol moderation, and smoking cessation.;Monitor  prescription use compliance.    Expected Outcomes  Short Term: Continued assessment and intervention until BP is < 140/936mHG in hypertensive participants. < 130/8062mG in hypertensive participants with diabetes, heart failure or chronic kidney disease.;Long Term: Maintenance of blood pressure at goal levels.    Lipids  Yes    Intervention  Provide education and support for participant on nutrition & aerobic/resistive exercise along with prescribed medications to achieve LDL <62m56mDL >40mg32m Expected Outcomes  Short Term: Participant states understanding of desired cholesterol values and is compliant with medications prescribed. Participant is following exercise prescription and nutrition guidelines.;Long Term: Cholesterol controlled with medications as prescribed, with individualized exercise RX and with personalized nutrition plan. Value goals: LDL < 62mg,74m > 40 mg.       Core Components/Risk Factors/Patient Goals Review:    Core Components/Risk Factors/Patient Goals at Discharge (Final Review):    ITP Comments: ITP Comments    Row Name 03/06/20 1123 03/07/20 1032 03/13/20 0646       ITP Comments  Completed virtual orientation today.  EP evaluation is scheduled for Thursday 3/18 at 930.  [facility]  Completed 6MWT and gym orientation.  Initial ITP created and sent for review to Dr. Mark MEmily Filbertcal Director.  30 day chart review completed. ITP sent to Dr M MillZachery Dakinsal Director, for review,changes as needed and signature. Continue with ITP if no changes requested        Comments:

## 2020-03-14 ENCOUNTER — Encounter: Payer: Medicare PPO | Admitting: *Deleted

## 2020-03-14 ENCOUNTER — Other Ambulatory Visit: Payer: Self-pay

## 2020-03-14 DIAGNOSIS — I214 Non-ST elevation (NSTEMI) myocardial infarction: Secondary | ICD-10-CM

## 2020-03-14 DIAGNOSIS — Z951 Presence of aortocoronary bypass graft: Secondary | ICD-10-CM

## 2020-03-14 NOTE — Progress Notes (Signed)
Daily Session Note  Patient Details  Name: [name] MRN: [id number] Date of Birth: March 03, 1941 Referring Provider:     Cardiac Rehab from 03/07/2020 in [facility]  Referring Provider  Kathlyn Sacramento MD      Encounter Date: 03/14/2020  Check In: Session Check In - 03/14/20 0813      Check-In   Supervising physician immediately available to respond to emergencies  See telemetry face sheet for immediately available ER MD    Location  [facility]-Cardiac & Pulmonary Rehab    Staff Present  Heath Lark, RN, BSN, CCRP;Jessica Salvo, MA, RCEP, CCRP, CCET    Virtual Visit  No    Medication changes reported      No    Fall or balance concerns reported     No    Warm-up and Cool-down  Performed on first and last piece of equipment    Resistance Training Performed  Yes    VAD Patient?  No    PAD/SET Patient?  No      Pain Assessment   Currently in Pain?  No/denies          Social History   Tobacco Use  Smoking Status Never Smoker  Smokeless Tobacco Never Used    Goals Met:  Exercise tolerated well No report of cardiac concerns or symptoms  Goals Unmet:  Not Applicable  Comments: Pt able to follow exercise prescription today without complaint.  Will continue to monitor for progression.    Dr. Emily Filbert is Medical Director for [place] and [facility].

## 2020-03-18 ENCOUNTER — Telehealth: Payer: Self-pay

## 2020-03-18 NOTE — Telephone Encounter (Signed)
Patient made aware of cardiac monitor results with verbalized understanding.

## 2020-03-18 NOTE — Telephone Encounter (Signed)
-----   Message from Kate Sable, MD sent at 03/18/2020  3:13 PM EDT ----- No evidence for atrial fibrillation or atrial flutter noted.  Keep follow-up appointment.

## 2020-03-19 ENCOUNTER — Other Ambulatory Visit: Payer: Self-pay

## 2020-03-19 ENCOUNTER — Encounter: Payer: Medicare PPO | Admitting: *Deleted

## 2020-03-19 DIAGNOSIS — I214 Non-ST elevation (NSTEMI) myocardial infarction: Secondary | ICD-10-CM | POA: Diagnosis not present

## 2020-03-19 DIAGNOSIS — Z951 Presence of aortocoronary bypass graft: Secondary | ICD-10-CM

## 2020-03-19 NOTE — Progress Notes (Signed)
Daily Session Note  Patient Details  Name: [name] MRN: [id number] Date of Birth: 07-17-1941 Referring Provider:     Cardiac Rehab from 03/07/2020 in [facility] Cardiac and Pulmonary Rehab  Referring Provider  Kathlyn Sacramento MD      Encounter Date: 03/19/2020  Check In: Session Check In - 03/19/20 0017      Check-In   Supervising physician immediately available to respond to emergencies  See telemetry face sheet for immediately available ER MD    Location  [facility]-Cardiac & Pulmonary Rehab    Staff Present  Heath Lark, RN, BSN, CCRP;Jessica [place], MA, RCEP, CCRP, CCET;Joseph [place] RCP,RRT,BSRT    Virtual Visit  No    Medication changes reported      No    Fall or balance concerns reported     No    Warm-up and Cool-down  Performed on first and last piece of equipment    Resistance Training Performed  Yes    VAD Patient?  No    PAD/SET Patient?  No      Pain Assessment   Currently in Pain?  No/denies          Social History   Tobacco Use  Smoking Status Never Smoker  Smokeless Tobacco Never Used    Goals Met:  Independence with exercise equipment Exercise tolerated well No report of cardiac concerns or symptoms  Goals Unmet:  Not Applicable  Comments: Pt able to follow exercise prescription today without complaint.  Will continue to monitor for progression.    Dr. Emily Filbert is Medical Director for [place] and [facility].

## 2020-03-21 ENCOUNTER — Encounter: Payer: Medicare PPO | Attending: Cardiovascular Disease | Admitting: *Deleted

## 2020-03-21 ENCOUNTER — Other Ambulatory Visit: Payer: Self-pay

## 2020-03-21 DIAGNOSIS — Z79899 Other long term (current) drug therapy: Secondary | ICD-10-CM | POA: Insufficient documentation

## 2020-03-21 DIAGNOSIS — Z7982 Long term (current) use of aspirin: Secondary | ICD-10-CM | POA: Diagnosis not present

## 2020-03-21 DIAGNOSIS — Z7901 Long term (current) use of anticoagulants: Secondary | ICD-10-CM | POA: Insufficient documentation

## 2020-03-21 DIAGNOSIS — M1990 Unspecified osteoarthritis, unspecified site: Secondary | ICD-10-CM | POA: Diagnosis not present

## 2020-03-21 DIAGNOSIS — Z951 Presence of aortocoronary bypass graft: Secondary | ICD-10-CM | POA: Insufficient documentation

## 2020-03-21 DIAGNOSIS — I2510 Atherosclerotic heart disease of native coronary artery without angina pectoris: Secondary | ICD-10-CM | POA: Insufficient documentation

## 2020-03-21 DIAGNOSIS — E785 Hyperlipidemia, unspecified: Secondary | ICD-10-CM | POA: Insufficient documentation

## 2020-03-21 DIAGNOSIS — K589 Irritable bowel syndrome without diarrhea: Secondary | ICD-10-CM | POA: Diagnosis not present

## 2020-03-21 DIAGNOSIS — I10 Essential (primary) hypertension: Secondary | ICD-10-CM | POA: Insufficient documentation

## 2020-03-21 DIAGNOSIS — I214 Non-ST elevation (NSTEMI) myocardial infarction: Secondary | ICD-10-CM | POA: Diagnosis present

## 2020-03-21 NOTE — Progress Notes (Signed)
Daily Session Note  Patient Details  Name: [name] MRN: [id number] Date of Birth: December 02, 1941 Referring Provider:     Cardiac Rehab from 03/07/2020 in [facility]  Referring Provider  Kathlyn Sacramento MD      Encounter Date: 03/21/2020  Check In: Session Check In - 03/21/20 0803      Check-In   Staff Present  Nyoka Cowden, RN, BSN, [place], BS, RRT, CPFT;Melissa Caiola RDN, LDN;Susanne Bice, RN, BSN, CCRP    Virtual Visit  No    Medication changes reported      No    Fall or balance concerns reported     No    Warm-up and Cool-down  Performed on first and last piece of equipment    Resistance Training Performed  Yes    VAD Patient?  No    PAD/SET Patient?  No      Pain Assessment   Currently in Pain?  No/denies          Social History   Tobacco Use  Smoking Status Never Smoker  Smokeless Tobacco Never Used    Goals Met:  Independence with exercise equipment Exercise tolerated well No report of cardiac concerns or symptoms  Goals Unmet:  Not Applicable  Comments: Pt able to follow exercise prescription today without complaint.  Will continue to monitor for progression.   Dr. Emily Filbert is Medical Director for [place] and [facility].

## 2020-03-26 ENCOUNTER — Encounter: Payer: Medicare PPO | Admitting: *Deleted

## 2020-03-26 ENCOUNTER — Other Ambulatory Visit: Payer: Self-pay

## 2020-03-26 DIAGNOSIS — I214 Non-ST elevation (NSTEMI) myocardial infarction: Secondary | ICD-10-CM | POA: Diagnosis not present

## 2020-03-26 DIAGNOSIS — Z951 Presence of aortocoronary bypass graft: Secondary | ICD-10-CM

## 2020-03-26 NOTE — Progress Notes (Signed)
Daily Session Note  Patient Details  Name: [name] MRN: [id number] Date of Birth: Feb 28, 1941 Referring Provider:     Cardiac Rehab from 03/07/2020 in [facility] Cardiac and Pulmonary Rehab  Referring Provider  Johnathan Sacramento MD      Encounter Date: 03/26/2020  Check In: Session Check In - 03/26/20 0823      Check-In   Supervising physician immediately available to respond to emergencies  See telemetry face sheet for immediately available ER MD    Location  [facility]-Cardiac & Pulmonary Rehab    Staff Present  Heath Lark, RN, BSN, CCRP;Joseph Foy [place], [place], ACSM CEP, Exercise Physiologist    Virtual Visit  No    Medication changes reported      No    Fall or balance concerns reported     No    Warm-up and Cool-down  Performed on first and last piece of equipment    Resistance Training Performed  Yes    VAD Patient?  No    PAD/SET Patient?  No      Pain Assessment   Currently in Pain?  No/denies          Social History   Tobacco Use  Smoking Status Never Smoker  Smokeless Tobacco Never Used    Goals Met:  Independence with exercise equipment Exercise tolerated well No report of cardiac concerns or symptoms  Goals Unmet:  Not Applicable  Comments: Pt able to follow exercise prescription today without complaint.  Will continue to monitor for progression.    Dr. Emily Cook is Medical Director for [name].

## 2020-03-28 ENCOUNTER — Other Ambulatory Visit: Payer: Self-pay

## 2020-03-28 DIAGNOSIS — I214 Non-ST elevation (NSTEMI) myocardial infarction: Secondary | ICD-10-CM | POA: Diagnosis not present

## 2020-03-28 DIAGNOSIS — Z951 Presence of aortocoronary bypass graft: Secondary | ICD-10-CM

## 2020-03-28 NOTE — Progress Notes (Signed)
Daily Session Note  Patient Details  Name: Linwood Gullikson Degeorge MRN: [id number] Date of Birth: [birth year] Referring Provider:     Cardiac Rehab from 03/07/2020 in [facility] Cardiac and Pulmonary Rehab  Referring Provider  Kathlyn Sacramento MD      Encounter Date: 03/28/2020  Check In: Session Check In - 03/28/20 0815      Check-In   Supervising physician immediately available to respond to emergencies  See telemetry face sheet for immediately available ER MD    Location  [facility]-Cardiac & Pulmonary Rehab    Staff Present  Vida Rigger RN, Vickki Hearing, BA, ACSM CEP, Exercise Physiologist;Melissa Caiola RDN, LDN;Susanne Bice, RN, BSN, CCRP    Virtual Visit  No    Medication changes reported      No    Fall or balance concerns reported     No    Warm-up and Cool-down  Performed on first and last piece of equipment    Resistance Training Performed  Yes    VAD Patient?  No    PAD/SET Patient?  No      Pain Assessment   Currently in Pain?  No/denies          Social History   Tobacco Use  Smoking Status Never Smoker  Smokeless Tobacco Never Used    Goals Met:  Proper associated with RPD/PD & O2 Sat Independence with exercise equipment Exercise tolerated well No report of cardiac concerns or symptoms Strength training completed today  Goals Unmet:  Not Applicable  Comments: Pt able to follow exercise prescription today without complaint.  Will continue to monitor for progression. Delfino Lovett is discharging today. Having eye surgery and then leaving for his place in [place] for extended stay. He was offered help looking for a program in [place]. He declined that offer.   Dr. Emily Filbert is Medical Director for [place] and [facility].

## 2020-03-28 NOTE — Progress Notes (Signed)
Cardiac Individual Treatment Plan  Patient Details  Name: Ruble Pumphrey Hemp MRN: [id number] Date of Birth: [birth year] Referring Provider:     Cardiac Rehab from 03/07/2020 in [facility] Cardiac and Pulmonary Rehab  Referring Provider  Kathlyn Sacramento MD      Initial Encounter Date:    Cardiac Rehab from 03/07/2020 in [facility] Cardiac and Pulmonary Rehab  Date  03/07/20      Visit Diagnosis: NSTEMI (non-ST elevated myocardial infarction) ([place])  S/P CABG x 4  Patient's Home Medications on Admission:  Current Outpatient Medications:  .  acetaminophen (TYLENOL) 325 MG tablet, Take 325 mg by mouth as needed., Disp: , Rfl:  .  aspirin EC 81 MG tablet, Take 81 mg by mouth daily., Disp: , Rfl:  .  ezetimibe (ZETIA) 10 MG tablet, Take 1 tablet (10 mg total) by mouth daily., Disp: 90 tablet, Rfl: 3 .  ferrous [id number]-HERDEYC C-folic acid (TRINSICON / FOLTRIN) capsule, Take 1 capsule by mouth 2 (two) times daily after a meal. (Patient not taking: Reported on 03/07/2020), Disp: 60 capsule, Rfl: 2 .  hyoscyamine (ANASPAZ) 0.125 MG TBDP disintergrating tablet, Place 0.125 mg under the tongue 2 (two) times daily as needed for bladder spasms., Disp: , Rfl:  .  lisinopril (ZESTRIL) 2.5 MG tablet, TAKE 1 TABLET(2.5 MG) BY MOUTH DAILY, Disp: 90 tablet, Rfl: 1 .  lovastatin (MEVACOR) 20 MG tablet, Take 20 mg by mouth daily. , Disp: , Rfl:  .  metoprolol succinate (TOPROL-XL) 50 MG 24 hr tablet, Take 1 tablet (50 mg total) by mouth daily. Take with or immediately following a meal., Disp: 30 tablet, Rfl: 3 .  metoprolol tartrate (LOPRESSOR) 25 MG tablet, Take 12.5 mg by mouth 2 (two) times daily as needed ("heart flutter")., Disp: , Rfl:  .  Multiple Vitamin (MULTIVITAMIN WITH MINERALS) TABS tablet, Take 1 tablet by mouth daily., Disp:  , Rfl:  .  Psyllium (METAMUCIL PO), Take 1 Dose by mouth at bedtime. Mix with water, Disp: , Rfl:  .  rivaroxaban (XARELTO) 20 MG TABS tablet, Take 1 tablet (20 mg total) by mouth  daily., Disp: 90 tablet, Rfl: 1  Past Medical History: Past Medical History:  Diagnosis Date  . Arthritis   . Bone spur 2010   right shoulder  . CAD (coronary artery disease)    a. 12/2019 NSTEMI/Cath: LM 99ost/m, LAD 40, LCX nl, OM1 80, RCA 70ost/p, 99p, 30d, EF 45-50%; b. 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  . Hyperlipidemia   . Hypertension   . IBS (irritable bowel syndrome)   . Ischemic cardiomyopathy    a. 12/2019 TEE: EF 45-50%. Nl RV fxn. Mild MR.    Tobacco Use: Social History   Tobacco Use  Smoking Status Never Smoker  Smokeless Tobacco Never Used    Labs: Recent Review Flowsheet Data    Labs for ITP Cardiac and Pulmonary Rehab Latest Ref Rng & Units 01/09/2020 01/09/2020 01/10/2020 01/11/2020 02/16/2020   Cholestrol 0 - 200 mg/dL - - - - 188   LDLCALC 0 - 99 mg/dL - - - - 135(H)   LDLDIRECT mg/dL - - - - -   HDL >40 mg/dL - - - - 39(L)   Trlycerides <150 mg/dL - - - - 68   Hemoglobin A1c 4.8 - 5.6 % - - - - -   PHART 7.350 - 7.450 7.455(H) - - - -   PCO2ART 32.0 - 48.0 mmHg 31.3(L) - - - -   HCO3 20.0 - 28.0  mmol/L 21.8 - - - -   TCO2 22 - 32 mmol/L 23 - - - -   ACIDBASEDEF 0.0 - 2.0 mmol/L 1.0 - - - -   O2SAT % 96.0 54.2 61.2 63.5 -       Exercise Target Goals: Exercise Program Goal: Individual exercise prescription set using results from initial 6 min walk test and THRR while considering  patient's activity barriers and safety.   Exercise Prescription Goal: Initial exercise prescription builds to 30-45 minutes a day of aerobic activity, 2-3 days per week.  Home exercise guidelines will be given to patient during program as part of exercise prescription that the participant will acknowledge.   Education: Aerobic Exercise & Resistance Training: - Gives group verbal and written instruction on the various components of exercise. Focuses on aerobic and resistive training programs and the benefits of this training and how to safely progress through  these programs..   Education: Exercise & Equipment Safety: - Individual verbal instruction and demonstration of equipment use and safety with use of the equipment.   Cardiac Rehab from 03/07/2020 in [facility] Cardiac and Pulmonary Rehab  Date  03/07/20  Educator  [facility]  Instruction Review Code  1- Verbalizes Understanding      Education: Exercise Physiology & General Exercise Guidelines: - Group verbal and written instruction with models to review the exercise physiology of the cardiovascular system and associated critical values. Provides general exercise guidelines with specific guidelines to those with heart or lung disease.    Education: Flexibility, Balance, Mind/Body Relaxation: Provides group verbal/written instruction on the benefits of flexibility and balance training, including mind/body exercise modes such as yoga, pilates and tai chi.  Demonstration and skill practice provided.   Activity Barriers & Risk Stratification: Activity Barriers & Cardiac Risk Stratification - 03/07/20 1033      Activity Barriers & Cardiac Risk Stratification   Activity Barriers  Arthritis;Deconditioning;Muscular Weakness;Joint Problems;Incisional Pain    Cardiac Risk Stratification  High       6 Minute Walk: 6 Minute Walk    Row Name 03/07/20 1032         6 Minute Walk   Phase  Initial     Distance  1530 feet     Walk Time  6 minutes     # of Rest Breaks  0     MPH  2.9     METS  3.13     RPE  9     VO2 Peak  10.96     Symptoms  No     Resting HR  54 bpm     Resting BP  124/72     Resting Oxygen Saturation   97 %     Exercise Oxygen Saturation  during 6 min walk  100 %     Max Ex. HR  90 bpm     Max Ex. BP  136/74     2 Minute Post BP  112/56        Oxygen Initial Assessment:   Oxygen Re-Evaluation:   Oxygen Discharge (Final Oxygen Re-Evaluation):   Initial Exercise Prescription: Initial Exercise Prescription - 03/07/20 1000      Date of Initial Exercise RX and Referring  Provider   Date  03/07/20    Referring Provider  Kathlyn Sacramento MD      Treadmill   MPH  2.8    Grade  0    Minutes  15    METs  3.1  Recumbant Bike   Level  2    RPM  50    Watts  22    Minutes  15    METs  3      NuStep   Level  2    SPM  80    Minutes  15    METs  3      REL-XR   Level  2    Speed  50    Minutes  15    METs  3      Prescription Details   Frequency (times per week)  2    Duration  Progress to 30 minutes of continuous aerobic without signs/symptoms of physical distress      Intensity   THRR 40-80% of Max Heartrate  89-124    Ratings of Perceived Exertion  11-13    Perceived Dyspnea  0-4      Progression   Progression  Continue to progress workloads to maintain intensity without signs/symptoms of physical distress.      Resistance Training   Training Prescription  Yes    Weight  3 lb    Reps  10-15       Perform Capillary Blood Glucose checks as needed.  Exercise Prescription Changes: Exercise Prescription Changes    Row Name 03/07/20 1000 03/12/20 1300           Response to Exercise   Blood Pressure (Admit)  124/72  134/58      Blood Pressure (Exercise)  136/74  124/64      Blood Pressure (Exit)  112/56  132/60      Heart Rate (Admit)  54 bpm  66 bpm      Heart Rate (Exercise)  90 bpm  102 bpm      Heart Rate (Exit)  54 bpm  86 bpm      Oxygen Saturation (Admit)  97 %  --      Oxygen Saturation (Exercise)  100 %  --      Rating of Perceived Exertion (Exercise)  9  11      Symptoms  none  none      Comments  walk test results  --      Duration  --  Continue with 30 min of aerobic exercise without signs/symptoms of physical distress.      Intensity  --  THRR unchanged        Progression   Progression  --  Continue to progress workloads to maintain intensity without signs/symptoms of physical distress.      Average METs  --  2.85        Resistance Training   Training Prescription  --  Yes      Weight  --  3 lb       Reps  --  10-15        Interval Training   Interval Training  --  No        Recumbant Bike   Level  --  4      RPM  --  50      Minutes  --  15      METs  --  3.03        NuStep   Level  --  4      SPM  --  80      Minutes  --  15      METs  --  2.7  Exercise Comments:   Exercise Goals and Review: Exercise Goals    Row Name 03/07/20 1057             Exercise Goals   Increase Physical Activity  Yes       Intervention  Provide advice, education, support and counseling about physical activity/exercise needs.;Develop an individualized exercise prescription for aerobic and resistive training based on initial evaluation findings, risk stratification, comorbidities and participant's personal goals.       Expected Outcomes  Long Term: Add in home exercise to make exercise part of routine and to increase amount of physical activity.;Short Term: Attend rehab on a regular basis to increase amount of physical activity.;Long Term: Exercising regularly at least 3-5 days a week.       Increase Strength and Stamina  Yes       Intervention  Provide advice, education, support and counseling about physical activity/exercise needs.;Develop an individualized exercise prescription for aerobic and resistive training based on initial evaluation findings, risk stratification, comorbidities and participant's personal goals.       Expected Outcomes  Short Term: Increase workloads from initial exercise prescription for resistance, speed, and METs.;Short Term: Perform resistance training exercises routinely during rehab and add in resistance training at home;Long Term: Improve cardiorespiratory fitness, muscular endurance and strength as measured by increased METs and functional capacity (6MWT)       Able to understand and use rate of perceived exertion (RPE) scale  Yes       Intervention  Provide education and explanation on how to use RPE scale       Expected Outcomes  Long Term:  Able to use RPE  to guide intensity level when exercising independently;Short Term: Able to use RPE daily in rehab to express subjective intensity level       Able to understand and use Dyspnea scale  Yes       Intervention  Provide education and explanation on how to use Dyspnea scale       Expected Outcomes  Short Term: Able to use Dyspnea scale daily in rehab to express subjective sense of shortness of breath during exertion;Long Term: Able to use Dyspnea scale to guide intensity level when exercising independently       Knowledge and understanding of Target Heart Rate Range (THRR)  Yes       Intervention  Provide education and explanation of THRR including how the numbers were predicted and where they are located for reference       Expected Outcomes  Short Term: Able to state/look up THRR;Short Term: Able to use daily as guideline for intensity in rehab;Long Term: Able to use THRR to govern intensity when exercising independently       Able to check pulse independently  Yes       Intervention  Provide education and demonstration on how to check pulse in carotid and radial arteries.;Review the importance of being able to check your own pulse for safety during independent exercise       Expected Outcomes  Short Term: Able to explain why pulse checking is important during independent exercise;Long Term: Able to check pulse independently and accurately       Understanding of Exercise Prescription  Yes       Intervention  Provide education, explanation, and written materials on patient's individual exercise prescription       Expected Outcomes  Short Term: Able to explain program exercise prescription;Long Term: Able to explain home exercise prescription to exercise  independently          Exercise Goals Re-Evaluation : Exercise Goals Re-Evaluation    [place] Name 03/12/20 1334             Exercise Goal Re-Evaluation   Exercise Goals Review  Increase Physical Activity;Increase Strength and Stamina;Able to  understand and use rate of perceived exertion (RPE) scale;Able to understand and use Dyspnea scale;Knowledge and understanding of Target Heart Rate Range (THRR);Able to check pulse independently;Understanding of Exercise Prescription       Comments  Reviewed RPE scale, THR and program prescription with pt today.  Pt voiced understanding and was given a copy of goals to take home.       Expected Outcomes  Short: Use RPE daily to regulate intensity. Long: Follow program prescription in THR.          Discharge Exercise Prescription (Final Exercise Prescription Changes): Exercise Prescription Changes - 03/12/20 1300      Response to Exercise   Blood Pressure (Admit)  134/58    Blood Pressure (Exercise)  124/64    Blood Pressure (Exit)  132/60    Heart Rate (Admit)  66 bpm    Heart Rate (Exercise)  102 bpm    Heart Rate (Exit)  86 bpm    Rating of Perceived Exertion (Exercise)  11    Symptoms  none    Duration  Continue with 30 min of aerobic exercise without signs/symptoms of physical distress.    Intensity  THRR unchanged      Progression   Progression  Continue to progress workloads to maintain intensity without signs/symptoms of physical distress.    Average METs  2.85      Resistance Training   Training Prescription  Yes    Weight  3 lb    Reps  10-15      Interval Training   Interval Training  No      Recumbant Bike   Level  4    RPM  50    Minutes  15    METs  3.03      NuStep   Level  4    SPM  80    Minutes  15    METs  2.7       Nutrition:  Target Goals: Understanding of nutrition guidelines, daily intake of sodium '1500mg'$ , cholesterol '200mg'$ , calories 30% from fat and 7% or less from saturated fats, daily to have 5 or more servings of fruits and vegetables.  Education: Controlling Sodium/Reading Food Labels -Group verbal and written material supporting the discussion of sodium use in heart healthy nutrition. Review and explanation with models, verbal and  written materials for utilization of the food label.   Education: General Nutrition Guidelines/Fats and Fiber: -Group instruction provided by verbal, written material, models and posters to present the general guidelines for heart healthy nutrition. Gives an explanation and review of dietary fats and fiber.   Biometrics: Pre Biometrics - 03/07/20 1058      Pre Biometrics   Height  5' 6.5" (1.689 m)    Weight  135 lb 3.2 oz (61.3 kg)    BMI (Calculated)  21.5    Single Leg Stand  30 seconds        Nutrition Therapy Plan and Nutrition Goals:   Nutrition Assessments: Nutrition Assessments - 03/07/20 1225      MEDFICTS Scores   Pre Score  36       MEDIFICTS Score Key:          ?  70 Need to make dietary changes          40-70 Heart Healthy Diet         ? 40 Therapeutic Level Cholesterol Diet  Nutrition Goals Re-Evaluation: Nutrition Goals Re-Evaluation    [place] Name 03/28/20 [phone]             Goals   Comment  Pt discharging from program early, unable to do full initial consultation, but answered questions regarding potassium (pt reports his K+ high) and HH eating. Provided handouts for both.          Nutrition Goals Discharge (Final Nutrition Goals Re-Evaluation): Nutrition Goals Re-Evaluation - 03/28/20 8099      Goals   Comment  Pt discharging from program early, unable to do full initial consultation, but answered questions regarding potassium (pt reports his K+ high) and HH eating. Provided handouts for both.       Psychosocial: Target Goals: Acknowledge presence or absence of significant depression and/or stress, maximize coping skills, provide positive support system. Participant is able to verbalize types and ability to use techniques and skills needed for reducing stress and depression.   Education: Depression - Provides group verbal and written instruction on the correlation between heart/lung disease and depressed mood, treatment options, and the stigmas  associated with seeking treatment.   Education: Sleep Hygiene -Provides group verbal and written instruction about how sleep can affect your health.  Define sleep hygiene, discuss sleep cycles and impact of sleep habits. Review good sleep hygiene tips.     Education: Stress and Anxiety: - Provides group verbal and written instruction about the health risks of elevated stress and causes of high stress.  Discuss the correlation between heart/lung disease and anxiety and treatment options. Review healthy ways to manage with stress and anxiety.    Initial Review & Psychosocial Screening: Initial Psych Review & Screening - 03/06/20 1108      Initial Review   Current issues with  Current Stress Concerns    Source of Stress Concerns  Financial;Family;Chronic Illness;Unable to participate in former interests or hobbies;Unable to perform yard/household activities    Comments  Taxes are always stressful, not currently able to get out to garden,  family strain for finances as mother is 85 and running out of [place]?  Yes   wife, brother and sister nearby as well     Barriers   Psychosocial barriers to participate in program  The patient should benefit from training in stress management and relaxation.;Psychosocial barriers identified (see note)      Screening Interventions   Interventions  Encouraged to exercise;To provide support and resources with identified psychosocial needs;Provide feedback about the scores to participant    Expected Outcomes  Short Term goal: Utilizing psychosocial counselor, staff and physician to assist with identification of specific Stressors or current issues interfering with healing process. Setting desired goal for each stressor or current issue identified.;Long Term Goal: Stressors or current issues are controlled or eliminated.;Short Term goal: Identification and review with participant of any Quality of Life or Depression  concerns found by scoring the questionnaire.;Long Term goal: The participant improves quality of Life and PHQ9 Scores as seen by post scores and/or verbalization of changes       Quality of Life Scores:  Quality of Life - 03/07/20 1224      Quality of Life   Select  Quality of Life  Quality of Life Scores   Health/Function Pre  29.57 %    Socioeconomic Pre  30 %    Psych/Spiritual Pre  30 %    Family Pre  30 %    GLOBAL Pre  29.81 %      Scores of 19 and below usually indicate a poorer quality of life in these areas.  A difference of  2-3 points is a clinically meaningful difference.  A difference of 2-3 points in the total score of the Quality of Life Index has been associated with significant improvement in overall quality of life, self-image, physical symptoms, and general health in studies assessing change in quality of life.  PHQ-9: Recent Review Flowsheet Data    Depression screen [facility] 2/9 03/07/2020 06/02/2017 05/25/2016 05/21/2015 05/21/2015   Decreased Interest 0 0 0 0 0   Down, Depressed, Hopeless 0 0 0 0 0   PHQ - 2 Score 0 0 0 0 0   Altered sleeping 0 - - - -   Tired, decreased energy 0 - - - -   Change in appetite 0 - - - -   Feeling bad or failure about yourself  0 - - - -   Trouble concentrating 0 - - - -   Moving slowly or fidgety/restless 0 - - - -   Suicidal thoughts 0 - - - -   PHQ-9 Score 0 - - - -   Difficult doing work/chores Not difficult at all - - - -     Interpretation of Total Score  Total Score Depression Severity:  1-4 = Minimal depression, 5-9 = Mild depression, 10-14 = Moderate depression, 15-19 = Moderately severe depression, 20-27 = Severe depression   Psychosocial Evaluation and Intervention: Psychosocial Evaluation - 03/06/20 1114      Psychosocial Evaluation & Interventions   Interventions  Stress management education;Encouraged to exercise with the program and follow exercise prescription    Comments  Delfino Lovett is coming into cardiac rehab  after a heart attack and by-pass sugery.  He is doing well with his recovery, but eager to get back to his garden.  He feels like he has lost his strength and muscle tone and eager to regain them back to be able to get out into the garden.  His wife and family are big supporters of him.  However, they do have one major family and financial strain.  His mother is 46 in poor health and has dementia. She has four aides that help watch her around the clock but she is running out of money from insurance to cover her care and the family is trying to figure out what to do.  He also has $35 copay for rehab and are a little worried about that as well.  He is going to aim for two days a week to try to help keep cost lower for him.  He lost his glasses in transfer from [facility] to [facility] for surgery and is scheduled for cataract surgery soon so just using old glasses currently.    Expected Outcomes  Short: Attend rehab to rebuild strength  Long: Continue to recover to be able to garden again.    Continue Psychosocial Services   Follow up required by staff       Psychosocial Re-Evaluation:   Psychosocial Discharge (Final Psychosocial Re-Evaluation):   Vocational Rehabilitation: Provide vocational rehab assistance to qualifying candidates.   Vocational Rehab Evaluation & Intervention:   Education: Education Goals: Education classes will be provided  on a variety of topics geared toward better understanding of heart health and risk factor modification. Participant will state understanding/return demonstration of topics presented as noted by education test scores.  Learning Barriers/Preferences: Learning Barriers/Preferences - 03/06/20 1106      Learning Barriers/Preferences   Learning Barriers  Sight;Hearing   HOH, lost glasses with surgery cataract surgery in [place]   Learning Preferences  None       General Cardiac Education Topics:  AED/CPR: - Group verbal and written instruction with the use of models to  demonstrate the basic use of the AED with the basic ABC's of resuscitation.   Anatomy & Physiology of the Heart: - Group verbal and written instruction and models provide basic cardiac anatomy and physiology, with the coronary electrical and arterial systems. Review of Valvular disease and Heart Failure   Cardiac Procedures: - Group verbal and written instruction to review commonly prescribed medications for heart disease. Reviews the medication, class of the drug, and side effects. Includes the steps to properly store meds and maintain the prescription regimen. (beta blockers and nitrates)   Cardiac Medications I: - Group verbal and written instruction to review commonly prescribed medications for heart disease. Reviews the medication, class of the drug, and side effects. Includes the steps to properly store meds and maintain the prescription regimen.   Cardiac Medications II: -Group verbal and written instruction to review commonly prescribed medications for heart disease. Reviews the medication, class of the drug, and side effects. (all other drug classes)    Go Sex-Intimacy & Heart Disease, Get SMART - Goal Setting: - Group verbal and written instruction through game format to discuss heart disease and the return to sexual intimacy. Provides group verbal and written material to discuss and apply goal setting through the application of the S.M.A.R.T. Method.   Other Matters of the Heart: - Provides group verbal, written materials and models to describe Stable Angina and Peripheral Artery. Includes description of the disease process and treatment options available to the cardiac patient.   Infection Prevention: - Provides verbal and written material to individual with discussion of infection control including proper hand washing and proper equipment cleaning during exercise session.   Cardiac Rehab from 03/07/2020 in [facility] Cardiac and Pulmonary Rehab  Date  03/07/20  Educator  [facility]   Instruction Review Code  1- Verbalizes Understanding      Falls Prevention: - Provides verbal and written material to individual with discussion of falls prevention and safety.   Cardiac Rehab from 03/07/2020 in [facility] Cardiac and Pulmonary Rehab  Date  03/07/20  Educator  [facility]  Instruction Review Code  1- Verbalizes Understanding      Other: -Provides group and verbal instruction on various topics (see comments)   Knowledge Questionnaire Score: Knowledge Questionnaire Score - 03/07/20 1225      Knowledge Questionnaire Score   Pre Score  22/26 Education Focus: pulse, MI, PAD, exercise       Core Components/Risk Factors/Patient Goals at Admission: Personal Goals and Risk Factors at Admission - 03/07/20 1226      Core Components/Risk Factors/Patient Goals on Admission    Weight Management  Yes;Weight Maintenance    Intervention  Weight Management: Develop a combined nutrition and exercise program designed to reach desired caloric intake, while maintaining appropriate intake of nutrient and fiber, sodium and fats, and appropriate energy expenditure required for the weight goal.;Weight Management: Provide education and appropriate resources to help participant work on and attain dietary goals.    Admit  Weight  135 lb 3.2 oz (61.3 kg)    Goal Weight: Short Term  135 lb (61.2 kg)    Goal Weight: Long Term  135 lb (61.2 kg)    Expected Outcomes  Short Term: Continue to assess and modify interventions until short term weight is achieved;Long Term: Adherence to nutrition and physical activity/exercise program aimed toward attainment of established weight goal;Weight Maintenance: Understanding of the daily nutrition guidelines, which includes 25-35% calories from fat, 7% or less cal from saturated fats, less than '200mg'$  cholesterol, less than 1.5gm of sodium, & 5 or more servings of fruits and vegetables daily    Hypertension  Yes    Intervention  Provide education on lifestyle modifcations  including regular physical activity/exercise, weight management, moderate sodium restriction and increased consumption of fresh fruit, vegetables, and low fat dairy, alcohol moderation, and smoking cessation.;Monitor prescription use compliance.    Expected Outcomes  Short Term: Continued assessment and intervention until BP is < 140/33m HG in hypertensive participants. < 130/866mHG in hypertensive participants with diabetes, heart failure or chronic kidney disease.;Long Term: Maintenance of blood pressure at goal levels.    Lipids  Yes    Intervention  Provide education and support for participant on nutrition & aerobic/resistive exercise along with prescribed medications to achieve LDL '70mg'$ , HDL >'40mg'$ .    Expected Outcomes  Short Term: Participant states understanding of desired cholesterol values and is compliant with medications prescribed. Participant is following exercise prescription and nutrition guidelines.;Long Term: Cholesterol controlled with medications as prescribed, with individualized exercise RX and with personalized nutrition plan. Value goals: LDL < '70mg'$ , HDL > 40 mg.       Education:Diabetes - Individual verbal and written instruction to review signs/symptoms of diabetes, desired ranges of glucose level fasting, after meals and with exercise. Acknowledge that pre and post exercise glucose checks will be done for 3 sessions at entry of program.   Education: Know Your Numbers and Risk Factors: -Group verbal and written instruction about important numbers in your health.  Discussion of what are risk factors and how they play a role in the disease process.  Review of Cholesterol, Blood Pressure, Diabetes, and BMI and the role they play in your overall health.   Core Components/Risk Factors/Patient Goals Review:    Core Components/Risk Factors/Patient Goals at Discharge (Final Review):    ITP Comments: ITP Comments    Row Name 03/06/20 1123 03/07/20 1032 03/13/20 0646  03/28/20 0920     ITP Comments  Completed virtual orientation today.  EP evaluation is scheduled for Thursday 3/18 at 930.  [facility]  Completed 6MWT and gym orientation.  Initial ITP created and sent for review to Dr. MaEmily FilbertMedical Director.  30 day chart review completed. ITP sent to Dr M Zachery Dakinsedical Director, for review,changes as needed and signature. Continue with ITP if no changes requested  Pt discharging from program early, unable to do full initial RD consultation, but answered questions regarding potassium (pt reports his K+ high) and HH eating. Provided handouts for both.       Comments: Discharged per patient request

## 2020-03-28 NOTE — Progress Notes (Signed)
Discharge Progress Report  Patient Details  Name: [name] MRN: [id number] Date of Birth: Nov 07, 1941 Referring Provider:     Cardiac Rehab from 03/07/2020 in [facility] Cardiac [facility]  Referring Provider  Kathlyn Sacramento MD       Number of Visits: 7/36  Reason for Discharge:  Early Exit:  Personal  Smoking History:  Social History   Tobacco Use  Smoking Status Never Smoker  Smokeless Tobacco Never Used    Diagnosis:  NSTEMI (non-ST elevated myocardial infarction) ([place])  S/P CABG x 4  ADL UCSD:   Initial Exercise Prescription: Initial Exercise Prescription - 03/07/20 1000      Date of Initial Exercise RX and Referring Provider   Date  03/07/20    Referring Provider  Kathlyn Sacramento MD      Treadmill   MPH  2.8    Grade  0    Minutes  15    METs  3.1      Recumbant Bike   Level  2    RPM  50    Watts  22    Minutes  15    METs  3      NuStep   Level  2    SPM  80    Minutes  15    METs  3      REL-XR   Level  2    Speed  50    Minutes  15    METs  3      Prescription Details   Frequency (times per week)  2    Duration  Progress to 30 minutes of continuous aerobic without signs/symptoms of physical distress      Intensity   THRR 40-80% of Max Heartrate  89-124    Ratings of Perceived Exertion  11-13    Perceived Dyspnea  0-4      Progression   Progression  Continue to progress workloads to maintain intensity without signs/symptoms of physical distress.      Resistance Training   Training Prescription  Yes    Weight  3 lb    Reps  10-15       Discharge Exercise Prescription (Final Exercise Prescription Changes): Exercise Prescription Changes - 03/12/20 1300      Response to Exercise   Blood Pressure (Admit)  134/58    Blood Pressure (Exercise)  124/64    Blood Pressure (Exit)  132/60    Heart Rate (Admit)  66 bpm    Heart Rate (Exercise)  102 bpm    Heart Rate (Exit)  86 bpm    Rating of Perceived Exertion (Exercise)   11    Symptoms  none    Duration  Continue with 30 min of aerobic exercise without signs/symptoms of physical distress.    Intensity  THRR unchanged      Progression   Progression  Continue to progress workloads to maintain intensity without signs/symptoms of physical distress.    Average METs  2.85      Resistance Training   Training Prescription  Yes    Weight  3 lb    Reps  10-15      Interval Training   Interval Training  No      Recumbant Bike   Level  4    RPM  50    Minutes  15    METs  3.03      NuStep   Level  4  SPM  80    Minutes  15    METs  2.7       Functional Capacity: 6 Minute Walk    Row Name 03/07/20 1032         6 Minute Walk   Phase  Initial     Distance  1530 feet     Walk Time  6 minutes     # of Rest Breaks  0     MPH  2.9     METS  3.13     RPE  9     VO2 Peak  10.96     Symptoms  No     Resting HR  54 bpm     Resting BP  124/72     Resting Oxygen Saturation   97 %     Exercise Oxygen Saturation  during 6 min walk  100 %     Max Ex. HR  90 bpm     Max Ex. BP  136/74     2 Minute Post BP  112/56        Psychological, QOL, Others - Outcomes: PHQ 2/9: Depression screen [facility] 2/9 03/07/2020 06/02/2017 05/25/2016 05/21/2015 05/21/2015  Decreased Interest 0 0 0 0 0  Down, Depressed, Hopeless 0 0 0 0 0  PHQ - 2 Score 0 0 0 0 0  Altered sleeping 0 - - - -  Tired, decreased energy 0 - - - -  Change in appetite 0 - - - -  Feeling bad or failure about yourself  0 - - - -  Trouble concentrating 0 - - - -  Moving slowly or fidgety/restless 0 - - - -  Suicidal thoughts 0 - - - -  PHQ-9 Score 0 - - - -  Difficult doing work/chores Not difficult at all - - - -    Quality of Life: Quality of Life - 03/07/20 1224      Quality of Life   Select  Quality of Life      Quality of Life Scores   Health/Function Pre  29.57 %    Socioeconomic Pre  30 %    Psych/Spiritual Pre  30 %    Family Pre  30 %    GLOBAL Pre  29.81 %      Nutrition  & Weight - Outcomes: Pre Biometrics - 03/07/20 1058      Pre Biometrics   Height  5' 6.5" (1.689 m)    Weight  135 lb 3.2 oz (61.3 kg)    BMI (Calculated)  21.5    Single Leg Stand  30 seconds      Nutrition: Nutrition Discharge: Nutrition Assessments - 03/07/20 1225      MEDFICTS Scores   Pre Score  36       Education Questionnaire Score: Knowledge Questionnaire Score - 03/07/20 1225      Knowledge Questionnaire Score   Pre Score  22/26 Education Focus: pulse, MI, PAD, exercise

## 2020-04-03 ENCOUNTER — Other Ambulatory Visit: Payer: Self-pay

## 2020-04-03 ENCOUNTER — Ambulatory Visit (INDEPENDENT_AMBULATORY_CARE_PROVIDER_SITE_OTHER): Payer: Self-pay | Admitting: Cardiothoracic Surgery

## 2020-04-03 ENCOUNTER — Encounter: Payer: Self-pay | Admitting: Cardiothoracic Surgery

## 2020-04-03 VITALS — BP 157/68 | HR 52 | Temp 97.9°F | Resp 16 | Ht 66.0 in | Wt 139.2 lb

## 2020-04-03 DIAGNOSIS — Z951 Presence of aortocoronary bypass graft: Secondary | ICD-10-CM

## 2020-04-03 DIAGNOSIS — Z09 Encounter for follow-up examination after completed treatment for conditions other than malignant neoplasm: Secondary | ICD-10-CM

## 2020-04-03 NOTE — Progress Notes (Signed)
PCP is Venia Carbon, MD Referring Provider is Wellington Hampshire, MD  Chief Complaint  Patient presents with  . Routine Post Op    8 wk f/u s/p CABG X 4.Marland KitchenMarland Kitchen1/18/21    HPI: Final postop visit 3 months after emergency CABG x4 after the patient presented with non-STEMI and left main stenosis.  He continues to do well.  He denies angina, CHF, ankle edema, or difficulty with the surgical incisions.  He is finishing up cardiac rehab at [facility].  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  He recently had a left cataract operation.  Patient developed atrial flutter shortly after discharge but was converted to sinus rhythm with medications.  He has been on Xarelto since that time without recurrence.  Today rhythm strip shows sinus rhythm.  He will stop his Xarelto.  He will resume aspirin 81 mg daily.  On his previous visit chest x-ray was clear. Past Medical History:  Diagnosis Date  . Arthritis   . Bone spur 2010   right shoulder  . CAD (coronary artery disease)    a. 12/2019 NSTEMI/Cath: LM 99ost/m, LAD 40, LCX nl, OM1 80, RCA 70ost/p, 99p, 30d, EF 45-50%; b. 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  . Hyperlipidemia   . Hypertension   . IBS (irritable bowel syndrome)   . Ischemic cardiomyopathy    a. 12/2019 TEE: EF 45-50%. Nl RV fxn. Mild MR.    Past Surgical History:  Procedure Laterality Date  . BACK SURGERY     Lumbar  . BUNIONECTOMY    . CORONARY ARTERY BYPASS GRAFT N/A 01/08/2020   Procedure: CORONARY ARTERY BYPASS GRAFTING (CABG) x 4  WITH ENDOSCOPIC HARVESTING OF BILATERAL GREATER SAPHENOUS VEINS;  Surgeon: Ivin Poot, MD;  Location: [place];  Service: Open Heart Surgery;  Laterality: N/A;  . HEMORRHOID SURGERY  2004  . INGUINAL HERNIA REPAIR  02/11  . LEFT HEART CATH AND CORONARY ANGIOGRAPHY N/A 01/08/2020   Procedure: LEFT HEART CATH AND CORONARY ANGIOGRAPHY;  Surgeon: Wellington Hampshire, MD;  Location: [place] [facility];  Service:  Cardiovascular;  Laterality: N/A;  . ROTATOR CUFF REPAIR  07/11   left/ bone spur Dr.Blackman  . SHOULDER ARTHROSCOPY WITH OPEN ROTATOR CUFF REPAIR Right 05/26/2018   Procedure: SHOULDER ARTHROSCOPY WITH OPEN ROTATOR CUFF REPAIR;  Surgeon: Corky Mull, MD;  Location: [facility];  Service: Orthopedics;  Laterality: Right;  debridement, decompression  . SKIN CANCER EXCISION  2003-2005   right shoulder  . TEE WITHOUT CARDIOVERSION N/A 01/08/2020   Procedure: TRANSESOPHAGEAL ECHOCARDIOGRAM (TEE);  Surgeon: Prescott Gum, Collier Salina, MD;  Location: [place];  Service: Open Heart Surgery;  Laterality: N/A;    Family History  Problem Relation Age of Onset  . Heart attack Father   . Heart disease Brother   . Cancer Neg Hx   . Diabetes Neg Hx     Social History Social History   Tobacco Use  . Smoking status: Never Smoker  . Smokeless tobacco: Never Used  Substance Use Topics  . Alcohol use: Not Currently    Alcohol/week: 1.0 standard drinks    Types: 1 Glasses of wine per week    Comment: occassionally  . Drug use: No    Current Outpatient Medications  Medication Sig Dispense Refill  . acetaminophen (TYLENOL) 325 MG tablet Take 325 mg by mouth as needed.    Marland Kitchen aspirin EC 81 MG tablet Take 81 mg by mouth daily.    Marland Kitchen  ezetimibe (ZETIA) 10 MG tablet Take 1 tablet (10 mg total) by mouth daily. 90 tablet 3  . hyoscyamine (ANASPAZ) 0.125 MG TBDP disintergrating tablet Place 0.125 mg under the tongue 2 (two) times daily as needed for bladder spasms.    Marland Kitchen lisinopril (ZESTRIL) 2.5 MG tablet TAKE 1 TABLET(2.5 MG) BY MOUTH DAILY 90 tablet 1  . lovastatin (MEVACOR) 20 MG tablet Take 20 mg by mouth daily.     . metoprolol succinate (TOPROL-XL) 50 MG 24 hr tablet Take 1 tablet (50 mg total) by mouth daily. Take with or immediately following a meal. 30 tablet 3  . metoprolol tartrate (LOPRESSOR) 25 MG tablet Take 12.5 mg by mouth 2 (two) times daily as needed ("heart flutter").    . Multiple Vitamin (MULTIVITAMIN  WITH MINERALS) TABS tablet Take 1 tablet by mouth daily.    . Psyllium (METAMUCIL PO) Take 1 Dose by mouth at bedtime. Mix with water    . rivaroxaban (XARELTO) 20 MG TABS tablet Take 1 tablet (20 mg total) by mouth daily. 90 tablet 1   No current facility-administered medications for this visit.    Allergies  Allergen Reactions  . Crestor [Rosuvastatin] Other (See Comments)    Severe myalgias  . Penicillins Rash  . Peanut-Containing Drug Products   . Atorvastatin Other (See Comments)    ache  . Simvastatin Other (See Comments)    aching    Review of Systems  Weight stable Anxious to do yard work and normal activities again  BP (!) 157/68 (BP Location: Right Arm, Patient Position: Sitting, Cuff Size: Normal)   Pulse (!) 52   Temp 97.9 F (36.6 C)   Resp 16   Ht 5\' 6"  (1.676 m)   Wt 139 lb 3.2 oz (63.1 kg)   SpO2 100%   BMI 22.47 kg/m  Physical Exam       Exam    General- alert and comfortable    Neck- no JVD, no cervical adenopathy palpable, no carotid bruit   Lungs- clear without rales, wheezes   Cor- regular rate and rhythm, no murmur , gallop   Abdomen- soft, non-tender   Extremities - warm, non-tender, minimal edema   Neuro- oriented, appropriate, no focal weakness   Diagnostic Tests: Chest x-ray clear on last visit  Impression: Doing well 3 months after surgery.  His atrial flutter has resolved and he will stop the Xarelto and resume aspirin 81 mg He will continue his other medications until he is seen in the cardiology office at [facility] in [place]. Plan: Patient will return here as needed.  We discussed the importance of a heart healthy lifestyle including heart healthy diet and 30 minutes of walking 5 days a week.   Len Childs, MD [facility] [phone]

## 2020-05-06 ENCOUNTER — Ambulatory Visit (INDEPENDENT_AMBULATORY_CARE_PROVIDER_SITE_OTHER): Payer: Medicare PPO | Admitting: Cardiology

## 2020-05-06 ENCOUNTER — Telehealth: Payer: Self-pay | Admitting: *Deleted

## 2020-05-06 ENCOUNTER — Encounter: Payer: Self-pay | Admitting: Cardiology

## 2020-05-06 ENCOUNTER — Other Ambulatory Visit: Payer: Self-pay

## 2020-05-06 VITALS — BP 144/78 | HR 58 | Ht 66.0 in | Wt 137.0 lb

## 2020-05-06 DIAGNOSIS — E785 Hyperlipidemia, unspecified: Secondary | ICD-10-CM

## 2020-05-06 DIAGNOSIS — I2510 Atherosclerotic heart disease of native coronary artery without angina pectoris: Secondary | ICD-10-CM | POA: Diagnosis not present

## 2020-05-06 DIAGNOSIS — I4892 Unspecified atrial flutter: Secondary | ICD-10-CM | POA: Diagnosis not present

## 2020-05-06 DIAGNOSIS — I5020 Unspecified systolic (congestive) heart failure: Secondary | ICD-10-CM | POA: Diagnosis not present

## 2020-05-06 MED ORDER — REPATHA SURECLICK 140 MG/ML ~~LOC~~ SOAJ: 2.0000 "pen " | SUBCUTANEOUS | 5 refills | Status: DC

## 2020-05-06 NOTE — Progress Notes (Signed)
Cardiology Office Note:    Date:  05/06/2020   ID:  [name], DOB [birth year], MRN [id number]  PCP:  Venia Carbon, MD  Cardiologist:  Kate Sable, MD  Electrophysiologist:  None   Referring MD: Venia Carbon, MD   Chief Complaint  Patient presents with  . office visit    3 month F/U; Meds verbally reviewed with patient.    History of Present Illness:    [name] is a 79 y.o. male with a hx of CAD, status post CABG x4 in January 2021, heart failure reduced ejection fraction, EF 45 to 50%, hypertension, hyperlipidemia a flutter who presents for follow-up.  He was referred to cardiac rehab after last visit.  Cardiac monitor was placed to evaluate presence of atrial flutter.  Patient has a history of postop flutter, currently on Xarelto with easy bruising.  Takes Zetia and lovastatin due to intolerance to Lipitor, Crestor, simvastatin.  Last LDL not at goal..  He otherwise is doing okay today.  Historical notes The patient was originally seen by myself in January 2021 with chest pain/NSTEMI.  Work-up via left heart cath revealed multivessel CAD.  He subsequently underwent CABG x4.  Last admitted a month ago when he developed chest pain and presented to the emergency room.  He was found to be in atrial flutter with RVR.  He was started on Lopressor and Xarelto.  Lopressor was eventually converted to Toprol-XL on discharge.  He has not been able to tolerate Lipitor, simvastatin, or Crestor.  Tolerating Zetia and lovastatin.   Past Medical History:  Diagnosis Date  . Arthritis   . Bone spur 2010   right shoulder  . CAD (coronary artery disease)    a. 12/2019 NSTEMI/Cath: LM 99ost/m, LAD 40, LCX nl, OM1 80, RCA 70ost/p, 99p, 30d, EF 45-50%; b. 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  . Hyperlipidemia   . Hypertension   . IBS (irritable bowel syndrome)   . Ischemic cardiomyopathy    a. 12/2019 TEE: EF 45-50%. Nl RV fxn. Mild MR.    Past Surgical  History:  Procedure Laterality Date  . BACK SURGERY     Lumbar  . BUNIONECTOMY    . CATARACT EXTRACTION, BILATERAL    . CORONARY ARTERY BYPASS GRAFT N/A 01/08/2020   Procedure: CORONARY ARTERY BYPASS GRAFTING (CABG) x 4  WITH ENDOSCOPIC HARVESTING OF BILATERAL GREATER SAPHENOUS VEINS;  Surgeon: Ivin Poot, MD;  Location: [facility];  Service: Open Heart Surgery;  Laterality: N/A;  . HEMORRHOID SURGERY  2004  . INGUINAL HERNIA REPAIR  02/11  . LEFT HEART CATH AND CORONARY ANGIOGRAPHY N/A 01/08/2020   Procedure: LEFT HEART CATH AND CORONARY ANGIOGRAPHY;  Surgeon: Wellington Hampshire, MD;  Location: [place] [facility];  Service: Cardiovascular;  Laterality: N/A;  . ROTATOR CUFF REPAIR  07/11   left/ bone spur Dr.Blackman  . SHOULDER ARTHROSCOPY WITH OPEN ROTATOR CUFF REPAIR Right 05/26/2018   Procedure: SHOULDER ARTHROSCOPY WITH OPEN ROTATOR CUFF REPAIR;  Surgeon: Corky Mull, MD;  Location: [facility];  Service: Orthopedics;  Laterality: Right;  debridement, decompression  . SKIN CANCER EXCISION  2003-2005   right shoulder  . TEE WITHOUT CARDIOVERSION N/A 01/08/2020   Procedure: TRANSESOPHAGEAL ECHOCARDIOGRAM (TEE);  Surgeon: Prescott Gum, Collier Salina, MD;  Location: [facility];  Service: Open Heart Surgery;  Laterality: N/A;    Current Medications: Current Meds  Medication Sig  . acetaminophen (TYLENOL) 325 MG tablet Take 325 mg by mouth as  needed.  Marland Kitchen aspirin EC 81 MG tablet Take 81 mg by mouth daily.  Marland Kitchen ezetimibe (ZETIA) 10 MG tablet Take 1 tablet (10 mg total) by mouth daily.  . hyoscyamine (ANASPAZ) 0.125 MG TBDP disintergrating tablet Place 0.125 mg under the tongue 2 (two) times daily as needed for bladder spasms.  Marland Kitchen ketorolac (ACULAR) 0.5 % ophthalmic solution Place 1 drop into the left eye 4 (four) times daily.  Marland Kitchen lisinopril (ZESTRIL) 2.5 MG tablet TAKE 1 TABLET(2.5 MG) BY MOUTH DAILY  . lovastatin (MEVACOR) 20 MG tablet Take 20 mg by mouth daily.   . metoprolol succinate (TOPROL-XL) 50 MG 24 hr  tablet Take 1 tablet (50 mg total) by mouth daily. Take with or immediately following a meal.  . Multiple Vitamin (MULTIVITAMIN WITH MINERALS) TABS tablet Take 1 tablet by mouth daily.  Marland Kitchen ofloxacin (OCUFLOX) 0.3 % ophthalmic solution Place 1 drop into the right eye 4 (four) times daily.   . prednisoLONE acetate (PRED FORTE) 1 % ophthalmic suspension Place 1 drop into the right eye as directed.   . Psyllium (METAMUCIL PO) Take 1 Dose by mouth at bedtime. Mix with water  . [DISCONTINUED] metoprolol tartrate (LOPRESSOR) 25 MG tablet Take 12.5 mg by mouth 2 (two) times daily as needed ("heart flutter").     Allergies:   Crestor [rosuvastatin], Penicillins, Peanut-containing drug products, Atorvastatin, and Simvastatin   Social History   Socioeconomic History  . Marital status: Single    Spouse name: Not on file  . Number of children: 2  . Years of education: Not on file  . Highest education level: Not on file  Occupational History  . Occupation: retired Merchandiser, retail: retired  Tobacco Use  . Smoking status: Never Smoker  . Smokeless tobacco: Never Used  Substance and Sexual Activity  . Alcohol use: Not Currently    Alcohol/week: 1.0 standard drinks    Types: 1 Glasses of wine per week    Comment: occassionally  . Drug use: No  . Sexual activity: Not on file  Other Topics Concern  . Not on file  Social History Narrative   2 sons, no contact   Splits his time between [place] and [place]   Has live-in since 2005   Has living will   Requests Bernice--girlfriend or brother Herbie [place], as health care POA   Would accept resuscitation but no prolonged artificial ventilation   Wouldn't want tube feeds if cognitively unaware   Social Determinants of Health   Financial Resource Strain:   . Difficulty of Paying Living Expenses:   Food Insecurity:   . Worried About Charity fundraiser in the Last Year:   . Arboriculturist in the Last Year:    Transportation Needs:   . Film/video editor (Medical):   Marland Kitchen Lack of Transportation (Non-Medical):   Physical Activity:   . Days of Exercise per Week:   . Minutes of Exercise per Session:   Stress:   . Feeling of Stress :   Social Connections:   . Frequency of Communication with Friends and Family:   . Frequency of Social Gatherings with Friends and Family:   . Attends Religious Services:   . Active Member of Clubs or Organizations:   . Attends Archivist Meetings:   Marland Kitchen Marital Status:      Family History: The patient's family history includes Heart attack in his father; Heart disease in his brother. There is no  history of Cancer or Diabetes.  ROS:   Please see the history of present illness.     All other systems reviewed and are negative.  EKGs/Labs/Other Studies Reviewed:    The following studies were reviewed today:   EKG:  EKG is  ordered today.  The ekg ordered today demonstrates sinus bradycardia Recent Labs: 01/30/2020: B Natriuretic Peptide 458.0; Magnesium 2.1 01/31/2020: TSH 1.929 02/01/2020: Hemoglobin 10.1; Platelets 322 02/16/2020: ALT 21 02/29/2020: BUN 11; Creatinine, Ser 0.82; Potassium 4.2; Sodium 135  Recent Lipid Panel    Component Value Date/Time   CHOL 188 02/16/2020 0708   TRIG 68 02/16/2020 0708   HDL 39 (L) 02/16/2020 0708   CHOLHDL 4.8 02/16/2020 0708   VLDL 14 02/16/2020 0708   LDLCALC 135 (H) 02/16/2020 0708   LDLDIRECT 188.8 04/05/2012 1254    Physical Exam:    VS:  BP (!) 144/78 (BP Location: Left Arm, Patient Position: Sitting, Cuff Size: Normal)   Pulse (!) 58   Ht 5\' 6"  (1.676 m)   Wt 137 lb (62.1 kg)   SpO2 99%   BMI 22.11 kg/m     Wt Readings from Last 3 Encounters:  05/06/20 137 lb (62.1 kg)  04/03/20 139 lb 3.2 oz (63.1 kg)  03/07/20 135 lb 3.2 oz (61.3 kg)     GEN:  Well nourished, well developed in no acute distress HEENT: Normal NECK: No JVD; No carotid bruits LYMPHATICS: No lymphadenopathy CARDIAC:  RRR, no murmurs, rubs, gallops RESPIRATORY:  Clear to auscultation without rales, wheezing or rhonchi  ABDOMEN: Soft, non-tender, non-distended MUSCULOSKELETAL:  No edema; No deformity  SKIN: Warm and dry NEUROLOGIC:  Alert and oriented x 3 PSYCHIATRIC:  Normal affect   ASSESSMENT:    1. Coronary artery disease involving native coronary artery of native heart without angina pectoris   2. Hyperlipidemia LDL goal <70   3. HFrEF (heart failure with reduced ejection fraction) ([place])   4. Atrial flutter, unspecified type ([place])    PLAN:    In order of problems listed above:  1. Patient with history of CAD status post CABG.  Continue aspirin, lovastatin 2. Patient with history of hyperlipidemia, CAD.  Not tolerant to statins, last LDL not at goal.  Continue Zetia, lovastatin.  Start Repatha.  Repeat fasting lipid panel in 3 months. 3. History of mildly reduced ejection fraction with EF 45 to 50%.  Continue Toprol XL 50 daily, lisinopril 2.5 mg daily. 4. Patient has a history of atrial flutter couple of weeks after CABG.  He endorses easy bruising.  Currently in sinus rhythm, 2-week cardiac monitor did not reveal any evidence for A. fib or flutter.  We will stop Xarelto and monitor patient for now, especially as patient has easy bruising.  Follow-up in 3 months.  Total encounter time 45 minutes  Greater than 50% was spent in counseling and coordination of care with the patient Time spent explaining to patient medication side effects, reasoning for PCSK9 inhibitors, medication management.  Patient had lots of informative questions which were all answered.   Medication Adjustments/Labs and Tests Ordered: Current medicines are reviewed at length with the patient today.  Concerns regarding medicines are outlined above.  Orders Placed This Encounter  Procedures  . Lipid panel  . EKG 12-Lead   Meds ordered this encounter  Medications  . Evolocumab (REPATHA SURECLICK) [id number] MG/ML SOAJ    Sig:  Inject 2 pens into the skin every 14 (fourteen) days.    Dispense:  2 pen  Refill:  5    Patient Instructions  Medication Instructions:  Your physician has recommended you make the following change in your medication:   Dr. Garen Lah recommends  Repatha (PCSK9). This is an injectable cholesterol medication self-administered once every 14 days. This medication will need prior approval with your insurance company, which we will work on. If the medication is not approved initially, we [employer] need to do an appeal with your insurance. We will keep you updated on this process.   Administer medication in area of fatty tissue such as abdomen, outer thigh, back up of arm - and rotate site with each injection Store medication in refrigerator until ready to administer - allow to sit at room temp for 30 mins - 1 hour prior to injection Dispose of medication in a SHARPS container - your pharmacy should be able to direct you on this and proper disposal    *If you need a refill on your cardiac medications before your next appointment, please call your pharmacy*   Lab Work: Your physician recommends that you return for a FASTING lipid profile: 3 months.  Please have your bal drawn at the medical mall 2-3 days prior to your next appointment.  You do not need an appointment. Lab hours are Mon- Fri 7am-6pm  If you have labs (blood work) drawn today and your tests are completely normal, you will receive your results only by: Marland Kitchen MyChart Message (if you have MyChart) OR . A paper copy in the mail If you have any lab test that is abnormal or we need to change your treatment, we will call you to review the results.   Testing/Procedures: None ordered   Follow-Up: At [facility], you and your health needs are our priority.  As part of our continuing mission to provide you with exceptional heart care, we have created designated Provider Care Teams.  These Care Teams include your primary Cardiologist  (physician) and Advanced Practice Providers (APPs -  Physician Assistants and Nurse Practitioners) who all work together to provide you with the care you need, when you need it.  We recommend signing up for the patient portal called "MyChart".  Sign up information is provided on this After Visit Summary.  MyChart is used to connect with patients for Virtual Visits (Telemedicine).  Patients are able to view lab/test results, encounter notes, upcoming appointments, etc.  Non-urgent messages can be sent to your provider as well.   To learn more about what you can do with MyChart, go to [email or web address].    Your next appointment:   3 month(s)  The format for your next appointment:   In Person  Provider:    You [place] see Kate Sable, MD or one of the following Advanced Practice Providers on your designated Care Team:    Murray Hodgkins, NP  Christell Faith, PA-C  Marrianne Mood, PA-C    Other Instructions N/A     Signed, Kate Sable, MD  05/06/2020 10:56 AM    [place]

## 2020-05-06 NOTE — Telephone Encounter (Signed)
Pt requiring PA for Repatha 140 mg/ml. PA has been submitted through covermymeds. Awaiting approval. 

## 2020-05-06 NOTE — Patient Instructions (Signed)
Medication Instructions:  Your physician has recommended you make the following change in your medication:   Dr. Garen Lah recommends  Repatha (PCSK9). This is an injectable cholesterol medication self-administered once every 14 days. This medication will need prior approval with your insurance company, which we will work on. If the medication is not approved initially, we [facility] need to do an appeal with your insurance. We will keep you updated on this process.   Administer medication in area of fatty tissue such as abdomen, outer thigh, back up of arm - and rotate site with each injection Store medication in refrigerator until ready to administer - allow to sit at room temp for 30 mins - 1 hour prior to injection Dispose of medication in a SHARPS container - your pharmacy should be able to direct you on this and proper disposal    *If you need a refill on your cardiac medications before your next appointment, please call your pharmacy*   Lab Work: Your physician recommends that you return for a FASTING lipid profile: 3 months.  Please have your bal drawn at the medical mall 2-3 days prior to your next appointment.  You do not need an appointment. Lab hours are Mon- Fri 7am-6pm  If you have labs (blood work) drawn today and your tests are completely normal, you will receive your results only by: Marland Kitchen MyChart Message (if you have MyChart) OR . A paper copy in the mail If you have any lab test that is abnormal or we need to change your treatment, we will call you to review the results.   Testing/Procedures: None ordered   Follow-Up: At [facility], you and your health needs are our priority.  As part of our continuing mission to provide you with exceptional heart care, we have created designated Provider Care Teams.  These Care Teams include your primary Cardiologist (physician) and Advanced Practice Providers (APPs -  Physician Assistants and Nurse Practitioners) who all work together  to provide you with the care you need, when you need it.  We recommend signing up for the patient portal called "MyChart".  Sign up information is provided on this After Visit Summary.  MyChart is used to connect with patients for Virtual Visits (Telemedicine).  Patients are able to view lab/test results, encounter notes, upcoming appointments, etc.  Non-urgent messages can be sent to your provider as well.   To learn more about what you can do with MyChart, go to [email or web address].    Your next appointment:   3 month(s)  The format for your next appointment:   In Person  Provider:    You [place] see Kate Sable, MD or one of the following Advanced Practice Providers on your designated Care Team:    Murray Hodgkins, NP  Christell Faith, PA-C  Marrianne Mood, PA-C    Other Instructions N/A

## 2020-05-08 ENCOUNTER — Other Ambulatory Visit: Payer: Self-pay

## 2020-05-08 DIAGNOSIS — E785 Hyperlipidemia, unspecified: Secondary | ICD-10-CM

## 2020-05-08 MED ORDER — REPATHA SURECLICK 140 MG/ML ~~LOC~~ SOAJ: 1.0000 "pen " | SUBCUTANEOUS | 5 refills | Status: DC

## 2020-05-08 NOTE — Telephone Encounter (Signed)
Received fax from [place] review in response to PA request for [place]. Answered clinical questions and fax responses back to to the review team with most recent office note and lipid panels results as requested. Waiting for determination.

## 2020-05-09 ENCOUNTER — Ambulatory Visit: Payer: Medicare PPO | Admitting: Cardiology

## 2020-05-09 NOTE — Telephone Encounter (Signed)
[employer] has approved coverage for REPATHA [id number] MG/ML SURECLICK up to 2 pens per 28 days under your Part D benefit for/through 12-20-20.

## 2020-05-10 NOTE — Telephone Encounter (Signed)
Per fax from [place] 140mg /mL has been approved through 12/20/2020. Pharmacy has been informed.

## 2020-06-25 ENCOUNTER — Other Ambulatory Visit: Payer: Self-pay | Admitting: *Deleted

## 2020-06-25 ENCOUNTER — Other Ambulatory Visit: Payer: Self-pay | Admitting: Surgical

## 2020-06-25 MED ORDER — METOPROLOL SUCCINATE ER 50 MG PO TB24: 50.0000 mg | ORAL_TABLET | Freq: Every day | ORAL | 0 refills | Status: DC

## 2020-06-25 NOTE — Telephone Encounter (Signed)
Requested Prescriptions   Signed Prescriptions Disp Refills  . metoprolol succinate (TOPROL-XL) 50 MG 24 hr tablet 30 tablet 0    Sig: Take 1 tablet (50 mg total) by mouth daily. Take with or immediately following a meal.    Authorizing Provider: Kate Sable    Ordering User: Britt Bottom

## 2020-07-01 ENCOUNTER — Other Ambulatory Visit: Payer: Self-pay

## 2020-07-01 MED ORDER — METOPROLOL SUCCINATE ER 50 MG PO TB24: 50.0000 mg | ORAL_TABLET | Freq: Every day | ORAL | 0 refills | Status: DC

## 2020-08-02 ENCOUNTER — Telehealth: Payer: Self-pay | Admitting: Cardiology

## 2020-08-02 NOTE — Telephone Encounter (Signed)
Left voicemail message to call back  

## 2020-08-02 NOTE — Telephone Encounter (Signed)
Spoke with patients caregiver and she wanted to know if patient needs an antibiotic before his upcoming dental surgery. Inquired if he had any valve replacements or mechanical valves and she replied no stating that he had heart attack. Advised that I would send to provider for his review but that based on that information he most likely will not require one. She states dental work to be done in September. Reviewed that we would be in touch once provider replies. She was appreciative for the call back with no further questions at this time.

## 2020-08-02 NOTE — Telephone Encounter (Signed)
Patients companion calling in to ask if patient is needing to be on an antibiotic for an upcoming dental cleaning in September. Patient is trying to move appointment up earlier so they would like an answer as quickly as possible.  Please advise

## 2020-08-08 ENCOUNTER — Encounter: Payer: Self-pay | Admitting: Cardiology

## 2020-08-08 NOTE — Telephone Encounter (Signed)
Spoke with patients caregiver and advised that no antibiotic needed from a heart perspective per Dr. Garen Lah. She was appreciative to hear this and has no further questions at this time.

## 2020-08-09 ENCOUNTER — Other Ambulatory Visit
Admission: RE | Admit: 2020-08-09 | Discharge: 2020-08-09 | Disposition: A | Discharge status: Home / Self Care | Payer: Medicare PPO | Attending: Cardiology | Admitting: Cardiology

## 2020-08-09 ENCOUNTER — Other Ambulatory Visit: Payer: Self-pay

## 2020-08-09 DIAGNOSIS — E785 Hyperlipidemia, unspecified: Secondary | ICD-10-CM | POA: Diagnosis present

## 2020-08-09 LAB — LIPID PANEL
Cholesterol: 120 mg/dL (ref 0–200)
HDL: 44 mg/dL (ref >40)
LDL Cholesterol: 61 mg/dL (ref 0–99)
Total CHOL/HDL Ratio: 2.7 RATIO
Triglycerides: 77 mg/dL (ref <150)
VLDL: 15 mg/dL (ref 0–40)

## 2020-08-12 ENCOUNTER — Other Ambulatory Visit: Payer: Self-pay

## 2020-08-12 ENCOUNTER — Encounter: Payer: Self-pay | Admitting: Cardiology

## 2020-08-12 ENCOUNTER — Ambulatory Visit: Payer: Medicare PPO | Admitting: Cardiology

## 2020-08-12 VITALS — BP 160/64 | HR 56 | Ht 66.0 in | Wt 137.5 lb

## 2020-08-12 DIAGNOSIS — I2510 Atherosclerotic heart disease of native coronary artery without angina pectoris: Secondary | ICD-10-CM

## 2020-08-12 DIAGNOSIS — I4892 Unspecified atrial flutter: Secondary | ICD-10-CM | POA: Diagnosis not present

## 2020-08-12 DIAGNOSIS — I5020 Unspecified systolic (congestive) heart failure: Secondary | ICD-10-CM

## 2020-08-12 DIAGNOSIS — E785 Hyperlipidemia, unspecified: Secondary | ICD-10-CM

## 2020-08-12 NOTE — Patient Instructions (Signed)

## 2020-08-12 NOTE — Progress Notes (Signed)
Cardiology Office Note:    Date:  08/12/2020   ID:  [name], DOB [birth year], MRN [id number]  PCP:  Venia Carbon, MD  Cardiologist:  Kate Sable, MD  Electrophysiologist:  None   Referring MD: Venia Carbon, MD   Chief Complaint  Patient presents with  . office visit    3 month F/U; Meds verbally reviewed with patient.    History of Present Illness:    [name] is a 79 y.o. male with a hx of CAD, status post CABG x4 in January 2021, hyperlipidemia, heart failure reduced ejection fraction, EF 45 to 50%, hypertension, hyperlipidemia a flutter who presents for follow-up. Patient has history of easy bruising on Xarelto. He was taking Xarelto due to postop atrial flutter. He hasn't had atrial flutter since surgery. Xarelto was stopped after last visit. Lipid panel was obtained after last visit. Patient now presents for follow-up of lipid panel results. He was not tolerating Zetia, Repatha was started.  His blood pressure is usually normal at home around 120s to 130s.  When he comes to the office, he is under a lot of stress.  He states having lots of stresses at home with taking care of his mother and also not having money to pay for his groceries.  Historical notes The patient was originally seen by myself in January 2021 with chest pain/NSTEMI.  Work-up via left heart cath revealed multivessel CAD.  He subsequently underwent CABG x4.  Last admitted a month ago when he developed chest pain and presented to the emergency room.  He was found to be in atrial flutter with RVR.  He was started on Lopressor and Xarelto.  Lopressor was eventually converted to Toprol-XL on discharge.  He has not been able to tolerate Lipitor, simvastatin, or Crestor.  Tolerating Zetia and lovastatin.   Past Medical History:  Diagnosis Date  . Arthritis   . Bone spur 2010   right shoulder  . CAD (coronary artery disease)    a. 12/2019 NSTEMI/Cath: LM 99ost/m, LAD 40, LCX nl, OM1 80, RCA  70ost/p, 99p, 30d, EF 45-50%; b. 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  . Hyperlipidemia   . Hypertension   . IBS (irritable bowel syndrome)   . Ischemic cardiomyopathy    a. 12/2019 TEE: EF 45-50%. Nl RV fxn. Mild MR.    Past Surgical History:  Procedure Laterality Date  . BACK SURGERY     Lumbar  . BUNIONECTOMY    . CATARACT EXTRACTION, BILATERAL    . CORONARY ARTERY BYPASS GRAFT N/A 01/08/2020   Procedure: CORONARY ARTERY BYPASS GRAFTING (CABG) x 4  WITH ENDOSCOPIC HARVESTING OF BILATERAL GREATER SAPHENOUS VEINS;  Surgeon: Ivin Poot, MD;  Location: [facility];  Service: Open Heart Surgery;  Laterality: N/A;  . HEMORRHOID SURGERY  2004  . INGUINAL HERNIA REPAIR  02/11  . LEFT HEART CATH AND CORONARY ANGIOGRAPHY N/A 01/08/2020   Procedure: LEFT HEART CATH AND CORONARY ANGIOGRAPHY;  Surgeon: Wellington Hampshire, MD;  Location: [facility];  Service: Cardiovascular;  Laterality: N/A;  . ROTATOR CUFF REPAIR  07/11   left/ bone spur Dr.Blackman  . SHOULDER ARTHROSCOPY WITH OPEN ROTATOR CUFF REPAIR Right 05/26/2018   Procedure: SHOULDER ARTHROSCOPY WITH OPEN ROTATOR CUFF REPAIR;  Surgeon: Corky Mull, MD;  Location: [facility];  Service: Orthopedics;  Laterality: Right;  debridement, decompression  . SKIN CANCER EXCISION  2003-2005   right shoulder  . TEE WITHOUT CARDIOVERSION N/A 01/08/2020  Procedure: TRANSESOPHAGEAL ECHOCARDIOGRAM (TEE);  Surgeon: Prescott Gum, Collier Salina, MD;  Location: [facility];  Service: Open Heart Surgery;  Laterality: N/A;    Current Medications: Current Meds  Medication Sig  . acetaminophen (TYLENOL) 325 MG tablet Take 325 mg by mouth as needed.  Marland Kitchen aspirin EC 81 MG tablet Take 81 mg by mouth daily.  . Evolocumab (REPATHA SURECLICK) 440 MG/ML SOAJ Inject 1 pen into the skin every 14 (fourteen) days.  . hyoscyamine (ANASPAZ) 0.125 MG TBDP disintergrating tablet Place 0.125 mg under the tongue 2 (two) times daily as needed for bladder spasms.  Marland Kitchen  lisinopril (ZESTRIL) 2.5 MG tablet TAKE 1 TABLET(2.5 MG) BY MOUTH DAILY  . lovastatin (MEVACOR) 20 MG tablet Take 20 mg by mouth daily.   . metoprolol succinate (TOPROL-XL) 50 MG 24 hr tablet Take 1 tablet (50 mg total) by mouth daily. Take with or immediately following a meal.  . Multiple Vitamin (MULTIVITAMIN WITH MINERALS) TABS tablet Take 1 tablet by mouth daily.  . Psyllium (METAMUCIL PO) Take 1 Dose by mouth at bedtime. Mix with water     Allergies:   Crestor [rosuvastatin], Penicillins, Peanut-containing drug products, Atorvastatin, and Simvastatin   Social History   Socioeconomic History  . Marital status: Single    Spouse name: Not on file  . Number of children: 2  . Years of education: Not on file  . Highest education level: Not on file  Occupational History  . Occupation: retired Merchandiser, retail: retired  Tobacco Use  . Smoking status: Never Smoker  . Smokeless tobacco: Never Used  Vaping Use  . Vaping Use: Never used  Substance and Sexual Activity  . Alcohol use: Not Currently    Alcohol/week: 1.0 standard drink    Types: 1 Glasses of wine per week    Comment: occassionally  . Drug use: No  . Sexual activity: Not on file  Other Topics Concern  . Not on file  Social History Narrative   2 sons, no contact   Splits his time between [place] and [place]   Has live-in since 2005   Has living will   Requests Bernice--girlfriend or brother Herbie [place], as health care POA   Would accept resuscitation but no prolonged artificial ventilation   Wouldn't want tube feeds if cognitively unaware   Social Determinants of Health   Financial Resource Strain:   . Difficulty of Paying Living Expenses: Not on file  Food Insecurity:   . Worried About Charity fundraiser in the Last Year: Not on file  . Ran Out of Food in the Last Year: Not on file  Transportation Needs:   . Lack of Transportation (Medical): Not on file  . Lack of Transportation  (Non-Medical): Not on file  Physical Activity:   . Days of Exercise per Week: Not on file  . Minutes of Exercise per Session: Not on file  Stress:   . Feeling of Stress : Not on file  Social Connections:   . Frequency of Communication with Friends and Family: Not on file  . Frequency of Social Gatherings with Friends and Family: Not on file  . Attends Religious Services: Not on file  . Active Member of Clubs or Organizations: Not on file  . Attends Archivist Meetings: Not on file  . Marital Status: Not on file     Family History: The patient's family history includes Heart attack in his father; Heart disease in his brother.  There is no history of Cancer or Diabetes.  ROS:   Please see the history of present illness.     All other systems reviewed and are negative.  EKGs/Labs/Other Studies Reviewed:    The following studies were reviewed today:   EKG:  EKG is  ordered today.  The ekg ordered today demonstrates sinus bradycardia Recent Labs: 01/30/2020: B Natriuretic Peptide 458.0; Magnesium 2.1 01/31/2020: TSH 1.929 02/01/2020: Hemoglobin 10.1; Platelets 322 02/16/2020: ALT 21 02/29/2020: BUN 11; Creatinine, Ser 0.82; Potassium 4.2; Sodium 135  Recent Lipid Panel    Component Value Date/Time   CHOL 120 08/09/2020 0733   TRIG 77 08/09/2020 0733   HDL 44 08/09/2020 0733   CHOLHDL 2.7 08/09/2020 0733   VLDL 15 08/09/2020 0733   LDLCALC 61 08/09/2020 0733   LDLDIRECT 188.8 04/05/2012 1254    Physical Exam:    VS:  BP (!) 160/64 (BP Location: Left Arm, Patient Position: Sitting, Cuff Size: Normal)   Pulse (!) 56   Ht 5\' 6"  (1.676 m)   Wt 137 lb 8 oz (62.4 kg)   SpO2 98%   BMI 22.19 kg/m     Wt Readings from Last 3 Encounters:  08/12/20 137 lb 8 oz (62.4 kg)  05/06/20 137 lb (62.1 kg)  04/03/20 139 lb 3.2 oz (63.1 kg)     GEN:  Well nourished, well developed in no acute distress HEENT: Normal NECK: No JVD; No carotid bruits LYMPHATICS: No  lymphadenopathy CARDIAC: RRR, no murmurs, rubs, gallops RESPIRATORY:  Clear to auscultation without rales, wheezing or rhonchi  ABDOMEN: Soft, non-tender, non-distended MUSCULOSKELETAL:  No edema; No deformity  SKIN: Warm and dry NEUROLOGIC:  Alert and oriented x 3 PSYCHIATRIC:  Normal affect   ASSESSMENT:    1. Coronary artery disease involving native coronary artery of native heart without angina pectoris   2. Hyperlipidemia LDL goal <70   3. HFrEF (heart failure with reduced ejection fraction) ([place])   4. Atrial flutter, unspecified type ([place])    PLAN:    In order of problems listed above:  1. Patient with history of CAD status post CABG.  Continue aspirin, lovastatin 2. Patient with history of hyperlipidemia, CAD.  Not tolerant to statins, LDL now at goal of 61. Continue repatha. Ok to hold lovastatin 3. History of mildly reduced ejection fraction with EF 45 to 50%.  Continue Toprol XL 50 daily, lisinopril 2.5 mg daily.  BP usually normal at home.  Follow-up in 6 months  Total encounter time 40 minutes  Greater than 50% was spent in counseling and coordination of care with the patient Spent answering patient's questions which were insightful and numerous.  Medication Adjustments/Labs and Tests Ordered: Current medicines are reviewed at length with the patient today.  Concerns regarding medicines are outlined above.  Orders Placed This Encounter  Procedures  . EKG 12-Lead   No orders of the defined types were placed in this encounter.   Patient Instructions  Medication Instructions:  Your physician recommends that you continue on your current medications as directed. Please refer to the Current Medication list given to you today.  *If you need a refill on your cardiac medications before your next appointment, please call your pharmacy*   Lab Work: None Ordered If you have labs (blood work) drawn today and your tests are completely normal, you will receive your  results only by: Marland Kitchen MyChart Message (if you have MyChart) OR . A paper copy in the mail If you have any lab  test that is abnormal or we need to change your treatment, we will call you to review the results.   Testing/Procedures: None Ordered   Follow-Up: At [facility], you and your health needs are our priority.  As part of our continuing mission to provide you with exceptional heart care, we have created designated Provider Care Teams.  These Care Teams include your primary Cardiologist (physician) and Advanced Practice Providers (APPs -  Physician Assistants and Nurse Practitioners) who all work together to provide you with the care you need, when you need it.  We recommend signing up for the patient portal called "MyChart".  Sign up information is provided on this After Visit Summary.  MyChart is used to connect with patients for Virtual Visits (Telemedicine).  Patients are able to view lab/test results, encounter notes, upcoming appointments, etc.  Non-urgent messages can be sent to your provider as well.   To learn more about what you can do with MyChart, go to [email or web address].    Your next appointment:   6 month(s)  The format for your next appointment:   In Person  Provider:   Kate Sable, MD   Other Instructions      Signed, Kate Sable, MD  08/12/2020 12:20 PM    [place]

## 2020-08-28 ENCOUNTER — Other Ambulatory Visit: Payer: Self-pay | Admitting: Cardiology

## 2020-08-28 MED ORDER — METOPROLOL SUCCINATE ER 50 MG PO TB24: 50.0000 mg | ORAL_TABLET | Freq: Every day | ORAL | 1 refills | Status: DC

## 2020-08-28 NOTE — Telephone Encounter (Signed)
*  STAT* If patient is at the pharmacy, call can be transferred to refill team.   1. Which medications need to be refilled? (please list name of each medication and dose if known)   Metoprolol 50 mg po q d   2. Which pharmacy/location (including street and city if local pharmacy) is medication to be sent to?  [facility] [address]   3. Do they need a 30 day or 90 day supply? [place]

## 2020-08-28 NOTE — Telephone Encounter (Signed)
Refilled metoprolol 50 mg qd #90 with RF:1  to [facility]

## 2020-09-04 ENCOUNTER — Telehealth: Payer: Self-pay | Admitting: Cardiology

## 2020-09-04 NOTE — Telephone Encounter (Signed)
Patient wants a letter for upcoming cruise stating he cannot have alcohol due to a medical condition.    This letter is needed by Monday

## 2020-09-05 ENCOUNTER — Telehealth: Payer: Self-pay | Admitting: Cardiology

## 2020-09-05 NOTE — Telephone Encounter (Signed)
A call was received intitially from the patient's. The original message was taken in her chart to send to Dr. Fletcher Anon, but is the same as below.   She states that they are leaving for [place] this coming Monday and will be there until they leave for a cruise on 09/22/20. The issue she was calling about was that she was needing a letter stating that is was not recommended that the patient and her husband drink alcohol due to a cardiac/ medication condition.   The reason they are needing this is that the cruise line is now requiring all patrons to hold their own alcohol pass. They have a nephew in his 67's that is sharing a room with them. The patient's wife states that they do not drink themselves, but will need some documentation stating they should not drink from a medical perspective so the nephew Betters still obtain an alcohol pass. Without documentation, if not all patrons in the same room hold a pass, then no one in the room [place] be issued one to prevent sharing of passes.   The patient's wife was not sure if 1 letter could be done for the both of them since they are both cardiology patients. She did state that she and the patient just got married on Friday last week, so they will still have different last names when docking.  I advised I would send notification to Dr. Garen Lah for Mr. Hollan and to Dr. Fletcher Anon for her to address individually.  She is aware she should hear a response back from the providers nurse when completed.   She again advised she needs this no later than Monday.

## 2020-09-05 NOTE — Telephone Encounter (Signed)
Letter drafted. Will leave upfront for pick up, per wife.

## 2020-09-05 NOTE — Telephone Encounter (Signed)
Letter drafted, signed, and left at front desk for pick up.

## 2020-12-02 ENCOUNTER — Telehealth: Payer: Self-pay | Admitting: Cardiovascular Disease

## 2020-12-02 NOTE — Telephone Encounter (Signed)
Patients dental office "[facility]" calling in to be advised on whether patient needs to be pre-medicated with an antibody before dental cleaning

## 2020-12-03 NOTE — Telephone Encounter (Signed)
Discussed with Laurann Montana, NP and verified pt will not need to pre-medicate prior to dental cleaning. Returned call to [employer] and notified, spoke with "Sharyn Lull".

## 2021-01-29 ENCOUNTER — Telehealth: Payer: Self-pay | Admitting: Cardiology

## 2021-01-29 DIAGNOSIS — E785 Hyperlipidemia, unspecified: Secondary | ICD-10-CM

## 2021-01-29 NOTE — Telephone Encounter (Signed)
Called patient to schedule follow up  Patient is currently in [place] and will not be back until April  Would like to know if he should still be on Repatha - states it did seem to help Please call spouse to discuss

## 2021-01-30 MED ORDER — REPATHA SURECLICK 140 MG/ML ~~LOC~~ SOAJ: 1.0000 "pen " | SUBCUTANEOUS | 3 refills | Status: DC

## 2021-01-30 NOTE — Telephone Encounter (Signed)
Called and spoke with patients wife and informed her that [name] should remain on the [place]. She requested a refill to [facility] and I sent one in. She was very grateful for the call back.

## 2021-01-30 NOTE — Telephone Encounter (Signed)
Patient wife calling for FYI another rx sent by pcp in [place] ( Dr. Zebedee Iba ) .

## 2021-02-15 IMAGING — DX DG CHEST 1V PORT
1 series · 1 of 1 positions shown · non-contrast
Comparison: None.

CLINICAL DATA: Shortness of breath, hemoptysis

EXAM:
PORTABLE CHEST 1 VIEW

[chest ap]
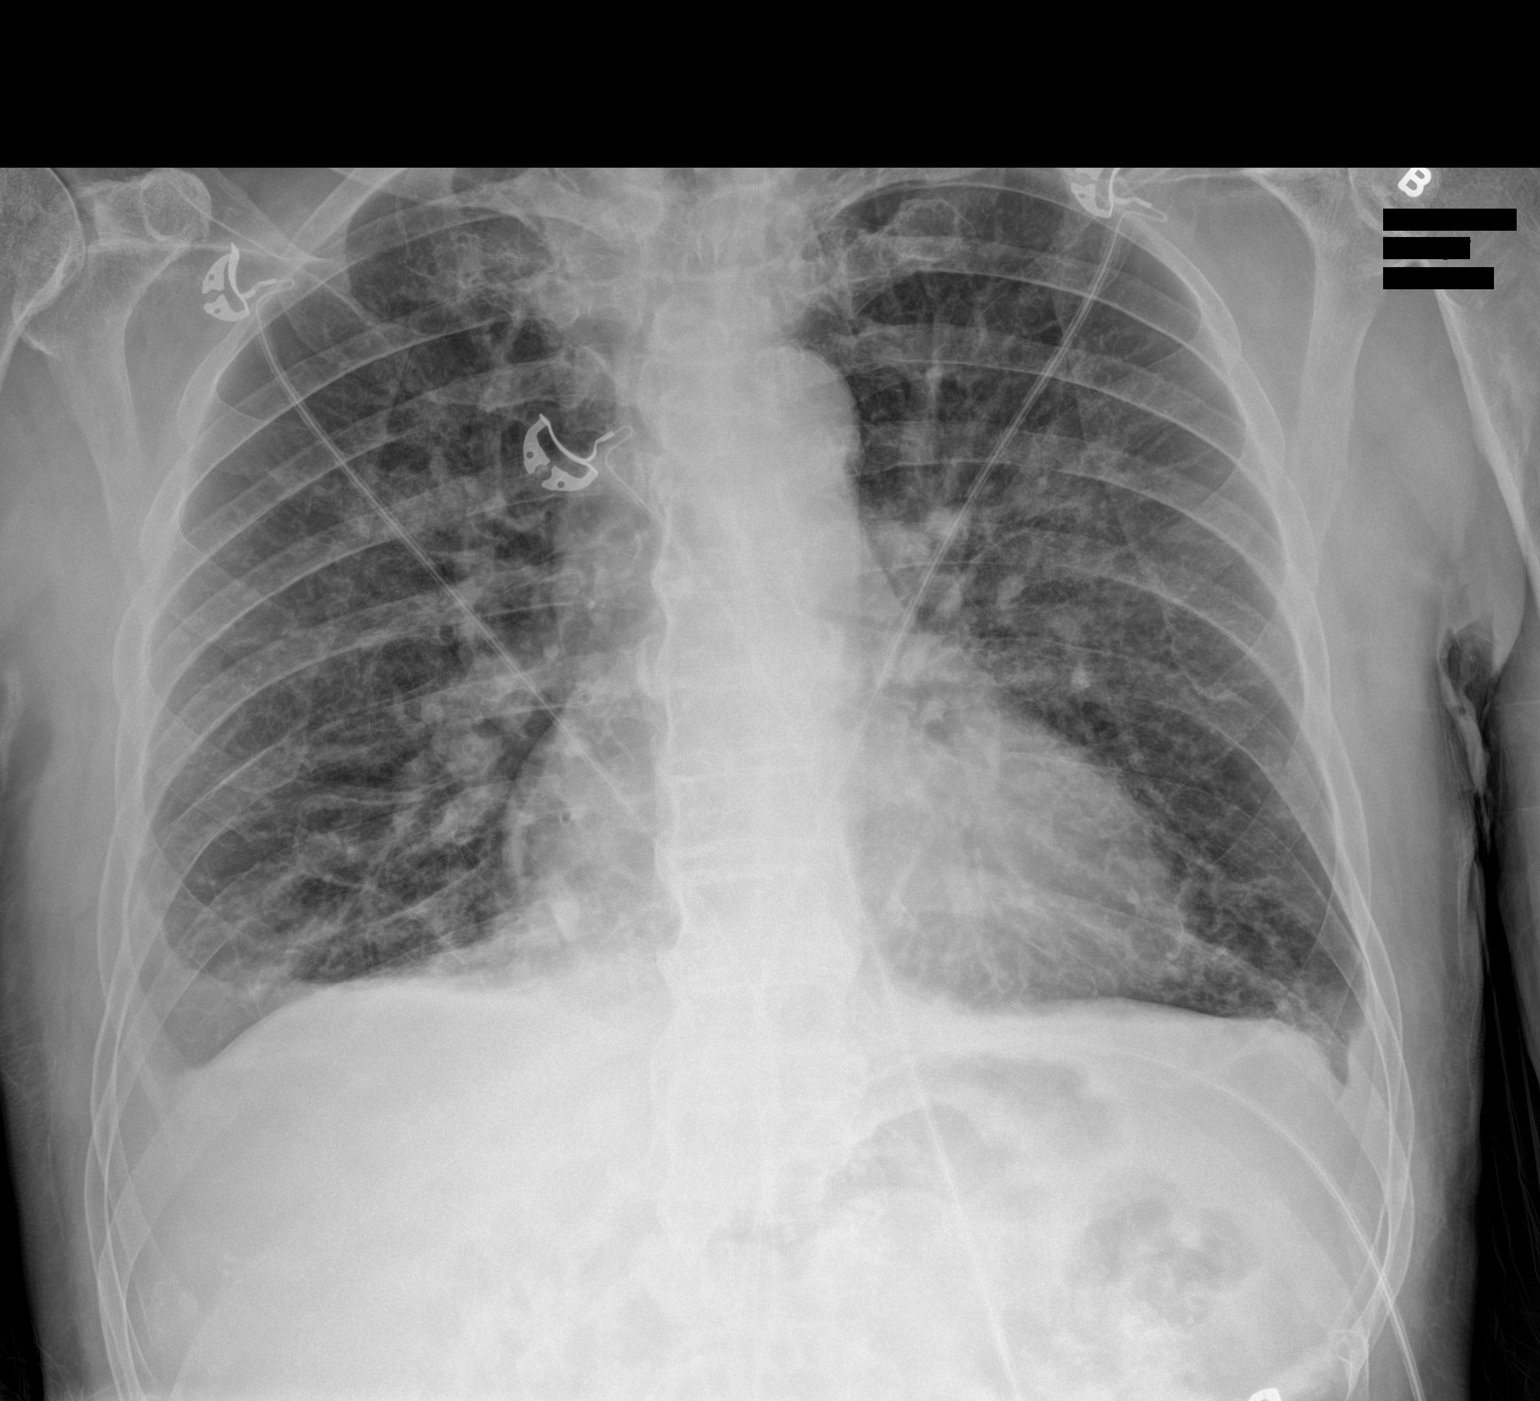

[1 of 1 positions shown; findings below may reference images not displayed]

FINDINGS: The heart size and mediastinal contours are within normal limits.
Prominent interstitial markings throughout both lungs. Small right
pleural effusion. No pneumothorax. Degenerative changes of the
thoracic spine.
IMPRESSION: 1. Prominent interstitial markings throughout both lungs. Findings
may reflect chronic bronchitic type lung changes and/or an
underlying chronic interstitial lung disease.
2. Small right pleural effusion.

## 2021-02-16 IMAGING — DX DG CHEST 1V PORT
1 series · 1 of 1 positions shown · non-contrast
Comparison: Yesterday.

CLINICAL DATA: Status post CABG.

EXAM:
PORTABLE CHEST 1 VIEW

[chest]
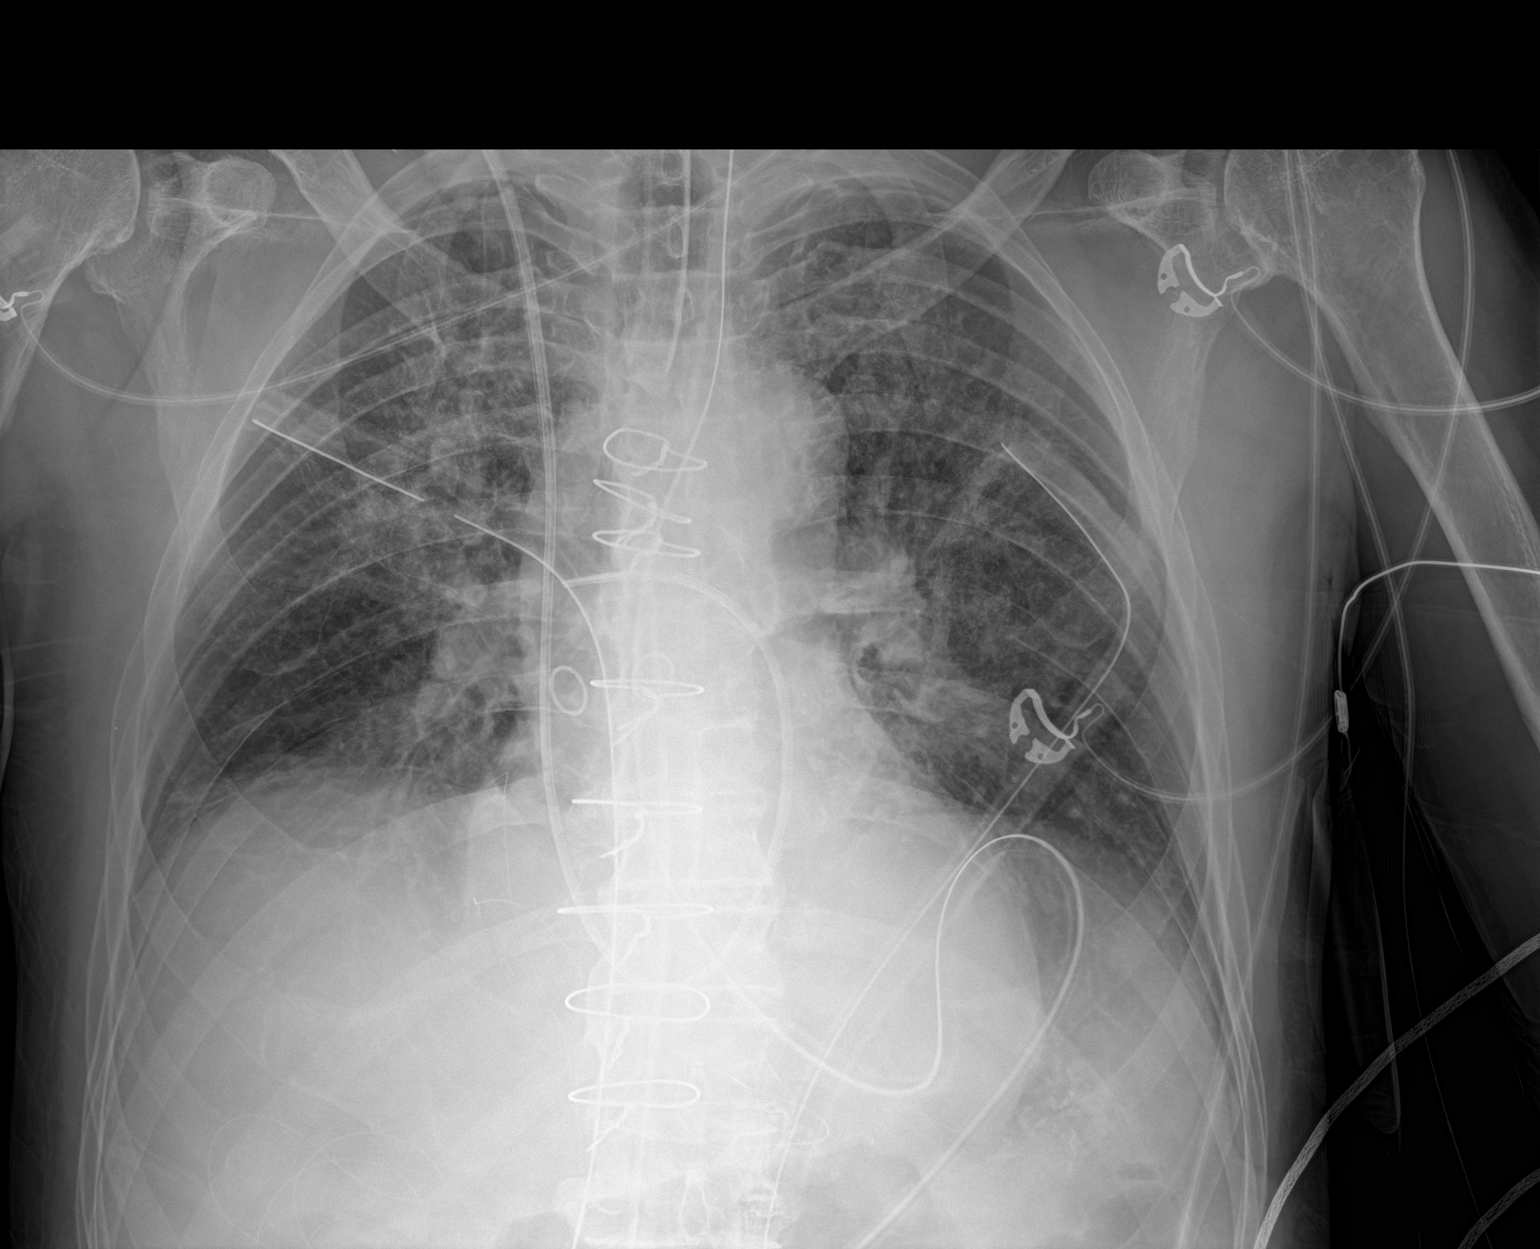

[1 of 1 positions shown; findings below may reference images not displayed]

FINDINGS: Interval post CABG changes with mediastinal and bilateral chest
tubes. Endotracheal tube in satisfactory position. Right jugular
Swan-Ganz catheter tip in distal right main pulmonary artery or
proximal intersegmental pulmonary artery. Nasogastric tube extending
into the distal stomach. Decreased inspiration with no gross change
in diffuse prominence of the interstitial markings. Minimal right
basilar atelectasis. No pneumothorax. Thoracolumbar spine
degenerative changes.
IMPRESSION: 1. Decreased inspiration with minimal right basilar atelectasis.
2. Stable mild chronic interstitial lung disease.
3. Interval CABG.

## 2021-02-17 ENCOUNTER — Other Ambulatory Visit: Payer: Self-pay

## 2021-02-17 IMAGING — DX DG CHEST 1V PORT
1 series · 1 of 1 positions shown · non-contrast
Comparison: One-view chest x-ray 01/08/2020

CLINICAL DATA: CABG yesterday.

EXAM:
PORTABLE CHEST 1 VIEW

[chest]
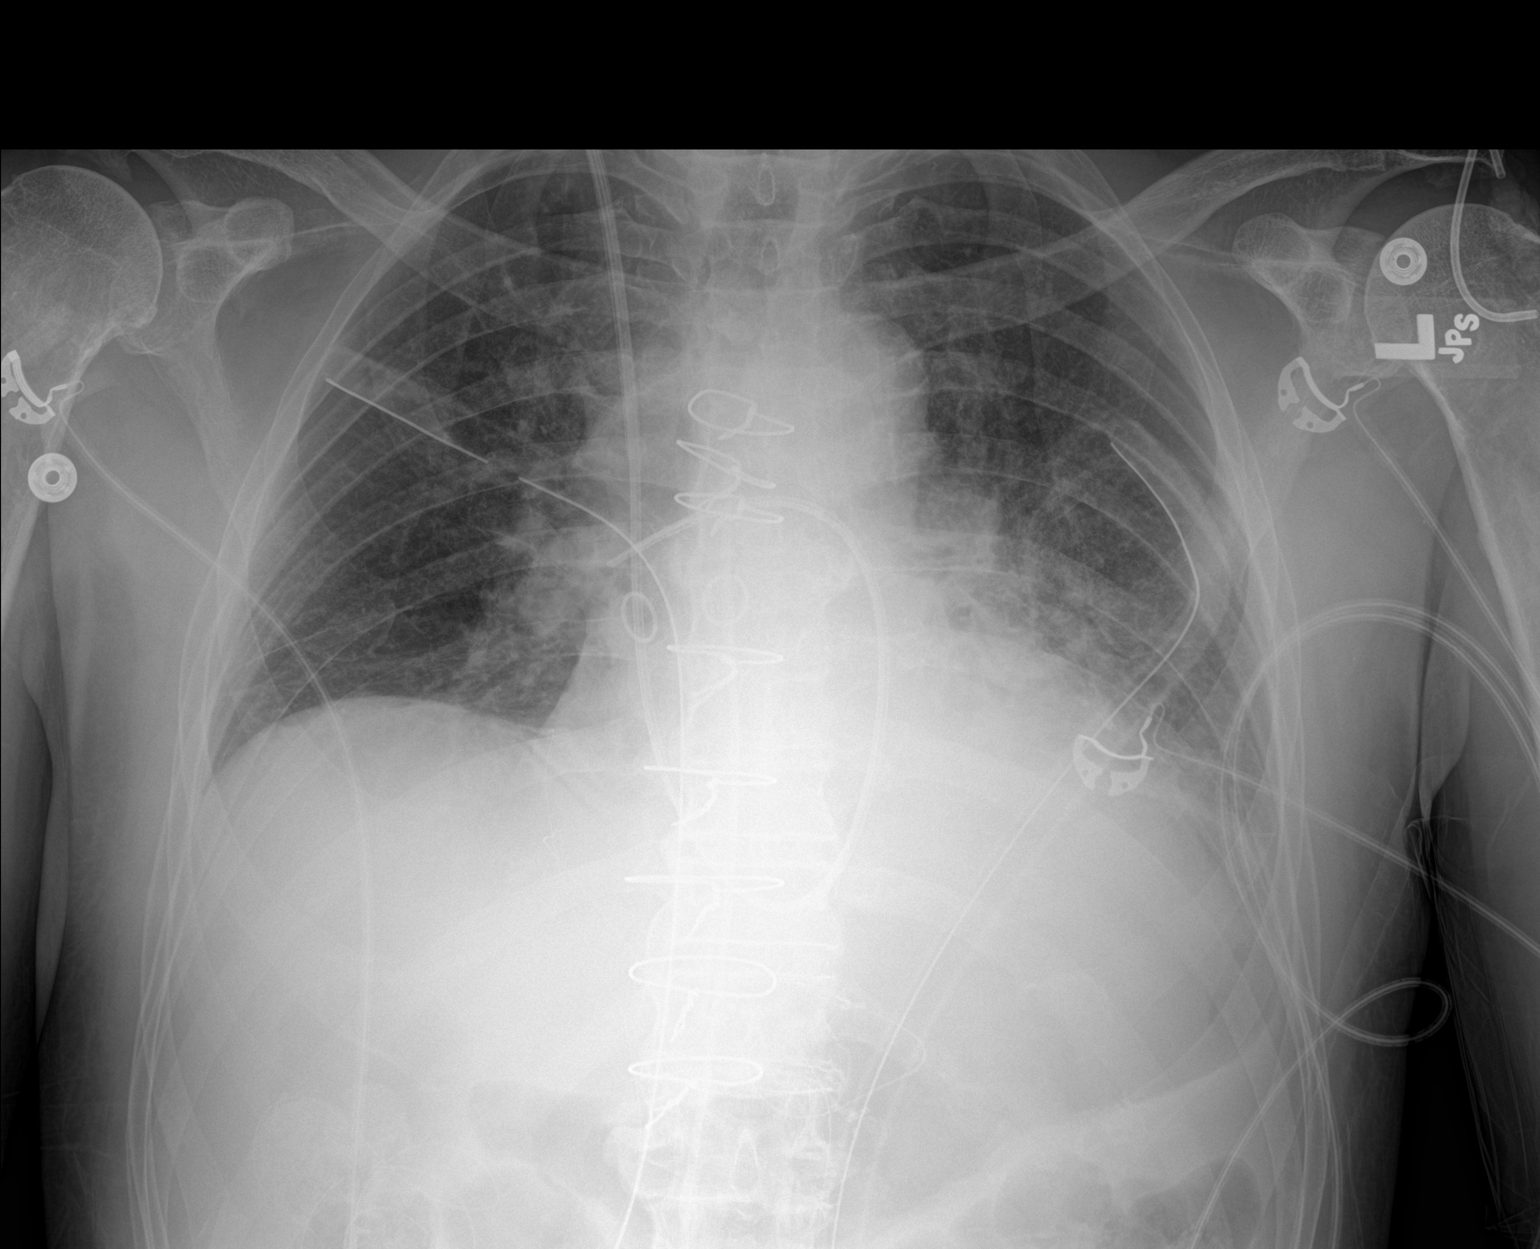

[1 of 1 positions shown; findings below may reference images not displayed]

FINDINGS: The patient is now status post CABG. 8 mediastinal wires are intact.

No endotracheal tube is present. A Swan-Ganz catheter terminates in
the right main pulmonary outflow tract. Bilateral chest tubes and
mediastinal drain are in place. There is no pneumothorax. Lung
volumes are low. Left greater than right pleural effusions and
basilar airspace opacities are noted.
IMPRESSION: 1. Status post CABG without radiographic evidence for complication.
2. Low lung volumes with left greater than right pleural effusions
and basilar airspace disease, likely atelectasis.

## 2021-02-17 MED ORDER — METOPROLOL SUCCINATE ER 50 MG PO TB24: 50.0000 mg | ORAL_TABLET | Freq: Every day | ORAL | 0 refills | Status: DC

## 2021-02-20 IMAGING — DX DG CHEST 2V
2 series · 2 of 2 positions shown · non-contrast
Comparison: 01/11/2020.  CT 01/07/2020.

CLINICAL DATA: CABG.

EXAM:
CHEST - 2 VIEW

[chest pa]
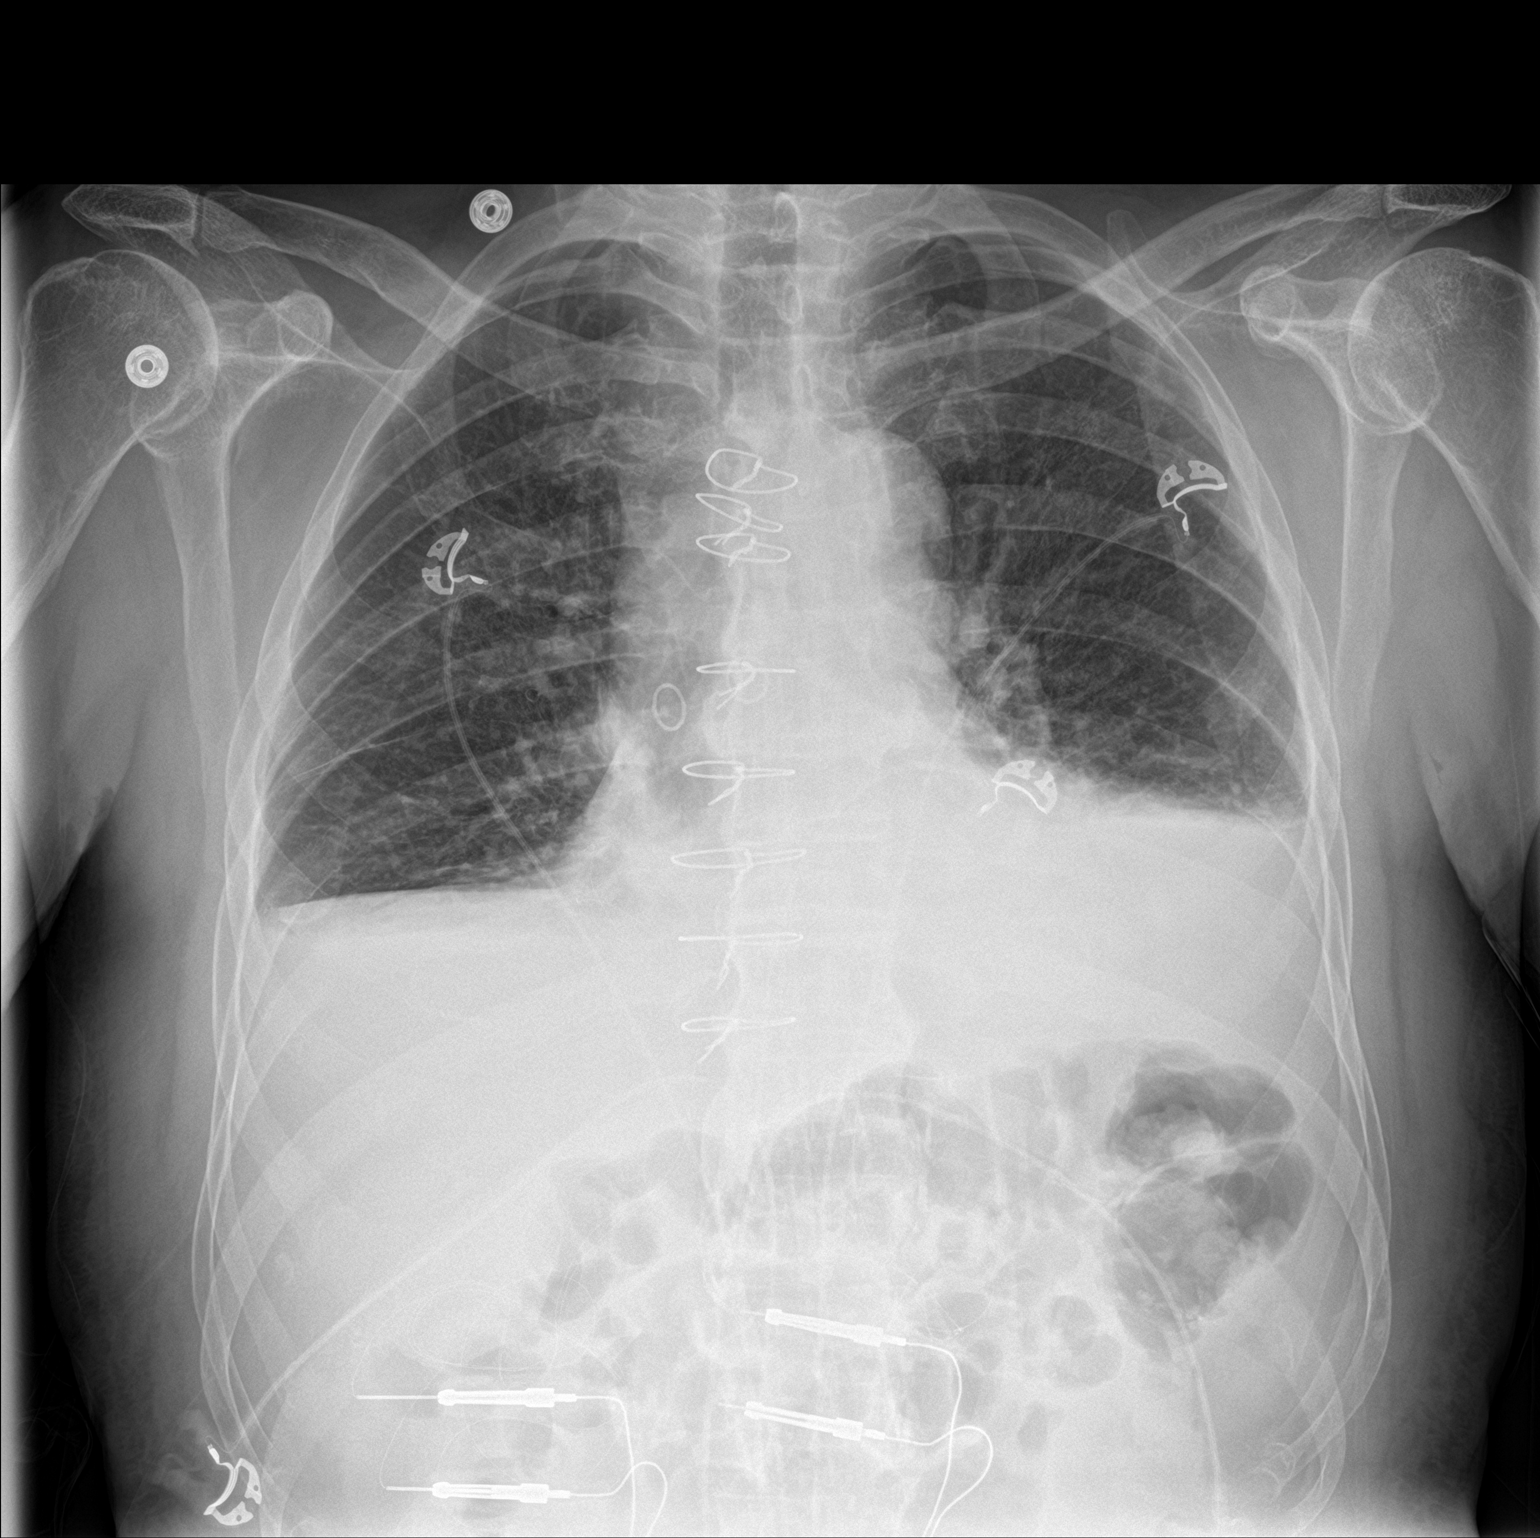

[chest lat]
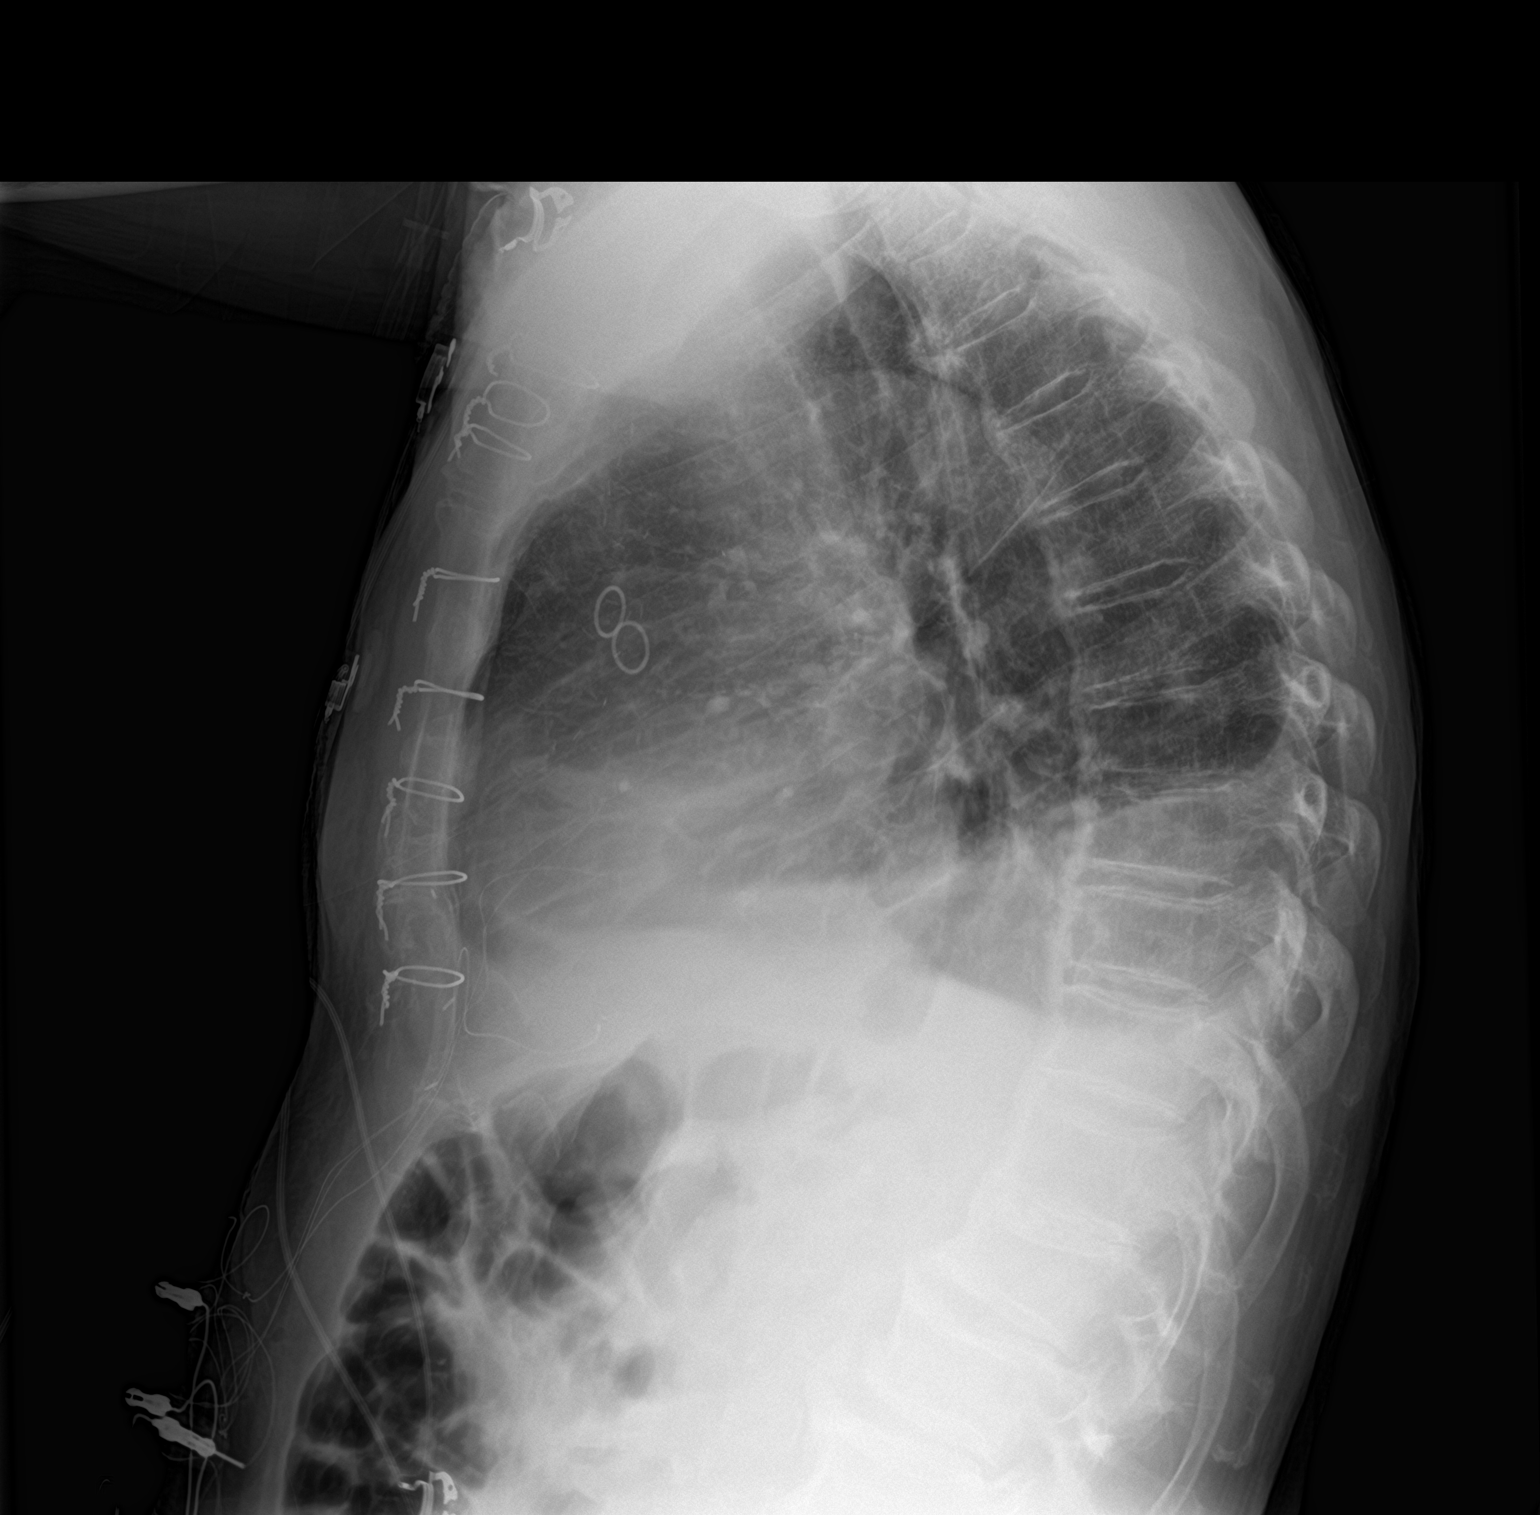

[2 of 2 positions shown; findings below may reference images not displayed]

FINDINGS: Interim removal of right IJ sheath. Prior CABG. Heart size normal.
No pulmonary venous congestion. Persistent but improved bilateral
atelectatic changes/infiltrates. Small bilateral pleural effusions.
No pneumothorax.
IMPRESSION: 1. Interim removal of right IJ line. 2. Prior CABG. Heart size
normal. No pulmonary venous congestion.

3. Persistent but improved bilateral atelectatic
changes/infiltrates. Small bilateral pleural effusions.

## 2021-03-07 ENCOUNTER — Other Ambulatory Visit: Payer: Self-pay | Admitting: *Deleted

## 2021-03-07 MED ORDER — LISINOPRIL 2.5 MG PO TABS: ORAL_TABLET | ORAL | 0 refills | Status: DC

## 2021-03-18 ENCOUNTER — Other Ambulatory Visit: Payer: Self-pay

## 2021-03-18 MED ORDER — EZETIMIBE 10 MG PO TABS: 10.0000 mg | ORAL_TABLET | Freq: Every day | ORAL | 0 refills | Status: DC

## 2021-03-28 ENCOUNTER — Other Ambulatory Visit: Payer: Self-pay | Admitting: Cardiothoracic Surgery

## 2021-03-28 ENCOUNTER — Telehealth: Payer: Self-pay | Admitting: Cardiology

## 2021-03-28 MED ORDER — METOPROLOL SUCCINATE ER 50 MG PO TB24: 50.0000 mg | ORAL_TABLET | Freq: Every day | ORAL | 1 refills | Status: DC

## 2021-03-28 NOTE — Telephone Encounter (Signed)
metoprolol succinate (TOPROL-XL) 50 MG 24 hr tablet [[id number]]   Order Details Dose: 50 mg Route: Oral Frequency: Daily  Dispense Quantity: 90 tablet Refills: 1       Sig: Take 1 tablet (50 mg total) by mouth daily. Take with or immediately following a meal.      Start Date: 03/28/21 End Date: 04/27/21 after 30 doses  Written Date: 03/28/21 Expiration Date: 03/28/22  Original Order:  metoprolol succinate (TOPROL-XL) 50 MG 24 hr tablet [[id number]]   Providers  Ordering and Authorizing Provider:   Kate Sable, [address] [place], [place] [address]  Phone:  [phone]  Fax:  [phone]  DEA #:  [id number]  NPI:  [id number]     Ordering User:  Rodrickus Min, [place] [phone] Lorina Rabon, [place] [facility] [place]  [address] [place], [place] [address]  Phone:  [phone] Fax:  [phone]  DEA #:  [id number]  DAW Reason: --

## 2021-04-10 ENCOUNTER — Encounter: Payer: Self-pay | Admitting: Cardiology

## 2021-04-10 ENCOUNTER — Ambulatory Visit: Payer: Medicare PPO | Admitting: Cardiology

## 2021-04-10 ENCOUNTER — Other Ambulatory Visit: Payer: Self-pay

## 2021-04-10 VITALS — BP 162/62 | HR 44 | Ht 66.0 in | Wt 140.0 lb

## 2021-04-10 DIAGNOSIS — E785 Hyperlipidemia, unspecified: Secondary | ICD-10-CM

## 2021-04-10 DIAGNOSIS — I10 Essential (primary) hypertension: Secondary | ICD-10-CM | POA: Diagnosis not present

## 2021-04-10 DIAGNOSIS — I2510 Atherosclerotic heart disease of native coronary artery without angina pectoris: Secondary | ICD-10-CM | POA: Diagnosis not present

## 2021-04-10 MED ORDER — LISINOPRIL 10 MG PO TABS: 10.0000 mg | ORAL_TABLET | Freq: Every day | ORAL | 3 refills | Status: DC

## 2021-04-10 MED ORDER — METOPROLOL SUCCINATE ER 25 MG PO TB24: 25.0000 mg | ORAL_TABLET | Freq: Every day | ORAL | 3 refills | Status: DC

## 2021-04-10 NOTE — Patient Instructions (Addendum)
  Medication Instructions:   Your physician has recommended you make the following change in your medication:   1.  INCREASE your Lisinopril to 10 MG once a day. 2.  DECREASE your Metoprolol Succinate to 25 MG once a day. 3.  TAKE your REPATHA and your Asprin as prescribed.  *If you need a refill on your cardiac medications before your next appointment, please call your pharmacy*   Lab Work: None ordered If you have labs (blood work) drawn today and your tests are completely normal, you will receive your results only by: Marland Kitchen MyChart Message (if you have MyChart) OR . A paper copy in the mail If you have any lab test that is abnormal or we need to change your treatment, we will call you to review the results.   Testing/Procedures: None ordered   Follow-Up: At [facility], you and your health needs are our priority.  As part of our continuing mission to provide you with exceptional heart care, we have created designated Provider Care Teams.  These Care Teams include your primary Cardiologist (physician) and Advanced Practice Providers (APPs -  Physician Assistants and Nurse Practitioners) who all work together to provide you with the care you need, when you need it.  We recommend signing up for the patient portal called "MyChart".  Sign up information is provided on this After Visit Summary.  MyChart is used to connect with patients for Virtual Visits (Telemedicine).  Patients are able to view lab/test results, encounter notes, upcoming appointments, etc.  Non-urgent messages can be sent to your provider as well.   To learn more about what you can do with MyChart, go to [email or web address].    Your next appointment:   6 month(s)  The format for your next appointment:   In Person  Provider:   Kate Sable, MD   Other Instructions

## 2021-04-10 NOTE — Progress Notes (Signed)
Cardiology Office Note:    Date:  04/10/2021   ID:  [name], DOB [birth year], MRN [id number]  PCP:  Venia Carbon, MD  Cardiologist:  Kate Sable, MD  Electrophysiologist:  None   Referring MD: Venia Carbon, MD   Chief Complaint  Patient presents with  . Other    6 month follow up. Meds reviewed verbally with patient.     History of Present Illness:    [name] is a 80 y.o. male with a hx of CAD, s/p CABG x4 in January 2021, hyperlipidemia, heart failure reduced ejection fraction, EF 45 to 50%, hypertension, hyperlipidemia, postop aflutter who presents for follow-up.   Patient being seen due to history of CAD/CABG, hypertension.  Blood pressure usually normal at home in the 120s to 130s.   after last visit. Lipid panel was obtained after last visit. Patient now presents for follow-up of lipid panel results. He was not tolerating Zetia, Repatha was started.  Blood pressures have been elevated of late with systolic in the 332R.  He does not take aspirin as prescribed.  States his blood pressure cuff has been running very high at home.  Readings this morning was 190 in the office.  Historical notes originally seen in January 2021 with chest pain/NSTEMI.  Work-up via left heart cath revealed multivessel CAD.  He subsequently underwent CABG x4.  Last admitted a month ago when he developed chest pain and presented to the emergency room.  He was found to be in atrial flutter with RVR.  He was started on Lopressor and Xarelto.  Patient has history of easy bruising on Xarelto. He was taking Xarelto due to postop atrial flutter. He hasn't had atrial flutter since surgery. Xarelto was stopped    He has not been able to tolerate Lipitor, simvastatin, or Crestor.  Tolerating Zetia and lovastatin.   Past Medical History:  Diagnosis Date  . Arthritis   . Bone spur 2010   right shoulder  . CAD (coronary artery disease)    a. 12/2019 NSTEMI/Cath: LM 99ost/m, LAD 40, LCX  nl, OM1 80, RCA 70ost/p, 99p, 30d, EF 45-50%; b. 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  . Hyperlipidemia   . Hypertension   . IBS (irritable bowel syndrome)   . Ischemic cardiomyopathy    a. 12/2019 TEE: EF 45-50%. Nl RV fxn. Mild MR.    Past Surgical History:  Procedure Laterality Date  . BACK SURGERY     Lumbar  . BUNIONECTOMY    . CATARACT EXTRACTION, BILATERAL    . CORONARY ARTERY BYPASS GRAFT N/A 01/08/2020   Procedure: CORONARY ARTERY BYPASS GRAFTING (CABG) x 4  WITH ENDOSCOPIC HARVESTING OF BILATERAL GREATER SAPHENOUS VEINS;  Surgeon: Ivin Poot, MD;  Location: [place];  Service: Open Heart Surgery;  Laterality: N/A;  . HEMORRHOID SURGERY  2004  . INGUINAL HERNIA REPAIR  02/11  . LEFT HEART CATH AND CORONARY ANGIOGRAPHY N/A 01/08/2020   Procedure: LEFT HEART CATH AND CORONARY ANGIOGRAPHY;  Surgeon: Wellington [place], MD;  Location: [facility];  Service: Cardiovascular;  Laterality: N/A;  . ROTATOR CUFF REPAIR  07/11   left/ bone spur Dr.Blackman  . SHOULDER ARTHROSCOPY WITH OPEN ROTATOR CUFF REPAIR Right 05/26/2018   Procedure: SHOULDER ARTHROSCOPY WITH OPEN ROTATOR CUFF REPAIR;  Surgeon: Corky Mull, MD;  Location: [facility];  Service: Orthopedics;  Laterality: Right;  debridement, decompression  . SKIN CANCER EXCISION  2003-2005   right shoulder  .  TEE WITHOUT CARDIOVERSION N/A 01/08/2020   Procedure: TRANSESOPHAGEAL ECHOCARDIOGRAM (TEE);  Surgeon: Prescott Gum, Collier Salina, MD;  Location: [place] [facility];  Service: Open Heart Surgery;  Laterality: N/A;    Current Medications: Current Meds  Medication Sig  . acetaminophen (TYLENOL) 325 MG tablet Take 325 mg by mouth as needed.  Marland Kitchen aspirin EC 81 MG tablet Take 81 mg by mouth daily.  . Evolocumab (REPATHA SURECLICK) 469 MG/ML SOAJ Inject 1 pen into the skin every 14 (fourteen) days.  . hyoscyamine (ANASPAZ) 0.125 MG TBDP disintergrating tablet Place 0.125 mg under the tongue 2 (two) times daily as needed for bladder  spasms.  . Multiple Vitamin (MULTIVITAMIN WITH MINERALS) TABS tablet Take 1 tablet by mouth daily.  . Psyllium (METAMUCIL PO) Take 1 Dose by mouth at bedtime. Mix with water  . [DISCONTINUED] lisinopril (ZESTRIL) 2.5 MG tablet TAKE 1 TABLET(2.5 MG) BY MOUTH DAILY. MUST KEEP SCHEDULED APPOINTMENT IN APRIL  . [DISCONTINUED] metoprolol succinate (TOPROL-XL) 50 MG 24 hr tablet Take 1 tablet (50 mg total) by mouth daily. Take with or immediately following a meal.     Allergies:   Crestor [rosuvastatin], Penicillins, Peanut-containing drug products, Atorvastatin, and Simvastatin   Social History   Socioeconomic History  . Marital status: Single    Spouse name: Not on file  . Number of children: 2  . Years of education: Not on file  . Highest education level: Not on file  Occupational History  . Occupation: retired Merchandiser, retail: retired  Tobacco Use  . Smoking status: Never Smoker  . Smokeless tobacco: Never Used  Vaping Use  . Vaping Use: Never used  Substance and Sexual Activity  . Alcohol use: Not Currently    Alcohol/week: 1.0 standard drink    Types: 1 Glasses of wine per week    Comment: occassionally  . Drug use: No  . Sexual activity: Not on file  Other Topics Concern  . Not on file  Social History Narrative   2 sons, no contact   Splits his time between [place] and [place]   Has live-in since 2005   Has living will   Requests Bernice--girlfriend or brother Herbie [place], as health care POA   Would accept resuscitation but no prolonged artificial ventilation   Wouldn't want tube feeds if cognitively unaware   Social Determinants of Health   Financial Resource Strain: Not on file  Food Insecurity: Not on file  Transportation Needs: Not on file  Physical Activity: Not on file  Stress: Not on file  Social Connections: Not on file     Family History: The patient's family history includes Heart attack in his father; Heart disease in his  brother. There is no history of Cancer or Diabetes.  ROS:   Please see the history of present illness.     All other systems reviewed and are negative.  EKGs/Labs/Other Studies Reviewed:    The following studies were reviewed today:   EKG:  EKG is  ordered today.  The ekg ordered today demonstrates sinus bradycardia 44 Recent Labs: No results found for requested labs within last 8760 hours.  Recent Lipid Panel    Component Value Date/Time   CHOL 120 08/09/2020 0733   TRIG 77 08/09/2020 0733   HDL 44 08/09/2020 0733   CHOLHDL 2.7 08/09/2020 0733   VLDL 15 08/09/2020 0733   LDLCALC 61 08/09/2020 0733   LDLDIRECT 188.8 04/05/2012 1254    Physical Exam:  VS:  BP (!) 162/62 (BP Location: Left Arm, Patient Position: Sitting, Cuff Size: Normal)   Pulse (!) 44   Ht 5\' 6"  (1.676 m)   Wt 140 lb (63.5 kg)   BMI 22.60 kg/m     Wt Readings from Last 3 Encounters:  04/10/21 140 lb (63.5 kg)  08/12/20 137 lb 8 oz (62.4 kg)  05/06/20 137 lb (62.1 kg)     GEN:  Well nourished, well developed in no acute distress HEENT: Normal NECK: No JVD; No carotid bruits LYMPHATICS: No lymphadenopathy CARDIAC: RRR, no murmurs, rubs, gallops RESPIRATORY:  Clear to auscultation without rales, wheezing or rhonchi  ABDOMEN: Soft, non-tender, non-distended MUSCULOSKELETAL:  No edema; No deformity  SKIN: Warm and dry NEUROLOGIC:  Alert and oriented x 3 PSYCHIATRIC:  Normal affect   ASSESSMENT:    1. Coronary artery disease involving native coronary artery of native heart without angina pectoris   2. Hyperlipidemia LDL goal <70   3. Primary hypertension    PLAN:    In order of problems listed above:  1. Patient with history of CAD status post CABG. denies chest pain continue aspirin, Repatha.  Heart rate low at 44.  Decrease Toprol-XL to 25 mg daily. 2. Patient with history of hyperlipidemia, CAD.  Not tolerant to statins, LDL now at goal .  Continue Repatha. 3. Hypertension, BP  elevated.  Increase lisinopril to 10 mg daily.  Follow-up in 6 months  Total encounter time 40 minutes  Greater than 50% was spent in counseling and coordination of care with the patient Spent answering patient's questions which were insightful and numerous.  Medication Adjustments/Labs and Tests Ordered: Current medicines are reviewed at length with the patient today.  Concerns regarding medicines are outlined above.  Orders Placed This Encounter  Procedures  . EKG 12-Lead   Meds ordered this encounter  Medications  . lisinopril (ZESTRIL) 10 MG tablet    Sig: Take 1 tablet (10 mg total) by mouth daily.    Dispense:  90 tablet    Refill:  3  . metoprolol succinate (TOPROL-XL) 25 MG 24 hr tablet    Sig: Take 1 tablet (25 mg total) by mouth daily. Take with or immediately following a meal.    Dispense:  90 tablet    Refill:  3    Patient Instructions   Medication Instructions:   Your physician has recommended you make the following change in your medication:   1.  INCREASE your Lisinopril to 10 MG once a day. 2.  DECREASE your Metoprolol Succinate to 25 MG once a day. 3.  TAKE your REPATHA and your Asprin as prescribed.  *If you need a refill on your cardiac medications before your next appointment, please call your pharmacy*   Lab Work: None ordered If you have labs (blood work) drawn today and your tests are completely normal, you will receive your results only by: Marland Kitchen MyChart Message (if you have MyChart) OR . A paper copy in the mail If you have any lab test that is abnormal or we need to change your treatment, we will call you to review the results.   Testing/Procedures: None ordered   Follow-Up: At [facility], you and your health needs are our priority.  As part of our continuing mission to provide you with exceptional heart care, we have created designated Provider Care Teams.  These Care Teams include your primary Cardiologist (physician) and Advanced  Practice Providers (APPs -  Physician Assistants and Nurse Practitioners)  who all work together to provide you with the care you need, when you need it.  We recommend signing up for the patient portal called "MyChart".  Sign up information is provided on this After Visit Summary.  MyChart is used to connect with patients for Virtual Visits (Telemedicine).  Patients are able to view lab/test results, encounter notes, upcoming appointments, etc.  Non-urgent messages can be sent to your provider as well.   To learn more about what you can do with MyChart, go to [email or web address].    Your next appointment:   6 month(s)  The format for your next appointment:   In Person  Provider:   Kate Sable, MD   Other Instructions      Signed, Kate Sable, MD  04/10/2021 2:31 PM    [place]

## 2021-05-05 ENCOUNTER — Other Ambulatory Visit: Payer: Self-pay

## 2021-05-05 DIAGNOSIS — E785 Hyperlipidemia, unspecified: Secondary | ICD-10-CM

## 2021-05-05 MED ORDER — REPATHA SURECLICK 140 MG/ML ~~LOC~~ SOAJ: 1.0000 "pen " | SUBCUTANEOUS | 4 refills | Status: DC

## 2021-07-06 ENCOUNTER — Other Ambulatory Visit: Payer: Self-pay

## 2021-07-06 ENCOUNTER — Emergency Department
Admission: EM | Admit: 2021-07-06 | Discharge: 2021-07-06 | Disposition: A | Discharge status: Home / Self Care | Payer: Medicare PPO | Attending: Emergency Medicine | Admitting: Emergency Medicine

## 2021-07-06 ENCOUNTER — Encounter: Payer: Self-pay | Admitting: Emergency Medicine

## 2021-07-06 DIAGNOSIS — I2510 Atherosclerotic heart disease of native coronary artery without angina pectoris: Secondary | ICD-10-CM | POA: Diagnosis not present

## 2021-07-06 DIAGNOSIS — Z85828 Personal history of other malignant neoplasm of skin: Secondary | ICD-10-CM | POA: Insufficient documentation

## 2021-07-06 DIAGNOSIS — Z7982 Long term (current) use of aspirin: Secondary | ICD-10-CM | POA: Diagnosis not present

## 2021-07-06 DIAGNOSIS — Z951 Presence of aortocoronary bypass graft: Secondary | ICD-10-CM | POA: Insufficient documentation

## 2021-07-06 DIAGNOSIS — Z79899 Other long term (current) drug therapy: Secondary | ICD-10-CM | POA: Insufficient documentation

## 2021-07-06 DIAGNOSIS — K5792 Diverticulitis of intestine, part unspecified, without perforation or abscess without bleeding: Secondary | ICD-10-CM | POA: Insufficient documentation

## 2021-07-06 DIAGNOSIS — I10 Essential (primary) hypertension: Secondary | ICD-10-CM | POA: Diagnosis not present

## 2021-07-06 DIAGNOSIS — R1032 Left lower quadrant pain: Secondary | ICD-10-CM | POA: Diagnosis present

## 2021-07-06 LAB — COMPREHENSIVE METABOLIC PANEL
ALT: 17 U/L (ref 0–44)
AST: 18 U/L (ref 15–41)
Albumin: 4.4 g/dL (ref 3.5–5.0)
Alkaline Phosphatase: 103 U/L (ref 38–126)
Anion gap: 9 (ref 5–15)
BUN: 9 mg/dL (ref 8–23)
CO2: 26 mmol/L (ref 22–32)
Calcium: 9.5 mg/dL (ref 8.9–10.3)
Chloride: 93 mmol/L — ABNORMAL LOW (ref 98–111)
Creatinine, Ser: 0.66 mg/dL (ref 0.61–1.24)
GFR, Estimated: >60 mL/min (ref >60)
Glucose, Bld: 110 mg/dL — ABNORMAL HIGH (ref 70–99)
Potassium: 4.5 mmol/L (ref 3.5–5.1)
Sodium: 128 mmol/L — ABNORMAL LOW (ref 135–145)
Total Bilirubin: 1 mg/dL (ref 0.3–1.2)
Total Protein: 7.6 g/dL (ref 6.5–8.1)

## 2021-07-06 LAB — CBC
HCT: 39.9 % (ref 39.0–52.0)
Hemoglobin: 13.9 g/dL (ref 13.0–17.0)
MCH: 30.2 pg (ref 26.0–34.0)
MCHC: 34.8 g/dL (ref 30.0–36.0)
MCV: 86.6 fL (ref 80.0–100.0)
Platelets: 241 10*3/uL (ref 150–400)
RBC: 4.61 MIL/uL (ref 4.22–5.81)
RDW: 12.6 % (ref 11.5–15.5)
WBC: 8.2 10*3/uL (ref 4.0–10.5)
nRBC: 0 % (ref 0.0–0.2)

## 2021-07-06 LAB — LIPASE, BLOOD: Lipase: 27 U/L (ref 11–51)

## 2021-07-06 MED ORDER — CIPROFLOXACIN HCL 500 MG PO TABS: 500.0000 mg | ORAL_TABLET | Freq: Two times a day (BID) | ORAL | 0 refills | Status: DC

## 2021-07-06 MED ORDER — CIPROFLOXACIN HCL 500 MG PO TABS
500.0000 mg | ORAL_TABLET | Freq: Once | ORAL | Status: AC
  Administered 2021-07-06: 500 mg via ORAL
  Filled 2021-07-06: qty 1

## 2021-07-06 MED ORDER — METRONIDAZOLE 500 MG PO TABS
500.0000 mg | ORAL_TABLET | Freq: Once | ORAL | Status: AC
  Administered 2021-07-06: 500 mg via ORAL
  Filled 2021-07-06: qty 1

## 2021-07-06 MED ORDER — METRONIDAZOLE 500 MG PO TABS: 500.0000 mg | ORAL_TABLET | Freq: Two times a day (BID) | ORAL | 0 refills | Status: DC

## 2021-07-06 NOTE — ED Triage Notes (Signed)
Pt states he has diverticulitis and is hoping to get some antibiotics. Pt reports has has this several times, ate some tomatoes with large seeds and here we are.

## 2021-07-06 NOTE — ED Provider Notes (Signed)
[facility] Emergency Department Provider Note   ____________________________________________    I have reviewed the triage vital signs and the nursing notes.   HISTORY  Chief Complaint Abdominal Pain     HPI [name] is a 80 y.o. male with a history as noted below including a history of multiple episodes of diverticulitis presents with complaints of left lower quadrant pain.  Patient describes this pain as cramping in nature and exactly the same as prior episodes of diverticulitis.  He reports he went to urgent care because typically he is able to get antibiotics early in the course which alleviates his symptoms.  He notes that he wants to go to [place] on Friday and is anxious to get better so is here primarily for antibiotics.  Denies nausea or vomiting.  No fevers  Past Medical History:  Diagnosis Date   Arthritis    Bone spur 2010   right shoulder   CAD (coronary artery disease)    a. 12/2019 NSTEMI/Cath: LM 99ost/m, LAD 40, LCX nl, OM1 80, RCA 70ost/p, 99p, 30d, EF 45-50%; b. 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   Hyperlipidemia    Hypertension    IBS (irritable bowel syndrome)    Ischemic cardiomyopathy    a. 12/2019 TEE: EF 45-50%. Nl RV fxn. Mild MR.    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/08/2020   Bilateral pleural effusion 01/07/2020   Chest pain 01/07/2020   NSTEMI (non-ST elevated myocardial infarction) ([place]) 01/07/2020   Hyponatremia 01/07/2020   Hypertension    Hyperlipidemia    Elevated troponin    Advance directive discussed with patient 05/25/2016   Routine general medical examination at a health care facility 04/05/2012   IRRITABLE BOWEL SYNDROME 07/11/2010   DIVERTICULOSIS-COLON 12/06/2009   HEPATIC  CYST 12/06/2009   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   Hyperlipemia 03/03/2007   Essential hypertension, benign 03/03/2007   OSTEOARTHRITIS 03/03/2007    Past Surgical History:  Procedure Laterality Date   BACK SURGERY     Lumbar   BUNIONECTOMY     CATARACT EXTRACTION, BILATERAL     CORONARY ARTERY BYPASS GRAFT N/A 01/08/2020   Procedure: CORONARY ARTERY BYPASS GRAFTING (CABG) x 4  WITH ENDOSCOPIC HARVESTING OF BILATERAL GREATER SAPHENOUS VEINS;  Surgeon: Ivin Poot, MD;  Location: [place];  Service: Open Heart Surgery;  Laterality: N/A;   HEMORRHOID SURGERY  2004   INGUINAL HERNIA REPAIR  02/11   LEFT HEART CATH AND CORONARY ANGIOGRAPHY N/A 01/08/2020   Procedure: LEFT HEART CATH AND CORONARY ANGIOGRAPHY;  Surgeon: Wellington [place], MD;  Location: [place] [facility];  Service: Cardiovascular;  Laterality: N/A;   ROTATOR CUFF REPAIR  07/11   left/ bone spur Dr.Blackman   SHOULDER ARTHROSCOPY WITH OPEN ROTATOR CUFF REPAIR Right 05/26/2018   Procedure: SHOULDER ARTHROSCOPY WITH OPEN ROTATOR CUFF REPAIR;  Surgeon: Corky Mull, MD;  Location: [facility];  Service: Orthopedics;  Laterality: Right;  debridement, decompression   SKIN CANCER EXCISION  2003-2005   right shoulder   TEE WITHOUT CARDIOVERSION N/A 01/08/2020   Procedure: TRANSESOPHAGEAL ECHOCARDIOGRAM (TEE);  Surgeon: Prescott Gum, Collier Salina, MD;  Location: [place];  Service: Open Heart Surgery;  Laterality: N/A;    Prior to Admission medications  Medication Sig Start Date End Date Taking? Authorizing Provider  ciprofloxacin (CIPRO) 500 MG tablet Take 1 tablet (500 mg total) by mouth 2 (two) times daily. 07/06/21  Yes Lavonia Drafts, MD  metroNIDAZOLE (FLAGYL) 500 MG tablet Take 1 tablet (500 mg total) by mouth 2 (two) times daily after a meal. 07/06/21  Yes Lavonia Drafts, MD  acetaminophen (TYLENOL) 325 MG tablet Take 325 mg by mouth as needed.    [provider]  aspirin EC 81 MG tablet Take 81 mg by mouth daily.    [provider]  Evolocumab (REPATHA SURECLICK) 093 MG/ML SOAJ Inject 1 pen into the skin every 14 (fourteen) days. 05/05/21   Kate Sable, MD  hyoscyamine (ANASPAZ) 0.125 MG TBDP disintergrating tablet Place 0.125 mg under the tongue 2 (two) times daily as needed for bladder spasms.    [provider]  lisinopril (ZESTRIL) 10 MG tablet Take 1 tablet (10 mg total) by mouth daily. 04/10/21   Kate Sable, MD  metoprolol succinate (TOPROL-XL) 25 MG 24 hr tablet Take 1 tablet (25 mg total) by mouth daily. Take with or immediately following a meal. 04/10/21 05/10/21  Kate Sable, MD  Multiple Vitamin (MULTIVITAMIN WITH MINERALS) TABS tablet Take 1 tablet by mouth daily. 01/14/20   Gold, Wayne E, PA-C  Psyllium (METAMUCIL PO) Take 1 Dose by mouth at bedtime. Mix with water    [provider]     Allergies Crestor [rosuvastatin], Penicillins, Peanut-containing drug products, Atorvastatin, and Simvastatin  Family History  Problem Relation Age of Onset   Heart attack Father    Heart disease Brother    Cancer Neg Hx    Diabetes Neg Hx     Social History Social History   Tobacco Use   Smoking status: Never   Smokeless tobacco: Never  Vaping Use   Vaping Use: Never used  Substance Use Topics   Alcohol use: Not Currently    Alcohol/week: 1.0 standard drink    Types: 1 Glasses of wine per week    Comment: occassionally   Drug use: No    Review of Systems  Constitutional: No fever/chills  ENT: No sore throat.   Gastrointestinal: As above.   Genitourinary: Negative for dysuria.  No hematuria Musculoskeletal: Negative for back pain. Skin: Negative for rash. Neurological: Negative for headaches     ____________________________________________   PHYSICAL EXAM:  VITAL SIGNS: ED Triage Vitals  Enc Vitals Group     BP 07/06/21 1401 (!) 185/73     Pulse Rate 07/06/21 1401 (!) 54     Resp 07/06/21 1401 19     Temp 07/06/21 1401 97.7 F (36.5 C)      Temp Source 07/06/21 1401 Oral     SpO2 07/06/21 1401 100 %     Weight 07/06/21 1359 59 kg (130 lb)     Height 07/06/21 1359 1.676 m (5\' 6" )     Head Circumference --      Peak Flow --      Pain Score 07/06/21 1359 8     Pain Loc --      Pain Edu? --      Excl. in [place]? --      Constitutional: Alert and oriented. No acute distress. Pleasant and interactive Eyes: Conjunctivae are normal.  Head: Atraumatic. Nose: No congestion/rhinnorhea. Mouth/Throat: Mucous membranes are moist.   Cardiovascular: Normal rate, regular rhythm.  Respiratory: Normal respiratory effort.  No retractions. Abdomen: Mild left lower quadrant tenderness palpation, no CVA  tenderness Genitourinary: deferred Musculoskeletal: No lower extremity tenderness nor edema.   Neurologic:  Normal speech and language. No gross focal neurologic deficits are appreciated.   Skin:  Skin is warm, dry and intact. No rash noted.   ____________________________________________   LABS (all labs ordered are listed, but only abnormal results are displayed)  Labs Reviewed  COMPREHENSIVE METABOLIC PANEL - Abnormal; Notable for the following components:      Result Value   Sodium 128 (*)    Chloride 93 (*)    Glucose, Bld 110 (*)    All other components within normal limits  LIPASE, BLOOD  CBC  URINALYSIS, COMPLETE (UACMP) WITH MICROSCOPIC   ____________________________________________  EKG   ____________________________________________  RADIOLOGY   ____________________________________________   PROCEDURES  Procedure(s) performed: No  Procedures   Critical Care performed: No ____________________________________________   INITIAL IMPRESSION / ASSESSMENT AND PLAN / ED COURSE  Pertinent labs & imaging results that were available during my care of the patient were reviewed by me and considered in my medical decision making (see chart for details).   Patient presents with left lower quadrant abdominal pain  with a history of diverticulitis.  He reports this feels the same as prior episodes of diverticulitis.  He does not want further evaluation but is simply requesting antibiotics for treatment because he has been through this before.  Lab work is reassuring, normal white blood cell count.  Mild hyponatremia, recommend outpatient follow-up and recheck.  We will start the patient on antibiotics, return precautions discussed   ____________________________________________   FINAL CLINICAL IMPRESSION(S) / ED DIAGNOSES  Final diagnoses:  Diverticulitis      NEW MEDICATIONS STARTED DURING THIS VISIT:  New Prescriptions   CIPROFLOXACIN (CIPRO) 500 MG TABLET    Take 1 tablet (500 mg total) by mouth 2 (two) times daily.   METRONIDAZOLE (FLAGYL) 500 MG TABLET    Take 1 tablet (500 mg total) by mouth 2 (two) times daily after a meal.     Note:  This document was prepared using Dragon voice recognition software and Krach include unintentional dictation errors.    Lavonia Drafts, MD 07/06/21 [phone]

## 2021-07-06 NOTE — ED Triage Notes (Signed)
Pt called from [facility] to treatment room, no response 

## 2021-07-06 NOTE — ED Notes (Signed)
Pt verbalizes understanding of d/c instructions, medications and follow up 

## 2021-07-08 ENCOUNTER — Telehealth: Payer: Self-pay

## 2021-07-08 NOTE — Telephone Encounter (Signed)
Spoke to pt's significant other, Claudia. He is doing much better

## 2021-08-21 ENCOUNTER — Telehealth: Payer: Self-pay | Admitting: *Deleted

## 2021-08-21 NOTE — Telephone Encounter (Signed)
Pt requiring PA for Repatha 140 mg/ml inj. PA has been submitted via Covermymeds. Awaiting Approval.  Key: [id number]  Your information has been submitted to [facility]. [employer] will review the request and will issue a decision, typically within 3-7 days from your submission. You can check the updated outcome later by reopening this request.  If [employer] has not responded in 3-7 days or if you have any questions about your ePA request, please contact [employer] at [phone]. If you think there [place] be a problem with your PA request, use our live chat feature at the bottom right.  For [place] requests, please call [phone].

## 2021-08-27 ENCOUNTER — Encounter: Payer: Self-pay | Admitting: Gastroenterology

## 2021-08-27 ENCOUNTER — Ambulatory Visit: Payer: Medicare PPO | Admitting: Gastroenterology

## 2021-08-27 VITALS — BP 112/63 | HR 64 | Ht 66.0 in | Wt 137.1 lb

## 2021-08-27 DIAGNOSIS — K5732 Diverticulitis of large intestine without perforation or abscess without bleeding: Secondary | ICD-10-CM | POA: Diagnosis not present

## 2021-08-27 MED ORDER — HYOSCYAMINE SULFATE 0.125 MG PO TBDP: 0.1250 mg | ORAL_TABLET | Freq: Two times a day (BID) | ORAL | 3 refills | Status: DC | PRN

## 2021-08-27 NOTE — Progress Notes (Signed)
HPI : [name] is a pleasant 80 year old male with a history of coronary artery disease status post bypass graft surgery who is referred to [place] by Dr. Viviana Simpler for further evaluation of recurrent abdominal pain presumed to be recurrent diverticulitis.  Patient reports a history of recurrent episodes of left lower quadrant pain which radiates to the back.  His most recent episode was in July of this year.  He presented to urgent care and was prescribed ciprofloxacin and Flagyl, which improved his symptoms fairly quickly.   Patient states that this is happened several times, but does not know exactly how many episodes he has had.  In 2010 it is noted he had similar symptoms and underwent an ultrasound to evaluate for pancreatitis.  A CT without contrast did not show evidence of diverticulitis.  He subsequently underwent a colonoscopy which showed sigmoid diverticulosis, otherwise unremarkable. He describes his pain as severe left lower quadrant pain which radiates into the back.  The pain is usually constant and interferes with his daily activities.  He denied any fevers, chills or nausea/vomiting.  He states it has been several years since he had pain like that.  In between these episodes, he will have some mild abdominal pain and gas associated with certain foods.  He has regular bowel movements.  He takes 3 teaspoons of Metamucil nightly as well as a stool softener daily.      Past Medical History:  Diagnosis Date   Arthritis    Bone spur 2010   right shoulder   CAD (coronary artery disease)    a. 12/2019 NSTEMI/Cath: LM 99ost/m, LAD 40, LCX nl, OM1 80, RCA 70ost/p, 99p, 30d, EF 45-50%; b. 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   Hyperlipidemia    Hypertension    IBS (irritable bowel syndrome)    Ischemic cardiomyopathy    a. 12/2019 TEE: EF 45-50%. Nl RV fxn. Mild MR.     Past Surgical History:  Procedure Laterality Date   BACK SURGERY     Lumbar   BUNIONECTOMY      CATARACT EXTRACTION, BILATERAL     COLONOSCOPY     CORONARY ARTERY BYPASS GRAFT N/A 01/08/2020   Procedure: CORONARY ARTERY BYPASS GRAFTING (CABG) x 4  WITH ENDOSCOPIC HARVESTING OF BILATERAL GREATER SAPHENOUS VEINS;  Surgeon: Ivin Poot, MD;  Location: [facility];  Service: Open Heart Surgery;  Laterality: N/A;   HEMORRHOID SURGERY  2004   INGUINAL HERNIA REPAIR  01/2010   LEFT HEART CATH AND CORONARY ANGIOGRAPHY N/A 01/08/2020   Procedure: LEFT HEART CATH AND CORONARY ANGIOGRAPHY;  Surgeon: Wellington Hampshire, MD;  Location: [facility] [facility];  Service: Cardiovascular;  Laterality: N/A;   ROTATOR CUFF REPAIR  06/2010   left/ bone spur Dr.Blackman   SHOULDER ARTHROSCOPY WITH OPEN ROTATOR CUFF REPAIR Right 05/26/2018   Procedure: SHOULDER ARTHROSCOPY WITH OPEN ROTATOR CUFF REPAIR;  Surgeon: Corky Mull, MD;  Location: [facility];  Service: Orthopedics;  Laterality: Right;  debridement, decompression   SKIN CANCER EXCISION  2003-2005   right shoulder   TEE WITHOUT CARDIOVERSION N/A 01/08/2020   Procedure: TRANSESOPHAGEAL ECHOCARDIOGRAM (TEE);  Surgeon: Prescott Gum, Collier Salina, MD;  Location: [place];  Service: Open Heart Surgery;  Laterality: N/A;   Family History  Problem Relation Age of Onset   Heart attack Father    Heart disease Brother    Cancer Neg Hx    Diabetes Neg Hx    Colon cancer Neg Hx  Esophageal cancer Neg Hx    Stomach cancer Neg Hx    Pancreatic cancer Neg Hx    Social History   Tobacco Use   Smoking status: Never   Smokeless tobacco: Never  Vaping Use   Vaping Use: Never used  Substance Use Topics   Alcohol use: Not Currently    Alcohol/week: 1.0 standard drink    Types: 1 Glasses of wine per week    Comment: occassionally   Drug use: No   Current Outpatient Medications  Medication Sig Dispense Refill   acetaminophen (TYLENOL) 325 MG tablet Take 650 mg by mouth as needed.     aspirin EC 81 MG tablet Take 81 mg by mouth daily.     Evolocumab (REPATHA  SURECLICK) XX123456 MG/ML SOAJ Inject 1 pen into the skin every 14 (fourteen) days. 2 mL 4   hyoscyamine (ANASPAZ) 0.125 MG TBDP disintergrating tablet Place 0.125 mg under the tongue 2 (two) times daily as needed for bladder spasms.     lisinopril (ZESTRIL) 10 MG tablet Take 1 tablet (10 mg total) by mouth daily. 90 tablet 3   metoprolol succinate (TOPROL-XL) 25 MG 24 hr tablet Take 1 tablet (25 mg total) by mouth daily. Take with or immediately following a meal. 90 tablet 3   Psyllium (METAMUCIL PO) Take 1 Dose by mouth at bedtime. Mix with water     No current facility-administered medications for this visit.   Allergies  Allergen Reactions   Crestor [Rosuvastatin] Other (See Comments)    Severe myalgias   Penicillins Rash   Peanut-Containing Drug Products    Atorvastatin Other (See Comments)    ache   Simvastatin Other (See Comments)    aching     Review of Systems: All systems reviewed and negative except where noted in HPI.    No results found.  Physical Exam: BP 112/63 (BP Location: Right Arm, Patient Position: Sitting, Cuff Size: Normal)   Pulse 64   Ht '5\' 6"'$  (1.676 m)   Wt 137 lb 2 oz (62.2 kg)   SpO2 97%   BMI 22.13 kg/m  Constitutional: Pleasant,well-developed, Caucasian male in no acute distress.  Accompanied by wife HEENT: Normocephalic and atraumatic. Conjunctivae are normal. No scleral icterus.  Cardiovascular: Normal rate, regular rhythm.  Pulmonary/chest: Effort normal and breath sounds normal. No wheezing, rales or rhonchi. Abdominal: Soft, nondistended, mild tenderness to palpation in the left lower quadrant, no rigidity or guarding. Bowel sounds active throughout. There are no masses palpable. No hepatomegaly. Extremities: no edema Neurological: Alert and oriented to person place and time. Skin: Skin is warm and dry. No rashes noted. Psychiatric: Normal mood and affect. Behavior is normal.  CBC    Component Value Date/Time   WBC 8.2 07/06/2021 1401    RBC 4.61 07/06/2021 1401   HGB 13.9 07/06/2021 1401   HGB 10.8 (L) 01/25/2020 1221   HCT 39.9 07/06/2021 1401   HCT 33.7 (L) 01/25/2020 1221   PLT 241 07/06/2021 1401   PLT 591 (H) 01/25/2020 1221   MCV 86.6 07/06/2021 1401   MCV 91 01/25/2020 1221   MCH 30.2 07/06/2021 1401   MCHC 34.8 07/06/2021 1401   RDW 12.6 07/06/2021 1401   RDW 12.7 01/25/2020 1221   LYMPHSABS 2.4 01/30/2020 1942   MONOABS 0.8 01/30/2020 1942   EOSABS 0.3 01/30/2020 1942   BASOSABS 0.0 01/30/2020 1942    CMP     Component Value Date/Time   NA 128 (L) 07/06/2021 1401   NA  137 01/25/2020 1221   K 4.5 07/06/2021 1401   CL 93 (L) 07/06/2021 1401   CO2 26 07/06/2021 1401   GLUCOSE 110 (H) 07/06/2021 1401   BUN 9 07/06/2021 1401   BUN 9 01/25/2020 1221   CREATININE 0.66 07/06/2021 1401   CALCIUM 9.5 07/06/2021 1401   PROT 7.6 07/06/2021 1401   ALBUMIN 4.4 07/06/2021 1401   AST 18 07/06/2021 1401   ALT 17 07/06/2021 1401   ALKPHOS 103 07/06/2021 1401   BILITOT 1.0 07/06/2021 1401   GFRNONAA >60 07/06/2021 1401   GFRAA >60 02/29/2020 0714    Colonoscopy 2010:  Sigmoid diverticulosis, otherwise normal.  ASSESSMENT AND PLAN: 80 year old male with reported history of recurrent diverticulitis.  His symptoms do seem consistent with diverticulitis, however we have no CT imaging in the record to confirm he ever had an episode of diverticulitis.  I had a prolonged discussion with the patient and his wife regarding diverticulosis, diverticulitis as well as irritable bowel syndrome.  We discussed the lack of evidence supporting previous recommendations to avoid certain foods such as seeds, nuts and popcorn.  We discussed the lack of data for any intervention to reduce recurrence of diverticulitis, other than what he is already doing which is fiber supplementation.  I suggested that if he has another episode of abdominal pain, we should get a CT scan to confirm a diagnosis of diverticulitis.  Patient was given  handouts providing education on diverticulosis and diverticulitis.  We will refill his hyoscyamine which he says helps with the pain when it occurs. The patient had a normal colonoscopy in 2010 and is now 43.  Further colon cancer screening past age 75 is case-by-case decision.  I did not strongly recommend the patient undergo further screening colonoscopies, the patient was was agreeable with this.  Recurrent abdominal pain, suspected diverticulitis - CT scan with next episode to confirm suspected diagnosis - Continue daily fiber supplementation, increase dietary fiber  I spent a total of 45 minutes reviewing the patient's medical record, interviewing and examining the patient, discussing her diagnosis and management of her condition going forward, and documenting in the medical record   Jeanmarie Mccowen E. Candis Schatz, MD [facility]    CC: Venia Carbon, MD

## 2021-08-27 NOTE — Patient Instructions (Addendum)
If you are age 80 or older, your body mass index should be between 23-30. Your Body mass index is 22.13 kg/m. If this is out of the aforementioned range listed, please consider follow up with your Primary Care Provider.  If you are age 61 or younger, your body mass index should be between 19-25. Your Body mass index is 22.13 kg/m. If this is out of the aformentioned range listed, please consider follow up with your Primary Care Provider.    The [facility] providers would like to encourage you to use [facility] to communicate with providers for non-urgent requests or questions.  Due to long hold times on the telephone, sending your provider a message by [facility] be a faster and more efficient way to get a response.  Please allow 48 business hours for a response.  Please remember that this is for non-urgent requests.   It was a pleasure to see you today!  Thank you for trusting me with your gastrointestinal care!    Scott E.Candis Schatz, MD

## 2021-09-01 ENCOUNTER — Encounter: Payer: Self-pay | Admitting: Gastroenterology

## 2021-11-24 ENCOUNTER — Other Ambulatory Visit: Payer: Self-pay | Admitting: *Deleted

## 2021-11-24 DIAGNOSIS — E785 Hyperlipidemia, unspecified: Secondary | ICD-10-CM

## 2021-11-24 MED ORDER — REPATHA SURECLICK 140 MG/ML ~~LOC~~ SOAJ: 1.0000 "pen " | SUBCUTANEOUS | 1 refills | Status: DC

## 2022-02-10 ENCOUNTER — Other Ambulatory Visit: Payer: Self-pay | Admitting: *Deleted

## 2022-02-10 DIAGNOSIS — E785 Hyperlipidemia, unspecified: Secondary | ICD-10-CM

## 2022-02-10 MED ORDER — REPATHA SURECLICK 140 MG/ML ~~LOC~~ SOAJ: 1.0000 "pen " | SUBCUTANEOUS | 1 refills | Status: DC

## 2022-04-02 ENCOUNTER — Encounter: Payer: Medicare PPO | Admitting: Adult Health

## 2022-04-09 ENCOUNTER — Ambulatory Visit: Payer: Medicare PPO | Admitting: Cardiology

## 2022-04-09 ENCOUNTER — Encounter: Payer: Self-pay | Admitting: Cardiology

## 2022-04-09 VITALS — BP 136/60 | HR 49 | Ht 66.0 in | Wt 140.0 lb

## 2022-04-09 DIAGNOSIS — E785 Hyperlipidemia, unspecified: Secondary | ICD-10-CM | POA: Diagnosis not present

## 2022-04-09 DIAGNOSIS — Z951 Presence of aortocoronary bypass graft: Secondary | ICD-10-CM | POA: Diagnosis not present

## 2022-04-09 DIAGNOSIS — I10 Essential (primary) hypertension: Secondary | ICD-10-CM | POA: Diagnosis not present

## 2022-04-09 MED ORDER — METOPROLOL SUCCINATE ER 25 MG PO TB24: 12.5000 mg | ORAL_TABLET | Freq: Every day | ORAL | 3 refills | Status: DC

## 2022-04-09 MED ORDER — AMLODIPINE BESYLATE 10 MG PO TABS: 10.0000 mg | ORAL_TABLET | Freq: Every day | ORAL | 1 refills | Status: DC

## 2022-04-09 NOTE — Progress Notes (Signed)
?Cardiology Office Note:   ? ?Date:  04/09/2022  ? ?ID:  [name], DOB Dec 30, 1940, MRN [id number] ? ?PCP:  Venia Carbon, MD  ?Cardiologist:  Kate Sable, MD  ?Electrophysiologist:  None  ? ?Referring MD: Venia Carbon, MD  ? ?Chief Complaint  ?Patient presents with  ? Other  ?  6 month follow up -- Patient c/o cramping in his lower right leg. Meds reviewed verbally with patient.   ? ? ?History of Present Illness:   ? ?[name] is a 81 y.o. male with a hx of CAD, s/p CABG x4 in January 2021, hyperlipidemia, heart failure reduced ejection fraction, EF 45 to 50%, hypertension, hyperlipidemia, postop aflutter who presents for follow-up.  ? ?Patient being seen due to history of CAD/CABG, hypertension.  Patient states doing okay, developed a cough with taking lisinopril.  This was stopped, amlodipine 5 mg daily was started.  He states having low heart rates occasionally in the 30s to 40s.  Denies dizziness, syncope.  Denies chest pain.  Tolerating Repatha with no adverse effects.  Losartan tried in the past, patient did not tolerate. ? ?Historical notes ?originally seen in January 2021 with chest pain/NSTEMI.  Work-up via left heart cath revealed multivessel CAD.  He subsequently underwent CABG x4.  Last admitted a month ago when he developed chest pain and presented to the emergency room.  He was found to be in atrial flutter with RVR.  He was started on Lopressor and Xarelto.  Patient has history of easy bruising on Xarelto. He was taking Xarelto due to postop atrial flutter. He hasn't had atrial flutter since surgery. Xarelto was stopped   ? ?He has not been able to tolerate Lipitor, simvastatin, or Crestor.  Tolerating Zetia and lovastatin. ?Unable to tolerate lisinopril, losartan. ? ? ?Past Medical History:  ?Diagnosis Date  ? Arthritis   ? Bone spur 2010  ? right shoulder  ? CAD (coronary artery disease)   ? a. 12/2019 NSTEMI/Cath: LM 99ost/m, LAD 40, LCX nl, OM1 80, RCA 70ost/p, 99p, 30d, EF  45-50%; b. 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  ? Hyperlipidemia   ? Hypertension   ? IBS (irritable bowel syndrome)   ? Ischemic cardiomyopathy   ? a. 12/2019 TEE: EF 45-50%. Nl RV fxn. Mild MR.  ? ? ?Past Surgical History:  ?Procedure Laterality Date  ? BACK SURGERY    ? Lumbar  ? BUNIONECTOMY    ? CATARACT EXTRACTION, BILATERAL    ? COLONOSCOPY    ? CORONARY ARTERY BYPASS GRAFT N/A 01/08/2020  ? Procedure: CORONARY ARTERY BYPASS GRAFTING (CABG) x 4  WITH ENDOSCOPIC HARVESTING OF BILATERAL GREATER SAPHENOUS VEINS;  Surgeon: Ivin Poot, MD;  Location: [place];  Service: Open Heart Surgery;  Laterality: N/A;  ? HEMORRHOID SURGERY  2004  ? INGUINAL HERNIA REPAIR  01/2010  ? LEFT HEART CATH AND CORONARY ANGIOGRAPHY N/A 01/08/2020  ? Procedure: LEFT HEART CATH AND CORONARY ANGIOGRAPHY;  Surgeon: Wellington Hampshire, MD;  Location: [place] [facility];  Service: Cardiovascular;  Laterality: N/A;  ? ROTATOR CUFF REPAIR  06/2010  ? left/ bone spur Dr.Blackman  ? SHOULDER ARTHROSCOPY WITH OPEN ROTATOR CUFF REPAIR Right 05/26/2018  ? Procedure: SHOULDER ARTHROSCOPY WITH OPEN ROTATOR CUFF REPAIR;  Surgeon: Corky Mull, MD;  Location: [facility];  Service: Orthopedics;  Laterality: Right;  debridement, decompression  ? SKIN CANCER EXCISION  2003-2005  ? right shoulder  ? TEE WITHOUT CARDIOVERSION N/A 01/08/2020  ?  Procedure: TRANSESOPHAGEAL ECHOCARDIOGRAM (TEE);  Surgeon: Prescott Gum, Collier Salina, MD;  Location: [place];  Service: Open Heart Surgery;  Laterality: N/A;  ? ? ?Current Medications: ?Current Meds  ?Medication Sig  ? acetaminophen (TYLENOL) 325 MG tablet Take 650 mg by mouth as needed.  ? aspirin EC 81 MG tablet Take 81 mg by mouth daily.  ? Evolocumab (REPATHA SURECLICK) 062 MG/ML SOAJ Inject 1 pen into the skin every 14 (fourteen) days. NO FURTHER REFILLS UNTIL SEEN IN CLINIC. PLEASE KEEP SCHEDULED APPOINTMENT FOR April 2023  ? hyoscyamine (ANASPAZ) 0.125 MG TBDP disintergrating tablet Place 1 tablet  (0.125 mg total) under the tongue 2 (two) times daily as needed for cramping (For abdominal cramping).  ? lisinopril (ZESTRIL) 10 MG tablet Take 1 tablet (10 mg total) by mouth daily.  ? Psyllium (METAMUCIL PO) Take 1 Dose by mouth at bedtime. Mix with water  ? [DISCONTINUED] amLODipine (NORVASC) 5 MG tablet Take 5 mg by mouth daily.  ? [DISCONTINUED] metoprolol succinate (TOPROL-XL) 25 MG 24 hr tablet Take 1 tablet (25 mg total) by mouth daily. Take with or immediately following a meal.  ?  ? ?Allergies:   Crestor [rosuvastatin], Penicillins, Peanut-containing drug products, Atorvastatin, and Simvastatin  ? ?Social History  ? ?Socioeconomic History  ? Marital status: Married  ?  Spouse name: Not on file  ? Number of children: 2  ? Years of education: Not on file  ? Highest education level: Not on file  ?Occupational History  ? Occupation: retired Microbiologist  ?  Employer: retired  ?Tobacco Use  ? Smoking status: Never  ? Smokeless tobacco: Never  ?Vaping Use  ? Vaping Use: Never used  ?Substance and Sexual Activity  ? Alcohol use: Not Currently  ?  Alcohol/week: 1.0 standard drink  ?  Types: 1 Glasses of wine per week  ?  Comment: occassionally  ? Drug use: No  ? Sexual activity: Not on file  ?Other Topics Concern  ? Not on file  ?Social History Narrative  ? 2 sons, no contact  ? Splits his time between [place] and [place]  ? Has live-in since 2005  ? Has living will  ? Requests Bernice--girlfriend or brother Herbie [place], as health care POA  ? Would accept resuscitation but no prolonged artificial ventilation  ? Wouldn't want tube feeds if cognitively unaware  ? ?Social Determinants of Health  ? ?Financial Resource Strain: Not on file  ?Food Insecurity: Not on file  ?Transportation Needs: Not on file  ?Physical Activity: Not on file  ?Stress: Not on file  ?Social Connections: Not on file  ?  ? ?Family History: ?The patient's family history includes Heart attack in his father; Heart disease in  his brother. There is no history of Cancer, Diabetes, Colon cancer, Esophageal cancer, Stomach cancer, or Pancreatic cancer. ? ?ROS:   ?Please see the history of present illness.    ? All other systems reviewed and are negative. ? ?EKGs/Labs/Other Studies Reviewed:   ? ?The following studies were reviewed today: ? ? ?EKG:  EKG is  ordered today.  The ekg ordered today demonstrates sinus bradycardia heart rate 49 ?Recent Labs: ?07/06/2021: ALT 17; BUN 9; Creatinine, Ser 0.66; Hemoglobin 13.9; Platelets 241; Potassium 4.5; Sodium 128  ?Recent Lipid Panel ?   ?Component Value Date/Time  ? CHOL 120 08/09/2020 0733  ? TRIG 77 08/09/2020 0733  ? HDL 44 08/09/2020 0733  ? CHOLHDL 2.7 08/09/2020 0733  ? VLDL 15 08/09/2020 0733  ?  [place] 61 08/09/2020 0733  ? LDLDIRECT 188.8 04/05/2012 1254  ? ? ?Physical Exam:   ? ?VS:  BP 136/60 (BP Location: Left Arm, Patient Position: Sitting, Cuff Size: Normal)   Pulse (!) 49   Ht '5\' 6"'$  (1.676 m)   Wt 140 lb (63.5 kg)   BMI 22.60 kg/m?    ? ?Wt Readings from Last 3 Encounters:  ?04/09/22 140 lb (63.5 kg)  ?08/27/21 137 lb 2 oz (62.2 kg)  ?07/06/21 130 lb (59 kg)  ?  ? ?GEN:  Well nourished, well developed in no acute distress ?HEENT: Normal ?NECK: No JVD; No carotid bruits ?CARDIAC: RRR, no murmurs, rubs, gallops ?RESPIRATORY:  Clear to auscultation without rales, wheezing or rhonchi  ?ABDOMEN: Soft, non-tender, non-distended ?MUSCULOSKELETAL:  No edema; No deformity  ?SKIN: Warm and dry ?NEUROLOGIC:  Alert and oriented x 3 ?PSYCHIATRIC:  Normal affect  ? ?ASSESSMENT:   ? ?1. Hx of CABG   ?2. Hyperlipidemia LDL goal <70   ?3. Primary hypertension   ? ?PLAN:   ? ?In order of problems listed above: ? ?S/p CABG x 4. denies chest pain continue aspirin, Repatha.  Heart rate low at 44.  Decrease Toprol-XL to 25 mg daily.  Echo 2021 EF 40 to 45%.  Repeat echocardiogram to evaluate EF. ?hyperlipidemia, CAD.  Not tolerant to statins, LDL at goal .  Continue Repatha. ?Hypertension, BP  controlled.  Reduce Toprol-XL to 12.5 mg daily, increase Norvasc to 10 mg daily.   ? ?Follow-up in 6 weeks ? ?Total encounter time 40 minutes ? Greater than 50% was spent in counseling and coordination of care with the

## 2022-04-09 NOTE — Patient Instructions (Addendum)
Medication Instructions:  ? ?Your physician has recommended you make the following change in your medication:  ? ? DECREASE your Metoprolol Succinate (Toprol XL) to 12.5 MG once a day. ? ?2.    INCREASE your Amlodipine to 10 mg once a day. ? ? ?*If you need a refill on your cardiac medications before your next appointment, please call your pharmacy* ? ? ?Lab Work: ? ?None ordered ? ?If you have labs (blood work) drawn today and your tests are completely normal, you will receive your results only by: ?MyChart Message (if you have MyChart) OR ?A paper copy in the mail ?If you have any lab test that is abnormal or we need to change your treatment, we will call you to review the results. ? ? ?Testing/Procedures: ? ?Your physician has requested that you have an echocardiogram. Echocardiography is a painless test that uses sound waves to create images of your heart. It provides your doctor with information about the size and shape of your heart and how well your heart?s chambers and valves are working. This procedure takes approximately one hour. There are no restrictions for this procedure. ? ? ? ?Follow-Up: ?At [facility], you and your health needs are our priority.  As part of our continuing mission to provide you with exceptional heart care, we have created designated Provider Care Teams.  These Care Teams include your primary Cardiologist (physician) and Advanced Practice Providers (APPs -  Physician Assistants and Nurse Practitioners) who all work together to provide you with the care you need, when you need it. ? ?We recommend signing up for the patient portal called "MyChart".  Sign up information is provided on this After Visit Summary.  MyChart is used to connect with patients for Virtual Visits (Telemedicine).  Patients are able to view lab/test results, encounter notes, upcoming appointments, etc.  Non-urgent messages can be sent to your provider as well.   ?To learn more about what you can do with  MyChart, go to [email or web address].   ? ?Your next appointment:   ?6 week(s) ? ?The format for your next appointment:   ?In Person ? ?Provider:   ?You Heinkel see Kate Sable, MD or one of the following Advanced Practice Providers on your designated Care Team:   ?Murray Hodgkins, NP ?Christell Faith, PA-C ?Cadence Kathlen Mody, PA-C  ? ? ?Other Instructions ? ? ?Important Information About Sugar ? ? ? ? ? ? ? ?

## 2022-04-14 ENCOUNTER — Ambulatory Visit (INDEPENDENT_AMBULATORY_CARE_PROVIDER_SITE_OTHER): Payer: Medicare PPO

## 2022-04-14 DIAGNOSIS — Z951 Presence of aortocoronary bypass graft: Secondary | ICD-10-CM

## 2022-04-14 DIAGNOSIS — I25810 Atherosclerosis of coronary artery bypass graft(s) without angina pectoris: Secondary | ICD-10-CM | POA: Diagnosis not present

## 2022-04-14 LAB — ECHOCARDIOGRAM COMPLETE
AR max vel: 2.9 cm2
AV Area VTI: 3.6 cm2
AV Area mean vel: 3.08 cm2
AV Mean grad: 1 mmHg
AV Peak grad: 3 mmHg
AV Vena cont: 0.5 cm
Ao pk vel: 0.87 m/s
Area-P 1/2: 2.74 cm2
Calc EF: 55.1 %
P 1/2 time: 1028 msec
S' Lateral: 3.9 cm
Single Plane A2C EF: 52.3 %
Single Plane A4C EF: 56.9 %

## 2022-04-17 ENCOUNTER — Telehealth: Payer: Self-pay | Admitting: Cardiology

## 2022-04-17 NOTE — Telephone Encounter (Signed)
? ?  Pre-operative Risk Assessment  ?  ?Patient Name: Antonino Nienhuis Litt  ?DOB: Jul 27, 1941 ?MRN: [id number]  ? ?  ? ?Request for Surgical Clearance   ? ?Procedure:   Right Total Hip Arthroplasty  ? ?Date of Surgery:  Clearance TBD                              ?   ?Surgeon:  Rudene Christians ?Surgeon's Group or Practice Name:  [facility] ?Phone number:  [phone] ?Fax number:  [phone] ?  ?Type of Clearance Requested:   ?- Medical  ?- Pharmacy:  Hold    please advise  ?  ?Type of Anesthesia:  Not Indicated ?  ?Additional requests/questions:   ? ?Signed, ?Clarisse Gouge   ?04/17/2022, 11:44 AM  ? ?

## 2022-04-20 NOTE — Telephone Encounter (Signed)
? ?  Patient Name: [name]  ?DOB: 1941/02/27 ?MRN: [id number] ? ?Primary Cardiologist: Kate Sable, MD ? ?Chart reviewed as part of pre-operative protocol coverage.  Patient recently seen in clinic 04/09/22 with bradycardia requiring medication adjustment. The patient already has an upcoming appointment scheduled 05/01/22 with Dr. Garen Lah at which time this hip surgery clearance can be addressed in case there are any new issues addressed that would impact pre-operative recommendations. ? ?- I added "preop" comment to appointment notes so that provider is aware to address at time of OV. Per office protocol, the provider seeing this patient should forward their finalized clearance decision to requesting party below. ? ?- Will fax update to requesting surgeon so they are aware and remove from preop box as separate preop APP input not necessary at this time. ? ?Charlie Pitter, PA-C ?04/20/2022, 9:57 AM ? ?

## 2022-05-01 ENCOUNTER — Encounter: Payer: Self-pay | Admitting: Cardiology

## 2022-05-01 ENCOUNTER — Ambulatory Visit: Payer: Medicare PPO | Admitting: Cardiology

## 2022-05-01 VITALS — BP 136/62 | HR 51 | Ht 66.0 in | Wt 140.0 lb

## 2022-05-01 DIAGNOSIS — Z951 Presence of aortocoronary bypass graft: Secondary | ICD-10-CM

## 2022-05-01 DIAGNOSIS — Z01810 Encounter for preprocedural cardiovascular examination: Secondary | ICD-10-CM | POA: Diagnosis not present

## 2022-05-01 DIAGNOSIS — E785 Hyperlipidemia, unspecified: Secondary | ICD-10-CM

## 2022-05-01 DIAGNOSIS — I10 Essential (primary) hypertension: Secondary | ICD-10-CM | POA: Diagnosis not present

## 2022-05-01 NOTE — Progress Notes (Signed)
?Cardiology Office Note:   ? ?Date:  05/01/2022  ? ?ID:  [name], DOB 11/15/1941, MRN [id number] ? ?PCP:  Venia Carbon, MD  ?Cardiologist:  Kate Sable, MD  ?Electrophysiologist:  None  ? ?Referring MD: Venia Carbon, MD  ? ?Chief Complaint  ?Patient presents with  ? Other  ?  Follow up post ECHO and patient needs pre op clearance -- Meds reviewed verbally with patient.   ? ? ?History of Present Illness:   ? ?[name] is a 81 y.o. male with a hx of CAD, s/p CABG x4 in January 2021, hyperlipidemia, heart failure reduced ejection fraction, EF 45 to 50%, hypertension, hyperlipidemia, postop aflutter who presents for follow-up. ? ?Being seen for CAD, hypertension.  Previous echo showed mildly reduced function, repeat echocardiogram was ordered.  Toprol-XL was reduced due to bradycardia occasionally in the 30s to 40s.  He states heart rate has improved since reducing dose of Toprol-XL to 12.5 mg daily.  Heart rate currently in the 50s.  He has right hip arthritis, planning surgery soon.  Denies chest pain.  Denies syncope, shortness of breath.  Apart from hip pain, he otherwise feels okay. ? ?Historical notes ?originally seen in January 2021 with chest pain/NSTEMI.  Work-up via left heart cath revealed multivessel CAD.  He subsequently underwent CABG x4.  Last admitted a month ago when he developed chest pain and presented to the emergency room.  He was found to be in atrial flutter with RVR.  He was started on Lopressor and Xarelto.  Patient has history of easy bruising on Xarelto. He was taking Xarelto due to postop atrial flutter. He hasn't had atrial flutter since surgery. Xarelto was stopped   ? ?He has not been able to tolerate Lipitor, simvastatin, or Crestor.  Tolerating Zetia and lovastatin. ?Unable to tolerate lisinopril, losartan. ? ? ?Past Medical History:  ?Diagnosis Date  ? Arthritis   ? Bone spur 2010  ? right shoulder  ? CAD (coronary artery disease)   ? a. 12/2019 NSTEMI/Cath: LM  99ost/m, LAD 40, LCX nl, OM1 80, RCA 70ost/p, 99p, 30d, EF 45-50%; b. 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  ? Hyperlipidemia   ? Hypertension   ? IBS (irritable bowel syndrome)   ? Ischemic cardiomyopathy   ? a. 12/2019 TEE: EF 45-50%. Nl RV fxn. Mild MR.  ? ? ?Past Surgical History:  ?Procedure Laterality Date  ? BACK SURGERY    ? Lumbar  ? BUNIONECTOMY    ? CATARACT EXTRACTION, BILATERAL    ? COLONOSCOPY    ? CORONARY ARTERY BYPASS GRAFT N/A 01/08/2020  ? Procedure: CORONARY ARTERY BYPASS GRAFTING (CABG) x 4  WITH ENDOSCOPIC HARVESTING OF BILATERAL GREATER SAPHENOUS VEINS;  Surgeon: Ivin Poot, MD;  Location: [facility];  Service: Open Heart Surgery;  Laterality: N/A;  ? HEMORRHOID SURGERY  2004  ? INGUINAL HERNIA REPAIR  01/2010  ? LEFT HEART CATH AND CORONARY ANGIOGRAPHY N/A 01/08/2020  ? Procedure: LEFT HEART CATH AND CORONARY ANGIOGRAPHY;  Surgeon: Wellington Hampshire, MD;  Location: [facility];  Service: Cardiovascular;  Laterality: N/A;  ? ROTATOR CUFF REPAIR  06/2010  ? left/ bone spur Dr.Blackman  ? SHOULDER ARTHROSCOPY WITH OPEN ROTATOR CUFF REPAIR Right 05/26/2018  ? Procedure: SHOULDER ARTHROSCOPY WITH OPEN ROTATOR CUFF REPAIR;  Surgeon: Corky Mull, MD;  Location: [facility];  Service: Orthopedics;  Laterality: Right;  debridement, decompression  ? SKIN CANCER EXCISION  2003-2005  ? right  shoulder  ? TEE WITHOUT CARDIOVERSION N/A 01/08/2020  ? Procedure: TRANSESOPHAGEAL ECHOCARDIOGRAM (TEE);  Surgeon: Prescott Gum, Collier Salina, MD;  Location: [place] [facility];  Service: Open Heart Surgery;  Laterality: N/A;  ? ? ?Current Medications: ?Current Meds  ?Medication Sig  ? ACETAMINOPHEN PO Take 650 mg by mouth as needed.  ? amLODipine (NORVASC) 10 MG tablet Take 1 tablet (10 mg total) by mouth daily.  ? aspirin EC 81 MG tablet Take 81 mg by mouth daily.  ? Calcium Carb-Cholecalciferol (CALCIUM 600/VITAMIN D PO) Take by mouth.  ? Evolocumab (REPATHA SURECLICK) 277 MG/ML SOAJ Inject 1 pen into the skin every  14 (fourteen) days. NO FURTHER REFILLS UNTIL SEEN IN CLINIC. PLEASE KEEP SCHEDULED APPOINTMENT FOR April 2023  ? hyoscyamine (ANASPAZ) 0.125 MG TBDP disintergrating tablet Place 1 tablet (0.125 mg total) under the tongue 2 (two) times daily as needed for cramping (For abdominal cramping).  ? metoprolol succinate (TOPROL-XL) 25 MG 24 hr tablet Take 0.5 tablets (12.5 mg total) by mouth daily. Take with or immediately following a meal.  ? Omega-3 Fatty Acids (FISH OIL) 1000 MG CAPS Take 1 capsule by mouth daily.  ? Psyllium (METAMUCIL PO) Take 1 Dose by mouth at bedtime. Mix with water  ?  ? ?Allergies:   Crestor [rosuvastatin], Penicillins, Peanut-containing drug products, Atorvastatin, and Simvastatin  ? ?Social History  ? ?Socioeconomic History  ? Marital status: Married  ?  Spouse name: Not on file  ? Number of children: 2  ? Years of education: Not on file  ? Highest education level: Not on file  ?Occupational History  ? Occupation: retired Microbiologist  ?  Employer: retired  ?Tobacco Use  ? Smoking status: Never  ? Smokeless tobacco: Never  ?Vaping Use  ? Vaping Use: Never used  ?Substance and Sexual Activity  ? Alcohol use: Not Currently  ?  Alcohol/week: 1.0 standard drink  ?  Types: 1 Glasses of wine per week  ?  Comment: occassionally  ? Drug use: No  ? Sexual activity: Not on file  ?Other Topics Concern  ? Not on file  ?Social History Narrative  ? 2 sons, no contact  ? Splits his time between [place] and [place]  ? Has live-in since 2005  ? Has living will  ? Requests Bernice--girlfriend or brother Herbie [place], as health care POA  ? Would accept resuscitation but no prolonged artificial ventilation  ? Wouldn't want tube feeds if cognitively unaware  ? ?Social Determinants of Health  ? ?Financial Resource Strain: Not on file  ?Food Insecurity: Not on file  ?Transportation Needs: Not on file  ?Physical Activity: Not on file  ?Stress: Not on file  ?Social Connections: Not on file  ?   ? ?Family History: ?The patient's family history includes Heart attack in his father; Heart disease in his brother. There is no history of Cancer, Diabetes, Colon cancer, Esophageal cancer, Stomach cancer, or Pancreatic cancer. ? ?ROS:   ?Please see the history of present illness.    ? All other systems reviewed and are negative. ? ?EKGs/Labs/Other Studies Reviewed:   ? ?The following studies were reviewed today: ? ? ?EKG:  EKG is  ordered today.  The ekg ordered today demonstrates sinus bradycardia heart rate 51 ?Recent Labs: ?07/06/2021: ALT 17; BUN 9; Creatinine, Ser 0.66; Hemoglobin 13.9; Platelets 241; Potassium 4.5; Sodium 128  ?Recent Lipid Panel ?   ?Component Value Date/Time  ? CHOL 120 08/09/2020 0733  ? TRIG 77 08/09/2020  0017  ? HDL 44 08/09/2020 0733  ? CHOLHDL 2.7 08/09/2020 0733  ? VLDL 15 08/09/2020 0733  ? Davison 61 08/09/2020 0733  ? LDLDIRECT 188.8 04/05/2012 1254  ? ? ?Physical Exam:   ? ?VS:  BP 136/62 (BP Location: Left Arm, Patient Position: Sitting, Cuff Size: Normal)   Pulse (!) 51   Ht '5\' 6"'$  (1.676 m)   Wt 140 lb (63.5 kg)   SpO2 98%   BMI 22.60 kg/m?    ? ?Wt Readings from Last 3 Encounters:  ?05/01/22 140 lb (63.5 kg)  ?04/09/22 140 lb (63.5 kg)  ?08/27/21 137 lb 2 oz (62.2 kg)  ?  ? ?GEN:  Well nourished, well developed in no acute distress ?HEENT: Normal ?NECK: No JVD; No carotid bruits ?CARDIAC: RRR, no murmurs, rubs, gallops ?RESPIRATORY:  Clear to auscultation without rales, wheezing or rhonchi  ?ABDOMEN: Soft, non-tender, non-distended ?MUSCULOSKELETAL:  No edema; No deformity  ?SKIN: Warm and dry ?NEUROLOGIC:  Alert and oriented x 3 ?PSYCHIATRIC:  Normal affect  ? ?ASSESSMENT:   ? ?1. Pre-operative cardiovascular examination   ?2. Hx of CABG   ?3. Hyperlipidemia LDL goal <70   ?4. Primary hypertension   ? ? ?PLAN:   ? ?In order of problems listed above: ? ?Preop evaluation, right hip surgery being planned.  Denies chest pain, echo with preserved ejection fraction.  Okay to  proceed with surgical procedure from a cardiac perspective. ?S/p CABG x 4. denies chest pain .continue aspirin, Repatha, Toprol-XL 12.5 mg daily.  Echo with improved EF, normal EF 50 to 55% . ?hyperlipidemia, CAD.

## 2022-05-01 NOTE — Patient Instructions (Signed)
Medication Instructions:  ?Your physician recommends that you continue on your current medications as directed. Please refer to the Current Medication list given to you today. ? ?*If you need a refill on your cardiac medications before your next appointment, please call your pharmacy* ? ? ?Lab Work: ?None ordered ?If you have labs (blood work) drawn today and your tests are completely normal, you will receive your results only by: ?MyChart Message (if you have MyChart) OR ?A paper copy in the mail ?If you have any lab test that is abnormal or we need to change your treatment, we will call you to review the results. ? ? ?Testing/Procedures: ?None ordered ? ? ?Follow-Up: ?At [facility], you and your health needs are our priority.  As part of our continuing mission to provide you with exceptional heart care, we have created designated Provider Care Teams.  These Care Teams include your primary Cardiologist (physician) and Advanced Practice Providers (APPs -  Physician Assistants and Nurse Practitioners) who all work together to provide you with the care you need, when you need it. ? ?We recommend signing up for the patient portal called "MyChart".  Sign up information is provided on this After Visit Summary.  MyChart is used to connect with patients for Virtual Visits (Telemedicine).  Patients are able to view lab/test results, encounter notes, upcoming appointments, etc.  Non-urgent messages can be sent to your provider as well.   ?To learn more about what you can do with MyChart, go to [email or web address].   ? ?Your next appointment:   ?6 month(s) ? ?The format for your next appointment:   ?In Person ? ?Provider:   ?You Vancleve see Kate Sable, MD or one of the following Advanced Practice Providers on your designated Care Team:   ?Murray Hodgkins, NP ?Christell Faith, PA-C ?Cadence Kathlen Mody, PA-C  ? ? ?Other Instructions ? ? ?Important Information About Sugar ? ? ? ? ?  ? ?

## 2022-06-17 ENCOUNTER — Telehealth: Payer: Self-pay | Admitting: Cardiology

## 2022-06-17 DIAGNOSIS — E785 Hyperlipidemia, unspecified: Secondary | ICD-10-CM

## 2022-06-17 MED ORDER — REPATHA SURECLICK 140 MG/ML ~~LOC~~ SOAJ: 1.0000 "pen " | SUBCUTANEOUS | 3 refills | Status: DC

## 2022-06-17 NOTE — Telephone Encounter (Signed)
Called patient's significant other. Advised her that refills were being sent in, verified preferred pharmacy. Claudia voiced appreciation for the call.

## 2022-06-17 NOTE — Telephone Encounter (Signed)
Pt c/o medication issue:  1. Name of Medication:   Evolocumab (REPATHA SURECLICK) 761 MG/ML SOAJ    2. How are you currently taking this medication (dosage and times per day)? Inject 1 pen into the skin every 14 (fourteen) days. NO FURTHER REFILLS UNTIL SEEN IN CLINIC. PLEASE KEEP SCHEDULED APPOINTMENT FOR April 2023  3. Are you having a reaction (difficulty breathing--STAT)? No  4. What is your medication issue?  States that pharmacy says there are not anymore refills on medication. Pt is about out, Please advise   Pt has scheduled appt on 08/03/22

## 2022-06-22 ENCOUNTER — Other Ambulatory Visit: Payer: Self-pay | Admitting: Orthopedic Surgery

## 2022-07-02 ENCOUNTER — Inpatient Hospital Stay: Admission: RE | Admit: 2022-07-02 | Payer: Medicare PPO | Source: Ambulatory Visit

## 2022-07-03 ENCOUNTER — Encounter
Admission: RE | Admit: 2022-07-03 | Discharge: 2022-07-03 | Disposition: A | Discharge status: Home / Self Care | Payer: Medicare PPO | Source: Ambulatory Visit | Attending: Orthopedic Surgery | Admitting: Orthopedic Surgery

## 2022-07-03 VITALS — BP 153/63 | HR 59 | Resp 16 | Ht 66.0 in | Wt 140.7 lb

## 2022-07-03 DIAGNOSIS — Z01818 Encounter for other preprocedural examination: Secondary | ICD-10-CM

## 2022-07-03 DIAGNOSIS — Z01812 Encounter for preprocedural laboratory examination: Secondary | ICD-10-CM | POA: Insufficient documentation

## 2022-07-03 DIAGNOSIS — Z862 Personal history of diseases of the blood and blood-forming organs and certain disorders involving the immune mechanism: Secondary | ICD-10-CM | POA: Insufficient documentation

## 2022-07-03 HISTORY — DX: Presence of aortocoronary bypass graft: Z95.1

## 2022-07-03 HISTORY — DX: Hypo-osmolality and hyponatremia: E87.1

## 2022-07-03 HISTORY — DX: Non-ST elevation (NSTEMI) myocardial infarction: I21.4

## 2022-07-03 HISTORY — DX: Diverticulosis of intestine, part unspecified, without perforation or abscess without bleeding: K57.90

## 2022-07-03 HISTORY — DX: Other complications of anesthesia, initial encounter: T88.59XA

## 2022-07-03 HISTORY — DX: Pleural effusion, not elsewhere classified: J90

## 2022-07-03 HISTORY — DX: Unspecified atrial flutter: I48.92

## 2022-07-03 HISTORY — DX: Malignant (primary) neoplasm, unspecified: C80.1

## 2022-07-03 LAB — URINALYSIS, ROUTINE W REFLEX MICROSCOPIC
Bilirubin Urine: NEGATIVE
Glucose, UA: NEGATIVE mg/dL
Hgb urine dipstick: NEGATIVE
Ketones, ur: NEGATIVE mg/dL
Leukocytes,Ua: NEGATIVE
Nitrite: NEGATIVE
Protein, ur: NEGATIVE mg/dL
Specific Gravity, Urine: 1.003 — ABNORMAL LOW (ref 1.005–1.030)
pH: 8 (ref 5.0–8.0)

## 2022-07-03 LAB — CBC WITH DIFFERENTIAL/PLATELET
Abs Immature Granulocytes: 0.03 10*3/uL (ref 0.00–0.07)
Basophils Absolute: 0.1 10*3/uL (ref 0.0–0.1)
Basophils Relative: 1 %
Eosinophils Absolute: 0.1 10*3/uL (ref 0.0–0.5)
Eosinophils Relative: 2 %
HCT: 43.2 % (ref 39.0–52.0)
Hemoglobin: 14.5 g/dL (ref 13.0–17.0)
Immature Granulocytes: 0 %
Lymphocytes Relative: 24 %
Lymphs Abs: 1.7 10*3/uL (ref 0.7–4.0)
MCH: 29.6 pg (ref 26.0–34.0)
MCHC: 33.6 g/dL (ref 30.0–36.0)
MCV: 88.2 fL (ref 80.0–100.0)
Monocytes Absolute: 0.7 10*3/uL (ref 0.1–1.0)
Monocytes Relative: 10 %
Neutro Abs: 4.5 10*3/uL (ref 1.7–7.7)
Neutrophils Relative %: 63 %
Platelets: 240 10*3/uL (ref 150–400)
RBC: 4.9 MIL/uL (ref 4.22–5.81)
RDW: 12.6 % (ref 11.5–15.5)
WBC: 7.1 10*3/uL (ref 4.0–10.5)
nRBC: 0 % (ref 0.0–0.2)

## 2022-07-03 LAB — COMPREHENSIVE METABOLIC PANEL
ALT: 22 U/L (ref 0–44)
AST: 22 U/L (ref 15–41)
Albumin: 4.9 g/dL (ref 3.5–5.0)
Alkaline Phosphatase: 116 U/L (ref 38–126)
Anion gap: 6 (ref 5–15)
BUN: 13 mg/dL (ref 8–23)
CO2: 27 mmol/L (ref 22–32)
Calcium: 9.7 mg/dL (ref 8.9–10.3)
Chloride: 103 mmol/L (ref 98–111)
Creatinine, Ser: 0.82 mg/dL (ref 0.61–1.24)
GFR, Estimated: >60 mL/min (ref >60)
Glucose, Bld: 96 mg/dL (ref 70–99)
Potassium: 3.9 mmol/L (ref 3.5–5.1)
Sodium: 136 mmol/L (ref 135–145)
Total Bilirubin: 1.3 mg/dL — ABNORMAL HIGH (ref 0.3–1.2)
Total Protein: 8.5 g/dL — ABNORMAL HIGH (ref 6.5–8.1)

## 2022-07-03 LAB — SURGICAL PCR SCREEN
MRSA, PCR: NEGATIVE
Staphylococcus aureus: NEGATIVE

## 2022-07-03 NOTE — Patient Instructions (Addendum)
Your procedure is scheduled on:07-14-22 Tuesday Report to the Registration Desk on the 1st floor of the [facility].Then proceed to the 2nd floor Surgery Desk To find out your arrival time, please call [phone] between 1PM - 3PM on:07-13-22 Monday If your arrival time is 6:00 am, do not arrive prior to that time as the [facility] entrance doors do not open until 6:00 am.  REMEMBER: Instructions that are not followed completely Mazariego result in serious medical risk, up to and including death; or upon the discretion of your surgeon and anesthesiologist your surgery Vacca need to be rescheduled.  Do not eat food after midnight the night before surgery.  No gum chewing, lozengers or hard candies.  You Bring however, drink CLEAR liquids up to 2 hours before you are scheduled to arrive for your surgery. Do not drink anything within 2 hours of your scheduled arrival time.  Clear liquids include: - water  - apple juice without pulp - gatorade (not RED colors) - black coffee or tea (Do NOT add milk or creamers to the coffee or tea) Do NOT drink anything that is not on this list.  In addition, your doctor has ordered for you to drink the provided  Ensure Pre-Surgery Clear Carbohydrate Drink  Drinking this carbohydrate drink up to two hours before surgery helps to reduce insulin resistance and improve patient outcomes. Please complete drinking 2 hours prior to scheduled arrival time.  TAKE THESE MEDICATIONS THE MORNING OF SURGERY WITH A SIP OF WATER: -amLODipine (NORVASC)  -metoprolol succinate (TOPROL-XL)   Continue your 81 mg Aspirin up until the day prior to surgery-Do NOT take the morning of surgery  One week prior to surgery:Last dose on 07-06-22 Monday Stop Anti-inflammatories (NSAIDS) such as meloxicam (MOBIC) ,Advil, Aleve, Ibuprofen, Motrin, Naproxen, Naprosyn and Aspirin based products such as Excedrin, Goodys Powder, BC Powder.You Luevanos however, take Tylenol if needed for pain up until  the day of surgery.  Stop ANY OVER THE COUNTER supplements/vitamins 7 days prior to surgery (Fish Oil, Magnesium, Calcium)  No Alcohol for 24 hours before or after surgery.  No Smoking including e-cigarettes for 24 hours prior to surgery.  No chewable tobacco products for at least 6 hours prior to surgery.  No nicotine patches on the day of surgery.  Do not use any "recreational" drugs for at least a week prior to your surgery.  Please be advised that the combination of cocaine and anesthesia Sako have negative outcomes, up to and including death. If you test positive for cocaine, your surgery will be cancelled.  On the morning of surgery brush your teeth with toothpaste and water, you Dollar rinse your mouth with mouthwash if you wish. Do not swallow any toothpaste or mouthwash.  Use CHG Soap as directed on instruction sheet.  Do not wear jewelry, make-up, hairpins, clips or nail polish.  Do not wear lotions, powders, or perfumes.   Do not shave body from the neck down 48 hours prior to surgery just in case you cut yourself which could leave a site for infection.  Also, freshly shaved skin Sallie become irritated if using the CHG soap.  Contact lenses, hearing aids and dentures Verba not be worn into surgery.  Do not bring valuables to the hospital. [facility] is not responsible for any missing/lost belongings or valuables.   Notify your doctor if there is any change in your medical condition (cold, fever, infection).  Wear comfortable clothing (specific to your surgery type) to the hospital.  After surgery, you can help prevent lung complications by doing breathing exercises.  Take deep breaths and cough every 1-2 hours. Your doctor Schmaltz order a device called an Incentive Spirometer to help you take deep breaths. When coughing or sneezing, hold a pillow firmly against your incision with both hands. This is called "splinting." Doing this helps protect your incision. It also decreases  belly discomfort.  If you are being admitted to the hospital overnight, leave your suitcase in the car. After surgery it Giddings be brought to your room.  If you are being discharged the day of surgery, you will not be allowed to drive home. You will need a responsible adult (18 years or older) to drive you home and stay with you that night.   If you are taking public transportation, you will need to have a responsible adult (18 years or older) with you. Please confirm with your physician that it is acceptable to use public transportation.   Please call the [facility] at [phone] if you have any questions about these instructions.  Surgery Visitation Policy:  Patients undergoing a surgery or procedure Lynde have two family members or support persons with them as long as the person is not COVID-19 positive or experiencing its symptoms.   Inpatient Visitation:    Visiting hours are 7 a.m. to 8 p.m. Up to four visitors are allowed at one time in a patient room, including children. The visitors Calma rotate out with other people during the day. One designated support person (adult) Mamula remain overnight.     How to Use an Incentive Spirometer An incentive spirometer is a tool that measures how well you are filling your lungs with each breath. Learning to take long, deep breaths using this tool can help you keep your lungs clear and active. This Nuzum help to reverse or lessen your chance of developing breathing (pulmonary) problems, especially infection. You Shaker be asked to use a spirometer: After a surgery. If you have a lung problem or a history of smoking. After a long period of time when you have been unable to move or be active. If the spirometer includes an indicator to show the highest number that you have reached, your health care provider or respiratory therapist will help you set a goal. Keep a log of your progress as told by your health care provider. What are the  risks? Breathing too quickly Ewell cause dizziness or cause you to pass out. Take your time so you do not get dizzy or light-headed. If you are in pain, you Bieri need to take pain medicine before doing incentive spirometry. It is harder to take a deep breath if you are having pain. How to use your incentive spirometer  Sit up on the edge of your bed or on a chair. Hold the incentive spirometer so that it is in an upright position. Before you use the spirometer, breathe out normally. Place the mouthpiece in your mouth. Make sure your lips are closed tightly around it. Breathe in slowly and as deeply as you can through your mouth, causing the piston or the ball to rise toward the top of the chamber. Hold your breath for 3-5 seconds, or for as long as possible. If the spirometer includes a coach indicator, use this to guide you in breathing. Slow down your breathing if the indicator goes above the marked areas. Remove the mouthpiece from your mouth and breathe out normally. The piston or ball will return to  the bottom of the chamber. Rest for a few seconds, then repeat the steps 10 or more times. Take your time and take a few normal breaths between deep breaths so that you do not get dizzy or light-headed. Do this every 1-2 hours when you are awake. If the spirometer includes a goal marker to show the highest number you have reached (best effort), use this as a goal to work toward during each repetition. After each set of 10 deep breaths, cough a few times. This will help to make sure that your lungs are clear. If you have an incision on your chest or abdomen from surgery, place a pillow or a rolled-up towel firmly against the incision when you cough. This can help to reduce pain while taking deep breaths and coughing. General tips When you are able to get out of bed: Walk around often. Continue to take deep breaths and cough in order to clear your lungs. Keep using the incentive spirometer until  your health care provider says it is okay to stop using it. If you have been in the hospital, you Petersen be told to keep using the spirometer at home. Contact a health care provider if: You are having difficulty using the spirometer. You have trouble using the spirometer as often as instructed. Your pain medicine is not giving enough relief for you to use the spirometer as told. You have a fever. Get help right away if: You develop shortness of breath. You develop a cough with bloody mucus from the lungs. You have fluid or blood coming from an incision site after you cough. Summary An incentive spirometer is a tool that can help you learn to take long, deep breaths to keep your lungs clear and active. You Severin be asked to use a spirometer after a surgery, if you have a lung problem or a history of smoking, or if you have been inactive for a long period of time. Use your incentive spirometer as instructed every 1-2 hours while you are awake. If you have an incision on your chest or abdomen, place a pillow or a rolled-up towel firmly against your incision when you cough. This will help to reduce pain. Get help right away if you have shortness of breath, you cough up bloody mucus, or blood comes from your incision when you cough. This information is not intended to replace advice given to you by your health care provider. Make sure you discuss any questions you have with your health care provider. Document Revised: 02/26/2020 Document Reviewed: 02/26/2020 Elsevier Patient Education  [place].

## 2022-07-05 ENCOUNTER — Encounter: Payer: Self-pay | Admitting: Orthopedic Surgery

## 2022-07-06 ENCOUNTER — Encounter: Payer: Self-pay | Admitting: Orthopedic Surgery

## 2022-07-06 NOTE — Progress Notes (Signed)
Perioperative Services  Pre-Admission/Anesthesia Testing Clinical Review  Date: 07/06/22  [name] Demographics:  Name: [name] DOB:   10-Aug-1941 MRN:   [id number]  Planned Surgical Procedure(s):    Case: [id number] Date/Time: 07/14/22 0945   Procedure: TOTAL HIP ARTHROPLASTY ANTERIOR APPROACH (Right: Hip)   Anesthesia type: Choice   Pre-op diagnosis: Primary localized osteoarthritis of right hip  M16.11   Location: [facility] OR ROOM 01 / [place] [facility] FOR ANESTHESIA [facility]   Surgeons: Hessie Knows, MD   NOTE: Available PAT nursing documentation and vital signs have been reviewed. Clinical nursing staff has updated [name]'s PMH/PSHx, current medication list, and drug allergies/intolerances to ensure comprehensive history available to assist in medical decision making as it pertains to the aforementioned surgical procedure and anticipated anesthetic course. Extensive review of available clinical information performed. [place] PMH and PSHx updated with any diagnoses/procedures that  [name] have been inadvertently omitted during his intake with the pre-admission testing department's nursing staff.  Clinical Discussion:  [name] is a 81 y.o. male who is submitted for pre-surgical anesthesia review and clearance prior to him undergoing the above procedure. [name] has never been a smoker. Pertinent PMH includes: CAD (s/p CABG), NSTEMI, ischemic cardiomyopathy, HFrEF, postoperative atrial fibrillation, HTN, HLD, OA.  [name] is followed by cardiology Garen Lah, MD). [name] was last seen in the cardiology clinic on 05/01/2022; notes reviewed.  At the time of his clinic visit, [name] doing well overall from a cardiovascular perspective.  [name] denied any episodes of chest pain, shortness of breath, PND, orthopnea, palpitations, significant peripheral edema, vertiginous symptoms, or presyncope/syncope.  [name] with a past medical history significant for cardiovascular diagnoses.  [name] suffered an  NSTEMI on 01/07/2020.  Diagnostic left heart catheterization was performed on 01/08/2020 revealing a mildly reduced left ventricular systolic function with an EF of 45-50%.  LVEDP moderately elevated at 22 mmHg.  There was multivessel CAD; 99% ostial to mid LM, 80% OM1, 40% mid LAD, 70% ostial to proximal RCA, 99% proximal RCA, and 30% distal RCA.  Recommendation was for consultation with CVTS for emergent CABG.  [name] was transferred to [facility].  [name] underwent a four-vessel CABG at [facility] on 01/08/2020.  LIMA-LAD, SVG-RPDA, SVG-OM1, and SVG-OM2 bypass grafts were placed.  Long-term cardiac event monitor study performed on 02/22/2020 revealing a predominant underlying sinus rhythm at an average rate of 60 bpm; range 43-146 bpm.  There were occasional SVT runs noted with the fastest interval lasting 5 beats at a maximum rate of 146.  There was no evidence of atrial fibrillation or atrial flutter noted.  Isolated SVE's and SVE couplets were rare (<1.0%).  Last TTE was performed on 04/14/2022 revealing a low normal left ventricular systolic function with an EF of 50-55%.  There was septal hypokinesis noted. Diastolic Doppler parameters consistent with pseudonormalization (G2DD).  GLS -18.3%.  Mild to moderate mitral valve regurgitation noted.  There was aortic valve sclerosis with no evidence of stenosis.  Blood pressure reasonably controlled at 136/62 on currently prescribed CCB and beta-blocker therapies.  [name] is on a PCSK9i (evolocumab) + omega-3 fatty acid for his HLD diagnosis and further ASCVD prevention.  [name] is not diabetic.  Functional capacity limited by age and arthritides, however [name] still felt to be able to achieve at least 4 METS of activity without angina/anginal equivalent symptoms.  No changes were made to his medication regimen.  [name] to follow-up with outpatient cardiology in 6 months or sooner if needed.   [name] is scheduled for an elective  RIGHT TOTAL HIP ARTHROPLASTY ANTERIOR APPROACH on 07/14/2022 with Dr. Hessie Knows, MD. Given [name]'s past medical history significant for cardiovascular diagnoses, presurgical cardiac clearance was sought by the PAT team.  Per cardiology, "[name] denies chest pain. Recent echo showed preserved ejection fraction. Okay to proceed with surgical procedure from a cardiac perspective at an overall ACCEPTABLE risk". In review of his medication reconciliation, it is noted the [name] is on daily antiplatelet therapy.  Per instructions from [name]'s primary attending surgeon, [name] [name] continue his daily low-dose ASA dose throughout his perioperative course, holding only on the morning of his procedure.    [name] reports previous perioperative complications with anesthesia in the past.  [name] has a history of both (+) delayed emergence and (+) emergence delirium in the past following general anesthesia. In review of the available records, it is noted that [name] underwent a general anesthetic course at [facility] (ASA III) in 12/2019 without documented complications.      07/03/2022   12:00 PM 05/01/2022    9:18 AM 04/09/2022    8:39 AM  Vitals with BMI  Height '5\' 6"'$  '5\' 6"'$  '5\' 6"'$   Weight 140 lbs 10 oz 140 lbs 140 lbs  BMI 22.71 59.93 57.01  Systolic 779 390 300  Diastolic 63 62 60  Pulse 59 51 49    Providers/Specialists:   NOTE: Primary physician provider listed below. [name] [name] have been seen by APP or partner within same practice.   PROVIDER ROLE / SPECIALTY LAST Fabio Bering, MD Orthopedics (Surgeon) 06/17/2022  Venia Carbon, MD Primary Care Provider ???  Kate Sable, MD Cardiology 05/01/2022   Allergies:  Crestor [rosuvastatin], Penicillins, Peanut-containing drug products, Atorvastatin, and Simvastatin  Current Home Medications:   No current facility-administered medications for this encounter.    ACETAMINOPHEN PO   amLODipine (NORVASC) 10 MG tablet    aspirin EC 81 MG tablet   Calcium Carbonate Antacid (CALCIUM CARBONATE PO)   Evolocumab (REPATHA SURECLICK) 923 MG/ML SOAJ   Magnesium 200 MG TABS   meloxicam (MOBIC) 7.5 MG tablet   metoprolol succinate (TOPROL-XL) 25 MG 24 hr tablet   Omega-3 Fatty Acids (FISH OIL) 1000 MG CAPS   Psyllium (METAMUCIL PO)   History:   Past Medical History:  Diagnosis Date   Arthritis    Bilateral pleural effusion    Bone spur 2010   right shoulder   CAD (coronary artery disease)    a. 12/2019 NSTEMI/Cath: LM 99ost/m, LAD 40, LCX nl, OM1 80, RCA 70ost/p, 99p, 30d, EF 45-50%; b. 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   Complication of anesthesia    a.) delayed emergence; b.) emergence delirium   Diverticulosis    HFrEF (heart failure with reduced ejection fraction) ([place])    a.) TEE 01/08/2020: EF 45-50%; b.) TTE 04/14/2022: EF 50-55%, septal HK, G2DD, GLS /18.3%, mild-mod MR, AoV sclerosis without stenosis.   Hyperlipidemia    Hypertension    Hyponatremia    IBS (irritable bowel syndrome)    Ischemic cardiomyopathy    a. 12/2019 TEE: EF 45-50%. Nl RV fxn. Mild MR.   NSTEMI (non-ST elevated myocardial infarction) ([place]) 01/07/2020   a.) LHC 01/08/2020: EF 45-50%, LVEDP 22 mmHg; 99% o-mLM, 80% OM1, 40% mLAD, 70% o-pRCA, 99% pRCA, 30% dRCA --> consult CVTS; b.) 4v CABG 01/07/2022: LIMA-LAD, SVG-RPDA, SVG-OM1, SVG-OM2.   Postoperative atrial fibrillation ([place]) 01/11/2020   a.) POD3 following 4v CABG; 4 second pause  prior to spontaneous conversion to NSR   S/P CABG x 4 01/08/2020   a.) 4v CABG 01/08/2020: LIMA-LAD, SVG-RPDA, SVG-OM1, SVG-OM2.   Skin cancer    a.) s/p Mohs surgery   Statin intolerance    Past Surgical History:  Procedure Laterality Date   BACK SURGERY     Lumbar   BUNIONECTOMY     CATARACT EXTRACTION, BILATERAL     COLONOSCOPY     CORONARY ARTERY BYPASS GRAFT N/A 01/08/2020   Procedure: CORONARY ARTERY BYPASS GRAFTING (CABG) x 4  WITH ENDOSCOPIC HARVESTING OF  BILATERAL GREATER SAPHENOUS VEINS;  Surgeon: Ivin Poot, MD;  Location: [place];  Service: Open Heart Surgery;  Laterality: N/A;   HEMORRHOID SURGERY  2004   INGUINAL HERNIA REPAIR  01/2010   LEFT HEART CATH AND CORONARY ANGIOGRAPHY N/A 01/08/2020   Procedure: LEFT HEART CATH AND CORONARY ANGIOGRAPHY;  Surgeon: Wellington Hampshire, MD;  Location: [place] [facility];  Service: Cardiovascular;  Laterality: N/A;   ROTATOR CUFF REPAIR Left 06/2010   left/ bone spur Dr.Blackman   SHOULDER ARTHROSCOPY WITH OPEN ROTATOR CUFF REPAIR Right 05/26/2018   Procedure: SHOULDER ARTHROSCOPY WITH OPEN ROTATOR CUFF REPAIR;  Surgeon: Corky Mull, MD;  Location: [facility]S;  Service: Orthopedics;  Laterality: Right;  debridement, decompression   SKIN CANCER EXCISION  2003-2005   Mohs-right shoulder   TEE WITHOUT CARDIOVERSION N/A 01/08/2020   Procedure: TRANSESOPHAGEAL ECHOCARDIOGRAM (TEE);  Surgeon: Prescott Gum, Collier Salina, MD;  Location: [place];  Service: Open Heart Surgery;  Laterality: N/A;   Family History  Problem Relation Age of Onset   Heart attack Father    Heart disease Brother    Cancer Neg Hx    Diabetes Neg Hx    Colon cancer Neg Hx    Esophageal cancer Neg Hx    Stomach cancer Neg Hx    Pancreatic cancer Neg Hx    Social History   Tobacco Use   Smoking status: Never   Smokeless tobacco: Never  Vaping Use   Vaping Use: Never used  Substance Use Topics   Alcohol use: Yes    Alcohol/week: 1.0 standard drink of alcohol    Types: 1 Glasses of wine per week    Comment: rare   Drug use: No    Pertinent Clinical Results:  LABS: Labs reviewed: Acceptable for surgery.  Component Date Value Ref Range Status   MRSA, PCR 07/03/2022 NEGATIVE  NEGATIVE Final   Staphylococcus aureus 07/03/2022 NEGATIVE  NEGATIVE Final   Comment: (NOTE) The Xpert SA Assay (FDA approved for NASAL specimens in patients 32 years of age and older), is one component of a comprehensive surveillance program. It is not  intended to diagnose infection nor to guide or monitor treatment. Performed at [facility], [address] [place], [place] [address]    WBC 07/03/2022 7.1  4.0 - 10.5 K/uL Final   RBC 07/03/2022 4.90  4.22 - 5.81 MIL/uL Final   Hemoglobin 07/03/2022 14.5  13.0 - 17.0 g/dL Final   HCT 07/03/2022 43.2  39.0 - 52.0 % Final   MCV 07/03/2022 88.2  80.0 - 100.0 fL Final   MCH 07/03/2022 29.6  26.0 - 34.0 pg Final   MCHC 07/03/2022 33.6  30.0 - 36.0 g/dL Final   RDW 07/03/2022 12.6  11.5 - 15.5 % Final   Platelets 07/03/2022 240  150 - 400 K/uL Final   nRBC 07/03/2022 0.0  0.0 - 0.2 % Final   Neutrophils Relative %  07/03/2022 63  % Final   Neutro Abs 07/03/2022 4.5  1.7 - 7.7 K/uL Final   Lymphocytes Relative 07/03/2022 24  % Final   Lymphs Abs 07/03/2022 1.7  0.7 - 4.0 K/uL Final   Monocytes Relative 07/03/2022 10  % Final   Monocytes Absolute 07/03/2022 0.7  0.1 - 1.0 K/uL Final   Eosinophils Relative 07/03/2022 2  % Final   Eosinophils Absolute 07/03/2022 0.1  0.0 - 0.5 K/uL Final   Basophils Relative 07/03/2022 1  % Final   Basophils Absolute 07/03/2022 0.1  0.0 - 0.1 K/uL Final   Immature Granulocytes 07/03/2022 0  % Final   Abs Immature Granulocytes 07/03/2022 0.03  0.00 - 0.07 K/uL Final   Performed at [facility], [place], [place] [address]   Sodium 07/03/2022 136  135 - 145 mmol/L Final   Potassium 07/03/2022 3.9  3.5 - 5.1 mmol/L Final   Chloride 07/03/2022 103  98 - 111 mmol/L Final   CO2 07/03/2022 27  22 - 32 mmol/L Final   Glucose, Bld 07/03/2022 96  70 - 99 mg/dL Final   Glucose reference range applies only to samples taken after fasting for at least 8 hours.   BUN 07/03/2022 13  8 - 23 mg/dL Final   Creatinine, Ser 07/03/2022 0.82  0.61 - 1.24 mg/dL Final   Calcium 07/03/2022 9.7  8.9 - 10.3 mg/dL Final   Total Protein 07/03/2022 8.5 (H)  6.5 - 8.1 g/dL Final   Albumin 07/03/2022 4.9  3.5 - 5.0 g/dL Final   AST 07/03/2022 22  15  - 41 U/L Final   ALT 07/03/2022 22  0 - 44 U/L Final   Alkaline Phosphatase 07/03/2022 116  38 - 126 U/L Final   Total Bilirubin 07/03/2022 1.3 (H)  0.3 - 1.2 mg/dL Final   GFR, Estimated 07/03/2022 >60  >60 mL/min Final   Comment: (NOTE) Calculated using the CKD-EPI Creatinine Equation (2021)    Anion gap 07/03/2022 6  5 - 15 Final   Performed at [facility], [place], [place] [address]   Color, Urine 07/03/2022 COLORLESS (A)  YELLOW Final   APPearance 07/03/2022 CLEAR (A)  CLEAR Final   Specific Gravity, Urine 07/03/2022 1.003 (L)  1.005 - 1.030 Final   pH 07/03/2022 8.0  5.0 - 8.0 Final   Glucose, UA 07/03/2022 NEGATIVE  NEGATIVE mg/dL Final   Hgb urine dipstick 07/03/2022 NEGATIVE  NEGATIVE Final   Bilirubin Urine 07/03/2022 NEGATIVE  NEGATIVE Final   Ketones, ur 07/03/2022 NEGATIVE  NEGATIVE mg/dL Final   Protein, ur 07/03/2022 NEGATIVE  NEGATIVE mg/dL Final   Nitrite 07/03/2022 NEGATIVE  NEGATIVE Final   Leukocytes,Ua 07/03/2022 NEGATIVE  NEGATIVE Final   Performed at [facility], [address] [place], [place] [address]   ABO/RH(D) 07/03/2022 O POS   Final   Antibody Screen 07/03/2022 NEG   Final   Sample Expiration 07/03/2022 07/17/2022,2359   Final   Extend sample reason 07/03/2022    Final                   Value:NO TRANSFUSIONS OR PREGNANCY IN THE PAST 3 MONTHS Performed at [facility], [address] [place], [place] [address]     ECG: Date: 04/09/2022 Time ECG obtained: 0846 AM Rate: 49 bpm Rhythm:  Sinus bradycardia with first-degree AV block Axis (leads I and aVF): Normal Intervals: PR 232 ms. QRS 90 ms. QTc 408 ms. ST  segment and T wave changes: No evidence of acute ST segment elevation or depression.  Evidence of an age undetermined septal infarct present. Comparison: Similar to previous tracing obtained on 04/10/2021   IMAGING / PROCEDURES: TRANSTHORACIC ECHOCARDIOGRAM performed on 04/14/2022 Left  ventricular ejection fraction, by estimation, is 50 to 55%. The left ventricle has low normal function. The left ventricle demonstrates regional wall motion abnormalities (Hypokinesis of the septal wall, possibly post surgical etiology). Left  ventricular diastolic parameters are consistent with Grade II diastolic  dysfunction (pseudonormalization). The average left ventricular global  longitudinal strain is -18.3 %.  Right ventricular systolic function is normal. The right ventricular size is normal. There is normal pulmonary artery systolic pressure. The estimated right ventricular systolic pressure is 01.6 mmHg.  The mitral valve is normal in structure. Mild to moderate mitral valve regurgitation. No evidence of mitral stenosis.  The aortic valve is normal in structure. Aortic valve regurgitation is not visualized. Aortic valve sclerosis is present, with no evidence of aortic valve stenosis.  The inferior vena cava is normal in size with greater than 50% respiratory variability, suggesting right atrial pressure of 3 mmHg.   LONG TERM CARDIAC EVENT MONITOR STUDY performed on 02/22/2020 Predominant underlying sinus rhythm Average heart rate 60 bpm, with a range of 43-146 bpm SVT runs occurred with the fastest lasting 5 beats at a maximum rate of 146 bpm No evidence for atrial fibrillation or atrial flutter noted Isolated SVE's and SVE couplets were rare (<1.0%)  ECHO INTRAOPERATIVE TEE performed on 01/08/2020 The left ventricle has mildly reduced systolic function, with an ejection fraction of 45-50%. The cavity size was mildly dilated. There is mildly increased left ventricular wall thickness.  The right ventricle has normal systolic function. The cavity was normal. There is no increase in right ventricular wall thickness.  Left atrial size was normal in size. The left atrial appendage is well visualized and there is no evidence of thrombus present.  Right atrial size was normal in size. Right  atrial pressure is estimated at 10 mmHg.  No atrial level shunt detected by color flow Doppler.  There is no evidence of pericardial effusion.  The mitral valve is normal in structure. No thickening of the mitral valve leaflet. No calcification of the mitral valve leaflet. Mitral valve regurgitation is mild by color flow Doppler. The MR jet is centrally-directed.  The tricuspid valve was normal in structure. Tricuspid valve regurgitation was not visualized by color flow Doppler.  The aortic valve is normal in structure. Aortic valve regurgitation is trivial by color flow Doppler.  The pulmonic valve was not assessed. Pulmonic valve regurgitation was not assessed by color flow Doppler.  The aortic root is normal in size and structure.  No significant changes were noted post bypass  CORONARY ARTERY BYPASS GRAFTING performed on 01/08/2020 4 vessel CABG procedure LIMA-LAD SVG-RPDA SVG-OM1 SVG-OM 2  LEFT HEART CATHETERIZATION AND CORONARY ANGIOGRAPHY performed on 01/08/2020 Mildly reduced left ventricular systolic function with an EF of 45-50% Moderately elevated LVEDP at 22 mmHg.   Multivessel CAD 99% ostial to mid LM 80% OM1 40% mid LAD 70% ostial to proximal RCA 99% proximal RCA 30% distal RCA Recommendations CVTS consultation for emergent CABG. Transfer [name] to [facility]   Impression and Plan:  Saahil Herbster Runkle has been referred for pre-anesthesia review and clearance prior to him undergoing the planned anesthetic and procedural courses. Available labs, pertinent testing, and imaging results were personally reviewed by me. This [name] has been  appropriately cleared by cardiology with an overall ACCEPTABLE risk of significant perioperative cardiovascular complications.  Based on clinical review performed today (07/06/22), barring any significant acute changes in the [name]'s overall condition, it is anticipated that [name] will be able to proceed with the planned surgical  intervention. Any acute changes in clinical condition [place] necessitate his procedure being postponed and/or cancelled. [name] will meet with anesthesia team (MD and/or CRNA) on the day of his procedure for preoperative evaluation/assessment. Questions regarding anesthetic course will be fielded at that time.   Pre-surgical instructions were reviewed with the [name] during his PAT appointment and questions were fielded by PAT clinical staff. [name] was advised that if any questions or concerns arise prior to his procedure then [name] should return a call to PAT and/or his surgeon's office to discuss.  Honor Loh, MSN, APRN, FNP-C, CEN [facility]  Peri-operative Services Nurse Practitioner Phone: [phone] Fax: [phone] 07/06/22 11:22 AM  NOTE: This note has been prepared using Dragon dictation software. Despite my best ability to proofread, there is always the potential that unintentional transcriptional errors [place] still occur from this process.

## 2022-07-07 LAB — TYPE AND SCREEN
ABO/RH(D): O POS
Antibody Screen: NEGATIVE

## 2022-07-14 ENCOUNTER — Observation Stay
Admission: RE | Admit: 2022-07-14 | Discharge: 2022-07-17 | Disposition: A | Discharge status: Home Health | Payer: Medicare PPO | Attending: Orthopedic Surgery | Admitting: Orthopedic Surgery

## 2022-07-14 ENCOUNTER — Other Ambulatory Visit: Payer: Self-pay

## 2022-07-14 ENCOUNTER — Encounter
Admission: RE | Disposition: A | Discharge status: Home Health | Payer: Self-pay | Source: Home / Self Care | Attending: Orthopedic Surgery

## 2022-07-14 ENCOUNTER — Ambulatory Visit: Payer: Medicare PPO | Admitting: Urgent Care

## 2022-07-14 ENCOUNTER — Encounter: Payer: Self-pay | Admitting: Orthopedic Surgery

## 2022-07-14 ENCOUNTER — Ambulatory Visit: Payer: Medicare PPO

## 2022-07-14 ENCOUNTER — Observation Stay: Payer: Medicare PPO

## 2022-07-14 DIAGNOSIS — I2510 Atherosclerotic heart disease of native coronary artery without angina pectoris: Secondary | ICD-10-CM | POA: Insufficient documentation

## 2022-07-14 DIAGNOSIS — Z951 Presence of aortocoronary bypass graft: Secondary | ICD-10-CM | POA: Insufficient documentation

## 2022-07-14 DIAGNOSIS — Z85828 Personal history of other malignant neoplasm of skin: Secondary | ICD-10-CM | POA: Insufficient documentation

## 2022-07-14 DIAGNOSIS — I252 Old myocardial infarction: Secondary | ICD-10-CM | POA: Insufficient documentation

## 2022-07-14 DIAGNOSIS — I110 Hypertensive heart disease with heart failure: Secondary | ICD-10-CM | POA: Insufficient documentation

## 2022-07-14 DIAGNOSIS — Z91010 Allergy to peanuts: Secondary | ICD-10-CM | POA: Diagnosis not present

## 2022-07-14 DIAGNOSIS — Z96641 Presence of right artificial hip joint: Secondary | ICD-10-CM

## 2022-07-14 DIAGNOSIS — Z7982 Long term (current) use of aspirin: Secondary | ICD-10-CM | POA: Diagnosis not present

## 2022-07-14 DIAGNOSIS — I5020 Unspecified systolic (congestive) heart failure: Secondary | ICD-10-CM | POA: Diagnosis not present

## 2022-07-14 DIAGNOSIS — M1611 Unilateral primary osteoarthritis, right hip: Secondary | ICD-10-CM | POA: Diagnosis present

## 2022-07-14 DIAGNOSIS — Z79899 Other long term (current) drug therapy: Secondary | ICD-10-CM | POA: Diagnosis not present

## 2022-07-14 HISTORY — DX: Unspecified malignant neoplasm of skin, unspecified: C44.90

## 2022-07-14 HISTORY — PX: TOTAL HIP ARTHROPLASTY: SHX124

## 2022-07-14 HISTORY — DX: Other specified health status: Z78.9

## 2022-07-14 HISTORY — DX: Unspecified systolic (congestive) heart failure: I50.20

## 2022-07-14 LAB — CBC
HCT: 38.8 % — ABNORMAL LOW (ref 39.0–52.0)
Hemoglobin: 13.1 g/dL (ref 13.0–17.0)
MCH: 30.2 pg (ref 26.0–34.0)
MCHC: 33.8 g/dL (ref 30.0–36.0)
MCV: 89.4 fL (ref 80.0–100.0)
Platelets: 257 10*3/uL (ref 150–400)
RBC: 4.34 MIL/uL (ref 4.22–5.81)
RDW: 12.3 % (ref 11.5–15.5)
WBC: 14.3 10*3/uL — ABNORMAL HIGH (ref 4.0–10.5)
nRBC: 0 % (ref 0.0–0.2)

## 2022-07-14 LAB — CREATININE, SERUM
Creatinine, Ser: 0.75 mg/dL (ref 0.61–1.24)
GFR, Estimated: >60 mL/min (ref >60)

## 2022-07-14 SURGERY — ARTHROPLASTY, HIP, TOTAL, ANTERIOR APPROACH
Anesthesia: General | Site: Hip | Laterality: Right

## 2022-07-14 MED ORDER — LACTATED RINGERS IV SOLN: INTRAVENOUS | Status: DC

## 2022-07-14 MED ORDER — 0.9 % SODIUM CHLORIDE (POUR BTL) OPTIME
TOPICAL | Status: DC | PRN
  Administered 2022-07-14: 1000 mL

## 2022-07-14 MED ORDER — METOCLOPRAMIDE HCL 5 MG/ML IJ SOLN: 5.0000 mg | Freq: Three times a day (TID) | INTRAMUSCULAR | Status: DC | PRN

## 2022-07-14 MED ORDER — MENTHOL 3 MG MT LOZG
1.0000 | LOZENGE | OROMUCOSAL | Status: DC | PRN
Start: 2022-07-14 — End: 2022-07-17
  Administered 2022-07-14: 3 mg via ORAL
  Filled 2022-07-14: qty 9

## 2022-07-14 MED ORDER — ASPIRIN 81 MG PO TBEC
81.0000 mg | DELAYED_RELEASE_TABLET | Freq: Every day | ORAL | Status: DC
  Administered 2022-07-14 – 2022-07-17 (×4): 81 mg via ORAL
  Filled 2022-07-14 (×4): qty 1

## 2022-07-14 MED ORDER — METHOCARBAMOL 500 MG PO TABS
500.0000 mg | ORAL_TABLET | Freq: Four times a day (QID) | ORAL | Status: DC | PRN
  Administered 2022-07-14 – 2022-07-15 (×3): 500 mg via ORAL
  Filled 2022-07-14 (×3): qty 1

## 2022-07-14 MED ORDER — CHLORHEXIDINE GLUCONATE 0.12 % MT SOLN
OROMUCOSAL | Status: AC
  Administered 2022-07-14: 15 mL via OROMUCOSAL
  Filled 2022-07-14: qty 15

## 2022-07-14 MED ORDER — METOCLOPRAMIDE HCL 5 MG PO TABS: 5.0000 mg | ORAL_TABLET | Freq: Three times a day (TID) | ORAL | Status: DC | PRN

## 2022-07-14 MED ORDER — AMLODIPINE BESYLATE 10 MG PO TABS
10.0000 mg | ORAL_TABLET | Freq: Every morning | ORAL | Status: DC
  Administered 2022-07-16 – 2022-07-17 (×2): 10 mg via ORAL
  Filled 2022-07-14 (×3): qty 1

## 2022-07-14 MED ORDER — ENOXAPARIN SODIUM 40 MG/0.4ML IJ SOSY
40.0000 mg | PREFILLED_SYRINGE | INTRAMUSCULAR | Status: DC
  Administered 2022-07-15 – 2022-07-17 (×3): 40 mg via SUBCUTANEOUS
  Filled 2022-07-14 (×3): qty 0.4

## 2022-07-14 MED ORDER — SODIUM CHLORIDE 0.9 % IV SOLN
INTRAVENOUS | Status: DC
Start: 2022-07-14 — End: 2022-07-17

## 2022-07-14 MED ORDER — CALCIUM CARBONATE 1250 (500 CA) MG PO TABS
1.0000 | ORAL_TABLET | Freq: Every day | ORAL | Status: DC
  Administered 2022-07-15 – 2022-07-17 (×3): 1250 mg via ORAL
  Filled 2022-07-14 (×3): qty 1

## 2022-07-14 MED ORDER — ZOLPIDEM TARTRATE 5 MG PO TABS: 5.0000 mg | ORAL_TABLET | Freq: Every evening | ORAL | Status: DC | PRN

## 2022-07-14 MED ORDER — PHENOL 1.4 % MT LIQD: 1.0000 | OROMUCOSAL | Status: DC | PRN

## 2022-07-14 MED ORDER — PSYLLIUM 95 % PO PACK
1.0000 | PACK | Freq: Every day | ORAL | Status: DC
  Administered 2022-07-15 – 2022-07-16 (×2): 1 via ORAL
  Filled 2022-07-14 (×3): qty 1

## 2022-07-14 MED ORDER — OXYCODONE HCL 5 MG PO TABS: 5.0000 mg | ORAL_TABLET | Freq: Once | ORAL | Status: DC | PRN

## 2022-07-14 MED ORDER — SURGIRINSE WOUND IRRIGATION SYSTEM - OPTIME: TOPICAL | Status: DC | PRN

## 2022-07-14 MED ORDER — ACETAMINOPHEN 10 MG/ML IV SOLN
INTRAVENOUS | Status: AC
  Filled 2022-07-14: qty 100

## 2022-07-14 MED ORDER — ONDANSETRON HCL 4 MG PO TABS
4.0000 mg | ORAL_TABLET | Freq: Four times a day (QID) | ORAL | Status: DC | PRN
  Administered 2022-07-14 – 2022-07-15 (×2): 4 mg via ORAL
  Filled 2022-07-14 (×2): qty 1

## 2022-07-14 MED ORDER — PROPOFOL 500 MG/50ML IV EMUL
INTRAVENOUS | Status: DC | PRN
  Administered 2022-07-14: 200 ug/kg/min via INTRAVENOUS

## 2022-07-14 MED ORDER — CEFAZOLIN SODIUM-DEXTROSE 2-4 GM/100ML-% IV SOLN
2.0000 g | INTRAVENOUS | Status: AC
  Administered 2022-07-14: 2 g via INTRAVENOUS

## 2022-07-14 MED ORDER — ACETAMINOPHEN 325 MG PO TABS
325.0000 mg | ORAL_TABLET | Freq: Four times a day (QID) | ORAL | Status: DC | PRN
  Administered 2022-07-15: 650 mg via ORAL
  Filled 2022-07-14: qty 2

## 2022-07-14 MED ORDER — ACETAMINOPHEN 10 MG/ML IV SOLN
INTRAVENOUS | Status: DC | PRN
  Administered 2022-07-14: 1000 mg via INTRAVENOUS

## 2022-07-14 MED ORDER — BISACODYL 5 MG PO TBEC
5.0000 mg | DELAYED_RELEASE_TABLET | Freq: Every day | ORAL | Status: DC | PRN
  Administered 2022-07-15: 5 mg via ORAL
  Filled 2022-07-14: qty 1

## 2022-07-14 MED ORDER — MORPHINE SULFATE (PF) 2 MG/ML IV SOLN
0.5000 mg | INTRAVENOUS | Status: DC | PRN
  Administered 2022-07-14: 1 mg via INTRAVENOUS
  Filled 2022-07-14: qty 1

## 2022-07-14 MED ORDER — HYDROCODONE-ACETAMINOPHEN 5-325 MG PO TABS: 1.0000 | ORAL_TABLET | ORAL | Status: DC | PRN

## 2022-07-14 MED ORDER — TRAMADOL HCL 50 MG PO TABS
50.0000 mg | ORAL_TABLET | Freq: Four times a day (QID) | ORAL | Status: DC
  Administered 2022-07-14 – 2022-07-17 (×8): 50 mg via ORAL
  Filled 2022-07-14 (×9): qty 1

## 2022-07-14 MED ORDER — METOPROLOL SUCCINATE ER 25 MG PO TB24
12.5000 mg | ORAL_TABLET | Freq: Every day | ORAL | Status: DC
  Administered 2022-07-15 – 2022-07-17 (×3): 12.5 mg via ORAL
  Filled 2022-07-14 (×3): qty 1

## 2022-07-14 MED ORDER — SENNOSIDES-DOCUSATE SODIUM 8.6-50 MG PO TABS
1.0000 | ORAL_TABLET | Freq: Every evening | ORAL | Status: DC | PRN
  Administered 2022-07-15 – 2022-07-16 (×2): 1 via ORAL
  Filled 2022-07-14 (×2): qty 1

## 2022-07-14 MED ORDER — DIPHENHYDRAMINE HCL 12.5 MG/5ML PO ELIX: 12.5000 mg | ORAL_SOLUTION | ORAL | Status: DC | PRN

## 2022-07-14 MED ORDER — FAMOTIDINE 20 MG PO TABS
ORAL_TABLET | ORAL | Status: AC
  Administered 2022-07-14: 20 mg via ORAL
  Filled 2022-07-14: qty 1

## 2022-07-14 MED ORDER — METHOCARBAMOL 1000 MG/10ML IJ SOLN: 500.0000 mg | Freq: Four times a day (QID) | INTRAVENOUS | Status: DC | PRN

## 2022-07-14 MED ORDER — ONDANSETRON HCL 4 MG/2ML IJ SOLN
4.0000 mg | Freq: Once | INTRAMUSCULAR | Status: DC | PRN
Start: 2022-07-14 — End: 2022-07-14

## 2022-07-14 MED ORDER — ONDANSETRON HCL 4 MG/2ML IJ SOLN
4.0000 mg | Freq: Four times a day (QID) | INTRAMUSCULAR | Status: DC | PRN
  Administered 2022-07-15: 4 mg via INTRAVENOUS
  Filled 2022-07-14: qty 2

## 2022-07-14 MED ORDER — ALUM & MAG HYDROXIDE-SIMETH 200-200-20 MG/5ML PO SUSP
30.0000 mL | ORAL | Status: DC | PRN
  Administered 2022-07-15: 30 mL via ORAL
  Filled 2022-07-14: qty 30

## 2022-07-14 MED ORDER — PROPOFOL 10 MG/ML IV BOLUS
INTRAVENOUS | Status: DC | PRN
  Administered 2022-07-14: 30 mg via INTRAVENOUS
  Administered 2022-07-14: 20 mg via INTRAVENOUS

## 2022-07-14 MED ORDER — CEFAZOLIN SODIUM-DEXTROSE 1-4 GM/50ML-% IV SOLN
1.0000 g | Freq: Four times a day (QID) | INTRAVENOUS | Status: AC
  Administered 2022-07-14 (×2): 1 g via INTRAVENOUS
  Filled 2022-07-14 (×2): qty 50

## 2022-07-14 MED ORDER — FENTANYL CITRATE (PF) 100 MCG/2ML IJ SOLN: 25.0000 ug | INTRAMUSCULAR | Status: DC | PRN

## 2022-07-14 MED ORDER — MAGNESIUM OXIDE -MG SUPPLEMENT 400 (240 MG) MG PO TABS
400.0000 mg | ORAL_TABLET | Freq: Every day | ORAL | Status: DC
  Administered 2022-07-14 – 2022-07-17 (×4): 400 mg via ORAL
  Filled 2022-07-14 (×4): qty 1

## 2022-07-14 MED ORDER — PANTOPRAZOLE SODIUM 40 MG PO TBEC
40.0000 mg | DELAYED_RELEASE_TABLET | Freq: Every day | ORAL | Status: DC
  Administered 2022-07-14 – 2022-07-17 (×4): 40 mg via ORAL
  Filled 2022-07-14 (×4): qty 1

## 2022-07-14 MED ORDER — SODIUM CHLORIDE 0.9 % IV SOLN
INTRAVENOUS | Status: DC | PRN
  Administered 2022-07-14: 60 mL

## 2022-07-14 MED ORDER — BUPIVACAINE-EPINEPHRINE (PF) 0.25% -1:200000 IJ SOLN
INTRAMUSCULAR | Status: AC
  Filled 2022-07-14: qty 30

## 2022-07-14 MED ORDER — FLEET ENEMA 7-19 GM/118ML RE ENEM: 1.0000 | ENEMA | Freq: Once | RECTAL | Status: DC | PRN

## 2022-07-14 MED ORDER — GLYCOPYRROLATE 0.2 MG/ML IJ SOLN
INTRAMUSCULAR | Status: DC | PRN
  Administered 2022-07-14: .2 mg via INTRAVENOUS

## 2022-07-14 MED ORDER — OXYCODONE HCL 5 MG/5ML PO SOLN: 5.0000 mg | Freq: Once | ORAL | Status: DC | PRN

## 2022-07-14 MED ORDER — PHENYLEPHRINE HCL-NACL 20-0.9 MG/250ML-% IV SOLN
INTRAVENOUS | Status: DC | PRN
  Administered 2022-07-14: 50 ug/min via INTRAVENOUS

## 2022-07-14 MED ORDER — BUPIVACAINE LIPOSOME 1.3 % IJ SUSP
INTRAMUSCULAR | Status: AC
  Filled 2022-07-14: qty 20

## 2022-07-14 MED ORDER — SODIUM CHLORIDE 0.9% FLUSH
INTRAVENOUS | Status: DC | PRN
  Administered 2022-07-14: 40 mL

## 2022-07-14 MED ORDER — BUPIVACAINE-EPINEPHRINE 0.25% -1:200000 IJ SOLN
INTRAMUSCULAR | Status: DC | PRN
  Administered 2022-07-14: 30 mL

## 2022-07-14 MED ORDER — ACETAMINOPHEN 10 MG/ML IV SOLN: 1000.0000 mg | Freq: Once | INTRAVENOUS | Status: DC | PRN

## 2022-07-14 MED ORDER — OMEGA-3-ACID ETHYL ESTERS 1 G PO CAPS
1.0000 g | ORAL_CAPSULE | Freq: Every day | ORAL | Status: DC
  Administered 2022-07-14 – 2022-07-17 (×4): 1 g via ORAL
  Filled 2022-07-14 (×6): qty 1

## 2022-07-14 MED ORDER — ONDANSETRON HCL 4 MG/2ML IJ SOLN
INTRAMUSCULAR | Status: DC | PRN
  Administered 2022-07-14: 4 mg via INTRAVENOUS

## 2022-07-14 MED ORDER — FENTANYL CITRATE (PF) 100 MCG/2ML IJ SOLN
INTRAMUSCULAR | Status: AC
  Filled 2022-07-14: qty 2

## 2022-07-14 MED ORDER — FAMOTIDINE 20 MG PO TABS: 20.0000 mg | ORAL_TABLET | Freq: Once | ORAL | Status: AC

## 2022-07-14 MED ORDER — CEFAZOLIN SODIUM-DEXTROSE 2-4 GM/100ML-% IV SOLN
INTRAVENOUS | Status: AC
  Filled 2022-07-14: qty 100

## 2022-07-14 MED ORDER — HYDROCODONE-ACETAMINOPHEN 7.5-325 MG PO TABS
1.0000 | ORAL_TABLET | ORAL | Status: DC | PRN
  Administered 2022-07-14 – 2022-07-15 (×4): 2 via ORAL
  Filled 2022-07-14 (×4): qty 2

## 2022-07-14 MED ORDER — SODIUM CHLORIDE FLUSH 0.9 % IV SOLN
INTRAVENOUS | Status: AC
  Filled 2022-07-14: qty 40

## 2022-07-14 MED ORDER — CHLORHEXIDINE GLUCONATE 0.12 % MT SOLN: 15.0000 mL | Freq: Once | OROMUCOSAL | Status: AC

## 2022-07-14 MED ORDER — ORAL CARE MOUTH RINSE: 15.0000 mL | Freq: Once | OROMUCOSAL | Status: AC

## 2022-07-14 MED ORDER — DOCUSATE SODIUM 100 MG PO CAPS
100.0000 mg | ORAL_CAPSULE | Freq: Two times a day (BID) | ORAL | Status: DC
  Administered 2022-07-14 – 2022-07-17 (×7): 100 mg via ORAL
  Filled 2022-07-14 (×7): qty 1

## 2022-07-14 SURGICAL SUPPLY — 60 items
APL PRP STRL LF DISP 70% ISPRP (MISCELLANEOUS) ×1
BLADE SAGITTAL AGGR TOOTH XLG (BLADE) ×3 IMPLANT
BLADE SAW 18WX90L 1.27 THK (BLADE) ×1 IMPLANT
BNDG CMPR 5X6 CHSV STRCH STRL (GAUZE/BANDAGES/DRESSINGS) ×3
BNDG COHESIVE 6X5 TAN ST LF (GAUZE/BANDAGES/DRESSINGS) ×9 IMPLANT
BOWL CEMENT MIXING ADV NOZZLE (MISCELLANEOUS) ×1 IMPLANT
CANISTER WOUND CARE 500ML ATS (WOUND CARE) ×3 IMPLANT
CEMENT RESTRICTOR DEPUY SZ 3 (Cement) IMPLANT
CHLORAPREP W/TINT 26 (MISCELLANEOUS) ×3 IMPLANT
COVER BACK TABLE REUSABLE LG (DRAPES) ×3 IMPLANT
DRAPE 3/4 80X56 (DRAPES) ×9 IMPLANT
DRAPE C-ARM XRAY 36X54 (DRAPES) ×3 IMPLANT
DRAPE INCISE IOBAN 66X60 STRL (DRAPES) IMPLANT
DRAPE POUCH INSTRU U-SHP 10X18 (DRAPES) ×3 IMPLANT
DRESSING SURGICEL FIBRLLR 1X2 (HEMOSTASIS) ×4 IMPLANT
DRSG MEPILEX SACRM 8.7X9.8 (GAUZE/BANDAGES/DRESSINGS) ×3 IMPLANT
DRSG SURGICEL FIBRILLAR 1X2 (HEMOSTASIS) ×4
ELECT BLADE 6.5 EXT (BLADE) ×3 IMPLANT
ELECT REM PT RETURN 9FT ADLT (ELECTROSURGICAL) ×2
ELECTRODE REM PT RTRN 9FT ADLT (ELECTROSURGICAL) ×2 IMPLANT
GLOVE BIOGEL PI IND STRL 9 (GLOVE) ×2 IMPLANT
GLOVE BIOGEL PI INDICATOR 9 (GLOVE) ×1
GLOVE SURG SYN 9.0  PF PI (GLOVE) ×4
GLOVE SURG SYN 9.0 PF PI (GLOVE) ×4 IMPLANT
GOWN SRG 2XL LVL 4 RGLN SLV (GOWNS) ×2 IMPLANT
GOWN STRL NON-REIN 2XL LVL4 (GOWNS) ×2
GOWN STRL REUS W/ TWL LRG LVL3 (GOWN DISPOSABLE) ×2 IMPLANT
GOWN STRL REUS W/TWL LRG LVL3 (GOWN DISPOSABLE) ×2
HIP DBL LINER 54X28 (Liner) ×1 IMPLANT
HIP FEM HD S 28 (Head) ×1 IMPLANT
HIP STEM FEM 2 STD (Stem) ×1 IMPLANT
HOLDER FOLEY CATH W/STRAP (MISCELLANEOUS) ×3 IMPLANT
HOOD PEEL AWAY FLYTE STAYCOOL (MISCELLANEOUS) ×3 IMPLANT
KIT PREP HIP W/CEMENT RESTRICT (Miscellaneous) IMPLANT
KIT PREPARATION TOTAL HIP (Miscellaneous) ×2 IMPLANT
KIT PREVENA INCISION MGT 13 (CANNISTER) ×3 IMPLANT
MANIFOLD NEPTUNE II (INSTRUMENTS) ×3 IMPLANT
MAT ABSORB  FLUID 56X50 GRAY (MISCELLANEOUS) ×2
MAT ABSORB FLUID 56X50 GRAY (MISCELLANEOUS) ×2 IMPLANT
NDL SPNL 20GX3.5 QUINCKE YW (NEEDLE) ×4 IMPLANT
NEEDLE SPNL 20GX3.5 QUINCKE YW (NEEDLE) ×4 IMPLANT
NS IRRIG 1000ML POUR BTL (IV SOLUTION) ×3 IMPLANT
PACK HIP COMPR (MISCELLANEOUS) ×3 IMPLANT
SCALPEL PROTECTED #10 DISP (BLADE) ×6 IMPLANT
SHELL ACETABULAR SZ 54 DM (Shell) ×1 IMPLANT
SOLUTION IRRIG SURGIPHOR (IV SOLUTION) ×3 IMPLANT
STAPLER SKIN PROX 35W (STAPLE) ×3 IMPLANT
STRAP SAFETY 5IN WIDE (MISCELLANEOUS) ×3 IMPLANT
SUT DVC 2 QUILL PDO  T11 36X36 (SUTURE) ×2
SUT DVC 2 QUILL PDO T11 36X36 (SUTURE) ×2 IMPLANT
SUT SILK 0 (SUTURE) ×2
SUT SILK 0 30XBRD TIE 6 (SUTURE) ×2 IMPLANT
SUT V-LOC 90 ABS DVC 3-0 CL (SUTURE) ×3 IMPLANT
SUT VIC AB 1 CT1 36 (SUTURE) ×3 IMPLANT
SYR 50ML LL SCALE MARK (SYRINGE) ×6 IMPLANT
SYR BULB IRRIG 60ML STRL (SYRINGE) ×3 IMPLANT
TAPE MICROFOAM 4IN (TAPE) IMPLANT
TOWEL OR 17X26 4PK STRL BLUE (TOWEL DISPOSABLE) IMPLANT
TRAY FOLEY MTR SLVR 16FR STAT (SET/KITS/TRAYS/PACK) ×3 IMPLANT
WATER STERILE IRR 1000ML POUR (IV SOLUTION) ×3 IMPLANT

## 2022-07-14 NOTE — Anesthesia Postprocedure Evaluation (Signed)
Anesthesia Post Note  Patient: [name]  Procedure(s) Performed: TOTAL HIP ARTHROPLASTY ANTERIOR APPROACH (Right: Hip)  Patient location during evaluation: PACU Anesthesia Type: Spinal Level of consciousness: awake and alert, oriented and patient cooperative Pain management: pain level controlled Vital Signs Assessment: post-procedure vital signs reviewed and stable Respiratory status: spontaneous breathing, nonlabored ventilation and respiratory function stable Cardiovascular status: blood pressure returned to baseline and stable Postop Assessment: adequate PO intake, no headache and spinal receding Anesthetic complications: no   No notable events documented.   Last Vitals:  Vitals:   07/14/22 1330 07/14/22 1345  BP: (!) 127/99 129/64  Pulse: (!) 55 (!) 52  Resp: 20 (!) 24  Temp:    SpO2: 100% 100%    Last Pain:  Vitals:   07/14/22 1345  TempSrc:   PainSc: 0-No pain                 Darrin Nipper

## 2022-07-14 NOTE — Progress Notes (Signed)
PT Cancellation Note  Patient Details Name: [name] MRN: [id number] DOB: 1941/11/29   Cancelled Treatment:    Reason Eval/Treat Not Completed: Other (comment).  Chart reviewed and attempted to see pt.  Pt attempted to mobilize the LE's but notes he was unable to feel therapist touching distal extremities.  Pt reports he might be feeling a little more this afternoon if therapist can come back later.  Will re-attempt as medically appropriate at later date/time.   Gwenlyn Saran, PT, DPT 07/14/22, 3:24 PM

## 2022-07-14 NOTE — Anesthesia Procedure Notes (Signed)
Spinal  Patient location during procedure: OR Start time: 07/14/2022 10:12 AM End time: 07/14/2022 10:19 AM Reason for block: surgical anesthesia Staffing Performed: resident/CRNA  Resident/CRNA: Noles, Mark, CRNA Performed by: Noles, Mark, CRNA Authorized by: Mazzoni, Andrea, MD   Preanesthetic Checklist Completed: patient identified, IV checked, site marked, risks and benefits discussed, surgical consent, monitors and equipment checked, pre-op evaluation and timeout performed Spinal Block Patient position: sitting Prep: ChloraPrep Patient monitoring: heart rate, continuous pulse ox, blood pressure and cardiac monitor Approach: midline Location: L3-4 Injection technique: single-shot Needle Needle type: Whitacre and Introducer  Needle gauge: 25 G Needle length: 9 cm Assessment Sensory level: T10 Events: CSF return Additional Notes Sterile aseptic technique used throughout the procedure.  Negative paresthesia. Negative blood return. Positive free-flowing CSF. Expiration date of kit checked and confirmed. Patient tolerated procedure well, without complications.       

## 2022-07-14 NOTE — Plan of Care (Signed)

## 2022-07-14 NOTE — Anesthesia Procedure Notes (Signed)
Date/Time: 07/14/2022 10:15 AM  Performed by: Nelda Marseille, CRNAPre-anesthesia Checklist: Patient identified, Emergency Drugs available, Suction available, Patient being monitored and Timeout performed Oxygen Delivery Method: Simple face mask

## 2022-07-14 NOTE — TOC Progression Note (Signed)
Transition of Care [facility]) - Progression Note    Patient Details  Name: Yossi Hinchman [place] MRN: [id number] Date of Birth: 1941-10-16  Transition of Care [facility]) CM/SW [place], RN Phone Number: 07/14/2022, 10:07 AM  Clinical Narrative:     Alvis Lemmings accepted the patient for [facility]       Expected Discharge Plan and Services                                                 Social Determinants of Health (SDOH) Interventions    Readmission Risk Interventions     No data to display

## 2022-07-14 NOTE — Transfer of Care (Signed)
Immediate Anesthesia Transfer of Care Note  Patient: [name]  Procedure(s) Performed: TOTAL HIP ARTHROPLASTY ANTERIOR APPROACH (Right: Hip)  Patient Location: PACU  Anesthesia Type:Spinal  Level of Consciousness: awake, alert  and oriented  Airway & Oxygen Therapy: Patient Spontanous Breathing and Patient connected to face mask oxygen  Post-op Assessment: Report given to RN and Post -op Vital signs reviewed and stable  Post vital signs: Reviewed and stable  Last Vitals:  Vitals Value Taken Time  BP    Temp    Pulse    Resp    SpO2      Last Pain:  Vitals:   07/14/22 0832  TempSrc: Temporal  PainSc: 0-No pain         Complications: No notable events documented.

## 2022-07-14 NOTE — H&P (Signed)
Chief Complaint  Patient presents with  Right Hip - Follow-up    History of the Present Illness: [name] is a 81 y.o. male here today for evaluation of right hip pain and osteoarthritis. He had a right hip x-ray performed on 04/11/2022 showing bone on bone, complete loss of superior joint space, subchondral cyst formation on both sides of the joint. 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 The patient is accompanied by a male companion.  The patient states he is doing better today than he was yesterday and better than he was over the weekend. His right hip has been bothering him. The patient has intermittent swelling in his legs. He has difficulty in his activities of daily living.  His heart is healthy. His cardiologist, Dr. Melburn Hake examined him and believes he is a candidate for surgery. He performed EKG and informed the patient that he would soon undergo surgery. His medications were reviewed. He had a CABG in 12/2019. 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  He is going on a trip to [place] and will be returning on 07/01/2022.   I have reviewed past medical, surgical, social and family history, and allergies as documented in the EMR.  Past Medical History: Past Medical History:  Diagnosis Date  Hyperlipemia 03/03/2007  Overview: Qualifier: Diagnosis of By: Silvio Pate MD, [facility] Last Assessment & Plan: Discussed primary prevention---he doesn't want statin  Hypertension  Irritable bowel syndrome 07/11/2010  Overview: Qualifier: Diagnosis of By: Silvio Pate MD, [facility] Last Assessment & Plan: Uses the med intermittently No major issues Had FIT by insurance--didn't keep record but was negative  Osteoarthritis 03/03/2007  Overview: Qualifier: Diagnosis of By: Silvio Pate MD, [facility]   Past Surgical History: Past Surgical  History:  Procedure Laterality Date  Limited arthroscopic debridement,arthroscopic subscapularis tendon repair,arthroscopic subacromial decompression,and mini-open rotator cuff repair with smith & Nephew regeneten patch, right shoulder Right 05/26/2018  Dr.Poggi  back surgery  shoulder surgery   Past Family History: History reviewed. No pertinent family history.  Medications: Current Outpatient Medications Ordered in Epic  Medication Sig Dispense Refill  acetaminophen (TYLENOL) 325 MG tablet Take 2 tablets by mouth as needed  amLODIPine (NORVASC) 5 MG tablet TAKE 1/2 TABLET BY MOUTH DAILY AS DIRECTED  aspirin 81 MG EC tablet Take 81 mg by mouth once daily  calcium carbonate (TUMS ULTRA) 400 mg calcium (1,000 mg) chewable tablet Take 400 mg of elemental by mouth once daily  evolocumab (REPATHA SURECLICK) 353 mg/mL PnIj Inject 1 pen subcutaneously every 14 (fourteen) days  hyoscyamine (LEVSIN/SL) 0.125 mg SL tablet Place 1 tablet under the tongue 2 (two) times daily as needed  MAGNESIUM ORAL Take 500 mg by mouth once daily  meloxicam (MOBIC) 7.5 MG tablet Take 1 tablet (7.5 mg total) by mouth once daily 30 tablet 2  metoprolol succinate (TOPROL-XL) 25 MG XL tablet Take 25 mg by mouth once daily  omega-3 fatty acids/fish oil 340-1,000 mg capsule Take 1 capsule by mouth once daily   No current Epic-ordered facility-administered medications on file.   Allergies: Allergies  Allergen Reactions  Peanut Other (See Comments)  Penicillins Rash    Body mass index is 21.26 kg/m.  Review of Systems: A comprehensive 14 point ROS was performed, reviewed, and the pertinent orthopaedic findings are documented in the HPI.  Vitals:  06/17/22 0804  BP:  128/72    General Physical Examination:  General/Constitutional: No apparent distress: well-nourished and well developed. Eyes: Pupils equal, round with synchronous movement. Lungs: Clear to auscultation HEENT: Normal Vascular: No edema,  swelling or tenderness, except as noted in detailed exam. Cardiac: Heart rate and rhythm is regular. Integumentary: No impressive skin lesions present, except as noted in detailed exam. Neuro/Psych: Normal mood and affect, oriented to person, place, and time.  On exam, right hip has 0 degrees of internal rotation and 20 degrees of external rotation. He has trace edema in his legs.  Assessment: ICD-10-CM  1. 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   I advised him to follow up with his cardiologist post echo. We are going to continue the patient on aspirin, Repatha, and Toprol. His ejection fraction has been improving. I recommended cemented right total hip arthroplasty.  Surgical Risks:  The nature of the condition and the proposed procedure has been reviewed in detail with the patient. Surgical versus non-surgical options and prognosis for recovery have been reviewed and the inherent risks and benefits of each have been discussed including the risks of infection, bleeding, injury to nerves/blood vessels/tendons, incomplete relief of symptoms, persisting pain and/or stiffness, loss of function, complex regional pain syndrome, failure of the procedure, as appropriate.  Document Attestation: I, T. Tamilselvi, have reviewed and updated documentation for [facility], MD, utilizing [employer] DAX.    Electronically signed by Lauris Poag, MD at 06/17/2022 1:17 PM EDT  Reviewed  H+P. No changes noted.

## 2022-07-14 NOTE — Anesthesia Preprocedure Evaluation (Signed)
Anesthesia Evaluation  Patient identified by MRN, date of birth, ID band Patient awake    Reviewed: Allergy & Precautions, NPO status , Patient's Chart, lab work & pertinent test results  History of Anesthesia Complications Negative for: history of anesthetic complications  Airway Mallampati: IV   Neck ROM: Full    Dental  (+) Missing   Pulmonary neg pulmonary ROS,    Pulmonary exam normal breath sounds clear to auscultation       Cardiovascular hypertension, + CAD (s/p MI and 4V CABG 2021) and +CHF (EF 45-50%)  Normal cardiovascular exam Rhythm:Regular Rate:Normal  Echo 04/14/22:  1. Left ventricular ejection fraction, by estimation, is 50 to 55%. The left ventricle has low normal function. The left ventricle demonstrates regional wall motion abnormalities (Hypokinesis of the septal wall, possibly post surgical etiology). Left  ventricular diastolic parameters are consistent with Grade II diastolic  dysfunction (pseudonormalization). The average left ventricular global  longitudinal strain is -18.3 %.  2. Right ventricular systolic function is normal. The right ventricular size is normal. There is normal pulmonary artery systolic pressure. The estimated right ventricular systolic pressure is 78.4 mmHg.  3. The mitral valve is normal in structure. Mild to moderate mitral valve regurgitation. No evidence of mitral stenosis.  4. The aortic valve is normal in structure. Aortic valve regurgitation is not visualized. Aortic valve sclerosis is present, with no evidence of aortic valve stenosis.  5. The inferior vena cava is normal in size with greater than 50% respiratory variability, suggesting right atrial pressure of 3 mmHg.    Neuro/Psych  Neuromuscular disease (bilateral foot numbness)    GI/Hepatic negative GI ROS,   Endo/Other  negative endocrine ROS  Renal/GU negative Renal ROS     Musculoskeletal  (+) Arthritis ,    Abdominal   Peds  Hematology negative hematology ROS (+)   Anesthesia Other Findings Reviewed and agree with Bayard Males pre-anesthesia clinical review note.   Cardiology note 05/01/22:  1. Preop evaluation, right hip surgery being planned.  Denies chest pain, echo with preserved ejection fraction.  Okay to proceed with surgical procedure from a cardiac perspective. 2. S/p CABG x 4. denies chest pain .continue aspirin, Repatha, Toprol-XL 12.5 mg daily.  Echo with improved EF, normal EF 50 to 55% . 3. hyperlipidemia, CAD.  Not tolerant to statins, LDL at goal .  Continue Repatha. 4. Hypertension, BP controlled.  Cont Toprol-XL 12.5 mg daily, Norvasc to 10 mg daily.    Follow-up in 6 months.  Reproductive/Obstetrics                             Anesthesia Physical Anesthesia Plan  ASA: 3  Anesthesia Plan: General and Spinal   Post-op Pain Management:    Induction: Intravenous  PONV Risk Score and Plan: 2 and Propofol infusion, TIVA, Treatment Sulak vary due to age or medical condition and Ondansetron  Airway Management Planned: Natural Airway and Nasal Cannula  Additional Equipment:   Intra-op Plan:   Post-operative Plan:   Informed Consent: I have reviewed the patients History and Physical, chart, labs and discussed the procedure including the risks, benefits and alternatives for the proposed anesthesia with the patient or authorized representative who has indicated his/her understanding and acceptance.       Plan Discussed with: CRNA  Anesthesia Plan Comments: (Plan for spinal and GA with natural airway, LMA/GETA backup.  Patient consented for risks of anesthesia including but not limited  to:  - adverse reactions to medications - damage to eyes, teeth, lips or other oral mucosa - nerve damage due to positioning  - sore throat or hoarseness - headache, bleeding, infection, nerve damage 2/2 spinal - damage to heart, brain, nerves, lungs, other  parts of body or loss of life  Informed patient about role of CRNA in peri- and intra-operative care.  Patient voiced understanding.)        Anesthesia Quick Evaluation

## 2022-07-14 NOTE — Op Note (Signed)
07/14/2022  12:19 PM  PATIENT:  [name]  80 y.o. male  PRE-OPERATIVE DIAGNOSIS:  Primary localized osteoarthritis of right hip  M16.11  POST-OPERATIVE DIAGNOSIS:  Primary localized osteoarthritis of right hip  M16.11  PROCEDURE:  Procedure(s): TOTAL HIP ARTHROPLASTY ANTERIOR APPROACH (Right)  SURGEON: Laurene Footman, MD  ASSISTANTS: None  ANESTHESIA:   spinal  EBL:  Total I/O In: 200 [IV Piggyback:200] Out: 450 [Urine:350; Blood:100]  BLOOD ADMINISTERED:none  DRAINS: none   LOCAL MEDICATIONS USED:  MARCAINE    and OTHER Exparel  SPECIMEN:  Source of Specimen:    Right femoral head  DISPOSITION OF SPECIMEN:  PATHOLOGY  COUNTS:  YES  TOURNIQUET:  * No tourniquets in log *  IMPLANTS: Medacta cemented #2 AMIS stem with metal S 28 mm head 54 mm mpact DM cup and liner  DICTATION: .Dragon Dictation   The patient was brought to the operating room and after spinal anesthesia was obtained patient was placed on the operative table with the ipsilateral foot into the Medacta attachment, contralateral leg on a well-padded table. C-arm was brought in and preop template x-ray taken. After prepping and draping in usual sterile fashion appropriate patient identification and timeout procedures were completed. Anterior approach to the hip was obtained and centered over the greater trochanter and TFL muscle. The subcutaneous tissue was incised hemostasis being achieved by electrocautery. TFL fascia was incised and the muscle retracted laterally deep retractor placed. The lateral femoral circumflex vessels were identified and ligated. The anterior capsule was exposed and a capsulotomy performed. The neck was identified and a femoral neck cut carried out with a saw. The head was removed without difficulty and showed sclerotic femoral head and acetabulum. Reaming was carried out to 54 mm and a 54 mm cup trial gave appropriate tightness to the acetabular component a 54 DM cup was impacted into  position. The leg was then externally rotated and ischiofemoral and pubofemoral releases carried out. The femur was sequentially broached to a size 3, size S head with 3 trials were placed and the final components chosen.  Cement restrictor was placed down the canal followed by pressurizing cement and placing the size 2 standard cemented stem.  After excess cement was removed the S head was assembled with a 54 mm DM head and impacted onto the stem the hip was reduced and was stable the wound was thoroughly irrigated with fibrillar placed along the posterior capsule and medial neck. The deep fascia ws closed using a heavy Quill after infiltration of 30 cc of quarter percent Sensorcaine with epinephrine diluted with Exparel throughout the case, .3-0 V-loc to close the skin with skin staples.  Incisional wound VAC applied and patient was sent to recovery in stable condition.   PLAN OF CARE: Admit for overnight observation

## 2022-07-15 ENCOUNTER — Encounter: Payer: Self-pay | Admitting: Orthopedic Surgery

## 2022-07-15 DIAGNOSIS — M1611 Unilateral primary osteoarthritis, right hip: Secondary | ICD-10-CM | POA: Diagnosis not present

## 2022-07-15 LAB — BASIC METABOLIC PANEL
Anion gap: 6 (ref 5–15)
BUN: 10 mg/dL (ref 8–23)
CO2: 25 mmol/L (ref 22–32)
Calcium: 8.1 mg/dL — ABNORMAL LOW (ref 8.9–10.3)
Chloride: 99 mmol/L (ref 98–111)
Creatinine, Ser: 0.83 mg/dL (ref 0.61–1.24)
GFR, Estimated: >60 mL/min (ref >60)
Glucose, Bld: 142 mg/dL — ABNORMAL HIGH (ref 70–99)
Potassium: 4.5 mmol/L (ref 3.5–5.1)
Sodium: 130 mmol/L — ABNORMAL LOW (ref 135–145)

## 2022-07-15 LAB — CBC
HCT: 31.1 % — ABNORMAL LOW (ref 39.0–52.0)
Hemoglobin: 10.3 g/dL — ABNORMAL LOW (ref 13.0–17.0)
MCH: 29.7 pg (ref 26.0–34.0)
MCHC: 33.1 g/dL (ref 30.0–36.0)
MCV: 89.6 fL (ref 80.0–100.0)
Platelets: 221 10*3/uL (ref 150–400)
RBC: 3.47 MIL/uL — ABNORMAL LOW (ref 4.22–5.81)
RDW: 12.4 % (ref 11.5–15.5)
WBC: 13.3 10*3/uL — ABNORMAL HIGH (ref 4.0–10.5)
nRBC: 0 % (ref 0.0–0.2)

## 2022-07-15 MED ORDER — METHOCARBAMOL 500 MG PO TABS: 500.0000 mg | ORAL_TABLET | Freq: Four times a day (QID) | ORAL | 0 refills | Status: DC | PRN

## 2022-07-15 MED ORDER — DOCUSATE SODIUM 100 MG PO CAPS: 100.0000 mg | ORAL_CAPSULE | Freq: Two times a day (BID) | ORAL | 0 refills | Status: AC

## 2022-07-15 MED ORDER — ENOXAPARIN SODIUM 40 MG/0.4ML IJ SOSY: 40.0000 mg | PREFILLED_SYRINGE | INTRAMUSCULAR | 0 refills | Status: DC

## 2022-07-15 MED ORDER — MAGNESIUM HYDROXIDE 400 MG/5ML PO SUSP
30.0000 mL | Freq: Once | ORAL | Status: AC
  Administered 2022-07-15: 30 mL via ORAL
  Filled 2022-07-15: qty 30

## 2022-07-15 MED ORDER — HYDROCODONE-ACETAMINOPHEN 7.5-325 MG PO TABS: 1.0000 | ORAL_TABLET | ORAL | 0 refills | Status: DC | PRN

## 2022-07-15 MED ORDER — TRAMADOL HCL 50 MG PO TABS: 50.0000 mg | ORAL_TABLET | Freq: Four times a day (QID) | ORAL | 0 refills | Status: AC

## 2022-07-15 NOTE — Evaluation (Signed)
Physical Therapy Evaluation Patient Details Name: [name] Hillis MRN: [id number] DOB: Nov 26, 1941 Today's Date: 07/15/2022  History of Present Illness  Pt is an 81 yo male s/p R THA, anterior approach. PMH of HTN, CAD, CHF.  Clinical Impression  Patient alert, agreeable to PT with encouragement. Reported 5/10 R hip pain, RN in room and gave pain medication during session. The patient/family reported at baseline he is independent, will have his wife available at discharge to assist.  The patient was able to perform bed mobility with minA for RLE, pt stated he was still having difficulty moving his RLE, pt a bit self limiting. Reliant on bed rails to come up into sitting. Sit <> Stand with RW and CGA, extra time. Does pull on recliner arm to achieve standing without physical assistance. He ambulated ~9f in room, further mobility and exercises deferred due to breakfast arrival (pt also reported some dizziness).  Overall the patient demonstrated deficits (see "PT Problem List") that impede the patient's functional abilities, safety, and mobility and would benefit from skilled PT intervention. Recommendation at this time is HHPT with frequent/constant supervision/assistance pending further progress with mobility.        Recommendations for follow up therapy are one component of a multi-disciplinary discharge planning process, led by the attending physician.  Recommendations [place]ividen be updated based on patient status, additional functional criteria and insurance authorization.  Follow Up Recommendations Home health PT      Assistance Recommended at Discharge Frequent or constant Supervision/Assistance  Patient can return home with the following  A little help with walking and/or transfers;A little help with bathing/dressing/bathroom;Assistance with cooking/housework;Assist for transportation;Help with stairs or ramp for entrance    Equipment Recommendations None recommended by PT  Recommendations for  Other Services       Functional Status Assessment Patient has had a recent decline in their functional status and demonstrates the ability to make significant improvements in function in a reasonable and predictable amount of time.     Precautions / Restrictions Precautions Precautions: Anterior Hip Precaution Booklet Issued: Yes (comment) Restrictions Weight Bearing Restrictions: Yes RLE Weight Bearing: Weight bearing as tolerated      Mobility  Bed Mobility Overal bed mobility: Needs Assistance Bed Mobility: Supine to Sit     Supine to sit: Min assist     General bed mobility comments: extended time, use of bed rails, pt a bit self limiting    Transfers Overall transfer level: Needs assistance Equipment used: Rolling walker (2 wheels) Transfers: Sit to/from Stand Sit to Stand: Min guard           General transfer comment: extra time, reliant on pulling on recliner arm to come up into standing    Ambulation/Gait   Gait Distance (Feet): 20 Feet Assistive device: Rolling walker (2 wheels)         General Gait Details: antalgic, step to gait, did report some dizziness once returned to recliner.  Stairs            Wheelchair Mobility    Modified Rankin (Stroke Patients Only)       Balance Overall balance assessment: Needs assistance Sitting-balance support: Feet supported Sitting balance-Leahy Scale: Good Sitting balance - Comments: pt able to take medicines once seated in recliner   Standing balance support: Reliant on assistive device for balance Standing balance-Leahy Scale: Poor  Pertinent Vitals/Pain Pain Assessment Pain Assessment: 0-10 Pain Score: 5  Pain Location: R hip Pain Descriptors / Indicators: Aching, Sore Pain Intervention(s): Limited activity within patient's tolerance, Monitored during session, Repositioned    Home Living Family/patient expects to be discharged to:: Private  residence Living Arrangements: Spouse/significant other Available Help at Discharge: Family;Available 24 hours/day;Neighbor Type of Home: House Home Access: Stairs to enter   [employer] of Steps: 1   Home Layout: One level Home Equipment: Conservation officer, nature (2 wheels);Cane - single point;BSC/3in1      Prior Function Prior Level of Function : Independent/Modified Independent                     Hand Dominance        Extremity/Trunk Assessment   Upper Extremity Assessment Upper Extremity Assessment: Overall WFL for tasks assessed    Lower Extremity Assessment Lower Extremity Assessment: Generalized weakness (s/p R THA, pt had difficulty moving RLE out of bed independently)    Cervical / Trunk Assessment Cervical / Trunk Assessment: Normal  Communication   Communication: No difficulties  Cognition Arousal/Alertness: Awake/alert Behavior During Therapy: WFL for tasks assessed/performed Overall Cognitive Status: Within Functional Limits for tasks assessed                                          General Comments      Exercises     Assessment/Plan    PT Assessment Patient needs continued PT services  PT Problem List Decreased strength;Decreased mobility;Decreased range of motion;Decreased activity tolerance;Decreased balance;Pain;Decreased knowledge of use of DME;Decreased knowledge of precautions       PT Treatment Interventions DME instruction;Therapeutic exercise;Gait training;Balance training;Stair training;Neuromuscular re-education;Therapeutic activities;Patient/family education;Functional mobility training    PT Goals (Current goals can be found in the Care Plan section)  Acute Rehab PT Goals Patient Stated Goal: to feel better PT Goal Formulation: With patient Time For Goal Achievement: 07/29/22 Potential to Achieve Goals: Good    Frequency BID     Co-evaluation               AM-PAC PT "6 Clicks" Mobility   Outcome Measure Help needed turning from your back to your side while in a flat bed without using bedrails?: A Little Help needed moving from lying on your back to sitting on the side of a flat bed without using bedrails?: A Little Help needed moving to and from a bed to a chair (including a wheelchair)?: A Little Help needed standing up from a chair using your arms (e.g., wheelchair or bedside chair)?: A Little Help needed to walk in hospital room?: A Little Help needed climbing 3-5 steps with a railing? : A Lot 6 Click Score: 17    End of Session Equipment Utilized During Treatment: Gait belt Activity Tolerance: Patient tolerated treatment well (did report some dizziness) Patient left: in chair;with chair alarm set;with call bell/phone within reach;with family/visitor present;with nursing/sitter in room Nurse Communication: Mobility status PT Visit Diagnosis: Other abnormalities of gait and mobility (R26.89);Difficulty in walking, not elsewhere classified (R26.2);Muscle weakness (generalized) (M62.81);Pain Pain - Right/Left: Right Pain - part of body: Hip    Time: 9233-0076 PT Time Calculation (min) (ACUTE ONLY): 20 min   Charges:   PT Evaluation $PT Eval Low Complexity: 1 Low PT Treatments $Therapeutic Activity: 8-22 mins        Lieutenant Diego PT, DPT 9:13  AM,07/15/22

## 2022-07-15 NOTE — TOC Progression Note (Signed)
Transition of Care [facility]) - Progression Note    Patient Details  Name: [name] MRN: [id number] Date of Birth: 08-Apr-1941  Transition of Care [facility]) CM/SW Contact  Eileen Stanford, LCSW Phone Number: 07/15/2022, 2:31 PM  Clinical Narrative:   CSW confirmed with pt that he has a walker and will not need a BSC or 3n1.          Expected Discharge Plan and Services                                                 Social Determinants of Health (SDOH) Interventions    Readmission Risk Interventions     No data to display

## 2022-07-15 NOTE — Progress Notes (Signed)
Physical Therapy Treatment Patient Details Name: [name] MRN: [id number] DOB: Jul 17, 1941 Today's Date: 07/15/2022   History of Present Illness Pt is an 81 yo male s/p R THA, anterior approach. PMH of HTN, CAD, CHF.    PT Comments    Patient alert, agreeable to PT, up in recliner at start/end of session and reported 8/10 R knee pain with mobility. The patient was able to transfer with CGA and RW, does still rely on bed rail from recliner to pull up into standing. He ambulated ~285f with RW, CGA, and close chair follow, pt did often stop to speak to PT. Noted for varying gait pattern as well as velocity, noted to still have R foot drop, MD and RN notified. Returned to room with all needs in reach. The patient would benefit from further skilled PT intervention to continue to progress towards goals. Recommendation remains appropriate.    Recommendations for follow up therapy are one component of a multi-disciplinary discharge planning process, led by the attending physician.  Recommendations [place] be updated based on patient status, additional functional criteria and insurance authorization.  Follow Up Recommendations  Home health PT     Assistance Recommended at Discharge Frequent or constant Supervision/Assistance  Patient can return home with the following A little help with walking and/or transfers;A little help with bathing/dressing/bathroom;Assistance with cooking/housework;Assist for transportation;Help with stairs or ramp for entrance   Equipment Recommendations  None recommended by PT    Recommendations for Other Services       Precautions / Restrictions Precautions Precautions: Anterior Hip Precaution Booklet Issued: Yes (comment) Restrictions Weight Bearing Restrictions: Yes RLE Weight Bearing: Weight bearing as tolerated     Mobility  Bed Mobility Overal bed mobility: Needs Assistance Bed Mobility: Supine to Sit     Supine to sit: Min assist     General bed  mobility comments: pt up in recliner at end of session/start of session    Transfers Overall transfer level: Needs assistance Equipment used: Rolling walker (2 wheels) Transfers: Sit to/from Stand Sit to Stand: Min guard           General transfer comment: extra time, reliant on pulling on recliner arm to come up into standing    Ambulation/Gait Ambulation/Gait assistance: Min guard Gait Distance (Feet): 220 Feet Assistive device: Rolling walker (2 wheels)         General Gait Details: varying gait pattern as well as velocity, noted to still have R foot drop   Stairs             Wheelchair Mobility    Modified Rankin (Stroke Patients Only)       Balance Overall balance assessment: Needs assistance Sitting-balance support: Feet supported Sitting balance-Leahy Scale: Good Sitting balance - Comments: pt able to take medicines once seated in recliner   Standing balance support: Reliant on assistive device for balance Standing balance-Leahy Scale: Poor                              Cognition Arousal/Alertness: Awake/alert Behavior During Therapy: WFL for tasks assessed/performed Overall Cognitive Status: Within Functional Limits for tasks assessed                                          Exercises      General Comments  Pertinent Vitals/Pain Pain Assessment Pain Assessment: 0-10 Pain Score: 8  Pain Location: R hip Pain Descriptors / Indicators: Aching, Sore, Grimacing Pain Intervention(s): Limited activity within patient's tolerance, Monitored during session, Repositioned    Home Living                          Prior Function            PT Goals (current goals can now be found in the care plan section) Progress towards PT goals: Progressing toward goals    Frequency    BID      PT Plan Current plan remains appropriate    Co-evaluation              AM-PAC PT "6 Clicks" Mobility    Outcome Measure  Help needed turning from your back to your side while in a flat bed without using bedrails?: A Little Help needed moving from lying on your back to sitting on the side of a flat bed without using bedrails?: A Little Help needed moving to and from a bed to a chair (including a wheelchair)?: A Little Help needed standing up from a chair using your arms (e.g., wheelchair or bedside chair)?: A Little Help needed to walk in hospital room?: A Little Help needed climbing 3-5 steps with a railing? : A Lot 6 Click Score: 17    End of Session Equipment Utilized During Treatment: Gait belt Activity Tolerance: Patient tolerated treatment well (did report some dizziness) Patient left: in chair;with chair alarm set;with call bell/phone within reach;with family/visitor present;with nursing/sitter in room Nurse Communication: Mobility status PT Visit Diagnosis: Other abnormalities of gait and mobility (R26.89);Difficulty in walking, not elsewhere classified (R26.2);Muscle weakness (generalized) (M62.81);Pain Pain - Right/Left: Right Pain - part of body: Hip     Time: 3532-9924 PT Time Calculation (min) (ACUTE ONLY): 27 min  Charges:  $Gait Training: 8-22 mins $Therapeutic Exercise: 8-22 mins                     Lieutenant Diego PT, DPT 3:01 PM,07/15/22

## 2022-07-15 NOTE — Plan of Care (Signed)
Patient sleeping between care. Aox4. Pain controlled. Pt dangled on edge of bed last night. Wound vac intact. Plan of care reviewed with patient. Call bell within reach. Bed alarm on.   PLAN OF CARE ONGOING  Problem: Education: Goal: Knowledge of General Education information will improve Description: Including pain rating scale, medication(s)/side effects and non-pharmacologic comfort measures Outcome: Progressing   Problem: Health Behavior/Discharge Planning: Goal: Ability to manage health-related needs will improve Outcome: Progressing   Problem: Clinical Measurements: Goal: Ability to maintain clinical measurements within normal limits will improve Outcome: Progressing Goal: Will remain free from infection Outcome: Progressing Goal: Diagnostic test results will improve Outcome: Progressing Goal: Respiratory complications will improve Outcome: Progressing Goal: Cardiovascular complication will be avoided Outcome: Progressing   Problem: Activity: Goal: Risk for activity intolerance will decrease Outcome: Progressing   Problem: Nutrition: Goal: Adequate nutrition will be maintained Outcome: Progressing   Problem: Coping: Goal: Level of anxiety will decrease Outcome: Progressing   Problem: Elimination: Goal: Will not experience complications related to bowel motility Outcome: Progressing Goal: Will not experience complications related to urinary retention Outcome: Progressing   Problem: Pain Managment: Goal: General experience of comfort will improve Outcome: Progressing   Problem: Safety: Goal: Ability to remain free from injury will improve Outcome: Progressing   Problem: Skin Integrity: Goal: Risk for impaired skin integrity will decrease Outcome: Progressing   Problem: Education: Goal: Knowledge of the prescribed therapeutic regimen will improve Outcome: Progressing Goal: Understanding of discharge needs will improve Outcome: Progressing Goal:  Individualized Educational Video(s) Outcome: Progressing   Problem: Activity: Goal: Ability to avoid complications of mobility impairment will improve Outcome: Progressing Goal: Ability to tolerate increased activity will improve Outcome: Progressing   Problem: Clinical Measurements: Goal: Postoperative complications will be avoided or minimized Outcome: Progressing   Problem: Pain Management: Goal: Pain level will decrease with appropriate interventions Outcome: Progressing   Problem: Skin Integrity: Goal: Will show signs of wound healing Outcome: Progressing

## 2022-07-15 NOTE — Discharge Summary (Signed)
Physician Discharge Summary  Patient ID: [name] MRN: [id number] DOB/AGE: [birth year] 81 y.o.  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   Bilateral pleural effusion 01/07/2020   Chest pain 01/07/2020   NSTEMI (non-ST elevated myocardial infarction) ([place]) 01/07/2020   Hyponatremia 01/07/2020   Hypertension    Hyperlipidemia    Elevated troponin    Advance directive discussed with patient 05/25/2016   Routine general medical examination at a health care facility 04/05/2012   IRRITABLE BOWEL SYNDROME 07/11/2010   DIVERTICULOSIS-COLON 12/06/2009   HEPATIC CYST 12/06/2009   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 12/2019 NSTEMI/Cath: LM 99ost/m, LAD 40, LCX nl, OM1 80, RCA 70ost/p, 99p, 30d, EF 45-50%; b. 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   Complication of anesthesia    a.) delayed emergence; b.) emergence delirium   Diverticulosis    HFrEF (heart failure with reduced ejection fraction) ([place])    a.) TEE 01/08/2020: EF 45-50%; b.) TTE 04/14/2022: EF 50-55%, septal HK, G2DD, GLS /18.3%, mild-mod MR, AoV sclerosis without stenosis.   Hyperlipidemia    Hypertension    Hyponatremia    IBS (irritable bowel syndrome)    Ischemic cardiomyopathy    a. 12/2019 TEE: EF 45-50%. Nl RV fxn. Mild MR.   NSTEMI (non-ST elevated  myocardial infarction) ([place]) 01/07/2020   a.) LHC 01/08/2020: EF 45-50%, LVEDP 22 mmHg; 99% o-mLM, 80% OM1, 40% mLAD, 70% o-pRCA, 99% pRCA, 30% dRCA --> consult CVTS; b.) 4v CABG 01/07/2022: LIMA-LAD, SVG-RPDA, SVG-OM1, SVG-OM2.   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   Skin cancer    a.) s/p Mohs surgery   Statin intolerance      Transfusion: none   Consultants (if any):   Discharged Condition: Improved  Hospital Course: [name] is an 81 y.o. male who was admitted 07/14/2022 with a diagnosis of Status post total hip replacement, right and went to the operating room on 07/14/2022 and underwent the above named procedures.    Surgeries: Procedure(s): TOTAL HIP ARTHROPLASTY ANTERIOR APPROACH on 07/14/2022 Patient tolerated the surgery well. Taken to PACU where she was stabilized and then transferred to the orthopedic floor.  Started on Lovenox 40 mg q 24 hrs. Foot pumps applied bilaterally at 80 mm. Heels elevated on bed with rolled towels. No evidence of DVT. Negative [place]. Physical therapy started on day #1 for gait training and transfer. OT started day #1 for ADL and assisted devices.  Patient's foley was d/c on day #1. Patient's IV was d/c on day #1.  On post op day #3 patient was stable and ready for discharge to home with HHPT.  He was given perioperative antibiotics:  Anti-infectives (From admission, onward)    Start     Dose/Rate Route Frequency Ordered Stop   07/14/22 1545  ceFAZolin (ANCEF) IVPB 1 g/50 mL premix        1 g 100 mL/hr over 30 Minutes Intravenous Every 6 hours 07/14/22 1452 07/15/22 0743   07/14/22 0810  ceFAZolin (ANCEF) 2-4 GM/100ML-% IVPB       Note to Pharmacy: Jeanene Erb E: cabinet override      07/14/22 0810 07/14/22 1053   07/14/22 0600  ceFAZolin  (ANCEF) IVPB 2g/100 mL premix        2 g 200 mL/hr over 30 Minutes Intravenous On call to O.R. 07/14/22 0325 07/14/22 1050     .  He was given sequential compression devices, early ambulation, and Lovenox TEDs for DVT prophylaxis.  He benefited maximally from the hospital stay and there were no complications.    Recent vital signs:  Vitals:   07/17/22 0354 07/17/22 0742  BP: (!) 114/52 (!) 128/56  Pulse: 65 60  Resp: 15 16  Temp: 98.6 F (37 C) 98.2 F (36.8 C)  SpO2: 91% 94%    Recent laboratory studies:  Lab Results  Component Value Date   HGB 9.5 (L) 07/16/2022   HGB 10.3 (L) 07/15/2022   HGB 13.1 07/14/2022   Lab Results  Component Value Date   WBC 12.8 (H) 07/16/2022   PLT 200 07/16/2022   Lab Results  Component Value Date   INR 1.0 01/31/2020   Lab Results  Component Value Date   NA 130 (L) 07/15/2022   K 4.5 07/15/2022   CL 99 07/15/2022   CO2 25 07/15/2022   BUN 10 07/15/2022   CREATININE 0.83 07/15/2022   GLUCOSE 142 (H) 07/15/2022    Discharge Medications:   Allergies as of 07/17/2022       Reactions   Crestor [rosuvastatin] Other (See Comments)   Severe myalgias   Penicillins Rash   Peanut-containing Drug Products Other (See Comments)   "Wired him up"   Atorvastatin Other (See Comments)   ache   Simvastatin Other (See Comments)   aching        Medication List     STOP taking these medications    ACETAMINOPHEN PO   meloxicam 7.5 MG tablet Commonly known as: MOBIC       TAKE these medications    amLODipine 10 MG tablet Commonly known as: NORVASC Take 1 tablet (10 mg total) by mouth daily. What changed: when to take this   aspirin EC 81 MG tablet Take 81 mg by mouth daily.   CALCIUM CARBONATE PO Take 400 mg by mouth daily.   docusate sodium 100 MG capsule Commonly known as: COLACE Take 1 capsule (100 mg total) by mouth 2 (two) times daily.   enoxaparin 40 MG/0.4ML injection Commonly known as: LOVENOX Inject 0.4  mLs (40 mg total) into the skin daily for 14 days.   Fish Oil 1000 MG Caps Take 1 capsule by mouth daily.   Magnesium 200 MG Tabs Take 1 tablet by mouth daily.   METAMUCIL PO Take 1 Dose by mouth at bedtime. Mix with water   methocarbamol 500 MG tablet Commonly known as: ROBAXIN Take 1 tablet (500 mg total) by mouth every 6 (six) hours as needed for muscle spasms.   metoprolol succinate 25 MG 24 hr tablet Commonly known as: TOPROL-XL Take 0.5 tablets (12.5 mg total) by mouth daily. Take with or  immediately following a meal. What changed: when to take this   Repatha SureClick 497 MG/ML [place] Generic drug: Evolocumab Inject 1 pen  into the skin every 14 (fourteen) days.   traMADol 50 MG tablet Commonly known as: ULTRAM Take 1 tablet (50 mg total) by mouth every 6 (six) hours.               Durable Medical Equipment  (From admission, onward)           Start     Ordered   07/14/22 1452  DME Walker rolling  Once       Question Answer Comment  Walker: With 5 Inch Wheels   Patient needs a walker to treat with the following condition Status post total hip replacement, right      07/14/22 1452   07/14/22 1452  DME 3 n 1  Once        07/14/22 1452   07/14/22 1452  DME Bedside commode  Once       Question:  Patient needs a bedside commode to treat with the following condition  Answer:  Status post total hip replacement, right   07/14/22 1452            Diagnostic Studies: DG HIP UNILAT W OR W/O PELVIS 2-3 VIEWS RIGHT  Result Date: 07/14/2022 CLINICAL DATA:  Status post total right hip arthroplasty. Dominant PACU. EXAM: DG HIP (WITH OR WITHOUT PELVIS) 2-3V RIGHT COMPARISON:  None Available. FINDINGS: Status post recent total right hip arthroplasty. Expected postoperative changes including anterior surgical skin staples and anterior lateral proximal hip and thigh soft tissue swelling and subcutaneous air. Surgical clips overlie the proximal medial thigh soft tissues.  Hernia mesh coils overlie the visualized right hemipelvis. No perihardware lucency is seen to indicate hardware failure or loosening. No acute fracture or dislocation. IMPRESSION: Status post recent total right hip arthroplasty without evidence of hardware failure. Electronically Signed   By: Yvonne Kendall M.D.   On: 07/14/2022 12:53   DG HIP UNILAT WITH PELVIS 1V RIGHT  Result Date: 07/14/2022 CLINICAL DATA:  Status post total right hip arthroplasty. EXAM: DG HIP (WITH OR WITHOUT PELVIS) 1V RIGHT COMPARISON:  None Available. FINDINGS: Images were performed intraoperatively without the presence of a radiologist. On the initial image hernia mesh coils overlie the right hemipelvis. Moderate to severe right femoroacetabular joint space narrowing. Postsurgical changes are seen of new total right hip arthroplasty. No hardware complication is seen. Total fluoroscopy images: 2 Total fluoroscopy time: 4 seconds Total dose: Radiation Exposure Index (as provided by the fluoroscopic device): 0.727 mGy air Kerma Please see intraoperative findings for further detail. IMPRESSION: Status post total right hip arthroplasty. Electronically Signed   By: Yvonne Kendall M.D.   On: 07/14/2022 12:15   DG C-Arm 1-60 Min-No Report  Result Date: 07/14/2022 Fluoroscopy was utilized by the requesting physician.  No radiographic interpretation.    Disposition: Discharge disposition: 06-Home-Health Care Svc          Follow-up Information     Duanne Guess, PA-C Follow up in 2 week(s).   Specialties: Orthopedic Surgery, Emergency Medicine Contact information: [place] [address] [phone]                  Signed: Feliberto Gottron 07/17/2022, 12:16 PM

## 2022-07-15 NOTE — Progress Notes (Signed)
Subjective: 1 Day Post-Op Procedure(s) (LRB): TOTAL HIP ARTHROPLASTY ANTERIOR APPROACH (Right) Patient reports pain as moderate.   Patient is well, and has had no acute complaints or problems Denies any CP, SOB, ABD pain. We will continue therapy today.  Plan is to go Home after hospital stay.  Objective: Vital signs in last 24 hours: Temp:  [97 F (36.1 C)-98.2 F (36.8 C)] 98 F (36.7 C) (07/26 0618) Pulse Rate:  [49-73] 65 (07/26 0618) Resp:  [13-24] 17 (07/26 0618) BP: (97-146)/(43-99) 124/58 (07/26 0618) SpO2:  [97 %-100 %] 97 % (07/26 0618) Weight:  [63.8 kg] 63.8 kg (07/25 0832)  Intake/Output from previous day: 07/25 0701 - 07/26 0700 In: 1986.6 [P.O.:240; I.V.:1546.6; IV Piggyback:200] Out: 3050 [Urine:2950; Blood:100] Intake/Output this shift: No intake/output data recorded.  Recent Labs    07/14/22 1557 07/15/22 0600  HGB 13.1 10.3*   Recent Labs    07/14/22 1557 07/15/22 0600  WBC 14.3* 13.3*  RBC 4.34 3.47*  HCT 38.8* 31.1*  PLT 257 221   Recent Labs    07/14/22 1557 07/15/22 0600  NA  --  130*  K  --  4.5  CL  --  99  CO2  --  25  BUN  --  10  CREATININE 0.75 0.83  GLUCOSE  --  142*  CALCIUM  --  8.1*   No results for input(s): "LABPT", "INR" in the last 72 hours.  EXAM General - Patient is Alert, Appropriate, and Oriented Extremity - Neurovascular intact Sensation intact distally Intact pulses distally Dorsiflexion/Plantar flexion intact No cellulitis present Compartment soft Dressing - dressing C/D/I and no drainage, provena intact with out drainage Motor Function - intact, moving foot and toes well on exam.   Past Medical History:  Diagnosis Date   Arthritis    Bilateral pleural effusion    Bone spur 2010   right shoulder   CAD (coronary artery disease)    a. 12/2019 NSTEMI/Cath: LM 99ost/m, LAD 40, LCX nl, OM1 80, RCA 70ost/p, 99p, 30d, EF 45-50%; b. 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   Complication of  anesthesia    a.) delayed emergence; b.) emergence delirium   Diverticulosis    HFrEF (heart failure with reduced ejection fraction) ([place])    a.) TEE 01/08/2020: EF 45-50%; b.) TTE 04/14/2022: EF 50-55%, septal HK, G2DD, GLS /18.3%, mild-mod MR, AoV sclerosis without stenosis.   Hyperlipidemia    Hypertension    Hyponatremia    IBS (irritable bowel syndrome)    Ischemic cardiomyopathy    a. 12/2019 TEE: EF 45-50%. Nl RV fxn. Mild MR.   NSTEMI (non-ST elevated myocardial infarction) ([place]) 01/07/2020   a.) LHC 01/08/2020: EF 45-50%, LVEDP 22 mmHg; 99% o-mLM, 80% OM1, 40% mLAD, 70% o-pRCA, 99% pRCA, 30% dRCA --> consult CVTS; b.) 4v CABG 01/07/2022: LIMA-LAD, SVG-RPDA, SVG-OM1, SVG-OM2.   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   Skin cancer    a.) s/p Mohs surgery   Statin intolerance     Assessment/Plan:   1 Day Post-Op Procedure(s) (LRB): TOTAL HIP ARTHROPLASTY ANTERIOR APPROACH (Right) Principal Problem:   Status post total hip replacement, right  Estimated body mass index is 22.7 kg/m as calculated from the following:   Height as of this encounter: '5\' 6"'$  (1.676 m).   Weight  as of this encounter: 63.8 kg. Advance diet Up with therapy Labs and VSS Pain controlled CM to assist with discharge to home with HHPT  DVT Prophylaxis - Lovenox, TED hose, and SCDS Weight-Bearing as tolerated to right leg   T. Rachelle Hora, PA-C [place] 07/15/2022, 7:40 AM

## 2022-07-15 NOTE — Discharge Instructions (Signed)

## 2022-07-15 NOTE — Evaluation (Signed)
Occupational Therapy Evaluation Patient Details Name: [name] MRN: [id number] DOB: 04/27/1941 Today's Date: 07/15/2022   History of Present Illness Pt is an 81 yo male s/p R THA, anterior approach. PMH of HTN, CAD, CHF.   Clinical Impression   Pt seen for OT evaluation this date, POD#1 from above surgery. Pt was independent in all ADLs prior to surgery, ambulating without AD, no falls history, active around the house, gardening, getting out into the community regularly. Pt is eager to return to PLOF with less pain and improved safety and independence. Pt currently requires Max A for LB dressing; SUPV for transfers, toileting, grooming, UB dressing, supine<sit; Min A for sit<supine. Pt endorses 6/10 pain. Vomits during session, states he feels he has gotten over-tired and that he has not slept in 2 days. Pt left in bed, lights out, hoping he will be able to get a nap. No further OT necessary during hospitalization. Recommend HHOT after DC to assist pt in balance, toileting, bathing, LB dressing, falls prevention, home modifications as necessary.   Recommendations for follow up therapy are one component of a multi-disciplinary discharge planning process, led by the attending physician.  Recommendations Sanjose be updated based on patient status, additional functional criteria and insurance authorization.   Follow Up Recommendations  Home health OT    Assistance Recommended at Discharge Intermittent Supervision/Assistance  Patient can return home with the following A little help with walking and/or transfers;A little help with bathing/dressing/bathroom;Assistance with cooking/housework;Assist for transportation    Functional Status Assessment  Patient has had a recent decline in their functional status and demonstrates the ability to make significant improvements in function in a reasonable and predictable amount of time.  Equipment Recommendations  None recommended by OT    Recommendations  for Other Services       Precautions / Restrictions Precautions Precautions: Anterior Hip Precaution Booklet Issued: Yes (comment) Restrictions Weight Bearing Restrictions: Yes RLE Weight Bearing: Weight bearing as tolerated      Mobility Bed Mobility Overal bed mobility: Needs Assistance Bed Mobility: Supine to Sit, Sit to Supine     Supine to sit: Supervision Sit to supine: Min assist   General bed mobility comments: from very high bed (mimicking pt's bed at home) Min A to raise R LE to bed level during sit<supine    Transfers Overall transfer level: Needs assistance Equipment used: Rolling walker (2 wheels) Transfers: Sit to/from Stand Sit to Stand: Supervision                  Balance Overall balance assessment: Needs assistance Sitting-balance support: Feet supported Sitting balance-Leahy Scale: Good     Standing balance support: Reliant on assistive device for balance, Bilateral upper extremity supported Standing balance-Leahy Scale: Fair                             ADL either performed or assessed with clinical judgement   ADL Overall ADL's : Needs assistance/impaired     Grooming: Wash/dry hands;Wash/dry face;Oral care;Set up;Supervision/safety Grooming Details (indicate cue type and reason): in sitting Upper Body Bathing: Set up;Modified independent       Upper Body Dressing : Modified independent;Sitting Upper Body Dressing Details (indicate cue type and reason): changing hospital gown     Toilet Transfer: Supervision/safety;Rolling walker (2 wheels);Ambulation   Toileting- Clothing Manipulation and Hygiene: Supervision/safety;Sit to/from stand               Vision  Perception     Praxis      Pertinent Vitals/Pain Pain Assessment Pain Score: 6  Pain Descriptors / Indicators: Aching, Sore, Grimacing Pain Intervention(s): Repositioned     Hand Dominance     Extremity/Trunk Assessment Upper Extremity  Assessment Upper Extremity Assessment: Overall WFL for tasks assessed   Lower Extremity Assessment Lower Extremity Assessment: Generalized weakness;RLE deficits/detail;Overall WFL for tasks assessed;LLE deficits/detail RLE Deficits / Details: s/p R THA LLE Deficits / Details: WFL   Cervical / Trunk Assessment Cervical / Trunk Assessment: Normal   Communication Communication Communication: No difficulties   Cognition Arousal/Alertness: Awake/alert Behavior During Therapy: WFL for tasks assessed/performed Overall Cognitive Status: Within Functional Limits for tasks assessed                                       General Comments       Exercises Other Exercises Other Exercises: Educ re: LB dressing, falls prevention, pain mgmt   Shoulder Instructions      Home Living Family/patient expects to be discharged to:: Private residence Living Arrangements: Spouse/significant other Available Help at Discharge: Family;Available 24 hours/day;Neighbor Type of Home: House Home Access: Stairs to enter [employer] of Steps: 1   Home Layout: One level     Bathroom Shower/Tub: Occupational psychologist: Handicapped height     Home Equipment: Conservation officer, nature (2 wheels);Cane - single point;BSC/3in1          Prior Functioning/Environment Prior Level of Function : Independent/Modified Independent             Mobility Comments: no AD, no falls history ADLs Comments: IND and active        OT Problem List: Decreased activity tolerance;Decreased knowledge of use of DME or AE;Decreased range of motion;Decreased strength;Impaired balance (sitting and/or standing);Pain      OT Treatment/Interventions:      OT Goals(Current goals can be found in the care plan section) Acute Rehab OT Goals Patient Stated Goal: to be able to go on cruise in the fall OT Goal Formulation: With patient Time For Goal Achievement: 07/29/22 Potential to Achieve Goals:  Good  OT Frequency:      Co-evaluation              AM-PAC OT "6 Clicks" Daily Activity     Outcome Measure Help from another person eating meals?: None Help from another person taking care of personal grooming?: A Little Help from another person toileting, which includes using toliet, bedpan, or urinal?: A Little Help from another person bathing (including washing, rinsing, drying)?: A Little Help from another person to put on and taking off regular upper body clothing?: A Little Help from another person to put on and taking off regular lower body clothing?: A Lot 6 Click Score: 18   End of Session Equipment Utilized During Treatment: Rolling walker (2 wheels)  Activity Tolerance: Patient tolerated treatment well Patient left: in bed;with call bell/phone within reach;with bed alarm set;with family/visitor present  OT Visit Diagnosis: Unsteadiness on feet (R26.81);Muscle weakness (generalized) (M62.81)                Time: 2800-3491 OT Time Calculation (min): 41 min Charges:  OT General Charges $OT Visit: 1 Visit OT Evaluation $OT Eval Moderate Complexity: 1 Mod OT Treatments $Self Care/Home Management : 38-52 mins Josiah Lobo, PhD, MS, OTR/L 07/15/22, 3:38 PM

## 2022-07-16 DIAGNOSIS — M1611 Unilateral primary osteoarthritis, right hip: Secondary | ICD-10-CM | POA: Diagnosis not present

## 2022-07-16 LAB — CBC
HCT: 27.7 % — ABNORMAL LOW (ref 39.0–52.0)
Hemoglobin: 9.5 g/dL — ABNORMAL LOW (ref 13.0–17.0)
MCH: 30.4 pg (ref 26.0–34.0)
MCHC: 34.3 g/dL (ref 30.0–36.0)
MCV: 88.5 fL (ref 80.0–100.0)
Platelets: 200 10*3/uL (ref 150–400)
RBC: 3.13 MIL/uL — ABNORMAL LOW (ref 4.22–5.81)
RDW: 12.3 % (ref 11.5–15.5)
WBC: 12.8 10*3/uL — ABNORMAL HIGH (ref 4.0–10.5)
nRBC: 0 % (ref 0.0–0.2)

## 2022-07-16 LAB — SURGICAL PATHOLOGY

## 2022-07-16 NOTE — Progress Notes (Signed)
Subjective: 2 Days Post-Op Procedure(s) (LRB): TOTAL HIP ARTHROPLASTY ANTERIOR APPROACH (Right) Patient reports pain as mild.  Patient states he feels drugged "swimmy headed". Wife at bedside, reports no confusion. No reports of dizziness. Denies any CP, SOB, ABD pain. We will continue therapy today.  Plan is to go Home after hospital stay.  Objective: Vital signs in last 24 hours: Temp:  [97.4 F (36.3 C)-98.5 F (36.9 C)] 97.8 F (36.6 C) (07/27 0742) Pulse Rate:  [53-63] 62 (07/27 0742) Resp:  [16-20] 16 (07/27 0742) BP: (113-137)/(53-63) 134/59 (07/27 0742) SpO2:  [90 %-98 %] 90 % (07/27 0742)  Intake/Output from previous day: 07/26 0701 - 07/27 0700 In: 480 [P.O.:480] Out: 0  Intake/Output this shift: No intake/output data recorded.  Recent Labs    07/14/22 1557 07/15/22 0600 07/16/22 0358  HGB 13.1 10.3* 9.5*   Recent Labs    07/15/22 0600 07/16/22 0358  WBC 13.3* 12.8*  RBC 3.47* 3.13*  HCT 31.1* 27.7*  PLT 221 200   Recent Labs    07/14/22 1557 07/15/22 0600  NA  --  130*  K  --  4.5  CL  --  99  CO2  --  25  BUN  --  10  CREATININE 0.75 0.83  GLUCOSE  --  142*  CALCIUM  --  8.1*   No results for input(s): "LABPT", "INR" in the last 72 hours.  EXAM General - Patient is Alert, Appropriate, and Oriented Extremity - Neurovascular intact Sensation intact distally Intact pulses distally Dorsiflexion/Plantar flexion intact No cellulitis present Compartment soft Dressing - dressing C/D/I and no drainage, provena intact with out drainage Motor Function - intact, moving foot and toes well on exam. No foot drop BLE  Past Medical History:  Diagnosis Date   Arthritis    Bilateral pleural effusion    Bone spur 2010   right shoulder   CAD (coronary artery disease)    a. 12/2019 NSTEMI/Cath: LM 99ost/m, LAD 40, LCX nl, OM1 80, RCA 70ost/p, 99p, 30d, EF 45-50%; b. 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   Complication of anesthesia     a.) delayed emergence; b.) emergence delirium   Diverticulosis    HFrEF (heart failure with reduced ejection fraction) ([place])    a.) TEE 01/08/2020: EF 45-50%; b.) TTE 04/14/2022: EF 50-55%, septal HK, G2DD, GLS /18.3%, mild-mod MR, AoV sclerosis without stenosis.   Hyperlipidemia    Hypertension    Hyponatremia    IBS (irritable bowel syndrome)    Ischemic cardiomyopathy    a. 12/2019 TEE: EF 45-50%. Nl RV fxn. Mild MR.   NSTEMI (non-ST elevated myocardial infarction) ([place]) 01/07/2020   a.) LHC 01/08/2020: EF 45-50%, LVEDP 22 mmHg; 99% o-mLM, 80% OM1, 40% mLAD, 70% o-pRCA, 99% pRCA, 30% dRCA --> consult CVTS; b.) 4v CABG 01/07/2022: LIMA-LAD, SVG-RPDA, SVG-OM1, SVG-OM2.   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   Skin cancer    a.) s/p Mohs surgery   Statin intolerance     Assessment/Plan:   2 Days Post-Op Procedure(s) (LRB): TOTAL HIP ARTHROPLASTY ANTERIOR APPROACH (Right) Principal Problem:   Status post total hip replacement, right  Estimated body mass index is 22.7 kg/m as calculated from the following:   Height as of this encounter: '5\' 6"'$  (1.676 m).   Weight as  of this encounter: 63.8 kg. Advance diet Up with therapy Labs and VSS Pain controlled. Limit narcotics today. Monitor mental status CM to assist with discharge to home with HHPT pending PT progress  DVT Prophylaxis - Lovenox, TED hose, and SCDS Weight-Bearing as tolerated to right leg   T. Rachelle Hora, PA-C [place] 07/16/2022, 9:25 AM

## 2022-07-16 NOTE — Progress Notes (Signed)
Physical Therapy Treatment Patient Details Name: [name] MRN: [id number] DOB: 31-May-1941 Today's Date: 07/16/2022   History of Present Illness Pt is an 81 yo male s/p R THA, anterior approach. PMH of HTN, CAD, CHF.    PT Comments    Pt was just returning from [place] upon arriving. He is A and O and very motivated. Easily and safely able to stand and ambulate with RW without safety concern. Much improved dorsiflexion of RLE. Pt's only complaint is lack of having BM. He safely performed stairs without concerns. Pt is cleared from an acute PT stand point for safe DC home with HHPT to follow.   Recommendations for follow up therapy are one component of a multi-disciplinary discharge planning process, led by the attending physician.  Recommendations [name] be updated based on patient status, additional functional criteria and insurance authorization.  Follow Up Recommendations  Home health PT     Assistance Recommended at Discharge Set up Supervision/Assistance  Patient can return home with the following A little help with walking and/or transfers;A little help with bathing/dressing/bathroom;Assistance with cooking/housework;Assist for transportation;Help with stairs or ramp for entrance   Equipment Recommendations  None recommended by PT       Precautions / Restrictions Precautions Precautions: Anterior Hip Precaution Booklet Issued: Yes (comment) Restrictions Weight Bearing Restrictions: Yes RLE Weight Bearing: Weight bearing as tolerated     Mobility  Bed Mobility  General bed mobility comments: in recliner pre/post session    Transfers Overall transfer level: Modified independent Equipment used: Rolling walker (2 wheels) Transfers: Sit to/from Stand Sit to Stand: Modified independent (Device/Increase time)     General transfer comment: no physical assistance required to stand or sit to/from RW    Ambulation/Gait Ambulation/Gait assistance: Supervision Gait Distance  (Feet): 300 Feet Assistive device: Rolling walker (2 wheels) Gait Pattern/deviations: Step-through pattern Gait velocity: decreased     General Gait Details: no LOB or safety concern. No foot drop present today. Vcs for posture correction only   Stairs Stairs: Yes Stairs assistance: Supervision, Min guard Stair Management: No rails, Two rails, Step to pattern, Forwards, Backwards, With walker Number of Stairs: 4 General stair comments: pt performed stairs and curb step with and without RW. no difficulty or safety concern    Balance Overall balance assessment: Modified Independent      Cognition Arousal/Alertness: Awake/alert Behavior During Therapy: WFL for tasks assessed/performed Overall Cognitive Status: Within Functional Limits for tasks assessed      General Comments: Pt is A and O x 4           General Comments General comments (skin integrity, edema, etc.): reviewed wound vac instructions, HEP, and importance of routine mobility      Pertinent Vitals/Pain Pain Assessment Pain Assessment: 0-10 Pain Score: 4  Pain Location: R hip Pain Descriptors / Indicators: Aching, Sore, Grimacing Pain Intervention(s): Limited activity within patient's tolerance, Monitored during session, Premedicated before session, Repositioned     PT Goals (current goals can now be found in the care plan section) Acute Rehab PT Goals Patient Stated Goal: go home Progress towards PT goals: Progressing toward goals    Frequency    BID      PT Plan Current plan remains appropriate       AM-PAC PT "6 Clicks" Mobility   Outcome Measure  Help needed turning from your back to your side while in a flat bed without using bedrails?: A Little Help needed moving from lying on your back to sitting  on the side of a flat bed without using bedrails?: A Little Help needed moving to and from a bed to a chair (including a wheelchair)?: A Little Help needed standing up from a chair using your  arms (e.g., wheelchair or bedside chair)?: A Little Help needed to walk in hospital room?: A Little Help needed climbing 3-5 steps with a railing? : A Little 6 Click Score: 18    End of Session   Activity Tolerance: Patient tolerated treatment well Patient left: in chair;with chair alarm set;with call bell/phone within reach;with family/visitor present;with nursing/sitter in room Nurse Communication: Mobility status PT Visit Diagnosis: Other abnormalities of gait and mobility (R26.89);Difficulty in walking, not elsewhere classified (R26.2);Muscle weakness (generalized) (M62.81);Pain Pain - Right/Left: Right Pain - part of body: Hip     Time: 0740-0809 PT Time Calculation (min) (ACUTE ONLY): 29 min  Charges:  $Gait Training: 23-37 mins                    Julaine Fusi PTA 07/16/22, 8:15 AM

## 2022-07-17 DIAGNOSIS — M1611 Unilateral primary osteoarthritis, right hip: Secondary | ICD-10-CM | POA: Diagnosis not present

## 2022-07-17 NOTE — Progress Notes (Signed)
Physical Therapy Treatment Patient Details Name: [name] MRN: [id number] DOB: [birth year] Today's Date: 07/17/2022   History of Present Illness Pt is an 81 yo male s/p R THA, anterior approach. PMH of HTN, CAD, CHF.    PT Comments    Author returned for BID session. DC orders are in but no summary. MD/PA held DC due to dizziness. Pt did not report dizziness with author in either session. Session was greatly limited by need to have BM. Assisted pt to [facility] with RN tech present. No physical assistance required to stand and ambulate to BR with RW. PT will continue to follow and progress as able per current POC. Recommend DC home with HHPT to follow.   Recommendations for follow up therapy are one component of a multi-disciplinary discharge planning process, led by the attending physician.  Recommendations Trzcinski be updated based on patient status, additional functional criteria and insurance authorization.  Follow Up Recommendations  Home health PT     Assistance Recommended at Discharge Set up Supervision/Assistance  Patient can return home with the following A little help with walking and/or transfers;A little help with bathing/dressing/bathroom;Assistance with cooking/housework;Assist for transportation;Help with stairs or ramp for entrance   Equipment Recommendations  None recommended by PT    Recommendations for Other Services       Precautions / Restrictions Precautions Precautions: Anterior Hip Precaution Booklet Issued: Yes (comment) Restrictions Weight Bearing Restrictions: Yes RLE Weight Bearing: Weight bearing as tolerated     Mobility  Bed Mobility               General bed mobility comments: in recliner pre/post session    Transfers Overall transfer level: Modified independent Equipment used: Rolling walker (2 wheels) Transfers: Sit to/from Stand Sit to Stand: Modified independent (Device/Increase time)                 Ambulation/Gait Ambulation/Gait assistance: Supervision   Assistive device: Rolling walker (2 wheels) Gait Pattern/deviations: Step-through pattern Gait velocity: decreased     General Gait Details: pt ambulate tyo BR to try to have BM.   Stairs             Wheelchair Mobility    Modified Rankin (Stroke Patients Only)       Balance Overall balance assessment: Modified Independent Sitting-balance support: Feet supported Sitting balance-Leahy Scale: Good     Standing balance support: Bilateral upper extremity supported, During functional activity Standing balance-Leahy Scale: Good                              Cognition Arousal/Alertness: Awake/alert Behavior During Therapy: WFL for tasks assessed/performed Overall Cognitive Status: Within Functional Limits for tasks assessed                                 General Comments: Pt is A and O x 4        Exercises      General Comments        Pertinent Vitals/Pain Pain Assessment Pain Location: R hip Pain Descriptors / Indicators: Aching, Sore, Grimacing    Home Living                          Prior Function            PT Goals (current goals can now be found in  the care plan section) Acute Rehab PT Goals Patient Stated Goal: go home    Frequency    BID      PT Plan Current plan remains appropriate    Co-evaluation              AM-PAC PT "6 Clicks" Mobility   Outcome Measure  Help needed turning from your back to your side while in a flat bed without using bedrails?: A Little Help needed moving from lying on your back to sitting on the side of a flat bed without using bedrails?: A Little Help needed moving to and from a bed to a chair (including a wheelchair)?: A Little Help needed standing up from a chair using your arms (e.g., wheelchair or bedside chair)?: A Little Help needed to walk in hospital room?: A Little Help needed climbing 3-5  steps with a railing? : A Little 6 Click Score: 18    End of Session   Activity Tolerance: Patient tolerated treatment well Patient left: Other (comment) (IN BR with RN tech present) Nurse Communication: Mobility status PT Visit Diagnosis: Other abnormalities of gait and mobility (R26.89);Difficulty in walking, not elsewhere classified (R26.2);Muscle weakness (generalized) (M62.81);Pain Pain - Right/Left: Right Pain - part of body: Hip     Time:  -     Charges:                        Julaine Fusi PTA 07/17/22, 7:40 AM

## 2022-07-17 NOTE — Plan of Care (Signed)

## 2022-07-17 NOTE — Plan of Care (Signed)
Patient discharged per MD orders at this time.All discharge instructions,education and medications reviewed with the patient.Pt expressed understanding and will comply with dc instructions.follow up appointments was also communicated to the patient.no verbal c/o or any ssx of distress.Pt was discharged home with HH/PT services per order.Pt was transported home by wife in a privately owned vehicle.

## 2022-07-17 NOTE — Progress Notes (Signed)
Subjective: 3 Days Post-Op Procedure(s) (LRB): TOTAL HIP ARTHROPLASTY ANTERIOR APPROACH (Right) Patient reports pain as mild.  + BM. No reports of dizziness this am. States he feels more clear this am. No brainfog Denies any CP, SOB, ABD pain. We will continue therapy today.  Plan is to go Home after hospital stay.  Objective: Vital signs in last 24 hours: Temp:  [97.8 F (36.6 C)-98.9 F (37.2 C)] 98.6 F (37 C) (07/28 0354) Pulse Rate:  [62-67] 65 (07/28 0354) Resp:  [15-17] 15 (07/28 0354) BP: (114-134)/(48-59) 114/52 (07/28 0354) SpO2:  [90 %-93 %] 91 % (07/28 0354)  Intake/Output from previous day: 07/27 0701 - 07/28 0700 In: 50 [P.O.:50] Out: -  Intake/Output this shift: No intake/output data recorded.  Recent Labs    07/14/22 1557 07/15/22 0600 07/16/22 0358  HGB 13.1 10.3* 9.5*   Recent Labs    07/15/22 0600 07/16/22 0358  WBC 13.3* 12.8*  RBC 3.47* 3.13*  HCT 31.1* 27.7*  PLT 221 200   Recent Labs    07/14/22 1557 07/15/22 0600  NA  --  130*  K  --  4.5  CL  --  99  CO2  --  25  BUN  --  10  CREATININE 0.75 0.83  GLUCOSE  --  142*  CALCIUM  --  8.1*   No results for input(s): "LABPT", "INR" in the last 72 hours.  EXAM General - Patient is Alert, Appropriate, and Oriented Extremity - Neurovascular intact Sensation intact distally Intact pulses distally Dorsiflexion/Plantar flexion intact No cellulitis present Compartment soft Dressing - dressing C/D/I and no drainage, provena intact with out drainage Motor Function - intact, moving foot and toes well on exam. No foot drop BLE  Past Medical History:  Diagnosis Date   Arthritis    Bilateral pleural effusion    Bone spur 2010   right shoulder   CAD (coronary artery disease)    a. 12/2019 NSTEMI/Cath: LM 99ost/m, LAD 40, LCX nl, OM1 80, RCA 70ost/p, 99p, 30d, EF 45-50%; b. 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   Complication of anesthesia    a.) delayed emergence; b.)  emergence delirium   Diverticulosis    HFrEF (heart failure with reduced ejection fraction) ([place])    a.) TEE 01/08/2020: EF 45-50%; b.) TTE 04/14/2022: EF 50-55%, septal HK, G2DD, GLS /18.3%, mild-mod MR, AoV sclerosis without stenosis.   Hyperlipidemia    Hypertension    Hyponatremia    IBS (irritable bowel syndrome)    Ischemic cardiomyopathy    a. 12/2019 TEE: EF 45-50%. Nl RV fxn. Mild MR.   NSTEMI (non-ST elevated myocardial infarction) ([place]) 01/07/2020   a.) LHC 01/08/2020: EF 45-50%, LVEDP 22 mmHg; 99% o-mLM, 80% OM1, 40% mLAD, 70% o-pRCA, 99% pRCA, 30% dRCA --> consult CVTS; b.) 4v CABG 01/07/2022: LIMA-LAD, SVG-RPDA, SVG-OM1, SVG-OM2.   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   Skin cancer    a.) s/p Mohs surgery   Statin intolerance     Assessment/Plan:   3 Days Post-Op Procedure(s) (LRB): TOTAL HIP ARTHROPLASTY ANTERIOR APPROACH (Right) Principal Problem:   Status post total hip replacement, right  Estimated body mass index is 22.7 kg/m as calculated from the following:   Height as of this encounter: '5\' 6"'$  (1.676 m).   Weight as  of this encounter: 63.8 kg. Advance diet Up with therapy Labs and VSS Pain controlled with tramadol and tylenol. Dc norco CM to assist with discharge to home with HHPT pending PT progress without dizziness.  DVT Prophylaxis - Lovenox, TED hose, and SCDS Weight-Bearing as tolerated to right leg   T. Rachelle Hora, PA-C [place] 07/17/2022, 7:18 AM

## 2022-07-17 NOTE — Progress Notes (Signed)
Physical Therapy Treatment Patient Details Name: [name] MRN: [id number] DOB: 04-26-1941 Today's Date: 07/17/2022   History of Present Illness Pt is an 81 yo male s/p R THA, anterior approach. PMH of HTN, CAD, CHF.    PT Comments    Up in chair.  Walked x 2 laps on small nursing pod with one seated rest break back in room.  Standing arom x 15.  To bathroom for BM.    Pt with frequent self initiated rest breaks with gait today but steady with no LOB or c/o dizziness.  He stated he feels stiff from not walking enough and did  not recall therapy sessions yesterday.  Reviewed.  He stated he felt comfortable going home today and wife in and supportive.   Recommendations for follow up therapy are one component of a multi-disciplinary discharge planning process, led by the attending physician.  Recommendations [place] be updated based on patient status, additional functional criteria and insurance authorization.  Follow Up Recommendations  Home health PT     Assistance Recommended at Discharge Set up Supervision/Assistance  Patient can return home with the following A little help with walking and/or transfers;A little help with bathing/dressing/bathroom;Assistance with cooking/housework;Assist for transportation;Help with stairs or ramp for entrance   Equipment Recommendations  None recommended by PT    Recommendations for Other Services       Precautions / Restrictions Precautions Precautions: Anterior Hip Precaution Booklet Issued: Yes (comment) Restrictions Weight Bearing Restrictions: Yes RLE Weight Bearing: Weight bearing as tolerated     Mobility  Bed Mobility               General bed mobility comments: in recliner pre/post session    Transfers Overall transfer level: Needs assistance Equipment used: Rolling walker (2 wheels) Transfers: Sit to/from Stand Sit to Stand: Min guard, Supervision                Ambulation/Gait Ambulation/Gait assistance:  Min guard Gait Distance (Feet): 120 Feet Assistive device: Rolling walker (2 wheels) Gait Pattern/deviations: Step-through pattern Gait velocity: decreased     General Gait Details: around small loop x 2 with seated break in room   Stairs             Wheelchair Mobility    Modified Rankin (Stroke Patients Only)       Balance Overall balance assessment: Modified Independent Sitting-balance support: Feet supported Sitting balance-Leahy Scale: Good     Standing balance support: Bilateral upper extremity supported, During functional activity Standing balance-Leahy Scale: Good                              Cognition Arousal/Alertness: Awake/alert Behavior During Therapy: WFL for tasks assessed/performed Overall Cognitive Status: Within Functional Limits for tasks assessed                                 General Comments: did not remember yesterday therapy sessions.        Exercises Other Exercises Other Exercises: standing arom x 10    General Comments        Pertinent Vitals/Pain Pain Assessment Pain Assessment: Faces Faces Pain Scale: Hurts little more Pain Location: R hip Pain Descriptors / Indicators: Aching, Sore, Grimacing Pain Intervention(s): Limited activity within patient's tolerance, Monitored during session, Repositioned    Home Living  Prior Function            PT Goals (current goals can now be found in the care plan section) Acute Rehab PT Goals Patient Stated Goal: go home Progress towards PT goals: Progressing toward goals    Frequency    BID      PT Plan Current plan remains appropriate    Co-evaluation              AM-PAC PT "6 Clicks" Mobility   Outcome Measure  Help needed turning from your back to your side while in a flat bed without using bedrails?: A Little Help needed moving from lying on your back to sitting on the side of a flat bed without  using bedrails?: A Little Help needed moving to and from a bed to a chair (including a wheelchair)?: A Little Help needed standing up from a chair using your arms (e.g., wheelchair or bedside chair)?: A Little Help needed to walk in hospital room?: A Little Help needed climbing 3-5 steps with a railing? : A Little 6 Click Score: 18    End of Session Equipment Utilized During Treatment: Gait belt Activity Tolerance: Patient tolerated treatment well Patient left: Other (comment) (IN BR with RN tech present) Nurse Communication: Mobility status PT Visit Diagnosis: Other abnormalities of gait and mobility (R26.89);Difficulty in walking, not elsewhere classified (R26.2);Muscle weakness (generalized) (M62.81);Pain Pain - Right/Left: Right Pain - part of body: Hip     Time: 0852-0930 PT Time Calculation (min) (ACUTE ONLY): 38 min  Charges:  $Gait Training: 23-37 mins $Therapeutic Exercise: 8-22 mins                   Chesley Noon, PTA 07/17/22, 9:43 AM

## 2022-08-03 ENCOUNTER — Encounter: Payer: Self-pay | Admitting: Cardiology

## 2022-08-03 ENCOUNTER — Ambulatory Visit: Payer: Medicare PPO | Admitting: Cardiology

## 2022-08-03 VITALS — BP 124/50 | HR 59 | Ht 66.0 in | Wt 140.0 lb

## 2022-08-03 DIAGNOSIS — Z951 Presence of aortocoronary bypass graft: Secondary | ICD-10-CM | POA: Diagnosis not present

## 2022-08-03 DIAGNOSIS — E785 Hyperlipidemia, unspecified: Secondary | ICD-10-CM

## 2022-08-03 DIAGNOSIS — I10 Essential (primary) hypertension: Secondary | ICD-10-CM | POA: Diagnosis not present

## 2022-08-03 NOTE — Progress Notes (Signed)
Cardiology Office Note:    Date:  08/03/2022   ID:  Maudie Flakes Traughber, DOB Biancardi 07, 1942, MRN [id number]  PCP:  Venia Carbon, MD  Cardiologist:  Kate Sable, MD  Electrophysiologist:  None   Referring MD: Venia Carbon, MD   Chief Complaint  Patient presents with   Follow-up    No new cardiac concerns    History of Present Illness:    [name] is a 81 y.o. male with a hx of CAD, s/p CABG x4 in January 2021, hyperlipidemia, heart failure reduced ejection fraction, EF 45 to 50% (normalized to 50-55%), hypertension, hyperlipidemia, postop aflutter who presents for follow-up.  Previously seen for preop evaluation prior to right hip surgery.  Underwent surgery successfully, right hip limited by postop pain.  Currently undergoing physical therapy, denies any chest pain, blood pressure adequately controlled.  No current concerns, takes medications as prescribed  Historical notes Echo 03/2022 EF 50 to 55% originally seen in January 2021 with chest pain/NSTEMI.  Work-up via left heart cath revealed multivessel CAD.  He subsequently underwent CABG x4.  Last admitted a month ago when he developed chest pain and presented to the emergency room.  He was found to be in atrial flutter with RVR.  He was started on Lopressor and Xarelto.  Patient has history of easy bruising on Xarelto. He was taking Xarelto due to postop atrial flutter. He hasn't had atrial flutter since surgery. Xarelto was stopped    He has not been able to tolerate Lipitor, simvastatin, or Crestor.  Tolerating Zetia and lovastatin. Unable to tolerate lisinopril, losartan.   Past Medical History:  Diagnosis Date   Arthritis    Bilateral pleural effusion    Bone spur 2010   right shoulder   CAD (coronary artery disease)    a. 12/2019 NSTEMI/Cath: LM 99ost/m, LAD 40, LCX nl, OM1 80, RCA 70ost/p, 99p, 30d, EF 45-50%; b. 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   Complication of anesthesia    a.) delayed  emergence; b.) emergence delirium   Diverticulosis    HFrEF (heart failure with reduced ejection fraction) ([place])    a.) TEE 01/08/2020: EF 45-50%; b.) TTE 04/14/2022: EF 50-55%, septal HK, G2DD, GLS /18.3%, mild-mod MR, AoV sclerosis without stenosis.   Hyperlipidemia    Hypertension    Hyponatremia    IBS (irritable bowel syndrome)    Ischemic cardiomyopathy    a. 12/2019 TEE: EF 45-50%. Nl RV fxn. Mild MR.   NSTEMI (non-ST elevated myocardial infarction) ([place]) 01/07/2020   a.) LHC 01/08/2020: EF 45-50%, LVEDP 22 mmHg; 99% o-mLM, 80% OM1, 40% mLAD, 70% o-pRCA, 99% pRCA, 30% dRCA --> consult CVTS; b.) 4v CABG 01/07/2022: LIMA-LAD, SVG-RPDA, SVG-OM1, SVG-OM2.   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   Skin cancer    a.) s/p Mohs surgery   Statin intolerance     Past Surgical History:  Procedure Laterality Date   BACK SURGERY     Lumbar   BUNIONECTOMY     CATARACT EXTRACTION, BILATERAL     COLONOSCOPY     CORONARY ARTERY BYPASS GRAFT N/A 01/08/2020   Procedure: CORONARY ARTERY BYPASS GRAFTING (CABG) x 4  WITH ENDOSCOPIC HARVESTING OF BILATERAL GREATER SAPHENOUS VEINS;  Surgeon: Ivin Poot, MD;  Location: [facility];  Service: Open Heart  Surgery;  Laterality: N/A;   HEMORRHOID SURGERY  2004   INGUINAL HERNIA REPAIR  01/2010   LEFT HEART CATH AND CORONARY ANGIOGRAPHY N/A 01/08/2020   Procedure: LEFT HEART CATH AND CORONARY ANGIOGRAPHY;  Surgeon: Wellington Hampshire, MD;  Location: [facility];  Service: Cardiovascular;  Laterality: N/A;   ROTATOR CUFF REPAIR Left 06/2010   left/ bone spur Dr.Blackman   SHOULDER ARTHROSCOPY WITH OPEN ROTATOR CUFF REPAIR Right 05/26/2018   Procedure: SHOULDER ARTHROSCOPY WITH OPEN ROTATOR CUFF REPAIR;  Surgeon: Corky Mull, MD;  Location: [facility];  Service:  Orthopedics;  Laterality: Right;  debridement, decompression   SKIN CANCER EXCISION  2003-2005   Mohs-right shoulder   TEE WITHOUT CARDIOVERSION N/A 01/08/2020   Procedure: TRANSESOPHAGEAL ECHOCARDIOGRAM (TEE);  Surgeon: Prescott Gum, Collier Salina, MD;  Location: [facility];  Service: Open Heart Surgery;  Laterality: N/A;   TOTAL HIP ARTHROPLASTY Right 07/14/2022   Procedure: TOTAL HIP ARTHROPLASTY ANTERIOR APPROACH;  Surgeon: Hessie Knows, MD;  Location: [facility];  Service: Orthopedics;  Laterality: Right;    Current Medications: Current Meds  Medication Sig   amLODipine (NORVASC) 10 MG tablet Take 1 tablet (10 mg total) by mouth daily.   aspirin EC 81 MG tablet Take 81 mg by mouth daily.   Calcium Carbonate Antacid (CALCIUM CARBONATE PO) Take 400 mg by mouth daily.   docusate sodium (COLACE) 100 MG capsule Take 1 capsule (100 mg total) by mouth 2 (two) times daily.   enoxaparin (LOVENOX) 40 MG/0.4ML injection Inject 0.4 mLs (40 mg total) into the skin daily for 14 days.   Evolocumab (REPATHA SURECLICK) 170 MG/ML SOAJ Inject 1 pen  into the skin every 14 (fourteen) days.   Magnesium 200 MG TABS Take 1 tablet by mouth daily.   methocarbamol (ROBAXIN) 500 MG tablet Take 1 tablet (500 mg total) by mouth every 6 (six) hours as needed for muscle spasms.   metoprolol succinate (TOPROL-XL) 25 MG 24 hr tablet Take 0.5 tablets (12.5 mg total) by mouth daily. Take with or immediately following a meal.   Omega-3 Fatty Acids (FISH OIL) 1000 MG CAPS Take 1 capsule by mouth daily.   Psyllium (METAMUCIL PO) Take 1 Dose by mouth at bedtime. Mix with water   traMADol (ULTRAM) 50 MG tablet Take 1 tablet (50 mg total) by mouth every 6 (six) hours.     Allergies:   Crestor [rosuvastatin], Penicillins, Peanut-containing drug products, Atorvastatin, and Simvastatin   Social History   Socioeconomic History   Marital status: Married    Spouse name: Not on file   Number of children: 2   Years of education: Not on file    Highest education level: Not on file  Occupational History   Occupation: retired Merchandiser, retail: retired  Tobacco Use   Smoking status: Never   Smokeless tobacco: Never  Vaping Use   Vaping Use: Never used  Substance and Sexual Activity   Alcohol use: Yes    Alcohol/week: 1.0 standard drink of alcohol    Types: 1 Glasses of wine per week    Comment: rare   Drug use: No   Sexual activity: Not on file  Other Topics Concern   Not on file  Social History Narrative   2 sons, no contact   Splits his time between [place] and [place]   Has live-in since 2005   Has living will   Requests Bernice--girlfriend or brother Herbie [place], as health care POA  Would accept resuscitation but no prolonged artificial ventilation   Wouldn't want tube feeds if cognitively unaware   Social Determinants of Health   Financial Resource Strain: Not on file  Food Insecurity: Not on file  Transportation Needs: Not on file  Physical Activity: Not on file  Stress: Not on file  Social Connections: Not on file     Family History: The patient's family history includes Heart attack in his father; Heart disease in his brother; Stroke in his brother. There is no history of Cancer, Diabetes, Colon cancer, Esophageal cancer, Stomach cancer, or Pancreatic cancer.  ROS:   Please see the history of present illness.     All other systems reviewed and are negative.  EKGs/Labs/Other Studies Reviewed:    The following studies were reviewed today:    EKG:  EKG is  ordered today.  The ekg ordered today demonstrates sinus bradycardia heart rate 59 Recent Labs: 07/03/2022: ALT 22 07/15/2022: BUN 10; Creatinine, Ser 0.83; Potassium 4.5; Sodium 130 07/16/2022: Hemoglobin 9.5; Platelets 200  Recent Lipid Panel    Component Value Date/Time   CHOL 120 08/09/2020 0733   TRIG 77 08/09/2020 0733   HDL 44 08/09/2020 0733   CHOLHDL 2.7 08/09/2020 0733   VLDL 15 08/09/2020 0733   LDLCALC  61 08/09/2020 0733   LDLDIRECT 188.8 04/05/2012 1254    Physical Exam:    VS:  BP (!) 124/50 (BP Location: Left Arm, Patient Position: Sitting, Cuff Size: Normal)   Pulse (!) 59   Ht '5\' 6"'$  (1.676 m)   Wt 140 lb (63.5 kg)   SpO2 99%   BMI 22.60 kg/m     Wt Readings from Last 3 Encounters:  08/03/22 140 lb (63.5 kg)  07/14/22 140 lb 10.5 oz (63.8 kg)  07/03/22 140 lb 10.5 oz (63.8 kg)     GEN:  Well nourished, well developed in no acute distress HEENT: Normal NECK: No JVD; No carotid bruits CARDIAC: RRR, no murmurs, rubs, gallops RESPIRATORY:  Clear to auscultation without rales, wheezing or rhonchi  ABDOMEN: Soft, non-tender, non-distended MUSCULOSKELETAL:  No edema; right leg and hip tenderness with movement SKIN: Warm and dry NEUROLOGIC:  Alert and oriented x 3 PSYCHIATRIC:  Normal affect   ASSESSMENT:    1. Hx of CABG   2. Hyperlipidemia LDL goal <70   3. Primary hypertension    PLAN:    In order of problems listed above:  S/p CABG x 4.  .continue aspirin, Repatha, Toprol-XL 12.5 mg daily.  Echo with improved EF, normal EF 50 to 55% . hyperlipidemia, CAD. LDL at goal .  Continue Repatha. Hypertension, BP controlled.  Norvasc 10, Toprol-XL  Follow-up yearly  Total encounter time 35 minutes  Greater than 50% was spent in counseling and coordination of care with the patient  Medication Adjustments/Labs and Tests Ordered: Current medicines are reviewed at length with the patient today.  Concerns regarding medicines are outlined above.  Orders Placed This Encounter  Procedures   EKG 12-Lead   No orders of the defined types were placed in this encounter.   Patient Instructions  Medication Instructions:   Your physician recommends that you continue on your current medications as directed. Please refer to the Current Medication list given to you today.  *If you need a refill on your cardiac medications before your next appointment, please call your  pharmacy*     Follow-Up: At [facility], you and your health needs are our priority.  As part of our  continuing mission to provide you with exceptional heart care, we have created designated Provider Care Teams.  These Care Teams include your primary Cardiologist (physician) and Advanced Practice Providers (APPs -  Physician Assistants and Nurse Practitioners) who all work together to provide you with the care you need, when you need it.  We recommend signing up for the patient portal called "MyChart".  Sign up information is provided on this After Visit Summary.  MyChart is used to connect with patients for Virtual Visits (Telemedicine).  Patients are able to view lab/test results, encounter notes, upcoming appointments, etc.  Non-urgent messages can be sent to your provider as well.   To learn more about what you can do with MyChart, go to [email or web address].    Your next appointment:   1 year(s)  The format for your next appointment:   In Person  Provider:   Kate Sable, MD    Other Instructions   Important Information About Sugar         Signed, Kate Sable, MD  08/03/2022 9:44 AM    [place]

## 2022-08-03 NOTE — Patient Instructions (Addendum)
Medication Instructions:   Your physician recommends that you continue on your current medications as directed. Please refer to the Current Medication list given to you today.   *If you need a refill on your cardiac medications before your next appointment, please call your pharmacy*  Follow-Up: At [facility], you and your health needs are our priority.  As part of our continuing mission to provide you with exceptional heart care, we have created designated Provider Care Teams.  These Care Teams include your primary Cardiologist (physician) and Advanced Practice Providers (APPs -  Physician Assistants and Nurse Practitioners) who all work together to provide you with the care you need, when you need it.  We recommend signing up for the patient portal called "MyChart".  Sign up information is provided on this After Visit Summary.  MyChart is used to connect with patients for Virtual Visits (Telemedicine).  Patients are able to view lab/test results, encounter notes, upcoming appointments, etc.  Non-urgent messages can be sent to your provider as well.   To learn more about what you can do with MyChart, go to [email or web address].    Your next appointment:   1 year(s)  The format for your next appointment:   In Person  Provider:   Brian Agbor-Etang, MD    Other Instructions   Important Information About Sugar       

## 2022-09-30 ENCOUNTER — Other Ambulatory Visit: Payer: Self-pay

## 2022-09-30 MED ORDER — AMLODIPINE BESYLATE 10 MG PO TABS: 10.0000 mg | ORAL_TABLET | Freq: Every day | ORAL | 1 refills | Status: DC

## 2022-10-26 ENCOUNTER — Other Ambulatory Visit: Payer: Self-pay

## 2022-10-26 DIAGNOSIS — E785 Hyperlipidemia, unspecified: Secondary | ICD-10-CM

## 2022-10-26 MED ORDER — REPATHA SURECLICK 140 MG/ML ~~LOC~~ SOAJ: 1.0000 | SUBCUTANEOUS | 8 refills | Status: DC

## 2023-05-04 ENCOUNTER — Telehealth: Payer: Self-pay | Admitting: Cardiology

## 2023-05-04 ENCOUNTER — Encounter: Payer: Self-pay | Admitting: Internal Medicine

## 2023-05-04 NOTE — Telephone Encounter (Signed)
Pt c/o medication issue:  1. Name of Medication: amLODipine (NORVASC) 10 MG tablet   2. How are you currently taking this medication (dosage and times per day)?   Take 1 tablet (10 mg total) by mouth daily.    3. Are you having a reaction (difficulty breathing--STAT)? No  4. What is your medication issue? Patient's is having dizzy spells and the patient's legs are swelling from this medication. Please advise.

## 2023-05-04 NOTE — Telephone Encounter (Signed)
error 

## 2023-05-04 NOTE — Telephone Encounter (Signed)
Spoke with patients wife and reviewed symptoms with her. She states he was having dizziness and swelling to his legs. They feel it is from his amlodipine. Advised that I would forward this information to the provider for review and any recommendations. She verbalized understanding with no further questions at this time.

## 2023-05-06 MED ORDER — LOSARTAN POTASSIUM 50 MG PO TABS: 50.0000 mg | ORAL_TABLET | Freq: Every day | ORAL | 3 refills | Status: DC

## 2023-05-06 NOTE — Telephone Encounter (Signed)
Spoke with patients wife and reviewed provider recommendations. She verbalized understanding with no further questions.

## 2023-05-06 NOTE — Telephone Encounter (Signed)
Left voicemail message to call back for review of results.    Message Received: Today Debbe Odea, MD   Okay to stop amlodipine, start losartan 50 mg daily for BP control.

## 2023-05-06 NOTE — Telephone Encounter (Signed)
Patient's wife returning call. 

## 2023-06-28 ENCOUNTER — Other Ambulatory Visit: Payer: Self-pay

## 2023-06-28 MED ORDER — METOPROLOL SUCCINATE ER 25 MG PO TB24: 12.5000 mg | ORAL_TABLET | Freq: Every day | ORAL | 0 refills | Status: DC

## 2023-08-03 ENCOUNTER — Telehealth: Payer: Self-pay | Admitting: Cardiology

## 2023-08-03 MED ORDER — METOPROLOL SUCCINATE ER 25 MG PO TB24: 12.5000 mg | ORAL_TABLET | Freq: Every day | ORAL | 0 refills | Status: DC

## 2023-08-03 NOTE — Telephone Encounter (Signed)
*  STAT* If patient is at the pharmacy, call can be transferred to refill team.   1. Which medications need to be refilled? (please list name of each medication and dose if known)   metoprolol succinate (TOPROL-XL) 25 MG 24 hr tablet    2. Which pharmacy/location (including street and city if local pharmacy) is medication to be sent to? [id number] - [place], [place] - [address] OF [address]   3. Do they need a 30 day or 90 day supply? 90

## 2023-09-01 ENCOUNTER — Encounter: Payer: Self-pay | Admitting: Cardiology

## 2023-09-01 ENCOUNTER — Ambulatory Visit: Payer: Medicare FFS | Attending: Cardiology | Admitting: Cardiology

## 2023-09-01 VITALS — BP 148/62 | HR 58 | Ht 66.0 in | Wt 133.4 lb

## 2023-09-01 DIAGNOSIS — I5032 Chronic diastolic (congestive) heart failure: Secondary | ICD-10-CM

## 2023-09-01 DIAGNOSIS — E785 Hyperlipidemia, unspecified: Secondary | ICD-10-CM | POA: Diagnosis not present

## 2023-09-01 DIAGNOSIS — I10 Essential (primary) hypertension: Secondary | ICD-10-CM | POA: Diagnosis not present

## 2023-09-01 DIAGNOSIS — I255 Ischemic cardiomyopathy: Secondary | ICD-10-CM

## 2023-09-01 DIAGNOSIS — I25118 Atherosclerotic heart disease of native coronary artery with other forms of angina pectoris: Secondary | ICD-10-CM | POA: Diagnosis not present

## 2023-09-01 DIAGNOSIS — Z951 Presence of aortocoronary bypass graft: Secondary | ICD-10-CM

## 2023-09-01 MED ORDER — LOSARTAN POTASSIUM 50 MG PO TABS
50.0000 mg | ORAL_TABLET | Freq: Every day | ORAL | 3 refills | Status: DC
Start: 2023-09-01 — End: 2023-09-10

## 2023-09-01 MED ORDER — REPATHA SURECLICK 140 MG/ML ~~LOC~~ SOAJ: 1.0000 | SUBCUTANEOUS | 11 refills | Status: DC

## 2023-09-01 MED ORDER — METOPROLOL SUCCINATE ER 25 MG PO TB24: 12.5000 mg | ORAL_TABLET | Freq: Every day | ORAL | 3 refills | Status: DC

## 2023-09-01 NOTE — Patient Instructions (Signed)
Medication Instructions:  No changes *If you need a refill on your cardiac medications before your next appointment, please call your pharmacy*   Lab Work: Your provider would like for you to have following labs drawn today Direct LDL, CBC, BMET.   If you have labs (blood work) drawn today and your tests are completely normal, you will receive your results only by: MyChart Message (if you have MyChart) OR A paper copy in the mail If you have any lab test that is abnormal or we need to change your treatment, we will call you to review the results.   Testing/Procedures: None ordered   Follow-Up: At [facility], you and your health needs are our priority.  As part of our continuing mission to provide you with exceptional heart care, we have created designated Provider Care Teams.  These Care Teams include your primary Cardiologist (physician) and Advanced Practice Providers (APPs -  Physician Assistants and Nurse Practitioners) who all work together to provide you with the care you need, when you need it.  We recommend signing up for the patient portal called "MyChart".  Sign up information is provided on this After Visit Summary.  MyChart is used to connect with patients for Virtual Visits (Telemedicine).  Patients are able to view lab/test results, encounter notes, upcoming appointments, etc.  Non-urgent messages can be sent to your provider as well.   To learn more about what you can do with MyChart, go to [email or web address].    Your next appointment:   12 month(s)  Provider:   You [place] see Debbe Odea, MD or one of the following Advanced Practice Providers on your designated Care Team:   Nicolasa Ducking, NP Eula Listen, PA-C Cadence Fransico Michael, PA-C Charlsie Quest, NP

## 2023-09-01 NOTE — Progress Notes (Signed)
Cardiology Office Note:  .   Date:  09/01/2023  ID:  [name], DOB [birth year], MRN [id number] PCP: Karie Schwalbe, MD  [facility] Providers Cardiologist:  Debbe Odea, MD    History of Present Illness: .   [name] is a 82 y.o. 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  [name] regimen is last on 01/07/2020 with an NSTEMI with high-sensitivity troponin peaking at 58 of 7.  Left heart cath showed severe left main, OM, and RCA disease.  He was transferred to [facility] for emergent bypass surgery.  He underwent four-vessel CABG of 01/08/2020 with LIMA to LAD, SVG to RPDA, SVG to OM1 and OM 2.  Perioperative TEE showed an EF of 45-50%.  Postoperatively he had anemia 7.8 but did not require blood transfusion.  Follow-up hospital visit on 2/24 he had complained about intermittent palpitations mostly at night.   On 2/9 chest pain following increasing stress he was brought to the [facility] emergency department he was noted to be in new onset atrial flutter with RVR with a 2-1 AV block.  He was given IV diltiazem 10 mg x 1 along with IV Ativan 0.5 mg x 1 with spontaneous conversion to sinus rhythm.  Labs revealed COVID-19 was negative high-sensitivity troponin is 31 with a delta 32, BNP 458, chest x-ray showed bilateral pleural effusions that improved with moderate residual pleural fluid at the left lung base with associated atelectasis.  He was continued on PTA Toprol-XL and full dose aspirin.  Early the next morning he redeveloped atrial flutter with RVR and remained in the rhythm at the time with ventricular rates of 80s to 120s.  He also continued to complain of intermittent palpitations.  Medications were  titrated and he was considered stable for discharge on 02/01/2020.  He was last seen in clinic 08/03/2022 by Dr.Agbor-Etang.  He had recently undergone a right hip surgery.  Surgery was successful he was currently undergoing physical therapy but denied any anginal or anginal equivalents.  He was continued on Repatha no other changes were made to his medications and no further testing was ordered at that time.  He returns to clinic today accompanied by his wife. He states that he has been doing well from the cardiac perspective.  Denies any chest pain, shortness of breath, palpitations, and peripheral edema that has improved after recent blood pressure medication changes.  Patient states he has not had any visits to the hospital or the emergency department.  He has been compliant with his current medication regimen.  He and his wife have been on several trips thus far this year and getting ready to head back to [place] for the winter months.  ROS: 10 point review of systems has been reviewed and considered negative with exception of what is been listed in the HPI  Studies Reviewed: Marland Kitchen   EKG Interpretation Date/Time:  Wednesday September 01 2023 09:18:42 EDT Ventricular Rate:  58 PR Interval:  194 QRS Duration:  136 QT Interval:  470 QTC Calculation: 461 R Axis:   28  Text Interpretation: Sinus bradycardia Right bundle branch block When compared with ECG of 12-Aug-2020 08:09, Confirmed by Charlsie Quest ([id number]) on 09/01/2023 9:27:33 AM   TTE 04/14/22  1. Left ventricular ejection  fraction, by estimation, is 50 to 55%. The  left ventricle has low normal function. The left ventricle demonstrates  regional wall motion abnormalities (Hypokinesis of the septal wall,  possibly post surgical etiology). Left  ventricular diastolic parameters are consistent with Grade II diastolic  dysfunction (pseudonormalization). The average left ventricular global  longitudinal strain is -18.3 %.   2. Right  ventricular systolic function is normal. The right ventricular  size is normal. There is normal pulmonary artery systolic pressure. The  estimated right ventricular systolic pressure is 32.9 mmHg.   3. The mitral valve is normal in structure. Mild to moderate mitral valve  regurgitation. No evidence of mitral stenosis.   4. The aortic valve is normal in structure. Aortic valve regurgitation is  not visualized. Aortic valve sclerosis is present, with no evidence of  aortic valve stenosis.   5. The inferior vena cava is normal in size with greater than 50%  respiratory variability, suggesting right atrial pressure of 3 mmHg.   LHC 01/08/2020: Ost LM to Mid LM lesion is 99% stenosed. 1st Mrg lesion is 80% stenosed. Mid LAD lesion is 40% stenosed. LV end diastolic pressure is moderately elevated. The left ventricular ejection fraction is 45-50% by visual estimate. There is mild left ventricular systolic dysfunction. Prox RCA lesion is 99% stenosed. Ost RCA to Prox RCA lesion is 70% stenosed. Dist RCA lesion is 30% stenosed.   1.  Critical left main and RCA disease. 2.  Mildly reduced LV systolic function with an EF of 45%. 3.  Moderately elevated left ventricular end-diastolic pressure.   Recommendations:   Recommend emergent CABG.  I discussed with Dr. Donata Clay and the patient will be transferred to [facility]. __________   Intraoperative TEE 01/08/2020: POST-OP IMPRESSIONS  - Left Ventricle: The left ventricle is unchanged from pre-bypass.  - Aorta: The aorta appears unchanged from pre-bypass.  - Aortic Valve: The aortic valve appears unchanged from pre-bypass.  - Mitral Valve: The mitral valve appears unchanged from pre-bypass.   PRE-OP FINDINGS   Left Ventricle: The left ventricle has mildly reduced systolic function,  with an ejection fraction of 45-50%. The cavity size was mildly dilated.  There is mildly increased left ventricular wall thickness.  Risk  Assessment/Calculations:    CHA2DS2-VASc Score = 5   This indicates a 7.2% annual risk of stroke. The patient's score is based upon: CHF History: 1 HTN History: 1 Diabetes History: 0 Stroke History: 0 Vascular Disease History: 1 Age Score: 2 Gender Score: 0    HYPERTENSION CONTROL Vitals:   09/01/23 0905 09/01/23 0920  BP: (!) 155/64 (!) 148/62    The patient's blood pressure is elevated above target today.  In order to address the patient's elevated BP: Blood pressure will be monitored at home to determine if medication changes need to be made. (Blood pressure log from home reveals blood pressures 120s 130s systolic)          Physical Exam:   VS:  BP (!) 148/62 (BP Location: Left Arm, Patient Position: Sitting, Cuff Size: Normal)   Pulse (!) 58   Ht 5\' 6"  (1.676 m)   Wt 133 lb 6.4 oz (60.5 kg)   SpO2 98%   BMI 21.53 kg/m    Wt Readings from Last 3 Encounters:  09/01/23 133 lb 6.4 oz (60.5 kg)  08/03/22 140 lb (63.5 kg)  07/14/22 140 lb 10.5 oz (63.8 kg)    GEN: Well nourished, well developed in no acute distress NECK: No  JVD; No carotid bruits CARDIAC: RRR, no murmurs, rubs, gallops RESPIRATORY:  Clear to auscultation without rales, wheezing or rhonchi  ABDOMEN: Soft, non-tender, non-distended EXTREMITIES:  No edema; No deformity   ASSESSMENT AND PLAN: .   Coronary artery disease status post CABG x 4 vessel without angina.  EKG today reveals sinus rhythm without any concern for ischemic changes.  He denies any anginal or anginal equivalents.  He is continued on aspirin 81 mg daily and Repatha 140 mg every 2 weeks as he has statin intolerance.  He is also being sent today for CBC and BMP.  Primary hypertension with a blood pressure 155/64 recheck of 148/62 in the home log revealing systolic blood pressures of 120s to 130s.  He was recently changed from amlodipine to losartan blood pressure has been very well-controlled at home.  He has also continued on Toprol-XL 12.5  mg daily.  He has been encouraged to continue to monitor his blood pressures 1 to 2 hours post medication administration.  Hyperlipidemia where he is continued on Repatha 140 mg as well as omega-3 fish oil.  He has been sent for direct LDL and lipid panel today for updated results.  He is requesting that once his labs resulted they be mailed to his home in [place] at [address], [place], [place] [address]  HFimpEF/ischemic cardiomyopathy with last LVEF 50-55% on echocardiogram.  Patient is euvolemic on exam today.  He is continued on ARB and beta-blocker therapy.       Dispo: Patient to return to clinic to see MD/APP in 1 year or sooner if needed.  Labs have been ordered today.  Refills have been sent into the pharmacy of patient's preference.  Signed, Zyriah Mask, NP

## 2023-09-02 LAB — BASIC METABOLIC PANEL
BUN/Creatinine Ratio: 8 — ABNORMAL LOW (ref 10–24)
BUN: 7 mg/dL — ABNORMAL LOW (ref 8–27)
CO2: 23 mmol/L (ref 20–29)
Calcium: 9.3 mg/dL (ref 8.6–10.2)
Chloride: 96 mmol/L (ref 96–106)
Creatinine, Ser: 0.9 mg/dL (ref 0.76–1.27)
Glucose: 95 mg/dL (ref 70–99)
Potassium: 4.1 mmol/L (ref 3.5–5.2)
Sodium: 133 mmol/L — ABNORMAL LOW (ref 134–144)
eGFR: 86 mL/min/{1.73_m2} (ref >59)

## 2023-09-02 LAB — CBC
Hematocrit: 42.1 % (ref 37.5–51.0)
Hemoglobin: 13.8 g/dL (ref 13.0–17.7)
MCH: 29.7 pg (ref 26.6–33.0)
MCHC: 32.8 g/dL (ref 31.5–35.7)
MCV: 91 fL (ref 79–97)
Platelets: 210 10*3/uL (ref 150–450)
RBC: 4.65 x10E6/uL (ref 4.14–5.80)
RDW: 11.9 % (ref 11.6–15.4)
WBC: 5.7 10*3/uL (ref 3.4–10.8)

## 2023-09-02 LAB — LDL CHOLESTEROL, DIRECT: LDL Direct: 104 mg/dL — ABNORMAL HIGH (ref 0–99)

## 2023-09-03 NOTE — Progress Notes (Signed)
Blood counts are stable with no elevated white count indicating concerns for infection.  LDL is improved.  Kidney function is stable.  Sodium level is slightly decreased.  Remain hydrated but not overdo it with free water as this washes sodium out of your bloodstream.  Please mail patient a hard copy of his results to his address in [place] listed in the note.

## 2023-09-10 ENCOUNTER — Other Ambulatory Visit: Payer: Self-pay

## 2023-09-10 ENCOUNTER — Telehealth: Payer: Self-pay | Admitting: Cardiology

## 2023-09-10 MED ORDER — AMLODIPINE BESYLATE 5 MG PO TABS: 5.0000 mg | ORAL_TABLET | Freq: Every day | ORAL | 3 refills | Status: DC

## 2023-09-10 NOTE — Telephone Encounter (Signed)
Pt c/o medication issue:  1. Name of Medication:   losartan (COZAAR) 50 MG tablet   2. How are you currently taking this medication (dosage and times per day)?   As prescribed  3. Are you having a reaction (difficulty breathing--STAT)?   4. What is your medication issue?   Wife [name]) stated patient has been taking this medication and it had been working OK but now he's been having dizzy spells, a runny nose and a cough.  Wife wants to know next steps.

## 2023-09-10 NOTE — Telephone Encounter (Signed)
Spoke with Patient and reviewed Dr. Azucena Cecil recommendations as followed  "Patient was previously on Norvasc.  Recommend stopping losartan, restart Norvasc 5 mg daily.  Continue metoprolol."  Medication list updated.

## 2023-09-10 NOTE — Telephone Encounter (Signed)
Spoke w/ pt and his wife on speaker phone. He reports that he was doing great w/ improved BP at last ov, but was just beginning to have side effects from Cozaar and they were not bad enough at that time to mention. He reports that in the last couple of weeks, his BP has been running 130-150s/50-60s, HR in 50s (MD wanted them above 45). He reports that they have worsened and are now almost unbearable. He reports frequent dizziness, has fallen twice in the past week (does not have readings when symptomatic). He states that his nose runs constantly, like turning on a spigot, clear fluid that is not alleviated w/ blowing his nose. Drainage down the back of his throat is causing cough and wife reports he wheezes at night. Pt reports that he is very active and does not want to wait on his body to get used to the med, as he is afraid he will get hurt outside doing yard work. He was previously on amlodipine, but this was stopped 2/2 leg swelling, but he states that was more tolerable than the dizziness he is experiencing now. Pt would like to switch to a different med and states that he will not take cozaar anymore, but will continue taking his metoprolol. They are currently in [facility] until April and would like any rxs sent to [facility] on [address]. Advised them that I will send their concerns to Dr. Azucena Cecil and call them back w/ his recommendation.

## 2024-04-13 ENCOUNTER — Ambulatory Visit (INDEPENDENT_AMBULATORY_CARE_PROVIDER_SITE_OTHER): Admitting: Urology

## 2024-04-13 VITALS — BP 148/66 | HR 59 | Ht 67.0 in | Wt 130.0 lb

## 2024-04-13 DIAGNOSIS — N529 Male erectile dysfunction, unspecified: Secondary | ICD-10-CM | POA: Diagnosis not present

## 2024-04-13 DIAGNOSIS — N5089 Other specified disorders of the male genital organs: Secondary | ICD-10-CM

## 2024-04-13 LAB — URINALYSIS, COMPLETE
Bilirubin, UA: NEGATIVE
Glucose, UA: NEGATIVE
Ketones, UA: NEGATIVE
Leukocytes,UA: NEGATIVE
Nitrite, UA: NEGATIVE
Protein,UA: NEGATIVE
RBC, UA: NEGATIVE
Specific Gravity, UA: 1.01 (ref 1.005–1.030)
Urobilinogen, Ur: 0.2 mg/dL (ref 0.2–1.0)
pH, UA: 6.5 (ref 5.0–7.5)

## 2024-04-13 LAB — MICROSCOPIC EXAMINATION
Bacteria, UA: NONE SEEN
WBC, UA: NONE SEEN /HPF (ref 0–5)

## 2024-04-13 MED ORDER — TADALAFIL 10 MG PO TABS: 10.0000 mg | ORAL_TABLET | Freq: Every day | ORAL | 11 refills | Status: AC | PRN

## 2024-04-13 NOTE — Patient Instructions (Signed)
 Scheduled [phone]

## 2024-04-13 NOTE — Progress Notes (Signed)
 04/13/24 12:57 PM   [name] January 05, 1941 [id number]  CC: Right scrotal swelling, ED  HPI: 83 year old male who reportedly underwent a right hydrocelectomy in March 2022 in [place]  and has had persistent scrotal swelling since that time.  He does think this is will much worse over the last 6 months.  There is no imaging this been performed.  He reports he had a prolonged postop course with 8 to 10 weeks of healing time.  He denies any urinary symptoms.  He also has ED and is interested in treatment options.   PMH: Past Medical History:  Diagnosis Date   Arthritis    Bilateral pleural effusion    Bone spur 2010   right shoulder   CAD (coronary artery disease)    a. 12/2019 NSTEMI/Cath: LM 99ost/m, LAD 40, LCX nl, OM1 80, RCA 70ost/p, 99p, 30d, EF 45-50%; b. 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   Complication of anesthesia    a.) delayed emergence; b.) emergence delirium   Diverticulosis    HFrEF (heart failure with reduced ejection fraction) (HCC)    a.) TEE 01/08/2020: EF 45-50%; b.) TTE 04/14/2022: EF 50-55%, septal HK, G2DD, GLS /18.3%, mild-mod MR, AoV sclerosis without stenosis.   Hyperlipidemia    Hypertension    Hyponatremia    IBS (irritable bowel syndrome)    Ischemic cardiomyopathy    a. 12/2019 TEE: EF 45-50%. Nl RV fxn. Mild MR.   NSTEMI (non-ST elevated myocardial infarction) (HCC) 01/07/2020   a.) LHC 01/08/2020: EF 45-50%, LVEDP 22 mmHg; 99% o-mLM, 80% OM1, 40% mLAD, 70% o-pRCA, 99% pRCA, 30% dRCA --> consult CVTS; b.) 4v CABG 01/07/2022: LIMA-LAD, SVG-RPDA, SVG-OM1, SVG-OM2.   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   Skin cancer    a.) s/p Mohs surgery   Statin intolerance     Surgical History: Past Surgical History:  Procedure Laterality Date   BACK  SURGERY     Lumbar   BUNIONECTOMY     CATARACT EXTRACTION, BILATERAL     COLONOSCOPY     CORONARY ARTERY BYPASS GRAFT N/A 01/08/2020   Procedure: CORONARY ARTERY BYPASS GRAFTING (CABG) x 4  WITH ENDOSCOPIC HARVESTING OF BILATERAL GREATER SAPHENOUS VEINS;  Surgeon: Heriberto London, MD;  Location: [facility] OR;  Service: Open Heart Surgery;  Laterality: N/A;   HEMORRHOID SURGERY  2004   INGUINAL HERNIA REPAIR  01/2010   LEFT HEART CATH AND CORONARY ANGIOGRAPHY N/A 01/08/2020   Procedure: LEFT HEART CATH AND CORONARY ANGIOGRAPHY;  Surgeon: Wenona Hamilton, MD;  Location: [facility] INVASIVE CV LAB;  Service: Cardiovascular;  Laterality: N/A;   ROTATOR CUFF REPAIR Left 06/2010   left/ bone spur Dr.Blackman   SHOULDER ARTHROSCOPY WITH OPEN ROTATOR CUFF REPAIR Right 05/26/2018   Procedure: SHOULDER ARTHROSCOPY WITH OPEN ROTATOR CUFF REPAIR;  Surgeon: Elner Hahn, MD;  Location: [facility] ORS;  Service: Orthopedics;  Laterality: Right;  debridement, decompression   SKIN CANCER EXCISION  2003-2005   Mohs-right shoulder   TEE WITHOUT CARDIOVERSION N/A 01/08/2020   Procedure: TRANSESOPHAGEAL ECHOCARDIOGRAM (TEE);  Surgeon: Matt Song, Donata Fryer, MD;  Location: [facility] OR;  Service: Open Heart Surgery;  Laterality: N/A;   TOTAL HIP ARTHROPLASTY Right 07/14/2022   Procedure: TOTAL HIP ARTHROPLASTY ANTERIOR APPROACH;  Surgeon: Molli Angelucci, MD;  Location: [facility] ORS;  Service:  Orthopedics;  Laterality: Right;     Family History: Family History  Problem Relation Age of Onset   Heart attack Father    Heart disease Brother    Stroke Brother    Cancer Neg Hx    Diabetes Neg Hx    Colon cancer Neg Hx    Esophageal cancer Neg Hx    Stomach cancer Neg Hx    Pancreatic cancer Neg Hx     Social History:  reports that he has never smoked. He has never used smokeless tobacco. He reports current alcohol use of about 1.0 standard drink of alcohol per week. He reports that he does not use drugs.  Physical Exam: BP (!) 148/66    Pulse (!) 59   Ht 5\' 7"  (1.702 m)   Wt 130 lb (59 kg)   BMI 20.36 kg/m    Constitutional:  Alert and oriented, No acute distress. Cardiovascular: No clubbing, cyanosis, or edema. Respiratory: Normal respiratory effort, no increased work of breathing. GI: Abdomen is soft, nontender, nondistended, no abdominal masses GU: Phallus with patent meatus, large right baseball sized scrotal mass, unclear if related to prior hydrocele repair or testicular tumor, left testicle 20 cc without masses   Assessment & Plan:   83 year old male with right scrotal swelling and pain, history of right hydrocelectomy in [place]  in March 2022 with reported prolonged recovery.  On exam he does have a very firm and abnormal appearing right hemiscrotum, unclear if this is related to scar tissue will, hematoma, or recurrence of hydrocele from prior surgery or unrelated new testicular mass.  I recommended a scrotal ultrasound for further evaluation prior to considering what treatment options we Botto have.  In terms of his ED, I recommended a trial of Cialis  10 to 20 mg on demand and risks and benefits were discussed.  Scrotal ultrasound, follow-up to discuss results Trial of Cialis  10 to 20 mg on demand for ED   Jay Meth, MD 04/13/2024  [facility] [address], [address] [place], [place] [address] [phone]

## 2024-04-18 ENCOUNTER — Other Ambulatory Visit: Payer: Self-pay

## 2024-04-18 DIAGNOSIS — N5089 Other specified disorders of the male genital organs: Secondary | ICD-10-CM

## 2024-04-25 ENCOUNTER — Ambulatory Visit
Admission: RE | Admit: 2024-04-25 | Discharge: 2024-04-25 | Disposition: A | Discharge status: Home / Self Care | Source: Ambulatory Visit | Attending: Urology | Admitting: Urology

## 2024-04-25 DIAGNOSIS — N5089 Other specified disorders of the male genital organs: Secondary | ICD-10-CM | POA: Insufficient documentation

## 2024-04-27 ENCOUNTER — Other Ambulatory Visit: Payer: Self-pay | Admitting: Urology

## 2024-04-27 DIAGNOSIS — N5089 Other specified disorders of the male genital organs: Secondary | ICD-10-CM

## 2024-04-27 DIAGNOSIS — R599 Enlarged lymph nodes, unspecified: Secondary | ICD-10-CM

## 2024-04-27 NOTE — Progress Notes (Signed)
 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

## 2024-04-28 ENCOUNTER — Ambulatory Visit

## 2024-04-28 ENCOUNTER — Ambulatory Visit
Admission: RE | Admit: 2024-04-28 | Discharge: 2024-04-28 | Disposition: A | Discharge status: Home / Self Care | Source: Ambulatory Visit | Attending: Urology | Admitting: Urology

## 2024-04-28 DIAGNOSIS — N5089 Other specified disorders of the male genital organs: Secondary | ICD-10-CM | POA: Insufficient documentation

## 2024-04-28 DIAGNOSIS — R599 Enlarged lymph nodes, unspecified: Secondary | ICD-10-CM | POA: Insufficient documentation

## 2024-04-28 MED ORDER — IOHEXOL 300 MG/ML  SOLN
100.0000 mL | Freq: Once | INTRAMUSCULAR | Status: AC | PRN
  Administered 2024-04-28: 100 mL via INTRAVENOUS

## 2024-05-03 ENCOUNTER — Other Ambulatory Visit: Payer: Self-pay

## 2024-05-03 DIAGNOSIS — R599 Enlarged lymph nodes, unspecified: Secondary | ICD-10-CM

## 2024-05-03 DIAGNOSIS — N5089 Other specified disorders of the male genital organs: Secondary | ICD-10-CM

## 2024-05-04 ENCOUNTER — Encounter: Payer: Self-pay | Admitting: Oncology

## 2024-05-04 ENCOUNTER — Inpatient Hospital Stay: Attending: Oncology | Admitting: Oncology

## 2024-05-04 ENCOUNTER — Inpatient Hospital Stay

## 2024-05-04 VITALS — BP 158/73 | HR 54 | Temp 97.6°F | Resp 18 | Wt 133.0 lb

## 2024-05-04 DIAGNOSIS — N5089 Other specified disorders of the male genital organs: Secondary | ICD-10-CM | POA: Diagnosis not present

## 2024-05-04 DIAGNOSIS — D4959 Neoplasm of unspecified behavior of other genitourinary organ: Secondary | ICD-10-CM | POA: Insufficient documentation

## 2024-05-04 DIAGNOSIS — K8689 Other specified diseases of pancreas: Secondary | ICD-10-CM | POA: Diagnosis not present

## 2024-05-04 DIAGNOSIS — K869 Disease of pancreas, unspecified: Secondary | ICD-10-CM

## 2024-05-04 DIAGNOSIS — C8580 Other specified types of non-Hodgkin lymphoma, unspecified site: Secondary | ICD-10-CM | POA: Insufficient documentation

## 2024-05-04 DIAGNOSIS — R748 Abnormal levels of other serum enzymes: Secondary | ICD-10-CM | POA: Insufficient documentation

## 2024-05-04 DIAGNOSIS — D378 Neoplasm of uncertain behavior of other specified digestive organs: Secondary | ICD-10-CM | POA: Diagnosis not present

## 2024-05-04 LAB — TECHNOLOGIST SMEAR REVIEW
Plt Morphology: NORMAL
RBC MORPHOLOGY: NORMAL
WBC MORPHOLOGY: NORMAL

## 2024-05-04 LAB — COMPREHENSIVE METABOLIC PANEL WITH GFR
ALT: 21 U/L (ref 0–44)
AST: 28 U/L (ref 15–41)
Albumin: 4.9 g/dL (ref 3.5–5.0)
Alkaline Phosphatase: 178 U/L — ABNORMAL HIGH (ref 38–126)
Anion gap: 12 (ref 5–15)
BUN: 13 mg/dL (ref 8–23)
CO2: 25 mmol/L (ref 22–32)
Calcium: 9.3 mg/dL (ref 8.9–10.3)
Chloride: 95 mmol/L — ABNORMAL LOW (ref 98–111)
Creatinine, Ser: 0.9 mg/dL (ref 0.61–1.24)
GFR, Estimated: >60 mL/min (ref >60)
Glucose, Bld: 107 mg/dL — ABNORMAL HIGH (ref 70–99)
Potassium: 4 mmol/L (ref 3.5–5.1)
Sodium: 132 mmol/L — ABNORMAL LOW (ref 135–145)
Total Bilirubin: 1.4 mg/dL — ABNORMAL HIGH (ref 0.0–1.2)
Total Protein: 8.5 g/dL — ABNORMAL HIGH (ref 6.5–8.1)

## 2024-05-04 LAB — CBC WITH DIFFERENTIAL/PLATELET
Abs Immature Granulocytes: 0.05 10*3/uL (ref 0.00–0.07)
Basophils Absolute: 0 10*3/uL (ref 0.0–0.1)
Basophils Relative: 1 %
Eosinophils Absolute: 0.1 10*3/uL (ref 0.0–0.5)
Eosinophils Relative: 2 %
HCT: 40.6 % (ref 39.0–52.0)
Hemoglobin: 13.8 g/dL (ref 13.0–17.0)
Immature Granulocytes: 1 %
Lymphocytes Relative: 27 %
Lymphs Abs: 2.3 10*3/uL (ref 0.7–4.0)
MCH: 29.9 pg (ref 26.0–34.0)
MCHC: 34 g/dL (ref 30.0–36.0)
MCV: 88.1 fL (ref 80.0–100.0)
Monocytes Absolute: 0.8 10*3/uL (ref 0.1–1.0)
Monocytes Relative: 10 %
Neutro Abs: 5.1 10*3/uL (ref 1.7–7.7)
Neutrophils Relative %: 59 %
Platelets: 278 10*3/uL (ref 150–400)
RBC: 4.61 MIL/uL (ref 4.22–5.81)
RDW: 12.3 % (ref 11.5–15.5)
WBC: 8.4 10*3/uL (ref 4.0–10.5)
nRBC: 0 % (ref 0.0–0.2)

## 2024-05-04 LAB — PSA: Prostatic Specific Antigen: 0.51 ng/mL (ref 0.00–4.00)

## 2024-05-04 LAB — LACTATE DEHYDROGENASE: LDH: 197 U/L — ABNORMAL HIGH (ref 98–192)

## 2024-05-04 NOTE — Progress Notes (Signed)
 Pt here to establish care. Pt reports he has been having unintentional weight loss.

## 2024-05-04 NOTE — Progress Notes (Signed)
 Hematology/Oncology Consult Note [phone] [phone]     REFERRING PROVIDER: Lawerence Pressman, MD    CHIEF COMPLAINTS/PURPOSE OF CONSULTATION:  Testicular masses  ASSESSMENT & PLAN:   Testicular mass CT imaging findings were reviewed and discussed with patient. Radiology impression favors lymphoblastic leukemia, lymphoma or metastatic disease over testicular cancer. Check AFP, beta hCG, LDH, CBC, CMP, peripheral blood flow cytometry   Addendum negative germ cell tumor markers. Negative peripheral blood flowcytometry.  Recommend excisional biopsy of testicular mass.   Pancreatic lesion Check CA 19-9  Elevated alkaline phosphatase level Pending above workup.   Orders Placed This Encounter  Procedures   CBC with Differential/Platelet    Standing Status:   Future    Number of Occurrences:   1    Expected Date:   05/04/2024    Expiration Date:   05/04/2025   Comprehensive metabolic panel with GFR    Standing Status:   Future    Number of Occurrences:   1    Expected Date:   05/04/2024    Expiration Date:   05/04/2025   AFP tumor marker    Standing Status:   Future    Number of Occurrences:   1    Expected Date:   05/04/2024    Expiration Date:   05/04/2025   Beta HCG, Quant (tumor marker)    Standing Status:   Future    Number of Occurrences:   1    Expected Date:   05/04/2024    Expiration Date:   05/04/2025   Flow cytometry panel-leukemia/lymphoma work-up    Standing Status:   Future    Number of Occurrences:   1    Expected Date:   05/04/2024    Expiration Date:   05/04/2025   Lactate dehydrogenase    Standing Status:   Future    Number of Occurrences:   1    Expected Date:   05/04/2024    Expiration Date:   05/04/2025   Cancer antigen 19-9    Standing Status:   Future    Number of Occurrences:   1    Expected Date:   05/04/2024    Expiration Date:   05/04/2025   Technologist smear review    Standing Status:   Future    Number of  Occurrences:   1    Expiration Date:   05/04/2025    Clinical information::   testicular mass suspect lymphomoa or leukemia involvement.   PSA    Standing Status:   Future    Number of Occurrences:   1    Expected Date:   05/04/2024    Expiration Date:   05/04/2025    All questions were answered. The patient knows to call the clinic with any problems, questions or concerns.  Johnathan Forbes, MD, PhD [facility] Hematology Oncology 05/04/2024    HISTORY OF PRESENTING ILLNESS:  [name] 83 y.o. male presents to establish care for testicular masses Discussed the use of AI scribe software for clinical note transcription with the patient, who gave verbal consent to proceed.  History of Present Illness  He has experienced scrotal swelling since a hydrocele repair in 2022. The swelling decreased slightly post-surgery but never fully resolved. Recently, he received a report indicating concerns in both testicles, prompting further evaluation.   He reports neck soreness and swelling, initially attributed to physical activity such as painting and building a fence. The neck soreness is described as 'almost miserable.'  He  mentions unintentional weight loss, noting a decrease from his usual weight of 135-140 pounds to 122 pounds over the past year. Despite efforts to regain weight, including going on cruises, he has not been successful. He currently weighs 133 pounds with clothes on.  No excessive night sweats. He feels cold most of the time but has experienced some fever. No family history of cancer but there is a family history of high cholesterol and heart problems. He has not had a PSA test but underwent a colonoscopy about three years ago.  04/25/2024 ultrasound scrotum with Doppler showed Diffusely heterogeneous right testicular echotexture, most consistent with dominant mass or masses. Multiple left testicular masses. Favor lymphoproliferative process such as lymphoma or leukemia. Bilateral  primary testicular neoplasms felt less likely.   Right epididymal enlargement and heterogeneity, favoring epididymitis. The appearance of the testicles is felt unlikely to be be infectious/inflammatory.  04/28/2024 CT chest with contrast abdomen pelvis with and without contrast showed 1. Heterogeneous enhancement in both testicles with substantial abnormal enlargement of the right testicle. This is suspicious for testicular neoplasm with lymphoblastic leukemia, lymphoma, or conceivably metastatic disease as top differential diagnostic considerations. Testicular adrenal rest tumors are a differential diagnostic consideration. 2. No overtly pathologic adenopathy identified. 3. Severe parenchymal atrophy of the pancreatic tail and body probably due to a 1.4 cm calcification/stone along the dorsal pancreatic duct at the junction of the body and head. Similar severe atrophy was shown on the CT chest from 01/07/2020. 4. Branching density in the superior segment right lower lobe favoring bronchiectasis with airway plugging. 5. Small type 1 hiatal hernia. 6. Prominent stool throughout the colon favors constipation. 7. Chronic appearing anterior wedge compressions at L1 and L2. 8. Lumbar spondylosis and degenerative disc disease at L5-S1 resulting in mild bilateral foraminal impingement. 9.  Aortic Atherosclerosis (ICD10-I70.0). 10. Nonspecific lesion peripherally in the right hepatic lobe. This could be further characterized with hepatic protocol MRI with and without contrast.     MEDICAL HISTORY:  Past Medical History:  Diagnosis Date   Arthritis    Bilateral pleural effusion    Bone spur 2010   right shoulder   CAD (coronary artery disease)    a. 12/2019 NSTEMI/Cath: LM 99ost/m, LAD 40, LCX nl, OM1 80, RCA 70ost/p, 99p, 30d, EF 45-50%; b. 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   Complication of anesthesia    a.) delayed emergence; b.) emergence delirium   Diverticulosis     HFrEF (heart failure with reduced ejection fraction) (HCC)    a.) TEE 01/08/2020: EF 45-50%; b.) TTE 04/14/2022: EF 50-55%, septal HK, G2DD, GLS /18.3%, mild-mod MR, AoV sclerosis without stenosis.   Hyperlipidemia    Hypertension    Hyponatremia    IBS (irritable bowel syndrome)    Ischemic cardiomyopathy    a. 12/2019 TEE: EF 45-50%. Nl RV fxn. Mild MR.   NSTEMI (non-ST elevated myocardial infarction) (HCC) 01/07/2020   a.) LHC 01/08/2020: EF 45-50%, LVEDP 22 mmHg; 99% o-mLM, 80% OM1, 40% mLAD, 70% o-pRCA, 99% pRCA, 30% dRCA --> consult CVTS; b.) 4v CABG 01/07/2022: LIMA-LAD, SVG-RPDA, SVG-OM1, SVG-OM2.   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   Skin cancer    a.) s/p Mohs surgery   Statin intolerance     SURGICAL HISTORY: Past Surgical History:  Procedure Laterality Date  BACK SURGERY     Lumbar   BUNIONECTOMY     CATARACT EXTRACTION, BILATERAL     COLONOSCOPY     CORONARY ARTERY BYPASS GRAFT N/A 01/08/2020   Procedure: CORONARY ARTERY BYPASS GRAFTING (CABG) x 4  WITH ENDOSCOPIC HARVESTING OF BILATERAL GREATER SAPHENOUS VEINS;  Surgeon: Heriberto London, MD;  Location: [facility] OR;  Service: Open Heart Surgery;  Laterality: N/A;   HEMORRHOID SURGERY  2004   INGUINAL HERNIA REPAIR  01/2010   LEFT HEART CATH AND CORONARY ANGIOGRAPHY N/A 01/08/2020   Procedure: LEFT HEART CATH AND CORONARY ANGIOGRAPHY;  Surgeon: Wenona Hamilton, MD;  Location: [facility] INVASIVE CV LAB;  Service: Cardiovascular;  Laterality: N/A;   ROTATOR CUFF REPAIR Left 06/2010   left/ bone spur Dr.Blackman   SHOULDER ARTHROSCOPY WITH OPEN ROTATOR CUFF REPAIR Right 05/26/2018   Procedure: SHOULDER ARTHROSCOPY WITH OPEN ROTATOR CUFF REPAIR;  Surgeon: Elner Hahn, MD;  Location: [facility] ORS;  Service: Orthopedics;  Laterality: Right;   debridement, decompression   SKIN CANCER EXCISION  2003-2005   Mohs-right shoulder   TEE WITHOUT CARDIOVERSION N/A 01/08/2020   Procedure: TRANSESOPHAGEAL ECHOCARDIOGRAM (TEE);  Surgeon: Matt Song, Donata Fryer, MD;  Location: [facility] OR;  Service: Open Heart Surgery;  Laterality: N/A;   TOTAL HIP ARTHROPLASTY Right 07/14/2022   Procedure: TOTAL HIP ARTHROPLASTY ANTERIOR APPROACH;  Surgeon: Molli Angelucci, MD;  Location: [facility] ORS;  Service: Orthopedics;  Laterality: Right;    SOCIAL HISTORY: Social History   Socioeconomic History   Marital status: Married    Spouse name: Not on file   Number of children: 2   Years of education: Not on file   Highest education level: Not on file  Occupational History   Occupation: retired Academic librarian: retired  Tobacco Use   Smoking status: Never   Smokeless tobacco: Never  Vaping Use   Vaping status: Never Used  Substance and Sexual Activity   Alcohol use: Not Currently    Alcohol/week: 1.0 standard drink of alcohol    Types: 1 Glasses of wine per week    Comment: very rare   Drug use: No   Sexual activity: Not on file  Other Topics Concern   Not on file  Social History Narrative   2 sons, no contact   Splits his time between [place]  and     Has live-in since 2005   Has living will   Requests Bernice--girlfriend or brother Porfirio Bristol, as health care POA   Would accept resuscitation but no prolonged artificial ventilation   Wouldn't want tube feeds if cognitively unaware   Social Drivers of Health   Financial Resource Strain: Low Risk  (04/05/2024)   [facility]   Overall Financial Resource Strain (CARDIA)    Difficulty of Paying Living Expenses: Not hard at all  Food Insecurity: No Food Insecurity (04/05/2024)   Received from [facility]   Hunger Vital Sign    Worried About Running Out of Food in the Last Year: Never true    Ran Out of Food in the Last Year:  Never true  Transportation Needs: No Transportation Needs (04/05/2024)   Received from [facility] - Transportation    In the past 12 months, has lack of transportation kept you from medical appointments or from getting medications?: No    Lack of Transportation (Non-Medical): No  Physical Activity: Not on file  Stress: Not on file  Social Connections: Not on file  Intimate Partner Violence: Not on file    FAMILY HISTORY: Family History  Problem Relation Age of Onset   Heart disease Mother    Alzheimer's disease Mother    Heart attack Father    Heart disease Brother    Stroke Brother    Cancer Neg Hx    Diabetes Neg Hx    Colon cancer Neg Hx    Esophageal cancer Neg Hx    Stomach cancer Neg Hx    Pancreatic cancer Neg Hx     ALLERGIES:  is allergic to crestor [rosuvastatin], penicillins, peanut-containing drug products, atorvastatin, and simvastatin.  MEDICATIONS:  Current Outpatient Medications  Medication Sig Dispense Refill   amLODipine  (NORVASC ) 5 MG tablet Take 1 tablet (5 mg total) by mouth daily. 180 tablet 3   docusate sodium  (COLACE) 100 MG capsule Take 1 capsule (100 mg total) by mouth 2 (two) times daily. 10 capsule 0   Evolocumab  (REPATHA  SURECLICK) 140 MG/ML SOAJ Inject 140 mg into the skin every 14 (fourteen) days. 2 mL 11   metoprolol  succinate (TOPROL -XL) 25 MG 24 hr tablet Take 0.5 tablets (12.5 mg total) by mouth daily. 45 tablet 3   omega-3 acid ethyl esters (LOVAZA ) 1 g capsule Take 1 g by mouth daily. Takes 3-4 days a week     nitroGLYCERIN  (NITROSTAT ) 0.4 MG SL tablet 0.4 mg. (Patient not taking: Reported on 05/04/2024)     tadalafil  (CIALIS ) 10 MG tablet Take 1-2 tablets (10-20 mg total) by mouth daily as needed for erectile dysfunction (take 1 hour prior to sexual activity). (Patient not taking: Reported on 05/04/2024) 30 tablet 11   traMADol  (ULTRAM ) 50 MG tablet Take 1 tablet (50 mg total) by mouth every 6 (six) hours.  (Patient not taking: Reported on 05/04/2024) 30 tablet 0   No current facility-administered medications for this visit.    Review of Systems - Oncology   PHYSICAL EXAMINATION: ECOG PERFORMANCE STATUS: 1 - Symptomatic but completely ambulatory  Vitals:   05/04/24 1520 05/04/24 1530  BP: (!) 163/69 (!) 158/73  Pulse: (!) 54 (!) 54  Resp: 18   Temp: 97.6 F (36.4 C)    Filed Weights   05/04/24 1520  Weight: 133 lb (60.3 kg)    Physical Exam Constitutional:      General: He is not in acute distress.    Appearance: He is not diaphoretic.  HENT:     Head: Normocephalic and atraumatic.  Eyes:     General: No scleral icterus. Cardiovascular:     Rate and Rhythm: Normal rate and regular rhythm.     Heart sounds: No murmur heard. Pulmonary:     Effort: Pulmonary effort is normal. No respiratory distress.     Breath sounds: No wheezing.  Abdominal:     General: There is no distension.     Palpations: Abdomen is soft.     Tenderness: There is no abdominal tenderness.  Musculoskeletal:        General: Normal range of motion.     Cervical back: Normal range of motion and neck supple.  Skin:    General: Skin is warm and dry.     Findings: No erythema.  Neurological:     Mental Status: He is alert and oriented to person, place, and time. Mental status is at baseline.     Motor: No abnormal muscle tone.  Psychiatric:        Mood and Affect: Mood and affect normal.  [facility] served as Biomedical engineer during physical examination. Large firm palpable right scrotal mass.  No palpable inguinal lymphadenopathy.  LABORATORY DATA:  I have reviewed the data as listed    Latest Ref Rng & Units 05/04/2024    4:13 PM 09/01/2023   10:07 AM 07/16/2022    3:58 AM  CBC  WBC 4.0 - 10.5 K/uL 8.4  5.7  12.8   Hemoglobin 13.0 - 17.0 g/dL 04.5  40.9  9.5   Hematocrit 39.0 - 52.0 % 40.6  42.1  27.7   Platelets 150 - 400 K/uL 278  210  200       Latest Ref Rng & Units 05/04/2024    4:13 PM  09/01/2023   10:07 AM 07/15/2022    6:00 AM  CMP  Glucose 70 - 99 mg/dL 811  95  914   BUN 8 - 23 mg/dL 13  7  10    Creatinine 0.61 - 1.24 mg/dL 7.82  9.56  2.13   Sodium 135 - 145 mmol/L 132  133  130   Potassium 3.5 - 5.1 mmol/L 4.0  4.1  4.5   Chloride 98 - 111 mmol/L 95  96  99   CO2 22 - 32 mmol/L 25  23  25    Calcium  8.9 - 10.3 mg/dL 9.3  9.3  8.1   Total Protein 6.5 - 8.1 g/dL 8.5     Total Bilirubin 0.0 - 1.2 mg/dL 1.4     Alkaline Phos 38 - 126 U/L 178     AST 15 - 41 U/L 28     ALT 0 - 44 U/L 21        RADIOGRAPHIC STUDIES: I have personally reviewed the radiological images as listed and agreed with the findings in the report. CT ABDOMEN PELVIS W WO CONTRAST Result Date: 04/28/2024 CLINICAL DATA:  Hydrocele removal in 2024. Swelling and discomfort of the right scrotum oval past 3-4 months. Bilateral scrotal masses on ultrasound. EXAM: CT CHEST WITH CONTRAST CT ABDOMEN AND PELVIS WITH AND WITHOUT CONTRAST TECHNIQUE: Multidetector CT imaging of the chest was performed during intravenous contrast administration. Multidetector CT imaging of the abdomen and pelvis was performed following the standard protocol before and during bolus administration of intravenous contrast. RADIATION DOSE REDUCTION: This exam was performed according to the departmental dose-optimization program which includes automated exposure control, adjustment of the mA and/or kV according to patient size and/or use of iterative reconstruction technique. CONTRAST:  OMNIPAQUE  IOHEXOL  300 MG/ML  SOLN COMPARISON:  Scrotal ultrasound 04/25/2024 FINDINGS: CT CHEST FINDINGS Cardiovascular: INSERT chest prior CABG. Mediastinum/Nodes: Small type 1 hiatal hernia. 0.9 cm right paratracheal node on image 58 series 11. 1.0 cm left paratracheal lymph node on image 25 series 9. Small bilateral infrahilar lymph nodes. Lungs/Pleura: Branching density in the superior segment right lower lobe favoring bronchiectasis with airway  plugging on images 69-72 of series 10. Musculoskeletal: Thoracic spondylosis with multilevel anterior bridging spurring. Grade 1 anterolisthesis at C6-7. CT ABDOMEN AND PELVIS FINDINGS Hepatobiliary: There are a few scattered sub-centimeter nodules in the liver which are too small to characterize although statistically likely to be benign lesions such as cysts. Peripherally in the right hepatic lobe a 1.6 by 1.0 cm bilobed lesion has faint calcification in a linear manner traversing the central portion, possibly along a septation. This Rabon represent a cystic lesion with internal nodularity or 2 adjacent cysts, but is technically nonspecific. A peripheral subcapsular calcification posteriorly in the right hepatic lobe on image  9 series 16 is likely postinflammatory. Gallbladder unremarkable. Pancreas: Severe parenchymal atrophy of the pancreatic tail and body along with dorsal pancreatic duct dilatation up to 1.1 cm extending to a 1.4 by 0.9 cm dense calcification along the dorsal pancreatic duct. 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 It is possible that this represents a postinflammatory pancreatic calcification which obstructed the dorsal pancreatic duct leading to severe atrophy of the pancreatic body and tail. Spleen: Unremarkable Adrenals/Urinary Tract: 2 mm left mid kidney nonobstructive renal calculus. Otherwise unremarkable. Stomach/Bowel: Small posterior gastric diverticulum. Prominent stool throughout the colon favors constipation. Vascular/Lymphatic: Atherosclerosis is present, including aortoiliac atherosclerotic disease. Atheromatous plaque in the celiac trunk and SMA without occlusion. No overtly pathologic adenopathy identified. A small left inguinal node measures 0.7 cm in short axis on image 111 series 9 and an elongated but narrow right inguinal lymph  node measures 0.8 cm in short axis on image 109 series 9. Reproductive: Heterogeneous enhancement in both testicles with substantial abnormal enlargement of the right testicle. Mild prostatomegaly indenting the bladder base. Other: No supplemental non-categorized findings. Musculoskeletal: Chronic appearing anterior wedge compressions at L1 and L2. Slightly transitional S1 vertebra. Right total hip prosthesis. Lumbar spondylosis and degenerative disc disease at L5-S1 resulting in mild bilateral foraminal impingement. IMPRESSION: 1. Heterogeneous enhancement in both testicles with substantial abnormal enlargement of the right testicle. This is suspicious for testicular neoplasm with lymphoblastic leukemia, lymphoma, or conceivably metastatic disease as top differential diagnostic considerations. Testicular adrenal rest tumors are a differential diagnostic consideration. 2. No overtly pathologic adenopathy identified. 3. Severe parenchymal atrophy of the pancreatic tail and body probably due to a 1.4 cm calcification/stone along the dorsal pancreatic duct at the junction of the body and head. Similar severe atrophy was shown on the CT chest from 01/07/2020. 4. Branching density in the superior segment right lower lobe favoring bronchiectasis with airway plugging. 5. Small type 1 hiatal hernia. 6. Prominent stool throughout the colon favors constipation. 7. Chronic appearing anterior wedge compressions at L1 and L2. 8. Lumbar spondylosis and degenerative disc disease at L5-S1 resulting in mild bilateral foraminal impingement. 9.  Aortic Atherosclerosis (ICD10-I70.0). 10. Nonspecific lesion peripherally in the right hepatic lobe. This could be further characterized with hepatic protocol MRI with and without contrast. Electronically Signed   By: Freida Jes M.D.   On: 04/28/2024 10:09   CT CHEST W CONTRAST Result Date: 04/28/2024 CLINICAL DATA:  Hydrocele removal in 2024. Swelling and discomfort of the right  scrotum oval past 3-4 months. Bilateral scrotal masses on ultrasound. EXAM: CT CHEST WITH CONTRAST CT ABDOMEN AND PELVIS WITH AND WITHOUT CONTRAST TECHNIQUE: Multidetector CT imaging of the chest was performed during intravenous contrast administration. Multidetector CT imaging of the abdomen and pelvis was performed following the standard protocol before and during bolus administration of intravenous contrast. RADIATION DOSE REDUCTION: This exam was performed according to the departmental dose-optimization program which includes automated exposure control, adjustment of the mA and/or kV according to patient size and/or use of iterative reconstruction technique. CONTRAST:  OMNIPAQUE  IOHEXOL  300 MG/ML  SOLN COMPARISON:  Scrotal ultrasound 04/25/2024 FINDINGS: CT CHEST FINDINGS Cardiovascular: INSERT chest prior CABG. Mediastinum/Nodes: Small type 1 hiatal hernia. 0.9 cm right paratracheal node on image 58 series 11. 1.0 cm left paratracheal lymph node on image 25 series 9. Small bilateral infrahilar lymph nodes. Lungs/Pleura: Branching density in the superior segment right lower  lobe favoring bronchiectasis with airway plugging on images 69-72 of series 10. Musculoskeletal: Thoracic spondylosis with multilevel anterior bridging spurring. Grade 1 anterolisthesis at C6-7. CT ABDOMEN AND PELVIS FINDINGS Hepatobiliary: There are a few scattered sub-centimeter nodules in the liver which are too small to characterize although statistically likely to be benign lesions such as cysts. Peripherally in the right hepatic lobe a 1.6 by 1.0 cm bilobed lesion has faint calcification in a linear manner traversing the central portion, possibly along a septation. This Monceaux represent a cystic lesion with internal nodularity or 2 adjacent cysts, but is technically nonspecific. A peripheral subcapsular calcification posteriorly in the right hepatic lobe on image 9 series 16 is likely postinflammatory. Gallbladder unremarkable.  Pancreas: Severe parenchymal atrophy of the pancreatic tail and body along with dorsal pancreatic duct dilatation up to 1.1 cm extending to a 1.4 by 0.9 cm dense calcification along the dorsal pancreatic duct. 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 It is possible that this represents a postinflammatory pancreatic calcification which obstructed the dorsal pancreatic duct leading to severe atrophy of the pancreatic body and tail. Spleen: Unremarkable Adrenals/Urinary Tract: 2 mm left mid kidney nonobstructive renal calculus. Otherwise unremarkable. Stomach/Bowel: Small posterior gastric diverticulum. Prominent stool throughout the colon favors constipation. Vascular/Lymphatic: Atherosclerosis is present, including aortoiliac atherosclerotic disease. Atheromatous plaque in the celiac trunk and SMA without occlusion. No overtly pathologic adenopathy identified. A small left inguinal node measures 0.7 cm in short axis on image 111 series 9 and an elongated but narrow right inguinal lymph node measures 0.8 cm in short axis on image 109 series 9. Reproductive: Heterogeneous enhancement in both testicles with substantial abnormal enlargement of the right testicle. Mild prostatomegaly indenting the bladder base. Other: No supplemental non-categorized findings. Musculoskeletal: Chronic appearing anterior wedge compressions at L1 and L2. Slightly transitional S1 vertebra. Right total hip prosthesis. Lumbar spondylosis and degenerative disc disease at L5-S1 resulting in mild bilateral foraminal impingement. IMPRESSION: 1. Heterogeneous enhancement in both testicles with substantial abnormal enlargement of the right testicle. This is suspicious for testicular neoplasm with lymphoblastic leukemia, lymphoma, or conceivably metastatic disease as top differential diagnostic  considerations. Testicular adrenal rest tumors are a differential diagnostic consideration. 2. No overtly pathologic adenopathy identified. 3. Severe parenchymal atrophy of the pancreatic tail and body probably due to a 1.4 cm calcification/stone along the dorsal pancreatic duct at the junction of the body and head. Similar severe atrophy was shown on the CT chest from 01/07/2020. 4. Branching density in the superior segment right lower lobe favoring bronchiectasis with airway plugging. 5. Small type 1 hiatal hernia. 6. Prominent stool throughout the colon favors constipation. 7. Chronic appearing anterior wedge compressions at L1 and L2. 8. Lumbar spondylosis and degenerative disc disease at L5-S1 resulting in mild bilateral foraminal impingement. 9.  Aortic Atherosclerosis (ICD10-I70.0). 10. Nonspecific lesion peripherally in the right hepatic lobe. This could be further characterized with hepatic protocol MRI with and without contrast. Electronically Signed   By: Freida Jes M.D.   On: 04/28/2024 10:09   US  SCROTUM W/DOPPLER Result Date: 04/27/2024 CLINICAL DATA:  Right scrotal swelling/pain EXAM: SCROTAL ULTRASOUND DOPPLER ULTRASOUND OF THE TESTICLES TECHNIQUE: Complete ultrasound examination of the testicles, epididymis, and other scrotal structures was performed. Color and spectral Doppler ultrasound were also utilized to evaluate blood flow to the testicles. COMPARISON:  None Available. FINDINGS: Right testicle Measurements: 6.7 x 5.1 x 5.2  cm. Diffusely heterogeneous echotexture, most consistent with dominant testicular mass or masses. Left testicle Measurements: 5.3 x 2.6 x 3.8 cm. Multiple hypoechoic lesions within. Example 1.2 x 0.7 cm anteriorly. Right epididymis: Enlarged and heterogeneous including at 1.8 x 2.3 cm. Left epididymis:  Epididymal head cyst versus spermatocele at 6 mm. Hydrocele:  None visualized. Varicocele:  None visualized. Pulsed Doppler interrogation of both testes  demonstrates normal low resistance arterial and venous waveforms bilaterally. IMPRESSION: Diffusely heterogeneous right testicular echotexture, most consistent with dominant mass or masses. Multiple left testicular masses. Favor lymphoproliferative process such as lymphoma or leukemia. Bilateral primary testicular neoplasms felt less likely. Right epididymal enlargement and heterogeneity, favoring epididymitis. The appearance of the testicles is felt unlikely to be be infectious/inflammatory. These results will be called to the ordering clinician or representative by the Radiologist Assistant, and communication documented in the PACS or [employer]. Electronically Signed   By: Lore Rode M.D.   On: 04/27/2024 10:08

## 2024-05-04 NOTE — Assessment & Plan Note (Signed)
 Check CA 19-9.

## 2024-05-04 NOTE — Assessment & Plan Note (Signed)
 Pending above work up

## 2024-05-04 NOTE — Assessment & Plan Note (Addendum)
 CT imaging findings were reviewed and discussed with patient. Radiology impression favors lymphoblastic leukemia, lymphoma or metastatic disease over testicular cancer. Check AFP, beta hCG, LDH, CBC, CMP, peripheral blood flow cytometry   Addendum negative germ cell tumor markers. Negative peripheral blood flowcytometry.  Recommend excisional biopsy of testicular mass.

## 2024-05-05 LAB — BETA HCG QUANT (REF LAB): hCG Quant: 2 m[IU]/mL (ref 0–3)

## 2024-05-05 LAB — AFP TUMOR MARKER: AFP, Serum, Tumor Marker: <1.8 ng/mL (ref 0.0–6.4)

## 2024-05-05 LAB — CANCER ANTIGEN 19-9: CA 19-9: 15 U/mL (ref 0–35)

## 2024-05-09 ENCOUNTER — Ambulatory Visit: Payer: Self-pay | Admitting: Oncology

## 2024-05-09 ENCOUNTER — Telehealth: Payer: Self-pay | Admitting: *Deleted

## 2024-05-09 DIAGNOSIS — R221 Localized swelling, mass and lump, neck: Secondary | ICD-10-CM

## 2024-05-09 LAB — COMP PANEL: LEUKEMIA/LYMPHOMA

## 2024-05-09 NOTE — Telephone Encounter (Signed)
 Rosanne Commodore who is his Significant other, she wanted to know when were going to get an appointment to tell what is going on for him.  I spoke to [place] and she says they are still waiting on the  Flow cytometry panel-leukemia/lymphoma .  The results are done and the team will call and make an appointment so they can go over all of the information.  I had to leave this as a voicemail because no one answered the phone.

## 2024-05-09 NOTE — Telephone Encounter (Signed)
 Johnathan  Cook Significant other called because a left a message but she did not get it.  I told her that there is 1 more lab it is a flow cytometry and when that gets done then Dr. Wilhelmenia Harada with the team Nellie [facility]as will call and set up an appointment as soon as the cytometry gets done.  She is okay with that

## 2024-05-10 ENCOUNTER — Other Ambulatory Visit: Payer: Self-pay | Admitting: Urology

## 2024-05-10 ENCOUNTER — Other Ambulatory Visit: Payer: Self-pay

## 2024-05-10 DIAGNOSIS — N5089 Other specified disorders of the male genital organs: Secondary | ICD-10-CM

## 2024-05-10 NOTE — Telephone Encounter (Signed)
-----   Message from [name] sent at 05/09/2024 11:19 PM EDT ----- Team, please let patient know that his blood work did not showed obvious evidence of lymphoma, leukemia. I recommend him to get right testicle excisional biopsy by urology to establish tissue diagnosis.   Cc Dr. Estanislao Heimlich, his flowcytometry is negative. LDH is slightly elevated. Would you please arrange him to get excisional biopsy/orchiectomy? Thanks.

## 2024-05-10 NOTE — Telephone Encounter (Addendum)
 Spoke to pt and his wife. Informed them of MD recommendation. They said that pt is scheduled to see Dr. Estanislao Heimlich tomorrow and will be scheduled for orchiectomy on Friday. Pt also voiced concerns that his lymph nodes in the neck are swollen and painful. MD notified and would like for US  of neck to be obtained for further eval.   Please schedule pt for US  neck and notify pt/ wife of appt details. They are aware of plan and would like a call.

## 2024-05-11 ENCOUNTER — Encounter
Admission: RE | Admit: 2024-05-11 | Discharge: 2024-05-11 | Disposition: A | Discharge status: Home / Self Care | Source: Ambulatory Visit | Attending: Urology | Admitting: Urology

## 2024-05-11 ENCOUNTER — Ambulatory Visit: Admitting: Urology

## 2024-05-11 ENCOUNTER — Other Ambulatory Visit: Payer: Self-pay

## 2024-05-11 VITALS — BP 142/58 | HR 54 | Ht 67.0 in | Wt 128.0 lb

## 2024-05-11 DIAGNOSIS — I25118 Atherosclerotic heart disease of native coronary artery with other forms of angina pectoris: Secondary | ICD-10-CM

## 2024-05-11 DIAGNOSIS — I4892 Unspecified atrial flutter: Secondary | ICD-10-CM

## 2024-05-11 DIAGNOSIS — I214 Non-ST elevation (NSTEMI) myocardial infarction: Secondary | ICD-10-CM

## 2024-05-11 DIAGNOSIS — Z01810 Encounter for preprocedural cardiovascular examination: Secondary | ICD-10-CM

## 2024-05-11 DIAGNOSIS — I255 Ischemic cardiomyopathy: Secondary | ICD-10-CM

## 2024-05-11 DIAGNOSIS — N5089 Other specified disorders of the male genital organs: Secondary | ICD-10-CM | POA: Diagnosis not present

## 2024-05-11 DIAGNOSIS — I10 Essential (primary) hypertension: Secondary | ICD-10-CM

## 2024-05-11 HISTORY — DX: Personal history of other diseases of the circulatory system: Z86.79

## 2024-05-11 HISTORY — DX: Gastro-esophageal reflux disease without esophagitis: K21.9

## 2024-05-11 HISTORY — DX: Disease of pancreas, unspecified: K86.9

## 2024-05-11 HISTORY — DX: Other specified disorders of the male genital organs: N50.89

## 2024-05-11 MED ORDER — CEFAZOLIN SODIUM-DEXTROSE 2-4 GM/100ML-% IV SOLN
2.0000 g | INTRAVENOUS | Status: AC
  Administered 2024-05-12: 2 g via INTRAVENOUS

## 2024-05-11 MED ORDER — LACTATED RINGERS IV SOLN: INTRAVENOUS | Status: DC

## 2024-05-11 MED ORDER — CHLORHEXIDINE GLUCONATE 0.12 % MT SOLN
15.0000 mL | Freq: Once | OROMUCOSAL | Status: AC
  Administered 2024-05-12: 15 mL via OROMUCOSAL

## 2024-05-11 MED ORDER — ORAL CARE MOUTH RINSE: 15.0000 mL | Freq: Once | OROMUCOSAL | Status: AC

## 2024-05-11 NOTE — Progress Notes (Signed)
   05/11/2024 12:30 PM   [name] 05-04-1941 [id number]  Reason for visit: Right testicular mass, ED  HPI: 83 year old male with history of right-sided hydrocele repair in [place]  3 to 4 years ago who presented with right-sided scrotal discomfort and right-sided mass.  Evaluation with scrotal ultrasound showed a 7 cm right testicle that was diffusely heterogenous consistent with a dominant testicular mass or masses worrisome for malignancy, left testicle also had multiple smaller hypoechoic lesions that were suspicious within.  He underwent extensive workup with oncology for possible lymphoma or other malignancy which is all been negative.  CT chest abdomen and pelvis with no other abnormalities, lab work benign.  After long discussion with patient and oncology, have opted for right radical orchiectomy for symptom improvement and tissue diagnosis.  He understands potential need for left orchiectomy in the future or additional systemic treatments pending pathology results.  We discussed risks of right radical orchiectomy including bleeding, infection, recurrence, numbness/skin changes, incisional pain.  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  We will address after orchiectomy and recovery, can check a morning testosterone at follow-up.  Right radical orchiectomy scheduled for tomorrow  Lawerence Pressman, MD  [facility] [address] [place], [place] [address] [phone]

## 2024-05-11 NOTE — Patient Instructions (Addendum)
 Your procedure is scheduled on:05-12-24 Friday Report to the Registration Desk on the 1st floor of the [facility].Then proceed to the 2nd floor Surgery Desk To find out your arrival time, please call [phone] between 1PM - 3PM on:05-11-24 Thursday If your arrival time is 6:00 am, do not arrive before that time as the Medical Mall entrance doors do not open until 6:00 am.  REMEMBER: Instructions that are not followed completely Kubota result in serious medical risk, up to and including death; or upon the discretion of your surgeon and anesthesiologist your surgery Koska need to be rescheduled.  Do not eat food OR drink any liquids after midnight the night before surgery.  No gum chewing or hard candies.  One week prior to surgery:Stop NOW (05-11-24) Stop Anti-inflammatories (NSAIDS) such as Advil, Aleve, Ibuprofen, Motrin, Naproxen, Naprosyn and Aspirin  based products such as Excedrin, Goody's Powder, BC Powder. Stop ANY OVER THE COUNTER supplements until after surgery.  You Briere however, continue to take Tylenol  if needed for pain up until the day of surgery.  Stop tadalafil  (CIALIS ) NOW (05-11-24)-Do NOT take again until AFTER surgery  Continue taking all of your other prescription medications up until the day of surgery.  ON THE DAY OF SURGERY ONLY TAKE THESE MEDICATIONS WITH SIPS OF WATER: -amLODipine  (NORVASC )  -metoprolol  succinate (TOPROL -XL)   No Alcohol for 24 hours before or after surgery.  No Smoking including e-cigarettes for 24 hours before surgery.  No chewable tobacco products for at least 6 hours before surgery.  No nicotine patches on the day of surgery.  Do not use any "recreational" drugs for at least a week (preferably 2 weeks) before your surgery.  Please be advised that the combination of cocaine and anesthesia Utt have negative outcomes, up to and including death. If you test positive for cocaine, your surgery will be cancelled.  On the morning of surgery brush  your teeth with toothpaste and water, you Gitto rinse your mouth with mouthwash if you wish. Do not swallow any toothpaste or mouthwash.  Do not wear jewelry, make-up, hairpins, clips or nail polish.  For welded (permanent) jewelry: bracelets, anklets, waist bands, etc.  Please have this removed prior to surgery.  If it is not removed, there is a chance that hospital personnel will need to cut it off on the day of surgery.  Do not wear lotions, powders, or perfumes.   Do not shave body hair from the neck down 48 hours before surgery.  Contact lenses, hearing aids and dentures Cada not be worn into surgery.  Do not bring valuables to the hospital. [facility] is not responsible for any missing/lost belongings or valuables.   Notify your doctor if there is any change in your medical condition (cold, fever, infection).  Wear comfortable clothing (specific to your surgery type) to the hospital.  After surgery, you can help prevent lung complications by doing breathing exercises.  Take deep breaths and cough every 1-2 hours. Your doctor Zettler order a device called an Incentive Spirometer to help you take deep breaths. When coughing or sneezing, hold a pillow firmly against your incision with both hands. This is called "splinting." Doing this helps protect your incision. It also decreases belly discomfort.  If you are being admitted to the hospital overnight, leave your suitcase in the car. After surgery it Ikner be brought to your room.  In case of increased patient census, it Bench be necessary for you, the patient, to continue your postoperative care in  the Same Day Surgery department.  If you are being discharged the day of surgery, you will not be allowed to drive home. You will need a responsible individual to drive you home and stay with you for 24 hours after surgery.   If you are taking public transportation, you will need to have a responsible individual with you.  Please call the  Pre-admissions Testing Dept. at [phone] if you have any questions about these instructions.  Surgery Visitation Policy:  Patients having surgery or a procedure Alig have two visitors.  Children under the age of 25 must have an adult with them who is not the patient.

## 2024-05-11 NOTE — Patient Instructions (Signed)
 Orchiectomy  An orchiectomy is the removal of one or both testicles. It is most often done to treat cancer of the testicles. Less commonly, it Cooner be done in men with prostate cancer, or to prevent cancer in men whose testicles did not develop normally. An orchiectomy Merendino also be needed when an injury to a testicle cannot be repaired. The testicles can be replaced with artificial testicles (prostheses). Tell a health care provider about: Any allergies you have. All medicines you are taking, including vitamins, herbs, eye drops, creams, and over-the-counter medicines. Any problems you or family members have had with anesthetic medicines. Any bleeding disorders you have. Any surgeries you have had. Any medical conditions you have. What are the risks? Generally, this is a safe procedure. However, problems Meulemans occur, including: Infection. Bleeding inside the sac that holds the testicles (scrotum). This is called a scrotal hematoma. Pain. Damage to nearby organs or structures, such as the nerves. Discharge from the surgical site. Allergic reactions to medicines. Inability to produce sperm (infertility), if both testicles are removed. Hot flashes, if both testicles are removed. What happens before the procedure? When to stop eating and drinking  Follow instructions from your health care provider about what you Doublin eat and drink before your procedure. These Gallina include: 8 hours before your procedure Stop eating most foods. Do not eat meat, fried foods, or fatty foods. Eat only light foods, such as toast or crackers. All liquids are okay except energy drinks and alcohol. 6 hours before your procedure Stop eating. Drink only clear liquids, such as water, clear fruit juice, black coffee, plain tea, and sports drinks. Do not drink energy drinks or alcohol. 2 hours before the procedure Stop drinking all liquids. You Matus be allowed to take medicines with small sips of water. Medicines Ask your  health care provider about: Changing or stopping your regular medicines. This is especially important if you are taking diabetes medicines or blood thinners. Taking medicines such as aspirin  and ibuprofen. These medicines can thin your blood. Do not take these medicines unless your health care provider tells you to take them. Taking over-the-counter medicines, vitamins, herbs, and supplements. General instructions You Kilgallon need to collect your sperm in a sperm bank because this procedure can affect your ability to produce sperm (fertility). Ask your health care provider if this is necessary. Do not use any products that contain nicotine or tobacco for at least 4 weeks before the procedure. These products include cigarettes, chewing tobacco, and vaping devices, such as e-cigarettes. If you need help quitting, ask your health care provider. Plan to have someone take you home from the hospital or clinic. Surgery safety Ask your health care provider: How your surgery site will be marked. What steps will be taken to help prevent infection. These steps Piechota include: Removing hair at the surgery site. Washing skin with a germ-killing soap. Taking antibiotic medicine. What happens during the procedure? An IV will be inserted into one of your veins. You will be given one or more of the following: A medicine to help you relax (sedative). A medicine to numb the area (local anesthetic). A medicine to make you fall asleep (general anesthetic). This procedure Denson involve removal of one or both testicles. The steps for the procedure will depend on the reason for the procedure. If your procedure is for treatment of testicular cancer: An incision will be made in the groin. The testicle and the spermatic cord will be removed through the groin incision. A  prosthetic filled with saline Jeffus be inserted to fill the space in the scrotum where the testicle was removed. If your procedure is for treatment of prostate  cancer: An incision will be made in the scrotum. The testicle will be removed through the incision in the scrotum. A prosthetic filled with saline Dollar be inserted to fill the space in the scrotum where the testicle was removed. After the removal, the incision will be closed with stitches (sutures), skin glue, or adhesive strips. A sterile bandage (dressing) will be applied to the incision site. The procedure Lott vary among health care providers and hospitals. What happens after the procedure? Your blood pressure, heart rate, breathing rate, and blood oxygen level will be monitored until you leave the hospital or clinic. You will be allowed to go home once you are awake, stable, taking fluids well, and have no other problems. You Brents have scrotal support. If the scrotal support irritates your incision site, you Mulvey remove the support. You will have a sterile dressing. You will be instructed when to remove the dressing and when you can shower. If you were given a sedative during the procedure, it can affect you for several hours. Do not drive or operate machinery until your health care provider says that it is safe. Summary Orchiectomy is a surgical procedure to remove one or both testicles. It is most often done to treat cancer of the testicles. Tell your health care provider about any other medical conditions that you have and the medicines that you take to treat those conditions. Follow instructions from your health care provider about what to eat and drink before the procedure. One or both testicles will be removed. A prosthesis filled with saline Rosekrans be inserted to fill the space in the scrotum where the testicle was removed. You will be monitored closely after the procedure. Plan to have someone take you home from the hospital or clinic. This information is not intended to replace advice given to you by your health care provider. Make sure you discuss any questions you have with your health  care provider. Document Revised: 06/06/2021 Document Reviewed: 06/06/2021 Elsevier Patient Education  2024 [employer].

## 2024-05-12 ENCOUNTER — Ambulatory Visit
Admission: RE | Admit: 2024-05-12 | Discharge: 2024-05-12 | Disposition: A | Discharge status: Home / Self Care | Attending: Urology | Admitting: Urology

## 2024-05-12 ENCOUNTER — Ambulatory Visit: Admitting: Anesthesiology

## 2024-05-12 ENCOUNTER — Encounter
Admission: RE | Disposition: A | Discharge status: Home / Self Care | Payer: Self-pay | Source: Home / Self Care | Attending: Urology

## 2024-05-12 ENCOUNTER — Encounter: Payer: Self-pay | Admitting: Urology

## 2024-05-12 ENCOUNTER — Other Ambulatory Visit: Payer: Self-pay

## 2024-05-12 DIAGNOSIS — I110 Hypertensive heart disease with heart failure: Secondary | ICD-10-CM | POA: Diagnosis not present

## 2024-05-12 DIAGNOSIS — I214 Non-ST elevation (NSTEMI) myocardial infarction: Secondary | ICD-10-CM

## 2024-05-12 DIAGNOSIS — I2510 Atherosclerotic heart disease of native coronary artery without angina pectoris: Secondary | ICD-10-CM | POA: Insufficient documentation

## 2024-05-12 DIAGNOSIS — I10 Essential (primary) hypertension: Secondary | ICD-10-CM

## 2024-05-12 DIAGNOSIS — N5089 Other specified disorders of the male genital organs: Secondary | ICD-10-CM

## 2024-05-12 DIAGNOSIS — C83398 Diffuse large b-cell lymphoma of other extranodal and solid organ sites: Secondary | ICD-10-CM | POA: Insufficient documentation

## 2024-05-12 DIAGNOSIS — Z01812 Encounter for preprocedural laboratory examination: Secondary | ICD-10-CM | POA: Diagnosis not present

## 2024-05-12 DIAGNOSIS — I5022 Chronic systolic (congestive) heart failure: Secondary | ICD-10-CM | POA: Diagnosis not present

## 2024-05-12 DIAGNOSIS — I4892 Unspecified atrial flutter: Secondary | ICD-10-CM

## 2024-05-12 DIAGNOSIS — I252 Old myocardial infarction: Secondary | ICD-10-CM | POA: Insufficient documentation

## 2024-05-12 DIAGNOSIS — Z79899 Other long term (current) drug therapy: Secondary | ICD-10-CM | POA: Insufficient documentation

## 2024-05-12 DIAGNOSIS — C8519 Unspecified B-cell lymphoma, extranodal and solid organ sites: Secondary | ICD-10-CM | POA: Diagnosis not present

## 2024-05-12 DIAGNOSIS — I255 Ischemic cardiomyopathy: Secondary | ICD-10-CM

## 2024-05-12 DIAGNOSIS — Z01810 Encounter for preprocedural cardiovascular examination: Secondary | ICD-10-CM

## 2024-05-12 DIAGNOSIS — I25118 Atherosclerotic heart disease of native coronary artery with other forms of angina pectoris: Secondary | ICD-10-CM

## 2024-05-12 DIAGNOSIS — Z951 Presence of aortocoronary bypass graft: Secondary | ICD-10-CM | POA: Diagnosis not present

## 2024-05-12 HISTORY — PX: ORCHIECTOMY: SHX2116

## 2024-05-12 SURGERY — ORCHIECTOMY
Anesthesia: General | Site: Scrotum | Laterality: Right

## 2024-05-12 MED ORDER — PHENYLEPHRINE 80 MCG/ML (10ML) SYRINGE FOR IV PUSH (FOR BLOOD PRESSURE SUPPORT)
PREFILLED_SYRINGE | INTRAVENOUS | Status: AC
  Filled 2024-05-12: qty 10

## 2024-05-12 MED ORDER — PHENYLEPHRINE HCL-NACL 20-0.9 MG/250ML-% IV SOLN
INTRAVENOUS | Status: DC | PRN
  Administered 2024-05-12: 20 ug/min via INTRAVENOUS

## 2024-05-12 MED ORDER — OXYCODONE HCL 5 MG/5ML PO SOLN: 5.0000 mg | Freq: Once | ORAL | Status: DC | PRN

## 2024-05-12 MED ORDER — FENTANYL CITRATE (PF) 100 MCG/2ML IJ SOLN
INTRAMUSCULAR | Status: DC | PRN
  Administered 2024-05-12 (×2): 50 ug via INTRAVENOUS

## 2024-05-12 MED ORDER — OXYCODONE HCL 5 MG PO TABS: 5.0000 mg | ORAL_TABLET | Freq: Once | ORAL | Status: DC | PRN

## 2024-05-12 MED ORDER — EPHEDRINE SULFATE-NACL 50-0.9 MG/10ML-% IV SOSY
PREFILLED_SYRINGE | INTRAVENOUS | Status: DC | PRN
Start: 2024-05-12 — End: 2024-05-12
  Administered 2024-05-12: 5 mg via INTRAVENOUS
  Administered 2024-05-12: 10 mg via INTRAVENOUS
  Administered 2024-05-12 (×2): 5 mg via INTRAVENOUS
  Administered 2024-05-12: 10 mg via INTRAVENOUS
  Administered 2024-05-12: 5 mg via INTRAVENOUS

## 2024-05-12 MED ORDER — ACETAMINOPHEN 500 MG PO TABS
ORAL_TABLET | ORAL | Status: AC
  Filled 2024-05-12: qty 2

## 2024-05-12 MED ORDER — GLYCOPYRROLATE 0.2 MG/ML IJ SOLN
INTRAMUSCULAR | Status: DC | PRN
  Administered 2024-05-12: .2 mg via INTRAVENOUS

## 2024-05-12 MED ORDER — LIDOCAINE HCL (PF) 1 % IJ SOLN
INTRAMUSCULAR | Status: AC
  Filled 2024-05-12: qty 30

## 2024-05-12 MED ORDER — ACETAMINOPHEN 10 MG/ML IV SOLN
INTRAVENOUS | Status: AC
  Filled 2024-05-12: qty 100

## 2024-05-12 MED ORDER — PHENYLEPHRINE HCL-NACL 20-0.9 MG/250ML-% IV SOLN
INTRAVENOUS | Status: AC
  Filled 2024-05-12: qty 250

## 2024-05-12 MED ORDER — BUPIVACAINE LIPOSOME 1.3 % IJ SUSP
INTRAMUSCULAR | Status: DC | PRN
  Administered 2024-05-12: 40 mL

## 2024-05-12 MED ORDER — PROPOFOL 10 MG/ML IV BOLUS
INTRAVENOUS | Status: AC
  Filled 2024-05-12: qty 20

## 2024-05-12 MED ORDER — LIDOCAINE HCL (PF) 2 % IJ SOLN
INTRAMUSCULAR | Status: AC
  Filled 2024-05-12: qty 5

## 2024-05-12 MED ORDER — TRAMADOL HCL 50 MG PO TABS: 25.0000 mg | ORAL_TABLET | Freq: Four times a day (QID) | ORAL | 0 refills | Status: AC | PRN

## 2024-05-12 MED ORDER — PROPOFOL 10 MG/ML IV BOLUS
INTRAVENOUS | Status: DC | PRN
Start: 2024-05-12 — End: 2024-05-12
  Administered 2024-05-12: 160 mg via INTRAVENOUS

## 2024-05-12 MED ORDER — LIDOCAINE HCL (CARDIAC) PF 100 MG/5ML IV SOSY
PREFILLED_SYRINGE | INTRAVENOUS | Status: DC | PRN
  Administered 2024-05-12: 80 mg via INTRAVENOUS

## 2024-05-12 MED ORDER — BUPIVACAINE LIPOSOME 1.3 % IJ SUSP
INTRAMUSCULAR | Status: AC
  Filled 2024-05-12: qty 20

## 2024-05-12 MED ORDER — ACETAMINOPHEN 10 MG/ML IV SOLN: 1000.0000 mg | Freq: Once | INTRAVENOUS | Status: DC | PRN

## 2024-05-12 MED ORDER — BUPIVACAINE LIPOSOME 1.3 % IJ SUSP: INTRAMUSCULAR | Status: DC | PRN

## 2024-05-12 MED ORDER — DROPERIDOL 2.5 MG/ML IJ SOLN: 0.6250 mg | Freq: Once | INTRAMUSCULAR | Status: DC | PRN

## 2024-05-12 MED ORDER — ACETAMINOPHEN 500 MG PO TABS
1000.0000 mg | ORAL_TABLET | Freq: Once | ORAL | Status: AC
  Administered 2024-05-12: 1000 mg via ORAL

## 2024-05-12 MED ORDER — EPHEDRINE 5 MG/ML INJ
INTRAVENOUS | Status: AC
  Filled 2024-05-12: qty 5

## 2024-05-12 MED ORDER — CHLORHEXIDINE GLUCONATE 0.12 % MT SOLN
OROMUCOSAL | Status: AC
  Filled 2024-05-12: qty 15

## 2024-05-12 MED ORDER — BACITRACIN ZINC 500 UNIT/GM EX OINT
TOPICAL_OINTMENT | CUTANEOUS | Status: AC
Start: 2024-05-12 — End: ?
  Filled 2024-05-12: qty 28.35

## 2024-05-12 MED ORDER — CEFAZOLIN SODIUM-DEXTROSE 2-4 GM/100ML-% IV SOLN
INTRAVENOUS | Status: AC
  Filled 2024-05-12: qty 100

## 2024-05-12 MED ORDER — BUPIVACAINE HCL (PF) 0.5 % IJ SOLN: INTRAMUSCULAR | Status: DC | PRN

## 2024-05-12 MED ORDER — FENTANYL CITRATE (PF) 100 MCG/2ML IJ SOLN
INTRAMUSCULAR | Status: AC
  Filled 2024-05-12: qty 2

## 2024-05-12 MED ORDER — DEXAMETHASONE SODIUM PHOSPHATE 10 MG/ML IJ SOLN
INTRAMUSCULAR | Status: DC | PRN
  Administered 2024-05-12: 5 mg via INTRAVENOUS

## 2024-05-12 MED ORDER — STERILE WATER FOR IRRIGATION IR SOLN
Status: DC | PRN
Start: 2024-05-12 — End: 2024-05-12
  Administered 2024-05-12: 500 mL

## 2024-05-12 MED ORDER — BUPIVACAINE HCL (PF) 0.5 % IJ SOLN
INTRAMUSCULAR | Status: AC
  Filled 2024-05-12: qty 30

## 2024-05-12 MED ORDER — FENTANYL CITRATE (PF) 100 MCG/2ML IJ SOLN: 25.0000 ug | INTRAMUSCULAR | Status: DC | PRN

## 2024-05-12 MED ORDER — ONDANSETRON HCL 4 MG/2ML IJ SOLN
INTRAMUSCULAR | Status: DC | PRN
  Administered 2024-05-12: 4 mg via INTRAVENOUS

## 2024-05-12 SURGICAL SUPPLY — 32 items
BLADE CLIPPER SURG (BLADE) ×2 IMPLANT
BRIEF MESH DISP 2XL (UNDERPADS AND DIAPERS) ×2 IMPLANT
CHLORAPREP W/TINT 26 (MISCELLANEOUS) ×2 IMPLANT
DERMABOND ADVANCED .7 DNX12 (GAUZE/BANDAGES/DRESSINGS) IMPLANT
DRAIN PENROSE 12X.25 LTX STRL (MISCELLANEOUS) ×2 IMPLANT
DRAPE LAPAROTOMY 77X122 PED (DRAPES) ×2 IMPLANT
DRSG GAUZE FLUFF 36X18 (GAUZE/BANDAGES/DRESSINGS) ×2 IMPLANT
DRSG TELFA 3X4 N-ADH STERILE (GAUZE/BANDAGES/DRESSINGS) ×2 IMPLANT
ELECTRODE REM PT RTRN 9FT ADLT (ELECTROSURGICAL) ×2 IMPLANT
GAUZE 4X4 16PLY ~~LOC~~+RFID DBL (SPONGE) ×2 IMPLANT
GAUZE SPONGE 4X4 12PLY STRL (GAUZE/BANDAGES/DRESSINGS) IMPLANT
GLOVE BIO SURGEON STRL SZ 6.5 (GLOVE) IMPLANT
GLOVE BIO SURGEON STRL SZ8 (GLOVE) IMPLANT
GLOVE BIOGEL PI IND STRL 7.5 (GLOVE) ×2 IMPLANT
GOWN STRL REUS W/ TWL LRG LVL3 (GOWN DISPOSABLE) ×2 IMPLANT
GOWN STRL REUS W/ TWL XL LVL3 (GOWN DISPOSABLE) ×2 IMPLANT
KIT TURNOVER KIT A (KITS) ×2 IMPLANT
MANIFOLD NEPTUNE II (INSTRUMENTS) ×2 IMPLANT
NDL HYPO 25X1 1.5 SAFETY (NEEDLE) ×2 IMPLANT
NEEDLE HYPO 25X1 1.5 SAFETY (NEEDLE) ×1 IMPLANT
NS IRRIG 500ML POUR BTL (IV SOLUTION) ×2 IMPLANT
PACK BASIN MINOR ARMC (MISCELLANEOUS) ×2 IMPLANT
SPONGE KITTNER 5P (MISCELLANEOUS) ×2 IMPLANT
SPONGE T-LAP 18X18 ~~LOC~~+RFID (SPONGE) IMPLANT
SUPPORT SCROTAL MED STRP (MISCELLANEOUS) IMPLANT
SUPPORTER AHLETIC TETRA LG (SOFTGOODS) IMPLANT
SUT SILK 0 30XBRD TIE 6 (SUTURE) ×4 IMPLANT
SUT VIC AB 3-0 SH 27X BRD (SUTURE) ×4 IMPLANT
SUTURE EHLN 3-0 FS-10 30 BLK (SUTURE) IMPLANT
SUTURE MNCRL 4-0 27XMF (SUTURE) IMPLANT
TRAP FLUID SMOKE EVACUATOR (MISCELLANEOUS) ×2 IMPLANT
WATER STERILE IRR 500ML POUR (IV SOLUTION) ×2 IMPLANT

## 2024-05-12 NOTE — Op Note (Signed)
 Date of procedure: 05/12/24  Preoperative diagnosis:  Right testicular mass  Postoperative diagnosis:  Same  Procedure: Right radical orchiectomy  Surgeon: Jay Meth, MD  Anesthesia: General  Complications: None  Intraoperative findings:  Large 8 cm right testicular mass, challenging orchiectomy secondary to size and testicle adhered inferiorly to the scrotum, either from prior hydrocele repair or tumor extension  EBL: 10 mL  Specimens: Right testicle and spermatic cord, distal margin  Drains: None  Indication: [name] is a 83 y.o. patient with 8 cm right testicular mass, subtle possible left intratesticular masses, and extensive workup with oncology showing no evidence of metastatic disease and blood work negative for lymphoma or leukemia.  After tumor board conversation we opted to start with right radical orchiectomy for symptom improvement and tissue diagnosis.  We discussed that he Masse require left orchiectomy in the future as well as potential other systemic treatments.   After reviewing the management options for treatment, they elected to proceed with the above surgical procedure(s). We have discussed the potential benefits and risks of the procedure, side effects of the proposed treatment, the likelihood of the patient achieving the goals of the procedure, and any potential problems that might occur during the procedure or recuperation. Informed consent has been obtained.  Description of procedure:  The patient was taken to the operating room and general anesthesia was induced. SCDs were placed for DVT prophylaxis. The patient was placed in the supine position, prepped and draped in the usual sterile fashion, and preoperative antibiotics(Ancef ) were administered. A preoperative time-out was performed.   A 6 cm incision was made in the right lower groin and electrocautery used to dissect down to the level of the external oblique aponeurosis.  This was carefully  opened taking care to avoid the ilioinguinal nerve.  The spermatic cord was identified and 1/4 inch Penrose was secured around the proximal end of the cord.  The cord was then freed up distally towards the large testicular tumor.  This required extensive and meticulous dissection to allow the testicle to be delivered into the field.  The testicle was adhered to the inferior scrotum, unclear if this was from his prior hydrocele repair or represented local extension of tumor.  Extreme care was taken not to buttonhole the scrotum, and there was no evidence of any skin injury.  A final distal margin of abnormal appearing tissue that was adhered to the inferior scrotum was sent separately for permanent.  The right hemiscrotum was irrigated extensively with water.  Hemostasis was achieved.  No bleeding was noted.  I then turned my attention to the external oblique aponeurosis and this was closed using a running 3-0 Vicryl, again taking care to avoid the ilioinguinal nerve.  Long-acting local anesthetic was injected locally into the aponeurosis as well as the surrounding tissue and skin.  Scarpa's fascia was reapproximated with interrupted 3-0 Vicryl sutures.  The skin was closed with a running 4-0 Monocryl, and Dermabond applied.  A dressing of scrotal fluffs and jockstrap were placed.  Disposition: Stable to PACU  Plan: Follow-up pathology results, will contact patient to determine follow-up timing   Jay Meth, MD

## 2024-05-12 NOTE — Anesthesia Postprocedure Evaluation (Signed)
 Anesthesia Post Note  Patient: [name] Class  Procedure(s) Performed: ORCHIECTOMY (Right: Scrotum)  Patient location during evaluation: PACU Anesthesia Type: General Level of consciousness: awake and alert Pain management: pain level controlled Vital Signs Assessment: post-procedure vital signs reviewed and stable Respiratory status: spontaneous breathing, nonlabored ventilation and respiratory function stable Cardiovascular status: blood pressure returned to baseline and stable Postop Assessment: no apparent nausea or vomiting Anesthetic complications: no   No notable events documented.   Last Vitals:  Vitals:   05/12/24 1215 05/12/24 1232  BP: (!) 123/58 (!) 139/57  Pulse: 66 63  Resp: 16 18  Temp: (!) 36.2 C (!) 36.1 C  SpO2: 99% 100%    Last Pain:  Vitals:   05/12/24 1232  TempSrc: Temporal  PainSc: 0-No pain                 [name]

## 2024-05-12 NOTE — Transfer of Care (Signed)
 Immediate Anesthesia Transfer of Care Note  Patient: [name]  Procedure(s) Performed: ORCHIECTOMY (Right: Scrotum)  Patient Location: PACU  Anesthesia Type:General  Level of Consciousness: drowsy  Airway & Oxygen Therapy: Patient Spontanous Breathing and Patient connected to face mask oxygen  Post-op Assessment: Report given to RN and Post -op Vital signs reviewed and stable  Post vital signs: stable  Last Vitals:  Vitals Value Taken Time  BP 138/60 05/12/24 1153  Temp    Pulse 62 05/12/24 1156  Resp 12 05/12/24 1156  SpO2 100 % 05/12/24 1156  Vitals shown include unfiled device data.  Last Pain:  Vitals:   05/12/24 0946  TempSrc: Temporal  PainSc: 0-No pain         Complications: No notable events documented.

## 2024-05-12 NOTE — Anesthesia Preprocedure Evaluation (Addendum)
 Anesthesia Evaluation  Patient identified by MRN, date of birth, ID band Patient awake    Reviewed: Allergy & Precautions, H&P , NPO status , Patient's Chart, lab work & pertinent test results, reviewed documented beta blocker date and time   Airway Mallampati: II  TM Distance: >3 FB Neck ROM: full    Dental  (+) Caps   Pulmonary neg pulmonary ROS   Pulmonary exam normal        Cardiovascular hypertension, Pt. on home beta blockers and Pt. on medications + CAD, + Past MI, + CABG (CABG 2021: LIMA-LAD, SVG-RPDA, SVG-OM1, SVG-OM2.) and +CHF  Normal cardiovascular exam+ dysrhythmias (post op afib 2021) + Valvular Problems/Murmurs MR      Neuro/Psych negative neurological ROS  negative psych ROS   GI/Hepatic Neg liver ROS,GERD  ,,  Endo/Other  negative endocrine ROS    Renal/GU      Musculoskeletal  (+) Arthritis ,    Abdominal Normal abdominal exam  (+)   Peds  Hematology negative hematology ROS (+)   Anesthesia Other Findings Past Medical History: No date: Arthritis No date: Bilateral pleural effusion 2010: Bone spur     Comment:  right shoulder No date: CAD (coronary artery disease)     Comment:  a. 12/2019 NSTEMI/Cath: LM 99ost/m, LAD 40, LCX nl, OM1               80, RCA 70ost/p, 99p, 30d, EF 45-50%; b. 12/2019 CABG x 4               LIMA->LAD, VG->RPDA, VG->OM1->OM2. No date: Complication of anesthesia     Comment:  a.) delayed emergence; b.) emergence delirium No date: Diverticulosis No date: GERD (gastroesophageal reflux disease) No date: H/O atrial flutter No date: HFrEF (heart failure with reduced ejection fraction) (HCC)     Comment:  a.) TEE 01/08/2020: EF 45-50%; b.) TTE 04/14/2022: EF               50-55%, septal HK, G2DD, GLS /18.3%, mild-mod MR, AoV               sclerosis without stenosis. No date: Hyperlipidemia No date: Hypertension No date: Hyponatremia No date: IBS (irritable bowel  syndrome) No date: Ischemic cardiomyopathy     Comment:  a. 12/2019 TEE: EF 45-50%. Nl RV fxn. Mild MR. 01/07/2020: NSTEMI (non-ST elevated myocardial infarction) [facility])     Comment:  a.) LHC 01/08/2020: EF 45-50%, LVEDP 22 mmHg; 99% o-mLM,              80% OM1, 40% mLAD, 70% o-pRCA, 99% pRCA, 30% dRCA -->               consult CVTS; b.) 4v CABG 01/07/2022: LIMA-LAD, SVG-RPDA,              SVG-OM1, SVG-OM2. No date: Pancreatic lesion 01/11/2020: Postoperative atrial fibrillation (HCC)     Comment:  a.) POD3 following 4v CABG; 4 second pause prior to               spontaneous conversion to NSR 01/08/2020: S/P CABG x 4     Comment:  a.) 4v CABG 01/08/2020: LIMA-LAD, SVG-RPDA, SVG-OM1,               SVG-OM2. No date: Skin cancer     Comment:  a.) s/p Mohs surgery No date: Statin intolerance No date: Testicular mass  Past Surgical History: No date: BACK SURGERY     Comment:  Lumbar No date:  BUNIONECTOMY No date: CATARACT EXTRACTION, BILATERAL No date: COLONOSCOPY 01/08/2020: CORONARY ARTERY BYPASS GRAFT; N/A     Comment:  Procedure: CORONARY ARTERY BYPASS GRAFTING (CABG) x 4                WITH ENDOSCOPIC HARVESTING OF BILATERAL GREATER SAPHENOUS              VEINS;  Surgeon: Heriberto London, MD;  Location: [facility] OR;               Service: Open Heart Surgery;  Laterality: N/A; 2004: HEMORRHOID SURGERY No date: HYDROCELE EXCISION 01/2010: INGUINAL HERNIA REPAIR 01/08/2020: LEFT HEART CATH AND CORONARY ANGIOGRAPHY; N/A     Comment:  Procedure: LEFT HEART CATH AND CORONARY ANGIOGRAPHY;                Surgeon: Wenona Hamilton, MD;  Location: [facility] INVASIVE               CV LAB;  Service: Cardiovascular;  Laterality: N/A; 06/2010: ROTATOR CUFF REPAIR; Left     Comment:  left/ bone spur Dr.Blackman 05/26/2018: SHOULDER ARTHROSCOPY WITH OPEN ROTATOR CUFF REPAIR; Right     Comment:  Procedure: SHOULDER ARTHROSCOPY WITH OPEN ROTATOR CUFF               REPAIR;  Surgeon: Elner Hahn, MD;   Location: [facility] ORS;              Service: Orthopedics;  Laterality: Right;  debridement,               decompression 2003-2005: SKIN CANCER EXCISION     Comment:  Mohs-right shoulder 01/08/2020: TEE WITHOUT CARDIOVERSION; N/A     Comment:  Procedure: TRANSESOPHAGEAL ECHOCARDIOGRAM (TEE);                Surgeon: Matt Song, Donata Fryer, MD;  Location: [facility] OR;                Service: Open Heart Surgery;  Laterality: N/A; 07/14/2022: TOTAL HIP ARTHROPLASTY; Right     Comment:  Procedure: TOTAL HIP ARTHROPLASTY ANTERIOR APPROACH;                Surgeon: Molli Angelucci, MD;  Location: [facility] ORS;                Service: Orthopedics;  Laterality: Right;     Reproductive/Obstetrics negative OB ROS                              Anesthesia Physical Anesthesia Plan  ASA: 3  Anesthesia Plan: General LMA   Post-op Pain Management:    Induction: Intravenous  PONV Risk Score and Plan: Dexamethasone  and Ondansetron   Airway Management Planned: LMA  Additional Equipment:   Intra-op Plan:   Post-operative Plan: Extubation in OR  Informed Consent: I have reviewed the patients History and Physical, chart, labs and discussed the procedure including the risks, benefits and alternatives for the proposed anesthesia with the patient or authorized representative who has indicated his/her understanding and acceptance.     Dental Advisory Given  Plan Discussed with: Anesthesiologist, CRNA and Surgeon  Anesthesia Plan Comments:          Anesthesia Quick Evaluation

## 2024-05-12 NOTE — H&P (Signed)
 05/12/24 10:00 AM   [name] 07/02/1941 [id number]  CC: Right testicular mass  HPI: 83 year old male with at least 6 months of right testicular swelling and weight loss before presenting to clinic with exam showing an 8 cm right testicular mass, ultrasound confirmed a heterogenous right testicular mass concerning for malignancy, additionally had small hypoechoic and heterogenous abnormal areas within the left testicle concerning for malignancy.  Extensive workup with oncology with staging imaging and blood work showed no evidence of leukemia or lymphoma.  After tumor board meeting opted for right orchiectomy for symptom relief and tissue diagnosis.  He understands need for potential additional treatments or surgery on the left side in the future pending pathology results.   PMH: Past Medical History:  Diagnosis Date   Arthritis    Bilateral pleural effusion    Bone spur 2010   right shoulder   CAD (coronary artery disease)    a. 12/2019 NSTEMI/Cath: LM 99ost/m, LAD 40, LCX nl, OM1 80, RCA 70ost/p, 99p, 30d, EF 45-50%; b. 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   Complication of anesthesia    a.) delayed emergence; b.) emergence delirium   Diverticulosis    GERD (gastroesophageal reflux disease)    H/O atrial flutter    HFrEF (heart failure with reduced ejection fraction) (HCC)    a.) TEE 01/08/2020: EF 45-50%; b.) TTE 04/14/2022: EF 50-55%, septal HK, G2DD, GLS /18.3%, mild-mod MR, AoV sclerosis without stenosis.   Hyperlipidemia    Hypertension    Hyponatremia    IBS (irritable bowel syndrome)    Ischemic cardiomyopathy    a. 12/2019 TEE: EF 45-50%. Nl RV fxn. Mild MR.   NSTEMI (non-ST elevated myocardial infarction) (HCC) 01/07/2020   a.) LHC 01/08/2020: EF 45-50%, LVEDP 22 mmHg; 99% o-mLM, 80% OM1, 40% mLAD, 70% o-pRCA, 99% pRCA, 30% dRCA --> consult CVTS; b.) 4v CABG 01/07/2022: LIMA-LAD, SVG-RPDA, SVG-OM1, SVG-OM2.   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   Skin cancer    a.) s/p Mohs surgery   Statin intolerance    Testicular mass     Surgical History: Past Surgical History:  Procedure Laterality Date   BACK SURGERY     Lumbar   BUNIONECTOMY     CATARACT EXTRACTION, BILATERAL     COLONOSCOPY     CORONARY ARTERY BYPASS GRAFT N/A 01/08/2020   Procedure: CORONARY ARTERY BYPASS GRAFTING (CABG) x 4  WITH ENDOSCOPIC HARVESTING OF BILATERAL GREATER SAPHENOUS VEINS;  Surgeon: Heriberto London, MD;  Location: [facility] OR;  Service: Open Heart Surgery;  Laterality: N/A;   HEMORRHOID SURGERY  2004   HYDROCELE EXCISION     INGUINAL HERNIA REPAIR  01/2010   LEFT HEART CATH AND CORONARY ANGIOGRAPHY N/A 01/08/2020   Procedure: LEFT HEART CATH AND CORONARY ANGIOGRAPHY;  Surgeon: Wenona Hamilton, MD;  Location: [facility] INVASIVE CV LAB;  Service: Cardiovascular;  Laterality: N/A;   ROTATOR CUFF REPAIR Left 06/2010   left/ bone spur Dr.Blackman   SHOULDER ARTHROSCOPY WITH OPEN ROTATOR CUFF REPAIR Right 05/26/2018   Procedure: SHOULDER ARTHROSCOPY WITH OPEN ROTATOR CUFF REPAIR;  Surgeon: Elner Hahn, MD;  Location: [facility] ORS;  Service: Orthopedics;  Laterality: Right;  debridement, decompression   SKIN CANCER EXCISION  2003-2005   Mohs-right shoulder  TEE WITHOUT CARDIOVERSION N/A 01/08/2020   Procedure: TRANSESOPHAGEAL ECHOCARDIOGRAM (TEE);  Surgeon: Matt Song, Donata Fryer, MD;  [facility] OR;  Service: Open Heart Surgery;  Laterality: N/A;   TOTAL HIP ARTHROPLASTY Right 07/14/2022   Procedure: TOTAL HIP ARTHROPLASTY ANTERIOR APPROACH;  Surgeon: Molli Angelucci, MD;  Location: [facility] ORS;  Service: Orthopedics;  Laterality: Right;      Family History: Family History  Problem Relation Age of Onset   Heart disease Mother    Alzheimer's disease Mother     Heart attack Father    Heart disease Brother    Stroke Brother    Cancer Neg Hx    Diabetes Neg Hx    Colon cancer Neg Hx    Esophageal cancer Neg Hx    Stomach cancer Neg Hx    Pancreatic cancer Neg Hx     Social History:  reports that he has never smoked. He has never used smokeless tobacco. He reports that he does not currently use alcohol after a past usage of about 1.0 standard drink of alcohol per week. He reports that he does not use drugs.  Physical Exam: BP (!) 155/59   Pulse 60   Temp 97.8 F (36.6 C) (Temporal)   Ht 5\' 7"  (1.702 m)   Wt 58.1 kg   SpO2 100%   BMI 20.06 kg/m    Constitutional:  Alert and oriented, No acute distress. Cardiovascular: Regular rate and rhythm Respiratory: Clear to auscultation bilaterally GI: Abdomen is soft, nontender, nondistended, no abdominal masses   Assessment & Plan:   83 year old male with at least 6 months of right testicular swelling and weight loss before presenting to clinic with exam showing an 8 cm right testicular mass, ultrasound confirmed a heterogenous right testicular mass concerning for malignancy, additionally had small hypoechoic and heterogenous abnormal areas within the left testicle concerning for malignancy.  Extensive workup with oncology with staging imaging and blood work showed no evidence of leukemia or lymphoma.  After tumor board meeting opted for right orchiectomy for symptom relief and tissue diagnosis.  He understands need for potential additional treatments or surgery on the left side in the future pending pathology results.  We discussed the risk of bleeding, infection, groin pain, numbness, potential need for additional systemic procedures or potentially surgery on the left side pending pathology results.  Right radical orchiectomy today   Jay Meth, MD 05/12/2024  [facility] [address], [address] [place], [place] [address] [phone]

## 2024-05-13 ENCOUNTER — Encounter: Payer: Self-pay | Admitting: Urology

## 2024-05-17 ENCOUNTER — Ambulatory Visit
Admission: RE | Admit: 2024-05-17 | Discharge: 2024-05-17 | Disposition: A | Discharge status: Home / Self Care | Source: Ambulatory Visit | Attending: Oncology | Admitting: Oncology

## 2024-05-17 DIAGNOSIS — R221 Localized swelling, mass and lump, neck: Secondary | ICD-10-CM | POA: Diagnosis present

## 2024-05-19 ENCOUNTER — Ambulatory Visit: Payer: Self-pay | Admitting: Urology

## 2024-05-19 LAB — SURGICAL PATHOLOGY

## 2024-05-24 ENCOUNTER — Inpatient Hospital Stay: Attending: Oncology | Admitting: Hospice and Palliative Medicine

## 2024-05-24 ENCOUNTER — Encounter: Payer: Self-pay | Admitting: Oncology

## 2024-05-24 ENCOUNTER — Inpatient Hospital Stay: Admitting: Oncology

## 2024-05-24 VITALS — BP 152/60 | HR 56 | Temp 97.3°F | Resp 15 | Wt 134.0 lb

## 2024-05-24 DIAGNOSIS — Z79899 Other long term (current) drug therapy: Secondary | ICD-10-CM

## 2024-05-24 DIAGNOSIS — Z5181 Encounter for therapeutic drug level monitoring: Secondary | ICD-10-CM

## 2024-05-24 DIAGNOSIS — R748 Abnormal levels of other serum enzymes: Secondary | ICD-10-CM

## 2024-05-24 DIAGNOSIS — N5089 Other specified disorders of the male genital organs: Secondary | ICD-10-CM

## 2024-05-24 DIAGNOSIS — K869 Disease of pancreas, unspecified: Secondary | ICD-10-CM | POA: Diagnosis not present

## 2024-05-24 DIAGNOSIS — C83398 Diffuse large b-cell lymphoma of other extranodal and solid organ sites: Secondary | ICD-10-CM | POA: Diagnosis not present

## 2024-05-24 DIAGNOSIS — E871 Hypo-osmolality and hyponatremia: Secondary | ICD-10-CM | POA: Diagnosis not present

## 2024-05-24 DIAGNOSIS — R634 Abnormal weight loss: Secondary | ICD-10-CM | POA: Diagnosis not present

## 2024-05-24 DIAGNOSIS — Z5189 Encounter for other specified aftercare: Secondary | ICD-10-CM | POA: Insufficient documentation

## 2024-05-24 DIAGNOSIS — K8689 Other specified diseases of pancreas: Secondary | ICD-10-CM | POA: Diagnosis not present

## 2024-05-24 DIAGNOSIS — Z5112 Encounter for antineoplastic immunotherapy: Secondary | ICD-10-CM | POA: Insufficient documentation

## 2024-05-24 DIAGNOSIS — Z5111 Encounter for antineoplastic chemotherapy: Secondary | ICD-10-CM | POA: Diagnosis present

## 2024-05-24 DIAGNOSIS — C8580 Other specified types of non-Hodgkin lymphoma, unspecified site: Secondary | ICD-10-CM

## 2024-05-24 NOTE — Progress Notes (Signed)
 Multidisciplinary Oncology Council Documentation  Zachary Nole Lopata was presented by our [facility] on 05/24/2024, which included representatives from:  Palliative Care Dietitian  Physical/Occupational Therapist Nurse Navigator Genetics Social work Survivorship RN Financial Navigator Research RN   Rich Champ F currently presents with history of testicular mass  We reviewed previous medical and familial history, history of present illness, and recent lab results along with all available histopathologic and imaging studies. The MOC considered available treatment options and made the following recommendations/referrals:  Nutrition, SW  The MOC is a meeting of clinicians from various specialty areas who evaluate and discuss patients for whom a multidisciplinary approach is being considered. Final determinations in the plan of care are those of the provider(s).   Today's extended care, comprehensive team conference, Thoma Paulsen was not present for the discussion and was not examined.

## 2024-05-25 ENCOUNTER — Telehealth: Payer: Self-pay

## 2024-05-25 ENCOUNTER — Ambulatory Visit (INDEPENDENT_AMBULATORY_CARE_PROVIDER_SITE_OTHER): Admitting: Urology

## 2024-05-25 VITALS — BP 149/69 | HR 62 | Ht 66.0 in | Wt 131.4 lb

## 2024-05-25 DIAGNOSIS — C8580 Other specified types of non-Hodgkin lymphoma, unspecified site: Secondary | ICD-10-CM

## 2024-05-25 DIAGNOSIS — N5089 Other specified disorders of the male genital organs: Secondary | ICD-10-CM

## 2024-05-25 NOTE — Telephone Encounter (Signed)
 Pt scheduled for port placement on Friday 6/13 @ 10am, arrive 9a. Pt's wife aware and wrote down appt details.

## 2024-05-25 NOTE — Patient Instructions (Signed)
 Non-Hodgkin Lymphoma, Adult Non-Hodgkin lymphoma (NHL) is a cancer of the lymphatic system. The lymphatic system is part of the body's defense (immune) system. It protects the body from: Infections. Germs. Diseases. NHL affects a type of white blood cell called lymphocytes. There are many different types of NHL. NHL can occur in the B lymphocytes (B cells), T lymphocytes (T cells), and natural killer (NK) cells. The kind of NHL you have depends on the type of cells it affects and how quickly it grows and spreads. What are the causes? The cause of this condition is not known. What increases the risk? You are more likely to develop this condition if: You have a weak immune system, especially after receiving an organ from a donor (transplant). You have an autoimmune disease like rheumatoid arthritis. You have infections from bacteria or viruses. These include: Human immunodeficiency virus (HIV). Epstein-Barr virus. You are over the age of 20. You are male. You are Caucasian. You have been treated with high-energy beams that destroy cancer cells(radiation therapy) for other cancers. What are the signs or symptoms? Symptoms of this condition depend on the type, where it is in the body, and whether it is fast-growing or slow-growing. Some people Lacap not have any symptoms. Common symptoms include: Swelling of the collections of tissue that filter bacteria, viruses, and waste from the bloodstream (lymph nodes) in your neck, armpits, or groin. Fever. Unexplained weight loss. Night sweats. Tiredness (fatigue). How is this diagnosed? This condition Crawshaw be diagnosed based on: A physical exam and medical history. Chest X-ray. Testing of: Tissue from your lymph gland or bone marrow (biopsy). Spinal fluid (lumbar puncture or spinal tap). Abdominal or chest fluid. Blood. Urine. Imaging tests, such as: CT scan. PET scan (positron emission tomography). MRI. Your health care provider will do  more tests to determine whether the cancer has spread, where it has spread to, and how severe it is. This is called staging. You Icenogle need to have other tests to look for certain proteins or gene mutations that Babiarz help with treatment planning. How is this treated? Treatment for this condition depends on your symptoms, the type and stage of NHL, and how fast it is spreading. Treatment Macera include: Radiation therapy. Chemotherapy. This uses medicine to destroy cancer cells. Biological therapy. This helps to control the spread of cancer by boosting the body's immune system. Targeted medicines to treat specific proteins or gene mutations that are present in your lymphoma. Bone marrow or stem cell transplantation. In this procedure, you receive high doses of chemotherapy followed by a healthy bone marrow or stem cell transplant from a donor. Participating in clinical trials to see if new (experimental) treatments are effective. Surgery to remove the lymphoma. Blood or platelets from a donor (transfusion). This Cayton be done if your blood counts are low. Follow these instructions at home: Medicines Take over-the-counter and prescription medicines only as told by your health care provider. Do not take dietary supplements or herbal medicines unless your health care provider tells you to take them. Some supplements can interfere with how well your treatment works. Lifestyle Get enough sleep. Most adults need 6-8 hours of sleep each night. During treatment, you Peschke need more sleep. Consider joining a cancer support group. Ask your health care provider for more information about local and online support groups. General instructions  Drink enough fluid to keep your urine pale yellow. Wash your hands often with soap and water . If soap and water  are not available, use hand sanitizer.  Tell your cancer care team if you develop side effects from treatment. They Mercado be able to recommend ways to manage them. Keep  all follow-up visits as told by your health care provider. This is important. Where to find more information American Cancer Society: www.cancer.org Leukemia and Lymphoma Society: OrderTeeth.com.cy National Cancer Institute (NCI): www.cancer.gov Contact a health care provider if you: Have new lumps under your arms, on your neck, or near your groin. Have painful lymph nodes. Have nausea or vomiting. Have constipation or diarrhea. Have trouble urinating or painful urination. Have a skin rash. Have abdominal pain. Have joint pain. Feel dizzy or light-headed. Have a fever or chills. Get help right away if you: Have trouble breathing or chest pain. Have blood in your urine or stool. Summary Non-Hodgkin lymphoma (NHL) is a cancer of the lymphatic system. The lymphatic system is part of your body's defense (immune) system. Treatment for this condition depends on your symptoms, the type and stage of NHL, and how fast it is spreading. Tell your cancer care team if you develop side effects from treatment. They Stolp be able to recommend ways to manage them. Contact your health care provider if you have new lumps under your arms, on your neck, or near your groin. This information is not intended to replace advice given to you by your health care provider. Make sure you discuss any questions you have with your health care provider. Document Revised: 11/30/2022 Document Reviewed: 11/30/2022 Elsevier Patient Education  2024 [employer].

## 2024-05-25 NOTE — Progress Notes (Signed)
   05/25/2024 4:06 PM   Elfida Grinder Sanger Feb 26, 1941 [id number]  Reason for visit: Follow up right orchiectomy  HPI: 83 year old male who presented in late April 2025 with at least 1 year of worsening right-sided scrotal swelling, scrotal ultrasound showed a right large 7 cm mass concerning for malignancy, as well as multiple left-sided abnormalities concerning for malignancy.  He was referred to oncology who performed staging imaging which showed no evidence of metastatic disease, blood work was essentially benign, and they recommended proceeding with right-sided orchiectomy.  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  He had an appointment earlier this week with oncology and they recommended a PET scan which is pending, as well as port insertion and likely chemotherapy and potentially radiation as well.  He had a number of questions about treatment options and prognosis which I deferred to oncology, as B-cell lymphoma is not something we see your manage in urology.  Will need to follow-up left testicular lesions after systemic treatment, but would not anticipate need for left orchiectomy at this time.  His incision is healing well, some mild bruising in the scrotum as expected, his pain is well-controlled, taking just Tylenol  at this point.  Keep follow-up with oncology for further staging imaging and systemic treatment of B-cell lymphoma RTC 3 to 4 months symptom check   Lawerence Pressman, MD  [facility] [address], [address] [place], [place] [address] [phone]

## 2024-05-26 ENCOUNTER — Inpatient Hospital Stay

## 2024-05-26 NOTE — Addendum Note (Signed)
 Addended by: Gerilyn Kobus R on: 05/26/2024 11:39 AM   Modules accepted: Level of Service

## 2024-05-26 NOTE — Progress Notes (Signed)
 [facility] Clinical Social Work  Clinical Social Work was referred by [facility] for assessment of psychosocial needs.  Clinical Social Worker contacted caregiver by phone to offer support and assess for needs.    Spoke with patient's wife, [name], since patient was unavailable.  [name] kept her maiden name when she married patient.  Confirmed she and patient would be attending Chemo Ed on 6/16 and that several of her questions would be answered at that time.  She expressed no other needs at this time.  CSW provided contact information.     Johnathan Peal, LCSW  Clinical Social Worker [facility]

## 2024-05-27 NOTE — Progress Notes (Addendum)
 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 I recommend PET scan for staging.  The diagnosis and care plan were discussed with patient in detail.   Recommend R Mini CHOP, intrathecal chemotherapy, intrathecal methotrexate, radiation.  We had discussed the composition of chemotherapy regimen, length of chemo cycle, duration of treatment and the time to assess response to treatment.   I explained to the patient the risks and benefits of chemotherapy including all but not limited to hair loss, mouth sore, nausea, vomiting, heart failure, diarrhea, low blood counts, bleeding, neuropathy and risk of life threatening infection and even death, secondary malignancy etc.  .Patient and significant other voice understanding and would like to proceed with chemotherapy plan.  Obtain MUGA to evaluate baseline LVEF prior to chemotherapy Check hepatitis panel. HIV  # Chemotherapy education; Medi- port placement. Antiemetics-Zofran  and Compazine ; EMLA  cream sent to pharmacy  Supportive care measures are necessary for patient well-being and will be provided as necessary. We spent sufficient time to discuss many aspect of care, questions were answered to patient's satisfaction.    Pancreatic lesion Severe parenchymal atrophy of the pancreatic tail and body probably due to a 1.4 cm calcification/stone, chronic finding. Normal CA 19.9  Elevated alkaline phosphatase level Nonspecific lesion peripherally in the right hepatic lobe. Pending above workup.   Orders Placed This Encounter  Procedures   NM PET Image Initial (PI) Skull Base To Thigh    Standing Status:    Future    Expected Date:   05/31/2024    Expiration Date:   05/24/2025    If indicated for the ordered procedure, I authorize the administration of a radiopharmaceutical per Radiology protocol:   Yes    Preferred imaging location?:   [facility]   NM Cardiac [place]    Standing Status:   Future    Expected Date:   05/31/2024    Expiration Date:   05/24/2025    Preferred imaging location?:   [facility]   IR IMAGING GUIDED PORT INSERTION    Standing Status:   Future    Expected Date:   05/31/2024    Expiration Date:   05/24/2025    Reason for Exam (SYMPTOM  OR DIAGNOSIS REQUIRED):   port a cath placement for chemo    Preferred Imaging Location?:   [facility]   ECHOCARDIOGRAM COMPLETE    Standing Status:   Future    Expiration Date:   05/24/2025    Where should this test be performed:   [facility]    Perflutren DEFINITY (image enhancing agent) should be administered unless hypersensitivity or allergy exist:   Administer Perflutren    Reason for exam-Echo:   Chemo  Z09   Follow up TBD All questions were answered. The patient knows to call the clinic with any problems, questions or concerns.  Timmy Forbes, MD, PhD [facility] Hematology Oncology 05/24/2024    HISTORY OF PRESENTING ILLNESS:  [name] 82 y.o. male presents to establish care for testicular lymphoma.   Oncology History  Lymphoma, large cell (HCC)  04/25/2024 Imaging   ultrasound scrotum with Doppler showed Diffusely heterogeneous right testicular echotexture, most consistent with dominant mass or masses. Multiple left testicular masses.  Favor lymphoproliferative process such as lymphoma or leukemia. Bilateral primary testicular neoplasms felt less likely.   Right epididymal enlargement and heterogeneity, favoring epididymitis. The appearance of the testicles is felt unlikely to be be infectious/inflammatory.   04/28/2024 Imaging   CT chest with contrast abdomen pelvis with and without contrast  showed 1. Heterogeneous enhancement in both testicles with substantial abnormal enlargement of the right testicle. This is suspicious for testicular neoplasm with lymphoblastic leukemia, lymphoma, or conceivably metastatic disease as top differential diagnostic considerations. Testicular adrenal rest tumors are a differential diagnostic consideration. 2. No overtly pathologic adenopathy identified. 3. Severe parenchymal atrophy of the pancreatic tail and body probably due to a 1.4 cm calcification/stone along the dorsal pancreatic duct at the junction of the body and head. Similar severe atrophy was shown on the CT chest from 01/07/2020. 4. Branching density in the superior segment right lower lobe favoring bronchiectasis with airway plugging. 5. Small type 1 hiatal hernia. 6. Prominent stool throughout the colon favors constipation. 7. Chronic appearing anterior wedge compressions at L1 and L2. 8. Lumbar spondylosis and degenerative disc disease at L5-S1 resulting in mild bilateral foraminal impingement. 9.  Aortic Atherosclerosis (ICD10-I70.0). 10. Nonspecific lesion peripherally in the right hepatic lobe. This could be further characterized with hepatic protocol MRI with and without contrast.   05/04/2024 Initial Diagnosis   Testicular lymphoma, large cell   He has experienced scrotal swelling since a hydrocele repair in 2022. The swelling decreased slightly post-surgery but never fully resolved. Recently, he received a report indicating concerns in both testicles, prompting further evaluation.    He reports neck soreness and swelling, initially attributed to physical activity such as painting and building a fence. The neck soreness is described as 'almost miserable.'   He mentions unintentional weight loss, noting a decrease from his usual weight of 135-140 pounds to 122 pounds over the past year. Despite efforts to regain weight, including going on cruises, he has not been  successful. He currently weighs 133 pounds with clothes on.   No excessive night sweats. He feels cold most of the time but has experienced some fever. No family history of cancer but there is a family history of high cholesterol and heart problems. He has not had a PSA test but underwent a colonoscopy about three years ago.    05/24/2024 Cancer Staging   Staging form: Hodgkin and Non-Hodgkin Lymphoma, AJCC 8th Edition - Clinical stage from 05/24/2024: Diffuse large B-cell lymphoma - Signed by Timmy Forbes, MD on 05/24/2024 Stage prefix: Initial diagnosis    Today patient presents to discuss results. He expressed frustration of going through multiple workup process. No new complaints.     MEDICAL HISTORY:  Past Medical History:  Diagnosis Date   Arthritis    Bilateral pleural effusion    Bone spur 2010   right shoulder   CAD (coronary artery disease)    a. 12/2019 NSTEMI/Cath: LM 99ost/m, LAD 40, LCX nl, OM1 80, RCA 70ost/p, 99p, 30d, EF 45-50%; b. 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   Complication of anesthesia    a.) delayed emergence; b.) emergence delirium   Diverticulosis    GERD (gastroesophageal reflux disease)    H/O atrial flutter    HFrEF (heart failure with reduced ejection fraction) (HCC)    a.) TEE 01/08/2020: EF 45-50%; b.) TTE 04/14/2022: EF 50-55%, septal HK, G2DD, GLS /18.3%, mild-mod MR, AoV sclerosis without stenosis.   Hyperlipidemia    Hypertension    Hyponatremia  IBS (irritable bowel syndrome)    Ischemic cardiomyopathy    a. 12/2019 TEE: EF 45-50%. Nl RV fxn. Mild MR.   NSTEMI (non-ST elevated myocardial infarction) (HCC) 01/07/2020   a.) LHC 01/08/2020: EF 45-50%, LVEDP 22 mmHg; 99% o-mLM, 80% OM1, 40% mLAD, 70% o-pRCA, 99% pRCA, 30% dRCA --> consult CVTS; b.) 4v CABG 01/07/2022: LIMA-LAD, SVG-RPDA, SVG-OM1, SVG-OM2.   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   Skin cancer    a.) s/p Mohs surgery   Statin intolerance    Testicular mass     SURGICAL HISTORY: Past Surgical History:  Procedure Laterality Date   BACK SURGERY     Lumbar   BUNIONECTOMY     CATARACT EXTRACTION, BILATERAL     COLONOSCOPY     CORONARY ARTERY BYPASS GRAFT N/A 01/08/2020   Procedure: CORONARY ARTERY BYPASS GRAFTING (CABG) x 4  WITH ENDOSCOPIC HARVESTING OF BILATERAL GREATER SAPHENOUS VEINS;  Surgeon: Heriberto London, MD;  Location: [facility] OR;  Service: Open Heart Surgery;  Laterality: N/A;   HEMORRHOID SURGERY  2004   HYDROCELE EXCISION     INGUINAL HERNIA REPAIR  01/2010   LEFT HEART CATH AND CORONARY ANGIOGRAPHY N/A 01/08/2020   Procedure: LEFT HEART CATH AND CORONARY ANGIOGRAPHY;  Surgeon: Wenona Hamilton, MD;  Location: [facility] INVASIVE CV LAB;  Service: Cardiovascular;  Laterality: N/A;   ORCHIECTOMY Right 05/12/2024   Procedure: ORCHIECTOMY;  Surgeon: Lawerence Pressman, MD;  Location: [facility] ORS;  Service: Urology;  Laterality: Right;   ROTATOR CUFF REPAIR Left 06/2010   left/ bone spur Dr.Blackman   SHOULDER ARTHROSCOPY WITH OPEN ROTATOR CUFF REPAIR Right 05/26/2018   Procedure: SHOULDER ARTHROSCOPY WITH OPEN ROTATOR CUFF REPAIR;  Surgeon: Elner Hahn, MD;  Location: [facility] ORS;  Service: Orthopedics;  Laterality: Right;  debridement, decompression   SKIN CANCER EXCISION  2003-2005   Mohs-right shoulder   TEE WITHOUT CARDIOVERSION N/A 01/08/2020   Procedure: TRANSESOPHAGEAL ECHOCARDIOGRAM (TEE);  Surgeon: Matt Song, Donata Fryer, MD;  Location: [facility] OR;  Service: Open Heart Surgery;  Laterality: N/A;   TOTAL HIP ARTHROPLASTY Right 07/14/2022   Procedure: TOTAL HIP ARTHROPLASTY ANTERIOR APPROACH;  Surgeon: Molli Angelucci, MD;  Location: [facility] ORS;  Service: Orthopedics;  Laterality: Right;    SOCIAL HISTORY: Social History   Socioeconomic History    Marital status: Married    Spouse name: Not on file   Number of children: 2   Years of education: Not on file   Highest education level: Not on file  Occupational History   Occupation: retired Academic librarian: retired  Tobacco Use   Smoking status: Never   Smokeless tobacco: Never  Vaping Use   Vaping status: Never Used  Substance and Sexual Activity   Alcohol use: Not Currently    Alcohol/week: 1.0 standard drink of alcohol    Types: 1 Glasses of wine per week    Comment: very rare   Drug use: No   Sexual activity: Not on file  Other Topics Concern   Not on file  Social History Narrative   2 sons, no contact   Splits his time between [place]  and [place]    Has live-in since 2005   Has living will   Requests Bernice--girlfriend or brother  Porfirio Bristol, as health care POA   Would accept resuscitation but no prolonged artificial ventilation   Wouldn't want tube feeds if cognitively unaware   Social Drivers of Health   Financial Resource Strain: Low Risk  (04/05/2024)   Received from [facility]   Overall Financial Resource Strain (CARDIA)    Difficulty of Paying Living Expenses: Not hard at all  Food Insecurity: No Food Insecurity (04/05/2024)   Received from [facility]   Hunger Vital Sign    Worried About Running Out of Food in the Last Year: Never true    Ran Out of Food in the Last Year: Never true  Transportation Needs: No Transportation Needs (04/05/2024)   Received from [facility] - Transportation    In the past 12 months, has lack of transportation kept you from medical appointments or from getting medications?: No    Lack of Transportation (Non-Medical): No  Physical Activity: Not on file  Stress: Not on file  Social Connections: Not on file  Intimate Partner Violence: Not on file    FAMILY HISTORY: Family History  Problem Relation Age of Onset   Heart disease Mother     Alzheimer's disease Mother    Heart attack Father    Heart disease Brother    Stroke Brother    Cancer Neg Hx    Diabetes Neg Hx    Colon cancer Neg Hx    Esophageal cancer Neg Hx    Stomach cancer Neg Hx    Pancreatic cancer Neg Hx     ALLERGIES:  is allergic to crestor [rosuvastatin], penicillins, peanut-containing drug products, atorvastatin, and simvastatin.  MEDICATIONS:  Current Outpatient Medications  Medication Sig Dispense Refill   acetaminophen  (TYLENOL ) 500 MG tablet Take 500-1,000 mg by mouth every 6 (six) hours as needed for moderate pain (pain score 4-6).     docusate sodium  (COLACE) 100 MG capsule Take 1 capsule (100 mg total) by mouth 2 (two) times daily. (Patient taking differently: Take 100 mg by mouth every evening.) 10 capsule 0   Evolocumab  (REPATHA  SURECLICK) 140 MG/ML SOAJ Inject 140 mg into the skin every 14 (fourteen) days. 2 mL 11   metoprolol  succinate (TOPROL -XL) 25 MG 24 hr tablet Take 0.5 tablets (12.5 mg total) by mouth daily. (Patient taking differently: Take 12.5 mg by mouth every morning.) 45 tablet 3   nitroGLYCERIN  (NITROSTAT ) 0.4 MG SL tablet Place 0.4 mg under the tongue every 5 (five) minutes as needed for chest pain.     amLODipine  (NORVASC ) 5 MG tablet Take 1 tablet (5 mg total) by mouth daily. (Patient taking differently: Take 5 mg by mouth every morning.) 180 tablet 3   omega-3 acid ethyl esters (LOVAZA ) 1 g capsule Take 1 g by mouth every other day.     tadalafil  (CIALIS ) 10 MG tablet Take 1-2 tablets (10-20 mg total) by mouth daily as needed for erectile dysfunction (take 1 hour prior to sexual activity). 30 tablet 11   traMADol  (ULTRAM ) 50 MG tablet Take 1 tablet (50 mg total) by mouth every 6 (six) hours. 30 tablet 0   No current facility-administered medications for this visit.    Review of Systems  Constitutional:  Positive for appetite change, fatigue and unexpected weight change. Negative for chills and fever.  HENT:   Negative  for hearing loss and voice change.   Eyes:  Negative for eye problems and icterus.  Respiratory:  Negative for chest tightness, cough  and shortness of breath.   Cardiovascular:  Negative for chest pain and leg swelling.  Gastrointestinal:  Negative for abdominal distention and abdominal pain.  Endocrine: Negative for hot flashes.  Genitourinary:  Negative for difficulty urinating, dysuria and frequency.        Status post right orchiectomy  Musculoskeletal:  Negative for arthralgias.  Skin:  Negative for itching and rash.  Neurological:  Negative for light-headedness and numbness.  Hematological:  Negative for adenopathy. Does not bruise/bleed easily.  Psychiatric/Behavioral:  Negative for confusion.      PHYSICAL EXAMINATION: ECOG PERFORMANCE STATUS: 1 - Symptomatic but completely ambulatory  Vitals:   05/24/24 1307  BP: (!) 152/60  Pulse: (!) 56  Resp: 15  Temp: (!) 97.3 F (36.3 C)  SpO2: 100%   Filed Weights   05/24/24 1307  Weight: 134 lb (60.8 kg)    Physical Exam Constitutional:      General: He is not in acute distress.    Appearance: He is not diaphoretic.  HENT:     Head: Normocephalic and atraumatic.  Eyes:     General: No scleral icterus. Cardiovascular:     Rate and Rhythm: Normal rate and regular rhythm.     Heart sounds: No murmur heard. Pulmonary:     Effort: Pulmonary effort is normal. No respiratory distress.     Breath sounds: No wheezing.  Abdominal:     General: There is no distension.     Palpations: Abdomen is soft.     Tenderness: There is no abdominal tenderness.  Musculoskeletal:        General: Normal range of motion.     Cervical back: Normal range of motion and neck supple.  Skin:    General: Skin is warm and dry.     Findings: No erythema.  Neurological:     Mental Status: He is alert and oriented to person, place, and time. Mental status is at baseline.     Motor: No abnormal muscle tone.  Psychiatric:        Mood and Affect:  Affect normal.     LABORATORY DATA:  I have reviewed the data as listed    Latest Ref Rng & Units 05/04/2024    4:13 PM 09/01/2023   10:07 AM 07/16/2022    3:58 AM  CBC  WBC 4.0 - 10.5 K/uL 8.4  5.7  12.8   Hemoglobin 13.0 - 17.0 g/dL 09.8  11.9  9.5   Hematocrit 39.0 - 52.0 % 40.6  42.1  27.7   Platelets 150 - 400 K/uL 278  210  200       Latest Ref Rng & Units 05/04/2024    4:13 PM 09/01/2023   10:07 AM 07/15/2022    6:00 AM  CMP  Glucose 70 - 99 mg/dL 147  95  829   BUN 8 - 23 mg/dL 13  7  10    Creatinine 0.61 - 1.24 mg/dL 5.62  1.30  8.65   Sodium 135 - 145 mmol/L 132  133  130   Potassium 3.5 - 5.1 mmol/L 4.0  4.1  4.5   Chloride 98 - 111 mmol/L 95  96  99   CO2 22 - 32 mmol/L 25  23  25    Calcium  8.9 - 10.3 mg/dL 9.3  9.3  8.1   Total Protein 6.5 - 8.1 g/dL 8.5     Total Bilirubin 0.0 - 1.2 mg/dL 1.4     Alkaline Phos 38 - 126 U/L  178     AST 15 - 41 U/L 28     ALT 0 - 44 U/L 21        RADIOGRAPHIC STUDIES: I have personally reviewed the radiological images as listed and agreed with the findings in the report. US  Soft Tissue Head/Neck Result Date: 05/21/2024 CLINICAL DATA:  83 year old male with neck swelling and pain. EXAM: ULTRASOUND OF HEAD/NECK SOFT TISSUES TECHNIQUE: Ultrasound examination of the head and neck soft tissues was performed in the area of clinical concern. COMPARISON:  None Available. FINDINGS: No sonographic abnormality in the visualized neck. No cervical lymphadenopathy. IMPRESSION: No sonographic abnormality in the visualized neck. Electronically Signed   By: Creasie Doctor M.D.   On: 05/21/2024 08:08   CT ABDOMEN PELVIS W WO CONTRAST Result Date: 04/28/2024 CLINICAL DATA:  Hydrocele removal in 2024. Swelling and discomfort of the right scrotum oval past 3-4 months. Bilateral scrotal masses on ultrasound. EXAM: CT CHEST WITH CONTRAST CT ABDOMEN AND PELVIS WITH AND WITHOUT CONTRAST TECHNIQUE: Multidetector CT imaging of the chest was performed during  intravenous contrast administration. Multidetector CT imaging of the abdomen and pelvis was performed following the standard protocol before and during bolus administration of intravenous contrast. RADIATION DOSE REDUCTION: This exam was performed according to the departmental dose-optimization program which includes automated exposure control, adjustment of the mA and/or kV according to patient size and/or use of iterative reconstruction technique. CONTRAST:  OMNIPAQUE  IOHEXOL  300 MG/ML  SOLN COMPARISON:  Scrotal ultrasound 04/25/2024 FINDINGS: CT CHEST FINDINGS Cardiovascular: INSERT chest prior CABG. Mediastinum/Nodes: Small type 1 hiatal hernia. 0.9 cm right paratracheal node on image 58 series 11. 1.0 cm left paratracheal lymph node on image 25 series 9. Small bilateral infrahilar lymph nodes. Lungs/Pleura: Branching density in the superior segment right lower lobe favoring bronchiectasis with airway plugging on images 69-72 of series 10. Musculoskeletal: Thoracic spondylosis with multilevel anterior bridging spurring. Grade 1 anterolisthesis at C6-7. CT ABDOMEN AND PELVIS FINDINGS Hepatobiliary: There are a few scattered sub-centimeter nodules in the liver which are too small to characterize although statistically likely to be benign lesions such as cysts. Peripherally in the right hepatic lobe a 1.6 by 1.0 cm bilobed lesion has faint calcification in a linear manner traversing the central portion, possibly along a septation. This Tamer represent a cystic lesion with internal nodularity or 2 adjacent cysts, but is technically nonspecific. A peripheral subcapsular calcification posteriorly in the right hepatic lobe on image 9 series 16 is likely postinflammatory. Gallbladder unremarkable. Pancreas: Severe parenchymal atrophy of the pancreatic tail and body along with dorsal pancreatic duct dilatation up to 1.1 cm extending to a 1.4 by 0.9 cm dense calcification along the dorsal pancreatic duct. 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 It is possible that this represents a postinflammatory pancreatic calcification which obstructed the dorsal pancreatic duct leading to severe atrophy of the pancreatic body and tail. Spleen: Unremarkable Adrenals/Urinary Tract: 2 mm left mid kidney nonobstructive renal calculus. Otherwise unremarkable. Stomach/Bowel: Small posterior gastric diverticulum. Prominent stool throughout the colon favors constipation. Vascular/Lymphatic: Atherosclerosis is present, including aortoiliac atherosclerotic disease. Atheromatous plaque in the celiac trunk and SMA without occlusion. No overtly pathologic adenopathy identified. A small left inguinal node measures 0.7 cm in short axis on image 111 series 9 and an elongated but narrow right inguinal lymph node measures 0.8 cm in short axis on image 109 series 9. Reproductive:  Heterogeneous enhancement in both testicles with substantial abnormal enlargement of the right testicle. Mild prostatomegaly indenting the bladder base. Other: No supplemental non-categorized findings. Musculoskeletal: Chronic appearing anterior wedge compressions at L1 and L2. Slightly transitional S1 vertebra. Right total hip prosthesis. Lumbar spondylosis and degenerative disc disease at L5-S1 resulting in mild bilateral foraminal impingement. IMPRESSION: 1. Heterogeneous enhancement in both testicles with substantial abnormal enlargement of the right testicle. This is suspicious for testicular neoplasm with lymphoblastic leukemia, lymphoma, or conceivably metastatic disease as top differential diagnostic considerations. Testicular adrenal rest tumors are a differential diagnostic consideration. 2. No overtly pathologic adenopathy identified. 3. Severe parenchymal atrophy of the pancreatic tail and body probably due to a 1.4  cm calcification/stone along the dorsal pancreatic duct at the junction of the body and head. Similar severe atrophy was shown on the CT chest from 01/07/2020. 4. Branching density in the superior segment right lower lobe favoring bronchiectasis with airway plugging. 5. Small type 1 hiatal hernia. 6. Prominent stool throughout the colon favors constipation. 7. Chronic appearing anterior wedge compressions at L1 and L2. 8. Lumbar spondylosis and degenerative disc disease at L5-S1 resulting in mild bilateral foraminal impingement. 9.  Aortic Atherosclerosis (ICD10-I70.0). 10. Nonspecific lesion peripherally in the right hepatic lobe. This could be further characterized with hepatic protocol MRI with and without contrast. Electronically Signed   By: Freida Jes M.D.   On: 04/28/2024 10:09   CT CHEST W CONTRAST Result Date: 04/28/2024 CLINICAL DATA:  Hydrocele removal in 2024. Swelling and discomfort of the right scrotum oval past 3-4 months. Bilateral scrotal masses on ultrasound. EXAM: CT CHEST WITH CONTRAST CT ABDOMEN AND PELVIS WITH AND WITHOUT CONTRAST TECHNIQUE: Multidetector CT imaging of the chest was performed during intravenous contrast administration. Multidetector CT imaging of the abdomen and pelvis was performed following the standard protocol before and during bolus administration of intravenous contrast. RADIATION DOSE REDUCTION: This exam was performed according to the departmental dose-optimization program which includes automated exposure control, adjustment of the mA and/or kV according to patient size and/or use of iterative reconstruction technique. CONTRAST:  OMNIPAQUE  IOHEXOL  300 MG/ML  SOLN COMPARISON:  Scrotal ultrasound 04/25/2024 FINDINGS: CT CHEST FINDINGS Cardiovascular: INSERT chest prior CABG. Mediastinum/Nodes: Small type 1 hiatal hernia. 0.9 cm right paratracheal node on image 58 series 11. 1.0 cm left paratracheal lymph node on image 25 series 9. Small bilateral  infrahilar lymph nodes. Lungs/Pleura: Branching density in the superior segment right lower lobe favoring bronchiectasis with airway plugging on images 69-72 of series 10. Musculoskeletal: Thoracic spondylosis with multilevel anterior bridging spurring. Grade 1 anterolisthesis at C6-7. CT ABDOMEN AND PELVIS FINDINGS Hepatobiliary: There are a few scattered sub-centimeter nodules in the liver which are too small to characterize although statistically likely to be benign lesions such as cysts. Peripherally in the right hepatic lobe a 1.6 by 1.0 cm bilobed lesion has faint calcification in a linear manner traversing the central portion, possibly along a septation. This [place] represent a cystic lesion with internal nodularity or 2 adjacent cysts, but is technically nonspecific. A peripheral subcapsular calcification posteriorly in the right hepatic lobe on image 9 series 16 is likely postinflammatory. Gallbladder unremarkable. Pancreas: Severe parenchymal atrophy of the pancreatic tail and body along with dorsal pancreatic duct dilatation up to 1.1 cm extending to a 1.4 by 0.9 cm dense calcification along the dorsal pancreatic duct. The pancreatic head and uncinate process proximal to this large calcific structure is normal caliber although with some punctate calcifications indicative of chronic  calcific pancreatitis. It is possible that this represents a postinflammatory pancreatic calcification which obstructed the dorsal pancreatic duct leading to severe atrophy of the pancreatic body and tail. Spleen: Unremarkable Adrenals/Urinary Tract: 2 mm left mid kidney nonobstructive renal calculus. Otherwise unremarkable. Stomach/Bowel: Small posterior gastric diverticulum. Prominent stool throughout the colon favors constipation. Vascular/Lymphatic: Atherosclerosis is present, including aortoiliac atherosclerotic disease. Atheromatous plaque in the celiac trunk and SMA without occlusion. No overtly pathologic adenopathy  identified. A small left inguinal node measures 0.7 cm in short axis on image 111 series 9 and an elongated but narrow right inguinal lymph node measures 0.8 cm in short axis on image 109 series 9. Reproductive: Heterogeneous enhancement in both testicles with substantial abnormal enlargement of the right testicle. Mild prostatomegaly indenting the bladder base. Other: No supplemental non-categorized findings. Musculoskeletal: Chronic appearing anterior wedge compressions at L1 and L2. Slightly transitional S1 vertebra. Right total hip prosthesis. Lumbar spondylosis and degenerative disc disease at L5-S1 resulting in mild bilateral foraminal impingement. IMPRESSION: 1. Heterogeneous enhancement in both testicles with substantial abnormal enlargement of the right testicle. This is suspicious for testicular neoplasm with lymphoblastic leukemia, lymphoma, or conceivably metastatic disease as top differential diagnostic considerations. Testicular adrenal rest tumors are a differential diagnostic consideration. 2. No overtly pathologic adenopathy identified. 3. Severe parenchymal atrophy of the pancreatic tail and body probably due to a 1.4 cm calcification/stone along the dorsal pancreatic duct at the junction of the body and head. Similar severe atrophy was shown on the CT chest from 01/07/2020. 4. Branching density in the superior segment right lower lobe favoring bronchiectasis with airway plugging. 5. Small type 1 hiatal hernia. 6. Prominent stool throughout the colon favors constipation. 7. Chronic appearing anterior wedge compressions at L1 and L2. 8. Lumbar spondylosis and degenerative disc disease at L5-S1 resulting in mild bilateral foraminal impingement. 9.  Aortic Atherosclerosis (ICD10-I70.0). 10. Nonspecific lesion peripherally in the right hepatic lobe. This could be further characterized with hepatic protocol MRI with and without contrast. Electronically Signed   By: Freida Jes M.D.   On:  04/28/2024 10:09

## 2024-05-27 NOTE — Assessment & Plan Note (Signed)
 Severe parenchymal atrophy of the pancreatic tail and body probably due to a 1.4 cm calcification/stone, chronic finding. Normal CA 19.9

## 2024-05-27 NOTE — Assessment & Plan Note (Addendum)
 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 I recommend PET scan for staging.  The diagnosis and care plan were discussed with patient in detail.   Recommend R Mini CHOP, intrathecal chemotherapy, intrathecal methotrexate, radiation.  We had discussed the composition of chemotherapy regimen, length of chemo cycle, duration of treatment and the time to assess response to treatment.   I explained to the patient the risks and benefits of chemotherapy including all but not limited to hair loss, mouth sore, nausea, vomiting, heart failure, diarrhea, low blood counts, bleeding, neuropathy and risk of life threatening infection and even death, secondary malignancy etc.  .Patient and significant other voice understanding and would like to proceed with chemotherapy plan.  Obtain MUGA to evaluate baseline LVEF prior to chemotherapy Check hepatitis panel. HIV  # Chemotherapy education; Medi- port placement. Antiemetics-Zofran  and Compazine ; EMLA  cream sent to pharmacy  Supportive care measures are necessary for patient well-being and will be provided as necessary. We spent sufficient time to discuss many aspect of care, questions were answered to patient's satisfaction.

## 2024-05-27 NOTE — Assessment & Plan Note (Signed)
 Nonspecific lesion peripherally in the right hepatic lobe. Pending above workup.

## 2024-05-29 ENCOUNTER — Inpatient Hospital Stay

## 2024-05-30 ENCOUNTER — Other Ambulatory Visit: Payer: Self-pay | Admitting: Radiology

## 2024-05-30 ENCOUNTER — Telehealth: Payer: Self-pay | Admitting: *Deleted

## 2024-05-30 ENCOUNTER — Ambulatory Visit
Admission: RE | Admit: 2024-05-30 | Discharge: 2024-05-30 | Disposition: A | Discharge status: Home / Self Care | Source: Ambulatory Visit | Attending: Oncology | Admitting: Oncology

## 2024-05-30 ENCOUNTER — Telehealth: Payer: Self-pay | Admitting: Oncology

## 2024-05-30 DIAGNOSIS — C8590 Non-Hodgkin lymphoma, unspecified, unspecified site: Secondary | ICD-10-CM | POA: Insufficient documentation

## 2024-05-30 DIAGNOSIS — Z85828 Personal history of other malignant neoplasm of skin: Secondary | ICD-10-CM | POA: Diagnosis not present

## 2024-05-30 DIAGNOSIS — N5089 Other specified disorders of the male genital organs: Secondary | ICD-10-CM | POA: Insufficient documentation

## 2024-05-30 LAB — GLUCOSE, CAPILLARY: Glucose-Capillary: 97 mg/dL (ref 70–99)

## 2024-05-30 MED ORDER — FLUDEOXYGLUCOSE F - 18 (FDG) INJECTION
7.2300 | Freq: Once | INTRAVENOUS | Status: AC | PRN
  Administered 2024-05-30: 7.23 via INTRAVENOUS

## 2024-05-30 NOTE — Telephone Encounter (Signed)
 Insurance will not pay for MUGA so they had to cancel that and now it is going to be with echo.  [place]  The staff did say that if there any cancellations they will call the patient and ask if they want to take their spot.  The wife understands that

## 2024-05-30 NOTE — Telephone Encounter (Signed)
 Due to insurance the MUGA on 06/01/24 had to be canceled. An ECHO is now scheduled for 07/18/24. I called and left vm for pt about the change and that everything can be viewed in mychart

## 2024-06-01 ENCOUNTER — Other Ambulatory Visit: Payer: Self-pay | Admitting: Oncology

## 2024-06-01 ENCOUNTER — Encounter: Payer: Self-pay | Admitting: Oncology

## 2024-06-01 ENCOUNTER — Ambulatory Visit

## 2024-06-01 DIAGNOSIS — C8580 Other specified types of non-Hodgkin lymphoma, unspecified site: Secondary | ICD-10-CM

## 2024-06-01 MED ORDER — ALLOPURINOL 300 MG PO TABS: 300.0000 mg | ORAL_TABLET | Freq: Every day | ORAL | 3 refills | Status: DC

## 2024-06-01 MED ORDER — PROCHLORPERAZINE MALEATE 10 MG PO TABS
10.0000 mg | ORAL_TABLET | Freq: Four times a day (QID) | ORAL | 6 refills | Status: AC | PRN
Start: 2024-06-01 — End: ?

## 2024-06-01 MED ORDER — ONDANSETRON HCL 8 MG PO TABS
8.0000 mg | ORAL_TABLET | Freq: Three times a day (TID) | ORAL | 1 refills | Status: AC | PRN
Start: 2024-06-01 — End: ?

## 2024-06-01 MED ORDER — LIDOCAINE-PRILOCAINE 2.5-2.5 % EX CREA: TOPICAL_CREAM | CUTANEOUS | 3 refills | Status: AC

## 2024-06-01 MED ORDER — ACYCLOVIR 400 MG PO TABS: 400.0000 mg | ORAL_TABLET | Freq: Every day | ORAL | 3 refills | Status: DC

## 2024-06-01 NOTE — Progress Notes (Signed)
START OFF PATHWAY REGIMEN - Lymphoma and CLL   [id number]:R-miniCHOP (Rituximab IV + Cyclophosphamide IV + Doxorubicin IV + Vincristine IV + Prednisone PO) q21 Days x 6 Cycles:   A cycle is every 21 days:     Prednisone      Rituximab-xxxx      Cyclophosphamide      Doxorubicin      Vincristine   **Always confirm dose/schedule in your pharmacy ordering system**  Patient Characteristics: Diffuse Large B-Cell Lymphoma or Follicular Lymphoma, Grade 3B, First Line, Stage III and IV Disease Type: Not Applicable Disease Type: Diffuse Large B-Cell Lymphoma Disease Type: Not Applicable Line of therapy: First Line Intent of Therapy: Curative Intent, Discussed with Patient

## 2024-06-01 NOTE — H&P (Signed)
 Chief Complaint: Patient was seen in consultation today for large B-cell lymphoma of the testicle   Procedure: Port-a-catheter insertion  Referring Physician(s): Yu,Zhou  Supervising Physician: Dr. Zettie Hillock  Patient Status: [facility] - Out-pt  History of Present Illness: [name] is a 83 y.o. male with a history of scrotal swelling following a right hydrocele repair in 2022. He was seen by Urology in 03/2024 and reported worsening of swelling over the past 6 months. Subsequent scrotal US  significant for diffusely heterogenous right testicular echotexture consistent with a dominant mass and multiple left testicular masses favoring a lymphoproliferative process. Patient underwent radical right orchiectomy on 5/23 with Dr. 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 He underwent a PET scan on 6/10 for further staging is set to start chemotherapy. He has been referred to IR for port-a-catheter placement.   Patient is currently resting in bed with his wife at the bedside. Admits to some fatigue, but is otherwise doing well. NPO since midnight. VSS. All questions and concerns answered at the bedside.   Code Status: Full Code  Past Medical History:  Diagnosis Date   Arthritis    Bilateral pleural effusion    Bone spur 2010   right shoulder   CAD (coronary artery disease)    a. 12/2019 NSTEMI/Cath: LM 99ost/m, LAD 40, LCX nl, OM1 80, RCA 70ost/p, 99p, 30d, EF 45-50%; b. 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   Complication of anesthesia    a.) delayed emergence; b.) emergence delirium   Diverticulosis    GERD (gastroesophageal reflux disease)    H/O atrial flutter    HFrEF (heart failure with reduced ejection fraction) (HCC)    a.) TEE 01/08/2020: EF 45-50%; b.) TTE 04/14/2022: EF 50-55%, septal HK, G2DD, GLS /18.3%, mild-mod MR, AoV sclerosis without stenosis.   Hyperlipidemia    Hypertension    Hyponatremia    IBS (irritable bowel  syndrome)    Ischemic cardiomyopathy    a. 12/2019 TEE: EF 45-50%. Nl RV fxn. Mild MR.   NSTEMI (non-ST elevated myocardial infarction) (HCC) 01/07/2020   a.) LHC 01/08/2020: EF 45-50%, LVEDP 22 mmHg; 99% o-mLM, 80% OM1, 40% mLAD, 70% o-pRCA, 99% pRCA, 30% dRCA --> consult CVTS; b.) 4v CABG 01/07/2022: LIMA-LAD, SVG-RPDA, SVG-OM1, SVG-OM2.   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   Skin cancer    a.) s/p Mohs surgery   Statin intolerance    Testicular mass     Past Surgical History:  Procedure Laterality Date   BACK SURGERY     Lumbar   BUNIONECTOMY     CATARACT EXTRACTION, BILATERAL     COLONOSCOPY     CORONARY ARTERY BYPASS GRAFT N/A 01/08/2020   Procedure: CORONARY ARTERY BYPASS GRAFTING (CABG) x 4  WITH ENDOSCOPIC HARVESTING OF BILATERAL GREATER SAPHENOUS VEINS;  Surgeon: Heriberto London, MD;  Location: [facility] OR;  Service: Open Heart Surgery;  Laterality: N/A;   HEMORRHOID SURGERY  2004   HYDROCELE EXCISION     INGUINAL HERNIA REPAIR  01/2010   LEFT HEART CATH AND CORONARY ANGIOGRAPHY N/A 01/08/2020   Procedure: LEFT HEART CATH AND CORONARY ANGIOGRAPHY;  Surgeon: Wenona Hamilton, MD;  Location: [facility] INVASIVE CV LAB;  Service: Cardiovascular;  Laterality: N/A;   ORCHIECTOMY Right  05/12/2024   Procedure: ORCHIECTOMY;  Surgeon: Lawerence Pressman, MD;  Location: [facility] ORS;  Service: Urology;  Laterality: Right;   ROTATOR CUFF REPAIR Left 06/2010   left/ bone spur Dr.Blackman   SHOULDER ARTHROSCOPY WITH OPEN ROTATOR CUFF REPAIR Right 05/26/2018   Procedure: SHOULDER ARTHROSCOPY WITH OPEN ROTATOR CUFF REPAIR;  Surgeon: Elner Hahn, MD;  Location: [facility] ORS;  Service: Orthopedics;  Laterality: Right;  debridement, decompression   SKIN CANCER EXCISION  2003-2005   Mohs-right shoulder    TEE WITHOUT CARDIOVERSION N/A 01/08/2020   Procedure: TRANSESOPHAGEAL ECHOCARDIOGRAM (TEE);  Surgeon: Matt Song, Donata Fryer, MD;  Location: [facility] OR;  Service: Open Heart Surgery;  Laterality: N/A;   TOTAL HIP ARTHROPLASTY Right 07/14/2022   Procedure: TOTAL HIP ARTHROPLASTY ANTERIOR APPROACH;  Surgeon: Molli Angelucci, MD;  Location: [facility] ORS;  Service: Orthopedics;  Laterality: Right;    Allergies: Crestor [rosuvastatin], Penicillins, Peanut-containing drug products, Atorvastatin, and Simvastatin  Medications: Prior to Admission medications   Medication Sig Start Date End Date Taking? Authorizing Provider  acetaminophen  (TYLENOL ) 500 MG tablet Take 500-1,000 mg by mouth every 6 (six) hours as needed for moderate pain (pain score 4-6).    [provider]  amLODipine  (NORVASC ) 5 MG tablet Take 1 tablet (5 mg total) by mouth daily. Patient taking differently: Take 5 mg by mouth every morning. 09/10/23 05/25/24  Constancia Delton, MD  docusate sodium  (COLACE) 100 MG capsule Take 1 capsule (100 mg total) by mouth 2 (two) times daily. Patient taking differently: Take 100 mg by mouth every evening. 07/15/22   Coralyn Derry, PA-C  Evolocumab  (REPATHA  SURECLICK) 140 MG/ML SOAJ Inject 140 mg into the skin every 14 (fourteen) days. 09/01/23   Ronald Cockayne, NP  metoprolol  succinate (TOPROL -XL) 25 MG 24 hr tablet Take 0.5 tablets (12.5 mg total) by mouth daily. Patient taking differently: Take 12.5 mg by mouth every morning. 09/01/23   Ronald Cockayne, NP  nitroGLYCERIN  (NITROSTAT ) 0.4 MG SL tablet Place 0.4 mg under the tongue every 5 (five) minutes as needed for chest pain.    [provider]  omega-3 acid ethyl esters (LOVAZA ) 1 g capsule Take 1 g by mouth every other day.    [provider]  tadalafil  (CIALIS ) 10 MG tablet Take 1-2 tablets (10-20 mg total) by mouth daily as needed for erectile dysfunction (take 1 hour prior to sexual activity). 04/13/24   Lawerence Pressman, MD   traMADol  (ULTRAM ) 50 MG tablet Take 1 tablet (50 mg total) by mouth every 6 (six) hours. 07/15/22   Coralyn Derry, PA-C     Family History  Problem Relation Age of Onset   Heart disease Mother    Alzheimer's disease Mother    Heart attack Father    Heart disease Brother    Stroke Brother    Cancer Neg Hx    Diabetes Neg Hx    Colon cancer Neg Hx    Esophageal cancer Neg Hx    Stomach cancer Neg Hx    Pancreatic cancer Neg Hx     Social History   Socioeconomic History   Marital status: Married    Spouse name: Not on file   Number of children: 2   Years of education: Not on file   Highest education level: Not on file  Occupational History   Occupation: retired Academic librarian: retired  Tobacco Use   Smoking status: Never   Smokeless tobacco: Never  Advertising account planner  Vaping status: Never Used  Substance and Sexual Activity   Alcohol use: Not Currently    Alcohol/week: 1.0 standard drink of alcohol    Types: 1 Glasses of wine per week    Comment: very rare   Drug use: No   Sexual activity: Not on file  Other Topics Concern   Not on file  Social History Narrative   2 sons, no contact   Splits his time between [place]  and [place]    Has live-in since 2005   Has living will   Requests Bernice--girlfriend or brother Porfirio Bristol, as health care POA   Would accept resuscitation but no prolonged artificial ventilation   Wouldn't want tube feeds if cognitively unaware   Social Drivers of Health   Financial Resource Strain: Low Risk  (04/05/2024)   Received from [facility]   Overall Financial Resource Strain (CARDIA)    Difficulty of Paying Living Expenses: Not hard at all  Food Insecurity: No Food Insecurity (04/05/2024)   Received from [facility]   Hunger Vital Sign    Within the past 12 months, you worried that your food would run out before you got the money to buy more.: Never true    Within the past  12 months, the food you bought just didn't last and you didn't have money to get more.: Never true  Transportation Needs: No Transportation Needs (04/05/2024)   Received from [facility] - Transportation    In the past 12 months, has lack of transportation kept you from medical appointments or from getting medications?: No    Lack of Transportation (Non-Medical): No  Physical Activity: Not on file  Stress: Not on file  Social Connections: Not on file    Review of Systems  Constitutional:  Positive for fatigue.  Denies any N/V, chest pain, shortness of breath, fevers/chills. All other ROS negative.  Vital Signs: BP (!) 157/63   Pulse (!) 57   Temp (!) 97.5 F (36.4 C) (Oral)   Resp 13   SpO2 100%   Physical Exam Vitals reviewed.  Constitutional:      Appearance: Normal appearance.  HENT:     Head: Normocephalic and atraumatic.     Mouth/Throat:     Mouth: Mucous membranes are moist.     Pharynx: Oropharynx is clear.   Cardiovascular:     Rate and Rhythm: Normal rate and regular rhythm.  Pulmonary:     Effort: Pulmonary effort is normal.     Breath sounds: Normal breath sounds.  Abdominal:     General: Abdomen is flat.     Palpations: Abdomen is soft.     Tenderness: There is no abdominal tenderness.   Musculoskeletal:        General: Normal range of motion.     Cervical back: Normal range of motion.   Skin:    General: Skin is warm and dry.   Neurological:     General: No focal deficit present.     Mental Status: He is alert and oriented to person, place, and time. Mental status is at baseline.   Psychiatric:        Mood and Affect: Mood normal.        Behavior: Behavior normal.        Judgment: Judgment normal.     Imaging: NM PET Image Initial (PI) Skull Base To Thigh Result Date: 05/31/2024 CLINICAL DATA:  Initial treatment strategy for testicular lymphoma. EXAM:  NUCLEAR MEDICINE PET SKULL BASE TO THIGH TECHNIQUE: 7.23 mCi  F-18 FDG was injected intravenously. Full-ring PET imaging was performed from the skull base to thigh after the radiotracer. CT data was obtained and used for attenuation correction and anatomic localization. Fasting blood glucose: 97 mg/dl COMPARISON:  CT chest abdomen pelvis 04/28/2024. Scrotal ultrasound 04/25/2024 FINDINGS: Mediastinal blood pool activity: SUV max 2.0 Liver activity: SUV max 2.5 NECK: No specific abnormal uptake seen above blood pool in the neck including along lymph node change of the submandibular, posterior triangle or internal jugular region. Near symmetric uptake of the visualized intracranial compartment. Incidental CT findings: The parotid glands, submandibular glands are unremarkable. Thyroid  glands preserved. Scattered vascular calcifications. There is some streak artifact related to the patient's dental hardware. Visualized paranasal sinuses and mastoid air cells are clear. CHEST: On the prior chest CT scan there is some small bilateral hilar lymph nodes. Some the show uptake. Example left hilar lesion has maximum SUV value of 4.9 and correspond to a node on the prior contrast CT scan measuring 8 mm in short axis. This is seen today on image 61. Small right-sided hilar nodes also seen such as image 64 with uptake of 3.5 maximum SUV. No abnormal uptake otherwise along nodal chains in the axillary regions. Previous described prominent paratracheal nodes have low-level uptake. Example the node described right paratracheal the common near the carina and is again seen image 57 today and similar in size but this particular node does not have significant uptake with maximum SUV of 2.3. Small focus of uptake in the right lung maximum SUV value of 1.6 corresponding to a small nodular area in the superior segment of the right lower lobe on image 57. This is branching MeV bronchial in origin. Otherwise no significant abnormal uptake along the lung parenchyma. Incidental CT findings: Breathing  motion. No pleural effusion or consolidation. Status post median sternotomy. Heart is nonenlarged. No pericardial effusion. Small hiatal hernia. The thoracic aorta is normal course and caliber. ABDOMEN/PELVIS: There is physiologic distribution radiotracer along the parenchymal organs, bowel and renal collecting systems. Only minimal nonspecific bilateral adrenal uptake. Specifically the spleen is nonenlarged. No abnormal splenic activity. There are nodular areas uptake along the scrotum towards the testicles consistent with known history. The subtle confluent area on the right has maximum SUV of 12.4. The left side there are several adjacent clustered nodular areas of uptake maximum SUV value dislocation of 9.2. Is presence of scrotal hydrocele as well as nodular tissue on CT. Edema is seen tracking along the inguinal canal region on the right greater than left. Surgical changes along the anterior right side of the perineum with mesh. Please correlate for prior hernia repair or other process. No specific abnormal uptake along lymph node chains of the abdomen and pelvis. Exception is some uptake within a small right femoral node maximum SUV 3.0 and node on image 136 just medial to the common femoral vein measures 11 mm in short axis. This areas partially obscured by the streak artifact from the patient's right hip arthroplasty. The there is some mild uptake within right inguinal node maximum SUV of only 1.8. Incidental CT findings: Enlarged prostate with mass effect along the bladder. Wall thickening of the bladder. Diffuse vascular calcifications identified including aorta and branch vessels. Atrophic pancreas. Gallbladder is present. Small cystic foci along the liver are incompletely evaluated on this examination but not hypermetabolic. Please correlate prior contrast CT. No renal or ureteral stones. Bowel is nondilated. Scattered colonic stool.  No stomach dilatation. Small bowel is nondilated. No free air or free  fluid. SKELETON: No abnormal uptake identified along the visualized osseous structures. 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 Incidental CT findings: Diffuse degenerative changes identified. Mild wedge deformity of L1-L2. IMPRESSION: Lobular appearance to the scrotum with soft tissue as seen on the prior CT scan as well as edema. Scrotal nodularity is hypermetabolic consistent with known neoplasm. Bilateral small hilar lymph nodes are mildly hypermetabolic. These are indeterminate. Area of neoplastic spread is technically in the differential but there the other options as well based on the distribution. Short-term follow up CT Torrence be of some benefit. Previously seen prominent mediastinal nodes are without abnormal uptake. Small right femoral and inguinal nodes with minimal uptake. Low-level uptake along the branching nodular focus in the superior segment right lower lobe seen on prior CT. Again this could be mucous plugging or infectious or inflammatory. Recommend attention on follow-up CT scan. Small cystic foci in the liver are not hypermetabolic. Please correlate with prior CT recommendations. Electronically Signed   By: Adrianna Horde M.D.   On: 05/31/2024 14:07   US  Soft Tissue Head/Neck Result Date: 05/21/2024 CLINICAL DATA:  83 year old male with neck swelling and pain. EXAM: ULTRASOUND OF HEAD/NECK SOFT TISSUES TECHNIQUE: Ultrasound examination of the head and neck soft tissues was performed in the area of clinical concern. COMPARISON:  None Available. FINDINGS: No sonographic abnormality in the visualized neck. No cervical lymphadenopathy. IMPRESSION: No sonographic abnormality in the visualized neck. Electronically Signed   By: Creasie Doctor M.D.   On: 05/21/2024 08:08    Labs:  CBC: Recent Labs    09/01/23 1007 05/04/24 1613  WBC 5.7 8.4  HGB 13.8 13.8  HCT 42.1 40.6  PLT 210 278    COAGS: No results for input(s): INR, APTT in the last 8760  hours.  BMP: Recent Labs    09/01/23 1007 05/04/24 1613  NA 133* 132*  K 4.1 4.0  CL 96 95*  CO2 23 25  GLUCOSE 95 107*  BUN 7* 13  CALCIUM  9.3 9.3  CREATININE 0.90 0.90  GFRNONAA  --  >60    LIVER FUNCTION TESTS: Recent Labs    05/04/24 1613  BILITOT 1.4*  AST 28  ALT 21  ALKPHOS 178*  PROT 8.5*  ALBUMIN  4.9    TUMOR MARKERS: No results for input(s): AFPTM, CEA, CA199, CHROMGRNA in the last 8760 hours.  Assessment and Plan:  Large B-cell Lymphoma of the testicle: Ahman Dugdale Gubser is a 83 y.o. male with a history of right hydrocele s/p hydrocelectomy in 2022 and recent diagnosis of testicular lymphoma who presents to [facility] Interventional Radiology department for an image-guided port-a-catheter insertion with Dr. Zettie Hillock on 06/02/24. Procedure to be performed under moderate sedation.  Risks and benefits of image guided port-a-catheter placement was discussed with the patient including, but not limited to bleeding, infection, pneumothorax, or fibrin sheath development and need for additional procedures.  All of the patient's questions were answered, patient is agreeable to proceed. Consent signed and in chart.   Thank you for this interesting consult. I greatly enjoyed meeting Aarush Stukey Hemminger and look forward to participating in their care. A copy of this report was sent to the requesting provider on this date.  Electronically Signed: Orson Blalock, PA-C 06/02/2024, 9:59 AM   I spent a total of 15 Minutes in face to face clinical consultation, greater than 50% of which was counseling/coordinating care for

## 2024-06-01 NOTE — Progress Notes (Signed)
 Patient for IR Port Insertion on Friday 06/02/24, I called and spoke with the patient on the phone and gave pre-procedure instructions. Pt was made aware to be here at 9a, NPO after MN prior to procedure as well as driver post procedure/recovery/discharge. Pt stated understanding.  Called   06/01/24

## 2024-06-02 ENCOUNTER — Other Ambulatory Visit: Payer: Self-pay

## 2024-06-02 ENCOUNTER — Encounter: Payer: Self-pay | Admitting: Radiology

## 2024-06-02 ENCOUNTER — Encounter: Payer: Self-pay | Admitting: Oncology

## 2024-06-02 ENCOUNTER — Ambulatory Visit
Admission: RE | Admit: 2024-06-02 | Discharge: 2024-06-02 | Disposition: A | Discharge status: Home / Self Care | Source: Ambulatory Visit | Attending: Oncology | Admitting: Oncology

## 2024-06-02 DIAGNOSIS — C83398 Diffuse large b-cell lymphoma of other extranodal and solid organ sites: Secondary | ICD-10-CM | POA: Insufficient documentation

## 2024-06-02 DIAGNOSIS — N5089 Other specified disorders of the male genital organs: Secondary | ICD-10-CM

## 2024-06-02 HISTORY — PX: IR IMAGING GUIDED PORT INSERTION: IMG5740

## 2024-06-02 MED ORDER — FENTANYL CITRATE (PF) 100 MCG/2ML IJ SOLN: INTRAMUSCULAR | Status: AC | PRN

## 2024-06-02 MED ORDER — LIDOCAINE HCL 1 % IJ SOLN
INTRAMUSCULAR | Status: AC
  Filled 2024-06-02: qty 20

## 2024-06-02 MED ORDER — HEPARIN SOD (PORK) LOCK FLUSH 100 UNIT/ML IV SOLN: 500.0000 [IU] | Freq: Once | INTRAVENOUS | Status: AC

## 2024-06-02 MED ORDER — MIDAZOLAM HCL 2 MG/2ML IJ SOLN
INTRAMUSCULAR | Status: AC
  Filled 2024-06-02: qty 2

## 2024-06-02 MED ORDER — SODIUM CHLORIDE 0.9 % IV SOLN: INTRAVENOUS | Status: DC

## 2024-06-02 MED ORDER — FENTANYL CITRATE (PF) 100 MCG/2ML IJ SOLN
INTRAMUSCULAR | Status: AC
Start: 2024-06-02 — End: 2024-06-02
  Filled 2024-06-02: qty 2

## 2024-06-02 MED ORDER — HEPARIN SOD (PORK) LOCK FLUSH 100 UNIT/ML IV SOLN
INTRAVENOUS | Status: AC
Start: 2024-06-02 — End: 2024-06-02
  Filled 2024-06-02: qty 5

## 2024-06-02 MED ORDER — LIDOCAINE HCL 1 % IJ SOLN
10.0000 mL | Freq: Once | INTRAMUSCULAR | Status: AC
  Administered 2024-06-02: 10 mL via INTRADERMAL

## 2024-06-02 MED ORDER — MIDAZOLAM HCL 5 MG/5ML IJ SOLN: INTRAMUSCULAR | Status: AC | PRN

## 2024-06-02 NOTE — Procedures (Signed)
 Interventional Radiology Procedure Note  Procedure: Port placement  Complications: None  Estimated Blood Loss: < 10 mL  Findings: RIJ SL chest port placed.  Rosetta Cons, MD

## 2024-06-02 NOTE — Progress Notes (Signed)
 Patient clinically stable post IR Port placement per DR Burna Carrier, tolerated well. Vitals stable pre and post procedure. Received Versed  1 mg along with Fentanyl  50 mcg IV for procedure. Report given to [facility] 352 RN post procedure/specials/19

## 2024-06-05 ENCOUNTER — Inpatient Hospital Stay

## 2024-06-06 ENCOUNTER — Other Ambulatory Visit: Payer: Self-pay

## 2024-06-06 DIAGNOSIS — C8580 Other specified types of non-Hodgkin lymphoma, unspecified site: Secondary | ICD-10-CM

## 2024-06-06 DIAGNOSIS — I25112 Atherosclerotic heart disease of native coronary artery with refractory angina pectoris: Secondary | ICD-10-CM

## 2024-06-07 NOTE — Addendum Note (Signed)
 Addended by: Hollace Lund on: 06/07/2024 01:53 PM   Modules accepted: Orders

## 2024-06-08 ENCOUNTER — Encounter
Admission: RE | Admit: 2024-06-08 | Discharge: 2024-06-08 | Disposition: A | Discharge status: Home / Self Care | Source: Ambulatory Visit | Attending: Oncology | Admitting: Oncology

## 2024-06-08 ENCOUNTER — Encounter: Payer: Self-pay | Admitting: Oncology

## 2024-06-08 ENCOUNTER — Inpatient Hospital Stay

## 2024-06-08 DIAGNOSIS — Z0189 Encounter for other specified special examinations: Secondary | ICD-10-CM | POA: Diagnosis present

## 2024-06-08 DIAGNOSIS — C8599 Non-Hodgkin lymphoma, unspecified, extranodal and solid organ sites: Secondary | ICD-10-CM | POA: Insufficient documentation

## 2024-06-08 DIAGNOSIS — Z79899 Other long term (current) drug therapy: Secondary | ICD-10-CM | POA: Diagnosis present

## 2024-06-08 DIAGNOSIS — C8580 Other specified types of non-Hodgkin lymphoma, unspecified site: Secondary | ICD-10-CM

## 2024-06-08 DIAGNOSIS — Z5111 Encounter for antineoplastic chemotherapy: Secondary | ICD-10-CM | POA: Diagnosis not present

## 2024-06-08 DIAGNOSIS — Z5181 Encounter for therapeutic drug level monitoring: Secondary | ICD-10-CM

## 2024-06-08 LAB — URIC ACID: Uric Acid, Serum: 3.8 mg/dL (ref 3.7–8.6)

## 2024-06-08 LAB — HEPATITIS PANEL, ACUTE
HCV Ab: NONREACTIVE
Hep A IgM: NONREACTIVE
Hep B C IgM: NONREACTIVE
Hepatitis B Surface Ag: NONREACTIVE

## 2024-06-08 MED ORDER — TECHNETIUM TC 99M-LABELED RED BLOOD CELLS IV KIT
25.0000 | PACK | Freq: Once | INTRAVENOUS | Status: AC | PRN
  Administered 2024-06-08: 22.89 via INTRAVENOUS

## 2024-06-11 ENCOUNTER — Other Ambulatory Visit: Payer: Self-pay | Admitting: Oncology

## 2024-06-11 DIAGNOSIS — C8580 Other specified types of non-Hodgkin lymphoma, unspecified site: Secondary | ICD-10-CM

## 2024-06-12 NOTE — Progress Notes (Signed)
 Pharmacist Chemotherapy Monitoring - Initial Assessment    Anticipated start date: 06/16/24   The following has been reviewed per standard work regarding the patient's treatment regimen: The patient's diagnosis, treatment plan and drug doses, and organ/hematologic function Lab orders and baseline tests specific to treatment regimen  The treatment plan start date, drug sequencing, and pre-medications Prior authorization status  Patient's documented medication list, including drug-drug interaction screen and prescriptions for anti-emetics and supportive care specific to the treatment regimen The drug concentrations, fluid compatibility, administration routes, and timing of the medications to be used The patient's access for treatment and lifetime cumulative dose history, if applicable  The patient's medication allergies and previous infusion related reactions, if applicable   Changes made to treatment plan:  N/A  Follow up needed:  Patient with dose-reduced R miniCHOP. Labs 04/2024, elevated T bili, ALK phos would meet criteria for reduction. Follow up repeat labs and if wanting further reduction from miniCHOP dosing or MD okay with already reduced miniCHOP  [name], PharmD, BCPS Clinical Pharmacist   06/12/2024  2:55 PM

## 2024-06-12 NOTE — Progress Notes (Signed)
 Rapid Infusion Rituximab Pharmacist Evaluation  Santo Zahradnik Kiene is a 83 y.o. 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   A pharmacist has verified the patient tolerated rituximab infusions per the [facility] standard infusion protocol without grade 3-4 infusion reactions. The treatment plan will be updated to reflect RIR if the patient qualifies per the checklist below:   Age > 42 years old Yes   Clinically significant cardiovascular disease Yes   Circulating lymphocyte count < 5000/uL prior to cycle two Yes  Lab Results  Component Value Date   LYMPHSABS 2.3 05/04/2024    Prior documented grade 3-4 infusion reaction to rituximab No   Prior documented grade 1-2 infusion reaction to rituximab (If YES, Pharmacist will confirm with Physician if patient is still a candidate for RIR) No   Previous rituximab infusion within the past 6 months No   Treatment Plan updated orders to reflect RIR No    Exander Shaul Steinmetz does not meet the criteria for Rapid Infusion Rituximab. 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 This patient is not going to be switched to rapid infusion rituximab.   Maudie FORBES Andreas, PharmD, BCPS Clinical Pharmacist   06/12/24 2:47 PM

## 2024-06-14 ENCOUNTER — Encounter: Payer: Self-pay | Admitting: Oncology

## 2024-06-16 ENCOUNTER — Encounter: Payer: Self-pay | Admitting: Oncology

## 2024-06-16 ENCOUNTER — Inpatient Hospital Stay

## 2024-06-16 ENCOUNTER — Inpatient Hospital Stay (HOSPITAL_BASED_OUTPATIENT_CLINIC_OR_DEPARTMENT_OTHER): Admitting: Oncology

## 2024-06-16 VITALS — BP 138/53 | HR 55 | Temp 97.2°F | Resp 16 | Wt 130.0 lb

## 2024-06-16 VITALS — BP 113/46 | HR 61 | Temp 97.1°F | Resp 16

## 2024-06-16 DIAGNOSIS — C8580 Other specified types of non-Hodgkin lymphoma, unspecified site: Secondary | ICD-10-CM | POA: Diagnosis not present

## 2024-06-16 DIAGNOSIS — Z5111 Encounter for antineoplastic chemotherapy: Secondary | ICD-10-CM | POA: Diagnosis not present

## 2024-06-16 DIAGNOSIS — E871 Hypo-osmolality and hyponatremia: Secondary | ICD-10-CM

## 2024-06-16 DIAGNOSIS — R748 Abnormal levels of other serum enzymes: Secondary | ICD-10-CM

## 2024-06-16 LAB — CBC WITH DIFFERENTIAL (CANCER CENTER ONLY)
Abs Immature Granulocytes: 0.03 10*3/uL (ref 0.00–0.07)
Basophils Absolute: 0 10*3/uL (ref 0.0–0.1)
Basophils Relative: 1 %
Eosinophils Absolute: 0.3 10*3/uL (ref 0.0–0.5)
Eosinophils Relative: 4 %
HCT: 34.2 % — ABNORMAL LOW (ref 39.0–52.0)
Hemoglobin: 11.9 g/dL — ABNORMAL LOW (ref 13.0–17.0)
Immature Granulocytes: 1 %
Lymphocytes Relative: 25 %
Lymphs Abs: 1.6 10*3/uL (ref 0.7–4.0)
MCH: 30.2 pg (ref 26.0–34.0)
MCHC: 34.8 g/dL (ref 30.0–36.0)
MCV: 86.8 fL (ref 80.0–100.0)
Monocytes Absolute: 0.9 10*3/uL (ref 0.1–1.0)
Monocytes Relative: 13 %
Neutro Abs: 3.6 10*3/uL (ref 1.7–7.7)
Neutrophils Relative %: 56 %
Platelet Count: 230 10*3/uL (ref 150–400)
RBC: 3.94 MIL/uL — ABNORMAL LOW (ref 4.22–5.81)
RDW: 12.2 % (ref 11.5–15.5)
WBC Count: 6.4 10*3/uL (ref 4.0–10.5)
nRBC: 0 % (ref 0.0–0.2)

## 2024-06-16 LAB — CMP (CANCER CENTER ONLY)
ALT: 17 U/L (ref 0–44)
AST: 21 U/L (ref 15–41)
Albumin: 4.1 g/dL (ref 3.5–5.0)
Alkaline Phosphatase: 139 U/L — ABNORMAL HIGH (ref 38–126)
Anion gap: 8 (ref 5–15)
BUN: 11 mg/dL (ref 8–23)
CO2: 24 mmol/L (ref 22–32)
Calcium: 8.9 mg/dL (ref 8.9–10.3)
Chloride: 94 mmol/L — ABNORMAL LOW (ref 98–111)
Creatinine: 0.63 mg/dL (ref 0.61–1.24)
GFR, Estimated: >60 mL/min (ref >60)
Glucose, Bld: 113 mg/dL — ABNORMAL HIGH (ref 70–99)
Potassium: 4 mmol/L (ref 3.5–5.1)
Sodium: 126 mmol/L — ABNORMAL LOW (ref 135–145)
Total Bilirubin: 1.3 mg/dL — ABNORMAL HIGH (ref 0.0–1.2)
Total Protein: 7 g/dL (ref 6.5–8.1)

## 2024-06-16 MED ORDER — SODIUM CHLORIDE 0.9 % IV SOLN
400.0000 mg/m2 | Freq: Once | INTRAVENOUS | Status: AC
  Administered 2024-06-16: 660 mg via INTRAVENOUS
  Filled 2024-06-16: qty 33

## 2024-06-16 MED ORDER — SODIUM CHLORIDE 0.9 % IV SOLN
375.0000 mg/m2 | Freq: Once | INTRAVENOUS | Status: AC
  Administered 2024-06-16: 600 mg via INTRAVENOUS
  Filled 2024-06-16 (×2): qty 60
  Filled 2024-06-16: qty 10

## 2024-06-16 MED ORDER — DIPHENHYDRAMINE HCL 25 MG PO CAPS
50.0000 mg | ORAL_CAPSULE | Freq: Once | ORAL | Status: AC
  Administered 2024-06-16: 50 mg via ORAL
  Filled 2024-06-16: qty 2

## 2024-06-16 MED ORDER — VINCRISTINE SULFATE CHEMO INJECTION 1 MG/ML
1.0000 mg | Freq: Once | INTRAVENOUS | Status: AC
  Administered 2024-06-16: 1 mg via INTRAVENOUS
  Filled 2024-06-16: qty 1

## 2024-06-16 MED ORDER — ACETAMINOPHEN 325 MG PO TABS
650.0000 mg | ORAL_TABLET | Freq: Once | ORAL | Status: AC
  Administered 2024-06-16: 650 mg via ORAL
  Filled 2024-06-16: qty 2

## 2024-06-16 MED ORDER — SODIUM CHLORIDE 0.9 % IV SOLN
Freq: Once | INTRAVENOUS | Status: AC
  Filled 2024-06-16: qty 250

## 2024-06-16 MED ORDER — PALONOSETRON HCL INJECTION 0.25 MG/5ML
0.2500 mg | Freq: Once | INTRAVENOUS | Status: AC
  Administered 2024-06-16: 0.25 mg via INTRAVENOUS
  Filled 2024-06-16: qty 5

## 2024-06-16 MED ORDER — DOXORUBICIN HCL CHEMO IV INJECTION 2 MG/ML
25.0000 mg/m2 | Freq: Once | INTRAVENOUS | Status: AC
  Administered 2024-06-16: 42 mg via INTRAVENOUS
  Filled 2024-06-16: qty 21

## 2024-06-16 MED ORDER — APREPITANT 130 MG/18ML IV EMUL
130.0000 mg | Freq: Once | INTRAVENOUS | Status: AC
  Administered 2024-06-16: 130 mg via INTRAVENOUS
  Filled 2024-06-16: qty 18

## 2024-06-16 MED ORDER — PREDNISONE 50 MG PO TABS: 100.0000 mg | ORAL_TABLET | ORAL | 5 refills | Status: DC

## 2024-06-16 MED ORDER — SODIUM CHLORIDE 0.9 % IV SOLN
INTRAVENOUS | Status: DC
  Filled 2024-06-16: qty 250

## 2024-06-16 MED ORDER — DEXAMETHASONE SODIUM PHOSPHATE 10 MG/ML IJ SOLN
10.0000 mg | Freq: Once | INTRAMUSCULAR | Status: AC
  Administered 2024-06-16: 10 mg via INTRAVENOUS
  Filled 2024-06-16: qty 1

## 2024-06-16 MED ORDER — HEPARIN SOD (PORK) LOCK FLUSH 100 UNIT/ML IV SOLN
500.0000 [IU] | Freq: Once | INTRAVENOUS | Status: AC | PRN
  Administered 2024-06-16: 500 [IU]
  Filled 2024-06-16: qty 5

## 2024-06-16 NOTE — Patient Instructions (Signed)
 [facility] - A DEPT OF [facility]  Discharge Instructions: Thank you for choosing [facility] to provide your oncology and hematology care.  If you have a lab appointment with the Cancer Center, please go directly to the Cancer Center and check in at the registration area.  Wear comfortable clothing and clothing appropriate for easy access to any Portacath or PICC line.   We strive to give you quality time with your provider. You Hoose need to reschedule your appointment if you arrive late (15 or more minutes).  Arriving late affects you and other patients whose appointments are after yours.  Also, if you miss three or more appointments without notifying the office, you Gudgel be dismissed from the clinic at the provider's discretion.      For prescription refill requests, have your pharmacy contact our office and allow 72 hours for refills to be completed.    Today you received the following chemotherapy and/or immunotherapy agents doxorubicin, vincristine, cytoxan, rituxan       To help prevent nausea and vomiting after your treatment, we encourage you to take your nausea medication as directed.  BELOW ARE SYMPTOMS THAT SHOULD BE REPORTED IMMEDIATELY: *FEVER GREATER THAN 100.4 F (38 C) OR HIGHER *CHILLS OR SWEATING *NAUSEA AND VOMITING THAT IS NOT CONTROLLED WITH YOUR NAUSEA MEDICATION *UNUSUAL SHORTNESS OF BREATH *UNUSUAL BRUISING OR BLEEDING *URINARY PROBLEMS (pain or burning when urinating, or frequent urination) *BOWEL PROBLEMS (unusual diarrhea, constipation, pain near the anus) TENDERNESS IN MOUTH AND THROAT WITH OR WITHOUT PRESENCE OF ULCERS (sore throat, sores in mouth, or a toothache) UNUSUAL RASH, SWELLING OR PAIN  UNUSUAL VAGINAL DISCHARGE OR ITCHING   Items with * indicate a potential emergency and should be followed up as soon as possible or go to the Emergency Department if any problems should occur.  Please show the  CHEMOTHERAPY ALERT CARD or IMMUNOTHERAPY ALERT CARD at check-in to the Emergency Department and triage nurse.  Should you have questions after your visit or need to cancel or reschedule your appointment, please contact [facility] - A DEPT OF [facility]  [phone] and follow the prompts.  Office hours are 8:00 a.m. to 4:30 p.m. Monday - Friday. Please note that voicemails left after 4:00 p.m. Critz not be returned until the following business day.  We are closed weekends and major holidays. You have access to a nurse at all times for urgent questions. Please call the main number to the clinic [phone] and follow the prompts.  For any non-urgent questions, you Wilmeth also contact your provider using MyChart. We now offer e-Visits for anyone 60 and older to request care online for non-urgent symptoms. For details visit [email or web address].   Also download the MyChart app! Go to the app store, search MyChart, open the app, select [facility], and log in with your MyChart username and password.

## 2024-06-16 NOTE — Assessment & Plan Note (Signed)
 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

## 2024-06-16 NOTE — Progress Notes (Signed)
 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 PET scan results reviewed and discussed with patient. The diagnosis and care plan were discussed with patient in detail.   Recommend R Mini CHOP, intrathecal chemotherapy with methotrexate, radiation.  Prechemo MUGA showed LVEF 51%  Labs are reviewed and discussed with patient. Proceed with cycle 1 R - Mini CHOP.  D4 GCSF - recommend claritin 10mg  daily for 4 days. He gets Dexamethasone  on D1 and he takes prednisone 100mg  D2-5  Antiemetic instructions discussed with patient and partner.   Elevated alkaline phosphatase level Likely due to lymphoma.   Encounter for antineoplastic chemotherapy Chemotherapy as listed above.   Hyponatremia Encourage oral hydration.  Patient Arvidson liberate salt intake IVF NS 500ml x 1 today Check urine osmolarity, serum osmolarity, urine sodium.   Orders Placed This Encounter  Procedures   CBC with Differential (Cancer Center Only)    Standing Status:   Future    Expected Date:   06/22/2024    Expiration Date:   09/21/2024   CMP (Cancer Center only)    Standing Status:   Future    Expected Date:   06/22/2024    Expiration Date:   09/21/2024   Uric acid    Standing Status:   Future    Expected Date:   06/22/2024    Expiration Date:   09/21/2024   CBC with Differential (Cancer Center Only)    Standing Status:   Future    Expected Date:   07/10/2024    Expiration Date:   07/10/2025    CMP (Cancer Center only)    Standing Status:   Future    Expected Date:   07/10/2024    Expiration Date:   07/10/2025   Follow up 1 week All questions were answered. The patient knows to call the clinic with any problems, questions or concerns.  Call Babara, MD, PhD [facility] Hematology Oncology 06/16/2024    HISTORY OF PRESENTING ILLNESS:  Kell Ferris Tedesco 83 y.o. male presents to establish care for testicular lymphoma.   Oncology History  Lymphoma, large cell (HCC)  04/25/2024 Imaging   ultrasound scrotum with Doppler showed Diffusely heterogeneous right testicular echotexture, most consistent with dominant mass or masses. Multiple left testicular masses. Favor lymphoproliferative process such as lymphoma or leukemia. Bilateral primary testicular neoplasms felt less likely.   Right epididymal enlargement and heterogeneity, favoring epididymitis. The appearance of the testicles is felt unlikely to be be infectious/inflammatory.   04/28/2024 Imaging   CT chest with contrast abdomen pelvis with and without contrast showed 1. Heterogeneous enhancement in both testicles with substantial abnormal enlargement of the right testicle. This is suspicious for testicular neoplasm with lymphoblastic leukemia, lymphoma, or conceivably metastatic disease as top differential diagnostic considerations. Testicular adrenal rest tumors are a differential diagnostic consideration. 2. No overtly pathologic adenopathy identified. 3. Severe parenchymal atrophy of the pancreatic tail and body probably due to a 1.4 cm calcification/stone along the dorsal pancreatic duct at the junction of the body and head. Similar severe atrophy  was shown on the CT chest from 01/07/2020. 4. Branching density in the superior segment right lower lobe favoring bronchiectasis with airway plugging. 5. Small type 1 hiatal hernia. 6. Prominent stool throughout the colon favors constipation. 7. Chronic appearing anterior wedge  compressions at L1 and L2. 8. Lumbar spondylosis and degenerative disc disease at L5-S1 resulting in mild bilateral foraminal impingement. 9.  Aortic Atherosclerosis (ICD10-I70.0). 10. Nonspecific lesion peripherally in the right hepatic lobe. This could be further characterized with hepatic protocol MRI with and without contrast.   05/04/2024 Initial Diagnosis   Testicular lymphoma, large cell   He has experienced scrotal swelling since a hydrocele repair in 2022. The swelling decreased slightly post-surgery but never fully resolved. Recently, he received a report indicating concerns in both testicles, prompting further evaluation.    He reports neck soreness and swelling, initially attributed to physical activity such as painting and building a fence. The neck soreness is described as 'almost miserable.'   He mentions unintentional weight loss, noting a decrease from his usual weight of 135-140 pounds to 122 pounds over the past year. Despite efforts to regain weight, including going on cruises, he has not been successful. He currently weighs 133 pounds with clothes on.   No excessive night sweats. He feels cold most of the time but has experienced some fever. No family history of cancer but there is a family history of high cholesterol and heart problems. He has not had a PSA test but underwent a colonoscopy about three years ago.    05/24/2024 Cancer Staging   Staging form: Hodgkin and Non-Hodgkin Lymphoma, AJCC 8th Edition - Clinical stage from 05/24/2024: Stage III (Diffuse large B-cell lymphoma) - Signed by Babara Call, MD on 06/01/2024 Stage prefix: Initial diagnosis   06/16/2024 -  Chemotherapy   Patient is on Treatment Plan : NON-HODGKINS LYMPHOMA R-CHOP q21d      Today patient presents to discuss results. Patient reports chronic right shoulder pain and left neck pain. Appetite is fair.  Patient has had a Mediport placed no new concerns.     MEDICAL HISTORY:  Past Medical History:   Diagnosis Date   Arthritis    Bilateral pleural effusion    Bone spur 2010   right shoulder   CAD (coronary artery disease)    a. 12/2019 NSTEMI/Cath: LM 99ost/m, LAD 40, LCX nl, OM1 80, RCA 70ost/p, 99p, 30d, EF 45-50%; b. 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   Complication of anesthesia    a.) delayed emergence; b.) emergence delirium   Diverticulosis    GERD (gastroesophageal reflux disease)    H/O atrial flutter    HFrEF (heart failure with reduced ejection fraction) (HCC)    a.) TEE 01/08/2020: EF 45-50%; b.) TTE 04/14/2022: EF 50-55%, septal HK, G2DD, GLS /18.3%, mild-mod MR, AoV sclerosis without stenosis.   Hyperlipidemia    Hypertension    Hyponatremia    IBS (irritable bowel syndrome)    Ischemic cardiomyopathy    a. 12/2019 TEE: EF 45-50%. Nl RV fxn. Mild MR.   NSTEMI (non-ST elevated myocardial infarction) (HCC) 01/07/2020   a.) LHC 01/08/2020: EF 45-50%, LVEDP 22 mmHg; 99% o-mLM, 80% OM1, 40% mLAD, 70% o-pRCA, 99% pRCA, 30% dRCA --> consult CVTS; b.) 4v CABG 01/07/2022: LIMA-LAD, SVG-RPDA, SVG-OM1, SVG-OM2.   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   Skin cancer    a.) s/p Mohs surgery   Statin intolerance    Testicular mass     SURGICAL HISTORY: Past Surgical History:  Procedure Laterality Date   BACK SURGERY     Lumbar   BUNIONECTOMY     CATARACT EXTRACTION, BILATERAL     COLONOSCOPY     CORONARY ARTERY BYPASS GRAFT N/A 01/08/2020   Procedure: CORONARY ARTERY BYPASS GRAFTING (CABG) x 4  WITH ENDOSCOPIC HARVESTING OF BILATERAL GREATER SAPHENOUS VEINS;  Surgeon: Fleeta Hanford Coy, MD;  Location: [facility] OR;  Service: Open Heart Surgery;  Laterality: N/A;   HEMORRHOID SURGERY  2004   HYDROCELE EXCISION     INGUINAL HERNIA REPAIR  01/2010   IR  IMAGING GUIDED PORT INSERTION  06/02/2024   LEFT HEART CATH AND CORONARY ANGIOGRAPHY N/A 01/08/2020   Procedure: LEFT HEART CATH AND CORONARY ANGIOGRAPHY;  Surgeon: Darron Deatrice LABOR, MD;  Location: [facility] INVASIVE CV LAB;  Service: Cardiovascular;  Laterality: N/A;   ORCHIECTOMY Right 05/12/2024   Procedure: ORCHIECTOMY;  Surgeon: Francisca Redell BROCKS, MD;  Location: [facility] ORS;  Service: Urology;  Laterality: Right;   ROTATOR CUFF REPAIR Left 06/2010   left/ bone spur Dr.Blackman   SHOULDER ARTHROSCOPY WITH OPEN ROTATOR CUFF REPAIR Right 05/26/2018   Procedure: SHOULDER ARTHROSCOPY WITH OPEN ROTATOR CUFF REPAIR;  Surgeon: Edie Norleen PARAS, MD;  Location: [facility] ORS;  Service: Orthopedics;  Laterality: Right;  debridement, decompression   SKIN CANCER EXCISION  2003-2005   Mohs-right shoulder   TEE WITHOUT CARDIOVERSION N/A 01/08/2020   Procedure: TRANSESOPHAGEAL ECHOCARDIOGRAM (TEE);  Surgeon: Fleeta Hanford, Coy, MD;  Location: [facility];  Service: Open Heart Surgery;  Laterality: N/A;   TOTAL HIP ARTHROPLASTY Right 07/14/2022   Procedure: TOTAL HIP ARTHROPLASTY ANTERIOR APPROACH;  Surgeon: Kathlynn Sharper, MD;  Location: [facility] ORS;  Service: Orthopedics;  Laterality: Right;    SOCIAL HISTORY: Social History   Socioeconomic History   Marital status: Married    Spouse name: Not on file   Number of children: 2   Years of education: Not on file   Highest education level: Not on file  Occupational History   Occupation: retired Academic librarian: retired  Tobacco Use   Smoking status: Never   Smokeless tobacco: Never  Vaping Use   Vaping status: Never Used  Substance and Sexual Activity   Alcohol use: Not Currently    Alcohol/week: 1.0 standard drink of alcohol    Types: 1 Glasses of wine per week    Comment: very rare   Drug use: No   Sexual activity: Not on file  Other Topics Concern   Not on file  Social History Narrative   2 sons, no contact   Splits his time between  [place]  and [place]    Has live-in since 2005   Has living will   Requests Bernice--girlfriend or brother Lamar, as health care POA   Would accept resuscitation but no prolonged artificial ventilation   Wouldn't want tube feeds if cognitively unaware   Social Drivers of Health   Financial Resource Strain: Low Risk  (04/05/2024)   Received from [facility]   Overall Financial Resource Strain (CARDIA)    Difficulty of Paying Living Expenses: Not hard at all  Food Insecurity: No Food Insecurity (04/05/2024)   Received from [facility]   Hunger Vital Sign    Within the past  12 months, you worried that your food would run out before you got the money to buy more.: Never true    Within the past 12 months, the food you bought just didn't last and you didn't have money to get more.: Never true  Transportation Needs: No Transportation Needs (04/05/2024)   Received from [facility] - Transportation    In the past 12 months, has lack of transportation kept you from medical appointments or from getting medications?: No    Lack of Transportation (Non-Medical): No  Physical Activity: Not on file  Stress: Not on file  Social Connections: Not on file  Intimate Partner Violence: Not on file    FAMILY HISTORY: Family History  Problem Relation Age of Onset   Heart disease Mother    Alzheimer's disease Mother    Heart attack Father    Heart disease Brother    Stroke Brother    Cancer Neg Hx    Diabetes Neg Hx    Colon cancer Neg Hx    Esophageal cancer Neg Hx    Stomach cancer Neg Hx    Pancreatic cancer Neg Hx     ALLERGIES:  is allergic to crestor [rosuvastatin], penicillins, peanut-containing drug products, atorvastatin, and simvastatin.  MEDICATIONS:  Current Outpatient Medications  Medication Sig Dispense Refill   acetaminophen  (TYLENOL ) 500 MG tablet Take 500-1,000 mg by mouth every 6 (six) hours as needed for  moderate pain (pain score 4-6).     acyclovir  (ZOVIRAX ) 400 MG tablet Take 1 tablet (400 mg total) by mouth daily. 30 tablet 3   allopurinol  (ZYLOPRIM ) 300 MG tablet Take 1 tablet (300 mg total) by mouth daily. 30 tablet 3   amLODipine  (NORVASC ) 5 MG tablet Take 1 tablet (5 mg total) by mouth daily. (Patient taking differently: Take 5 mg by mouth every morning.) 180 tablet 3   docusate sodium  (COLACE) 100 MG capsule Take 1 capsule (100 mg total) by mouth 2 (two) times daily. (Patient taking differently: Take 100 mg by mouth every evening.) 10 capsule 0   Evolocumab  (REPATHA  SURECLICK) 140 MG/ML SOAJ Inject 140 mg into the skin every 14 (fourteen) days. 2 mL 11   lidocaine -prilocaine  (EMLA ) cream Apply to affected area once 30 g 3   metoprolol  succinate (TOPROL -XL) 25 MG 24 hr tablet Take 0.5 tablets (12.5 mg total) by mouth daily. (Patient taking differently: Take 12.5 mg by mouth every morning.) 45 tablet 3   nitroGLYCERIN  (NITROSTAT ) 0.4 MG SL tablet Place 0.4 mg under the tongue every 5 (five) minutes as needed for chest pain.     ondansetron  (ZOFRAN ) 8 MG tablet Take 1 tablet (8 mg total) by mouth every 8 (eight) hours as needed for nausea or vomiting. Start on the third day after cyclophosphamide chemotherapy. 30 tablet 1   predniSONE (DELTASONE) 50 MG tablet Take 2 tablets (100 mg total) by mouth See admin instructions. Take 100mg  daily on Day 2-5 of chemotherapy. 8 tablet 5   prochlorperazine  (COMPAZINE ) 10 MG tablet Take 1 tablet (10 mg total) by mouth every 6 (six) hours as needed for nausea or vomiting. 30 tablet 6   tadalafil  (CIALIS ) 10 MG tablet Take 1-2 tablets (10-20 mg total) by mouth daily as needed for erectile dysfunction (take 1 hour prior to sexual activity). 30 tablet 11   traMADol  (ULTRAM ) 50 MG tablet Take 1 tablet (50 mg total) by mouth every 6 (six) hours. 30 tablet 0   omega-3 acid ethyl esters (LOVAZA )  1 g capsule Take 1 g by mouth every other day.     No current  facility-administered medications for this visit.   Facility-Administered Medications Ordered in Other Visits  Medication Dose Route Frequency Provider Last Rate Last Admin   0.9 %  sodium chloride  infusion   Intravenous Continuous Babara Call, MD       heparin  lock flush 100 unit/mL  500 Units Intracatheter Once PRN Babara Call, MD        Review of Systems  Constitutional:  Positive for appetite change, fatigue and unexpected weight change. Negative for chills and fever.  HENT:   Negative for hearing loss and voice change.   Eyes:  Negative for eye problems and icterus.  Respiratory:  Negative for chest tightness, cough and shortness of breath.   Cardiovascular:  Negative for chest pain and leg swelling.  Gastrointestinal:  Negative for abdominal distention and abdominal pain.  Endocrine: Negative for hot flashes.  Genitourinary:  Negative for difficulty urinating, dysuria and frequency.        Status post right orchiectomy  Musculoskeletal:  Positive for arthralgias and neck pain.  Skin:  Negative for itching and rash.  Neurological:  Negative for light-headedness and numbness.  Hematological:  Negative for adenopathy. Does not bruise/bleed easily.  Psychiatric/Behavioral:  Negative for confusion.      PHYSICAL EXAMINATION: ECOG PERFORMANCE STATUS: 1 - Symptomatic but completely ambulatory  Vitals:   06/16/24 0846  BP: (!) 138/53  Pulse: (!) 55  Resp: 16  Temp: (!) 97.2 F (36.2 C)  SpO2: 100%   Filed Weights   06/16/24 0846  Weight: 130 lb (59 kg)    Physical Exam Constitutional:      General: He is not in acute distress.    Appearance: He is not diaphoretic.  HENT:     Head: Normocephalic and atraumatic.   Eyes:     General: No scleral icterus.   Cardiovascular:     Rate and Rhythm: Normal rate and regular rhythm.     Heart sounds: No murmur heard. Pulmonary:     Effort: Pulmonary effort is normal. No respiratory distress.     Breath sounds: No wheezing.   Abdominal:     General: There is no distension.     Palpations: Abdomen is soft.     Tenderness: There is no abdominal tenderness.   Musculoskeletal:        General: Normal range of motion.     Cervical back: Normal range of motion and neck supple.   Skin:    General: Skin is warm and dry.     Findings: No erythema.   Neurological:     Mental Status: He is alert and oriented to person, place, and time. Mental status is at baseline.     Motor: No abnormal muscle tone.   Psychiatric:        Mood and Affect: Affect normal.     LABORATORY DATA:  I have reviewed the data as listed    Latest Ref Rng & Units 06/16/2024    8:34 AM 05/04/2024    4:13 PM 09/01/2023   10:07 AM  CBC  WBC 4.0 - 10.5 K/uL 6.4  8.4  5.7   Hemoglobin 13.0 - 17.0 g/dL 88.0  86.1  86.1   Hematocrit 39.0 - 52.0 % 34.2  40.6  42.1   Platelets 150 - 400 K/uL 230  278  210       Latest Ref Rng & Units 06/16/2024  8:34 AM 05/04/2024    4:13 PM 09/01/2023   10:07 AM  CMP  Glucose 70 - 99 mg/dL 886  892  95   BUN 8 - 23 mg/dL 11  13  7    Creatinine 0.61 - 1.24 mg/dL 9.36  9.09  9.09   Sodium 135 - 145 mmol/L 126  132  133   Potassium 3.5 - 5.1 mmol/L 4.0  4.0  4.1   Chloride 98 - 111 mmol/L 94  95  96   CO2 22 - 32 mmol/L 24  25  23    Calcium  8.9 - 10.3 mg/dL 8.9  9.3  9.3   Total Protein 6.5 - 8.1 g/dL 7.0  8.5    Total Bilirubin 0.0 - 1.2 mg/dL 1.3  1.4    Alkaline Phos 38 - 126 U/L 139  178    AST 15 - 41 U/L 21  28    ALT 0 - 44 U/L 17  21       RADIOGRAPHIC STUDIES: I have personally reviewed the radiological images as listed and agreed with the findings in the report. NM Cardiac Muga Rest Result Date: 06/08/2024 CLINICAL DATA:  Testicular lymphoma.  Chemotherapy EXAM: NUCLEAR MEDICINE CARDIAC BLOOD POOL IMAGING (MUGA) TECHNIQUE: Cardiac multi-gated acquisition was performed at rest following intravenous injection of Tc-72m labeled red blood cells. RADIOPHARMACEUTICALS:  22.9 mCi Tc-18m  pertechnetate in-vitro labeled red blood cells IV COMPARISON:  None Available. FINDINGS: No  focal wall motion abnormality of the left ventricle. Calculated left ventricular ejection fraction equals 51% IMPRESSION: Left ventricular ejection fraction equals51 %. Electronically Signed   By: Jackquline Boxer M.D.   On: 06/08/2024 15:29   IR IMAGING GUIDED PORT INSERTION Result Date: 06/02/2024 CLINICAL DATA:  B-cell lymphoma of the testicle. EXAM: Chest port catheter placement TECHNIQUE: Procedure performed using fluoroscopy and ultrasound CONTRAST:  None RADIOPHARMACEUTICALS:  None FLUOROSCOPY: 0.4 minutes 1 mGy COMPARISON:  None FINDINGS: The patient was placed in supine position on the IR gantry and the right upper chest and neck were prepped and draped in the usual sterile fashion. The nurse administered intravenous fentanyl  and Versed  under my supervision and the nurse had no other injuries other than monitoring the patient and administering medications. I was present for the entire duration of procedure. 1 mg intravenous Versed  and 50 mcg intravenous fentanyl  for a total sedation time of 18 minutes. Ultrasound guidance was used to investigate the right internal jugular vein which was anechoic and compressible indicating patency. The needle was then advanced from a scan negative through the soft tissue into the right internal jugular vein under ultrasound guidance. A final image was obtained and stored in the patient's permanent medical record. Access was then exchanged over a guidewire which was advanced under fluoroscopic guidance. The needle was removed and replaced with a micropuncture sheath. Approximately 2 inches below the clavicle the port pocket was created with a subsequent incision. The catheter was then tunneled from the port pocket to the venotomy site overlying the right internal jugular vein. Access was then exchanged over an 035 guidewire for peel-away sheath which was advanced over the  guidewire under fluoroscopic guidance. The catheter was then advanced through the peel-away sheath to the sinoatrial junction. Sheath was removed. The catheter was then cut at the port pocket and connected to chest port. The chest port was tested for function and finally function well. The chest port was then flushed with heparin  and a port pocket was closed with 4-0 suture. Final  image was obtained demonstrating satisfactory position of chest port. The final count of all materials was satisfactory. IMPRESSION: 1. Satisfactory placement of right internal jugular vein single-lumen chest port. The catheter tip is at the sinoatrial junction. 2.  Okay to use and power inject chest port. Electronically Signed   By: Cordella Banner   On: 06/02/2024 10:59   NM PET Image Initial (PI) Skull Base To Thigh Result Date: 05/31/2024 CLINICAL DATA:  Initial treatment strategy for testicular lymphoma. EXAM: NUCLEAR MEDICINE PET SKULL BASE TO THIGH TECHNIQUE: 7.23 mCi F-18 FDG was injected intravenously. Full-ring PET imaging was performed from the skull base to thigh after the radiotracer. CT data was obtained and used for attenuation correction and anatomic localization. Fasting blood glucose: 97 mg/dl COMPARISON:  CT chest abdomen pelvis 04/28/2024. Scrotal ultrasound 04/25/2024 FINDINGS: Mediastinal blood pool activity: SUV max 2.0 Liver activity: SUV max 2.5 NECK: No specific abnormal uptake seen above blood pool in the neck including along lymph node change of the submandibular, posterior triangle or internal jugular region. Near symmetric uptake of the visualized intracranial compartment. Incidental CT findings: The parotid glands, submandibular glands are unremarkable. Thyroid  glands preserved. Scattered vascular calcifications. There is some streak artifact related to the patient's dental hardware. Visualized paranasal sinuses and mastoid air cells are clear. CHEST: On the prior chest CT scan there is some small  bilateral hilar lymph nodes. Some the show uptake. Example left hilar lesion has maximum SUV value of 4.9 and correspond to a node on the prior contrast CT scan measuring 8 mm in short axis. This is seen today on image 61. Small right-sided hilar nodes also seen such as image 64 with uptake of 3.5 maximum SUV. No abnormal uptake otherwise along nodal chains in the axillary regions. Previous described prominent paratracheal nodes have low-level uptake. Example the node described right paratracheal the common near the carina and is again seen image 57 today and similar in size but this particular node does not have significant uptake with maximum SUV of 2.3. Small focus of uptake in the right lung maximum SUV value of 1.6 corresponding to a small nodular area in the superior segment of the right lower lobe on image 57. This is branching MeV bronchial in origin. Otherwise no significant abnormal uptake along the lung parenchyma. Incidental CT findings: Breathing motion. No pleural effusion or consolidation. Status post median sternotomy. Heart is nonenlarged. No pericardial effusion. Small hiatal hernia. The thoracic aorta is normal course and caliber. ABDOMEN/PELVIS: There is physiologic distribution radiotracer along the parenchymal organs, bowel and renal collecting systems. Only minimal nonspecific bilateral adrenal uptake. Specifically the spleen is nonenlarged. No abnormal splenic activity. There are nodular areas uptake along the scrotum towards the testicles consistent with known history. The subtle confluent area on the right has maximum SUV of 12.4. The left side there are several adjacent clustered nodular areas of uptake maximum SUV value dislocation of 9.2. Is presence of scrotal hydrocele as well as nodular tissue on CT. Edema is seen tracking along the inguinal canal region on the right greater than left. Surgical changes along the anterior right side of the perineum with mesh. Please correlate for prior  hernia repair or other process. No specific abnormal uptake along lymph node chains of the abdomen and pelvis. Exception is some uptake within a small right femoral node maximum SUV 3.0 and node on image 136 just medial to the common femoral vein measures 11 mm in short axis. This areas partially obscured by the  streak artifact from the patient's right hip arthroplasty. The there is some mild uptake within right inguinal node maximum SUV of only 1.8. Incidental CT findings: Enlarged prostate with mass effect along the bladder. Wall thickening of the bladder. Diffuse vascular calcifications identified including aorta and branch vessels. Atrophic pancreas. Gallbladder is present. Small cystic foci along the liver are incompletely evaluated on this examination but not hypermetabolic. Please correlate prior contrast CT. No renal or ureteral stones. Bowel is nondilated. Scattered colonic stool. No stomach dilatation. Small bowel is nondilated. No free air or free fluid. SKELETON: No abnormal uptake identified along the visualized osseous structures. 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 Incidental CT findings: Diffuse degenerative changes identified. Mild wedge deformity of L1-L2. IMPRESSION: Lobular appearance to the scrotum with soft tissue as seen on the prior CT scan as well as edema. Scrotal nodularity is hypermetabolic consistent with known neoplasm. Bilateral small hilar lymph nodes are mildly hypermetabolic. These are indeterminate. Area of neoplastic spread is technically in the differential but there the other options as well based on the distribution. Short-term follow up CT [place] be of some benefit. Previously seen prominent mediastinal nodes are without abnormal uptake. Small right femoral and inguinal nodes with minimal uptake. Low-level uptake along the branching nodular focus in the superior segment right lower lobe seen on prior CT. Again this could be mucous plugging or  infectious or inflammatory. Recommend attention on follow-up CT scan. Small cystic foci in the liver are not hypermetabolic. Please correlate with prior CT recommendations. Electronically Signed   By: Ranell Bring M.D.   On: 05/31/2024 14:07

## 2024-06-16 NOTE — Assessment & Plan Note (Signed)
Chemotherapy as listed above. 

## 2024-06-16 NOTE — Assessment & Plan Note (Signed)
 Encourage oral hydration.  Patient Simonin liberate salt intake IVF NS 500ml x 1 today Check urine osmolarity, serum osmolarity, urine sodium.

## 2024-06-16 NOTE — Assessment & Plan Note (Addendum)
 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 PET scan results reviewed and discussed with patient. The diagnosis and care plan were discussed with patient in detail.   Recommend R Mini CHOP, intrathecal chemotherapy with methotrexate, radiation.  Prechemo MUGA showed LVEF 51%  Labs are reviewed and discussed with patient. Proceed with cycle 1 R - Mini CHOP.  D4 GCSF - recommend claritin 10mg  daily for 4 days. He gets Dexamethasone  on D1 and he takes prednisone 100mg  D2-5  Antiemetic instructions discussed with patient and partner.

## 2024-06-16 NOTE — Patient Instructions (Addendum)
 Prednisone 50mg    Take 2 tablets (100mg ) daily for 4 days- From 06/17/24 to 06/20/24

## 2024-06-16 NOTE — Progress Notes (Signed)
 Nutrition Assessment:  New chemo start  83 year old male with b cell lymphoma.  Past medical history of CAD, NSTEMI, HLD, HTN, IBS, GERD, Heart failure, pancreatic lesion.  Patient s/p orchiectomy pm 5/23.   Patient starting R-mini CHOP  Met with patient during infusion.  Reports that he has a good/normal appetite.  Breakfast is usually fruits, oatmeal or cream of wheat, sometimes an egg and [place] bacon, sometimes pancake or sausage link.  Has whole wheat toast or english muffin.  Lunch is soup and sandwich and fruit.  Supper is meat and couple vegetables mostly.  Snacks on fruit in the afternoon.  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  Has been staying away from lettuce and cabbage and seeds due to diverticulosis.  Sometimes has ice cream.     Medications: colace, compazine , zofran   Labs: Na 126, glucose 113  Anthropometrics:   Height: 66 inches Weight: 130 lb  UBW: 128 lb -133 lb since 09/01/2023 per chart BMI: 20   Estimated Energy Needs  Kcals: 1500-1800 Protein: 75-90 g Fluid: 1500-1800 ml  NUTRITION DIAGNOSIS: Food and nutrition related knowledge deficit related to new diagnosis of cancer as evidenced by referral to RD    INTERVENTION:  Discussed importance of good nutrition and weight maintenance during treatment Discussed diet recommendations for diverticulosis (high fiber foods including fruits, vegetables, whole grains, beans, nuts) to keep bowels moving.   Encouraged foods rich in protein at every meal.  Nutrition During Cancer Treatment handout provided to patient Discussed that ensure/boost shakes can be included in diet to provide additional nutrition Contact information provided    MONITORING, EVALUATION, GOAL: weight trends, intake   NEXT VISIT: during treatment  Kyri Shader B. Dasie SOLON, CSO, LDN Registered Dietitian [phone]

## 2024-06-19 ENCOUNTER — Inpatient Hospital Stay

## 2024-06-19 ENCOUNTER — Telehealth: Payer: Self-pay

## 2024-06-19 DIAGNOSIS — C8580 Other specified types of non-Hodgkin lymphoma, unspecified site: Secondary | ICD-10-CM

## 2024-06-19 DIAGNOSIS — Z5111 Encounter for antineoplastic chemotherapy: Secondary | ICD-10-CM | POA: Diagnosis not present

## 2024-06-19 MED ORDER — PEGFILGRASTIM-CBQV 6 MG/0.6ML ~~LOC~~ SOSY
6.0000 mg | PREFILLED_SYRINGE | Freq: Once | SUBCUTANEOUS | Status: AC
  Administered 2024-06-19: 6 mg via SUBCUTANEOUS
  Filled 2024-06-19: qty 0.6

## 2024-06-19 NOTE — Telephone Encounter (Signed)
Telephone call to patient for follow up after receiving first infusion.   No answer but left message stating we were calling to check on them.  Encouraged patient to call for any questions or concerns.   

## 2024-06-20 ENCOUNTER — Inpatient Hospital Stay: Attending: Oncology

## 2024-06-20 ENCOUNTER — Telehealth: Payer: Self-pay

## 2024-06-20 DIAGNOSIS — C83398 Diffuse large b-cell lymphoma of other extranodal and solid organ sites: Secondary | ICD-10-CM | POA: Insufficient documentation

## 2024-06-20 DIAGNOSIS — Z5111 Encounter for antineoplastic chemotherapy: Secondary | ICD-10-CM | POA: Insufficient documentation

## 2024-06-20 DIAGNOSIS — B3781 Candidal esophagitis: Secondary | ICD-10-CM | POA: Insufficient documentation

## 2024-06-20 DIAGNOSIS — Z5112 Encounter for antineoplastic immunotherapy: Secondary | ICD-10-CM | POA: Insufficient documentation

## 2024-06-20 DIAGNOSIS — Z5189 Encounter for other specified aftercare: Secondary | ICD-10-CM | POA: Insufficient documentation

## 2024-06-20 DIAGNOSIS — E871 Hypo-osmolality and hyponatremia: Secondary | ICD-10-CM | POA: Insufficient documentation

## 2024-06-20 MED ORDER — OMEPRAZOLE 20 MG PO CPDR: 20.0000 mg | DELAYED_RELEASE_CAPSULE | Freq: Every day | ORAL | 1 refills | Status: DC

## 2024-06-20 NOTE — Progress Notes (Signed)
 [facility] CSW Progress Note  Clinical Child psychotherapist contacted caregiver by phone to follow-up on emotional support.    Interventions: Provided brief mental health counseling with regard to adjustment to illness.  Patient's wife, Will, reported that patient enjoys gardening and is having difficulty not being able to do so.  She also asked a medication question which CSW sent a secure chat to Dr. Layvonne RN.  CSW also provided the website for the [employer].  Will send gas card referral.       Follow Up Plan:  CSW will follow-up with patient by phone     Macario CHRISTELLA Au, LCSW Clinical Social Worker [facility]

## 2024-06-20 NOTE — Telephone Encounter (Signed)
 Received message from [place]  I just got off the phone with this patient's wife, Will. She said she is giving patient the nausea medicine for his indigestion and wanted to know if that was ok.  Called and spoke to [place] and informed her that Dr. Babara would like for him to take Omeprazole 20 mg daily to see if it helps with indigestion. Rx sent to [facility]. Claudia verbalized understanding.

## 2024-06-22 ENCOUNTER — Inpatient Hospital Stay

## 2024-06-22 ENCOUNTER — Inpatient Hospital Stay (HOSPITAL_BASED_OUTPATIENT_CLINIC_OR_DEPARTMENT_OTHER): Admitting: Oncology

## 2024-06-22 ENCOUNTER — Encounter: Payer: Self-pay | Admitting: Oncology

## 2024-06-22 ENCOUNTER — Other Ambulatory Visit: Payer: Self-pay

## 2024-06-22 VITALS — BP 133/52 | HR 58 | Temp 97.6°F | Resp 19 | Wt 137.6 lb

## 2024-06-22 DIAGNOSIS — Z5112 Encounter for antineoplastic immunotherapy: Secondary | ICD-10-CM | POA: Diagnosis not present

## 2024-06-22 DIAGNOSIS — C83398 Diffuse large b-cell lymphoma of other extranodal and solid organ sites: Secondary | ICD-10-CM | POA: Diagnosis present

## 2024-06-22 DIAGNOSIS — B3781 Candidal esophagitis: Secondary | ICD-10-CM | POA: Diagnosis not present

## 2024-06-22 DIAGNOSIS — E871 Hypo-osmolality and hyponatremia: Secondary | ICD-10-CM

## 2024-06-22 DIAGNOSIS — C8580 Other specified types of non-Hodgkin lymphoma, unspecified site: Secondary | ICD-10-CM | POA: Diagnosis not present

## 2024-06-22 DIAGNOSIS — Z5189 Encounter for other specified aftercare: Secondary | ICD-10-CM | POA: Diagnosis not present

## 2024-06-22 DIAGNOSIS — Z5111 Encounter for antineoplastic chemotherapy: Secondary | ICD-10-CM | POA: Diagnosis not present

## 2024-06-22 LAB — CMP (CANCER CENTER ONLY)
ALT: 67 U/L — ABNORMAL HIGH (ref 0–44)
AST: 30 U/L (ref 15–41)
Albumin: 3.6 g/dL (ref 3.5–5.0)
Alkaline Phosphatase: 180 U/L — ABNORMAL HIGH (ref 38–126)
Anion gap: 7 (ref 5–15)
BUN: 15 mg/dL (ref 8–23)
CO2: 25 mmol/L (ref 22–32)
Calcium: 8.1 mg/dL — ABNORMAL LOW (ref 8.9–10.3)
Chloride: 94 mmol/L — ABNORMAL LOW (ref 98–111)
Creatinine: 0.56 mg/dL — ABNORMAL LOW (ref 0.61–1.24)
GFR, Estimated: >60 mL/min (ref >60)
Glucose, Bld: 174 mg/dL — ABNORMAL HIGH (ref 70–99)
Potassium: 3.8 mmol/L (ref 3.5–5.1)
Sodium: 126 mmol/L — ABNORMAL LOW (ref 135–145)
Total Bilirubin: 1.2 mg/dL (ref 0.0–1.2)
Total Protein: 6.3 g/dL — ABNORMAL LOW (ref 6.5–8.1)

## 2024-06-22 LAB — CBC WITH DIFFERENTIAL (CANCER CENTER ONLY)
Abs Immature Granulocytes: 2.17 10*3/uL — ABNORMAL HIGH (ref 0.00–0.07)
Basophils Absolute: 0 10*3/uL (ref 0.0–0.1)
Basophils Relative: 0 %
Eosinophils Absolute: 0.2 10*3/uL (ref 0.0–0.5)
Eosinophils Relative: 1 %
HCT: 32.4 % — ABNORMAL LOW (ref 39.0–52.0)
Hemoglobin: 11.1 g/dL — ABNORMAL LOW (ref 13.0–17.0)
Immature Granulocytes: 6 %
Lymphocytes Relative: 5 %
Lymphs Abs: 1.5 10*3/uL (ref 0.7–4.0)
MCH: 30.4 pg (ref 26.0–34.0)
MCHC: 34.3 g/dL (ref 30.0–36.0)
MCV: 88.8 fL (ref 80.0–100.0)
Monocytes Absolute: 0.8 10*3/uL (ref 0.1–1.0)
Monocytes Relative: 2 %
Neutro Abs: 29.3 10*3/uL — ABNORMAL HIGH (ref 1.7–7.7)
Neutrophils Relative %: 86 %
Platelet Count: 169 10*3/uL (ref 150–400)
RBC: 3.65 MIL/uL — ABNORMAL LOW (ref 4.22–5.81)
RDW: 12.4 % (ref 11.5–15.5)
Smear Review: NORMAL
WBC Count: 34 10*3/uL — ABNORMAL HIGH (ref 4.0–10.5)
nRBC: 0 % (ref 0.0–0.2)

## 2024-06-22 LAB — URIC ACID: Uric Acid, Serum: 2.9 mg/dL — ABNORMAL LOW (ref 3.7–8.6)

## 2024-06-22 MED ORDER — HEPARIN SOD (PORK) LOCK FLUSH 100 UNIT/ML IV SOLN
500.0000 [IU] | Freq: Once | INTRAVENOUS | Status: AC
  Administered 2024-06-22: 500 [IU] via INTRAVENOUS
  Filled 2024-06-22: qty 5

## 2024-06-22 MED ORDER — SODIUM CHLORIDE 0.9 % IV SOLN: 2.0000 g | Freq: Once | INTRAVENOUS | Status: DC

## 2024-06-22 MED ORDER — SODIUM CHLORIDE 0.9 % IV SOLN
INTRAVENOUS | Status: DC
  Filled 2024-06-22: qty 250

## 2024-06-22 MED ORDER — CALCIUM GLUCONATE-NACL 2-0.675 GM/100ML-% IV SOLN
2.0000 g | Freq: Once | INTRAVENOUS | Status: AC
  Administered 2024-06-22: 2000 mg via INTRAVENOUS
  Filled 2024-06-22: qty 100

## 2024-06-22 MED ORDER — CALCIUM CARBONATE ANTACID 500 MG PO CHEW: 2.0000 | CHEWABLE_TABLET | Freq: Every day | ORAL | Status: AC

## 2024-06-22 NOTE — Progress Notes (Signed)
 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  Ki-67 70% Right orchiectomy pathology results were reviewed and discussed with patient. Recommend R Mini CHOP, intrathecal chemotherapy with methotrexate, radiation.  Prechemo MUGA showed LVEF 51%.  I have asked pathology to add FISH testing for Bcl-2/BCL6/c-Myc.  Labs are reviewed and discussed with patient. Status post cycle 1 R - Mini CHOP.  D4 GCSF - recommend claritin 10mg  daily for 4 days. He gets Dexamethasone  on D1 and he takes prednisone 100mg  D2-5.  Plan to add intrathecal methotrexate with cycle 2 treatments. Overall he tolerates well.  Obtain MRI brain w wo contrast  Hyponatremia Intermittent chronic issue for him.  He is asymptomatic.  Patient [name] liberate salt intake Check urine osmolarity, serum osmolarity, urine sodium.  Hypocalcemia Recommend patient to take calcium  1200 mg daily.  Take vitamin D supplementation. IV calcium  gluconate 2 g x 1 today.   Orders Placed This Encounter  Procedures   MR Brain W Wo Contrast    Standing Status:   Future    Expected Date:   06/29/2024    Expiration Date:   06/22/2025    If indicated for the ordered procedure, I authorize the administration of contrast media per Radiology protocol:   Yes    What is the patient's sedation requirement?:   No Sedation    Does the patient have a pacemaker or implanted devices?:   No    Use SRS Protocol?:   Yes    Preferred imaging location?:   [facility] (table limit - 500lbs)   Osmolality,  urine    Standing Status:   Future    Expected Date:   07/10/2024    Expiration Date:   07/10/2025   Osmolality    Standing Status:   Future    Expected Date:   07/10/2024    Expiration Date:   07/10/2025   Sodium, urine, random   Follow up 2 weeks All questions were answered. The patient knows to call the clinic with any problems, questions or concerns.  Call Babara, MD, PhD [facility] Hematology Oncology 06/22/2024    HISTORY OF PRESENTING ILLNESS:  [name] 82 y.o. male presents to establish care for testicular lymphoma.   Oncology History  Lymphoma, large cell (HCC)  04/25/2024 Imaging   ultrasound scrotum with Doppler showed Diffusely heterogeneous right testicular echotexture, most consistent with dominant mass or masses. Multiple left testicular masses. Favor lymphoproliferative process such as lymphoma or leukemia. Bilateral primary testicular neoplasms felt less likely.   Right epididymal enlargement and heterogeneity, favoring epididymitis. The appearance of the testicles is felt unlikely to be be infectious/inflammatory.   04/28/2024 Imaging   CT chest with contrast abdomen pelvis with and without contrast showed 1. Heterogeneous enhancement in both testicles with substantial abnormal enlargement of the right testicle. This is suspicious for testicular neoplasm with lymphoblastic leukemia, lymphoma, or conceivably metastatic disease as top differential diagnostic considerations. Testicular adrenal rest tumors are a differential diagnostic consideration. 2. No overtly pathologic adenopathy identified. 3. Severe parenchymal atrophy of the pancreatic tail and body probably due  to a 1.4 cm calcification/stone along the dorsal pancreatic duct at the junction of the body and head. Similar severe atrophy was shown on the CT chest from 01/07/2020. 4. Branching density in the superior segment right lower lobe favoring bronchiectasis with airway plugging. 5. Small type 1  hiatal hernia. 6. Prominent stool throughout the colon favors constipation. 7. Chronic appearing anterior wedge compressions at L1 and L2. 8. Lumbar spondylosis and degenerative disc disease at L5-S1 resulting in mild bilateral foraminal impingement. 9.  Aortic Atherosclerosis (ICD10-I70.0). 10. Nonspecific lesion peripherally in the right hepatic lobe. This could be further characterized with hepatic protocol MRI with and without contrast.   05/04/2024 Initial Diagnosis   Testicular lymphoma, large cell   He has experienced scrotal swelling since a hydrocele repair in 2022. The swelling decreased slightly post-surgery but never fully resolved. Recently, he received a report indicating concerns in both testicles, prompting further evaluation.    He reports neck soreness and swelling, initially attributed to physical activity such as painting and building a fence. The neck soreness is described as 'almost miserable.'   He mentions unintentional weight loss, noting a decrease from his usual weight of 135-140 pounds to 122 pounds over the past year. Despite efforts to regain weight, including going on cruises, he has not been successful. He currently weighs 133 pounds with clothes on.   No excessive night sweats. He feels cold most of the time but has experienced some fever. No family history of cancer but there is a family history of high cholesterol and heart problems. He has not had a PSA test but underwent a colonoscopy about three years ago.    05/24/2024 Cancer Staging   Staging form: Hodgkin and Non-Hodgkin Lymphoma, AJCC 8th Edition - Clinical stage from 05/24/2024: Stage III (Diffuse large B-cell lymphoma) - Signed by Babara Call, MD on 06/01/2024 Stage prefix: Initial diagnosis   06/16/2024 -  Chemotherapy   Patient is on Treatment Plan : NON-HODGKINS LYMPHOMA R-CHOP q21d      Today patient presents to discuss results. Patient reports chronic right shoulder pain and left neck  pain. Appetite is fair.  Patient has had a Mediport placed no new concerns.     MEDICAL HISTORY:  Past Medical History:  Diagnosis Date   Arthritis    Bilateral pleural effusion    Bone spur 2010   right shoulder   CAD (coronary artery disease)    a. 12/2019 NSTEMI/Cath: LM 99ost/m, LAD 40, LCX nl, OM1 80, RCA 70ost/p, 99p, 30d, EF 45-50%; b. 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   Complication of anesthesia    a.) delayed emergence; b.) emergence delirium   Diverticulosis    GERD (gastroesophageal reflux disease)    H/O atrial flutter    HFrEF (heart failure with reduced ejection fraction) (HCC)    a.) TEE 01/08/2020: EF 45-50%; b.) TTE 04/14/2022: EF 50-55%, septal HK, G2DD, GLS /18.3%, mild-mod MR, AoV sclerosis without stenosis.   Hyperlipidemia    Hypertension    Hyponatremia    IBS (irritable bowel syndrome)    Ischemic cardiomyopathy    a. 12/2019 TEE: EF 45-50%. Nl RV fxn. Mild MR.   NSTEMI (non-ST elevated myocardial infarction) (HCC) 01/07/2020   a.) LHC 01/08/2020: EF 45-50%, LVEDP 22 mmHg; 99% o-mLM, 80% OM1, 40% mLAD, 70% o-pRCA, 99% pRCA, 30% dRCA --> consult CVTS; b.) 4v CABG 01/07/2022: LIMA-LAD, SVG-RPDA, SVG-OM1, SVG-OM2.   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   Skin cancer    a.) s/p Mohs surgery   Statin intolerance    Testicular mass     SURGICAL HISTORY: Past Surgical History:  Procedure Laterality Date   BACK SURGERY     Lumbar   BUNIONECTOMY     CATARACT EXTRACTION, BILATERAL     COLONOSCOPY     CORONARY ARTERY BYPASS GRAFT N/A 01/08/2020   Procedure: CORONARY ARTERY BYPASS GRAFTING (CABG) x 4  WITH ENDOSCOPIC HARVESTING OF BILATERAL GREATER SAPHENOUS VEINS;  Surgeon: Fleeta Hanford Coy, MD;  Location: [facility] OR;  Service: Open Heart  Surgery;  Laterality: N/A;   HEMORRHOID SURGERY  2004   HYDROCELE EXCISION     INGUINAL HERNIA REPAIR  01/2010   IR IMAGING GUIDED PORT INSERTION  06/02/2024   LEFT HEART CATH AND CORONARY ANGIOGRAPHY N/A 01/08/2020   Procedure: LEFT HEART CATH AND CORONARY ANGIOGRAPHY;  Surgeon: Darron Deatrice LABOR, MD;  Location: [facility] INVASIVE CV LAB;  Service: Cardiovascular;  Laterality: N/A;   ORCHIECTOMY Right 05/12/2024   Procedure: ORCHIECTOMY;  Surgeon: Francisca Redell BROCKS, MD;  Location: [facility] ORS;  Service: Urology;  Laterality: Right;   ROTATOR CUFF REPAIR Left 06/2010   left/ bone spur Dr.Blackman   SHOULDER ARTHROSCOPY WITH OPEN ROTATOR CUFF REPAIR Right 05/26/2018   Procedure: SHOULDER ARTHROSCOPY WITH OPEN ROTATOR CUFF REPAIR;  Surgeon: Edie Norleen PARAS, MD;  Location: [facility] ORS;  Service: Orthopedics;  Laterality: Right;  debridement, decompression   SKIN CANCER EXCISION  2003-2005   Mohs-right shoulder   TEE WITHOUT CARDIOVERSION N/A 01/08/2020   Procedure: TRANSESOPHAGEAL ECHOCARDIOGRAM (TEE);  Surgeon: Fleeta Hanford, Coy, MD;  Location: [facility] OR;  Service: Open Heart Surgery;  Laterality: N/A;   TOTAL HIP ARTHROPLASTY Right 07/14/2022   Procedure: TOTAL HIP ARTHROPLASTY ANTERIOR APPROACH;  Surgeon: Kathlynn Sharper, MD;  Location: [facility] ORS;  Service: Orthopedics;  Laterality: Right;    SOCIAL HISTORY: Social History   Socioeconomic History   Marital status: Married    Spouse name: Not on file   Number of children: 2   Years of education: Not on file   Highest education level: Not on file  Occupational History   Occupation: retired Academic librarian: retired  Tobacco Use   Smoking status: Never   Smokeless tobacco: Never  Vaping Use   Vaping status: Never Used  Substance and Sexual Activity   Alcohol use: Not Currently    Alcohol/week: 1.0 standard drink of alcohol    Types: 1 Glasses of wine per week    Comment: very rare   Drug use: No   Sexual activity: Not on  file  Other Topics Concern   Not on file  Social History Narrative   2 sons, no contact   Splits his time between [place]  and [place]    Has live-in since 2005   Has living will   Requests Bernice--girlfriend or brother Lamar, as health care POA   Would accept resuscitation but no prolonged artificial ventilation   Wouldn't want tube feeds if cognitively unaware   Social Drivers of Health   Financial Resource Strain: Low Risk  (04/05/2024)   Received from [facility]   Overall Financial Resource Strain (CARDIA)    Difficulty of Paying Living Expenses: Not hard at all  Food Insecurity: No Food  Insecurity (04/05/2024)   Received from [facility]   Hunger Vital Sign    Within the past 12 months, you worried that your food would run out before you got the money to buy more.: Never true    Within the past 12 months, the food you bought just didn't last and you didn't have money to get more.: Never true  Transportation Needs: No Transportation Needs (04/05/2024)   Received from [facility] - Transportation    In the past 12 months, has lack of transportation kept you from medical appointments or from getting medications?: No    Lack of Transportation (Non-Medical): No  Physical Activity: Not on file  Stress: Not on file  Social Connections: Not on file  Intimate Partner Violence: Not on file    FAMILY HISTORY: Family History  Problem Relation Age of Onset   Heart disease Mother    Alzheimer's disease Mother    Heart attack Father    Heart disease Brother    Stroke Brother    Cancer Neg Hx    Diabetes Neg Hx    Colon cancer Neg Hx    Esophageal cancer Neg Hx    Stomach cancer Neg Hx    Pancreatic cancer Neg Hx     ALLERGIES:  is allergic to crestor [rosuvastatin], penicillins, peanut-containing drug products, atorvastatin, and simvastatin.  MEDICATIONS:  Current Outpatient Medications  Medication Sig  Dispense Refill   acetaminophen  (TYLENOL ) 500 MG tablet Take 500-1,000 mg by mouth every 6 (six) hours as needed for moderate pain (pain score 4-6).     acyclovir  (ZOVIRAX ) 400 MG tablet Take 1 tablet (400 mg total) by mouth daily. 30 tablet 3   allopurinol  (ZYLOPRIM ) 300 MG tablet Take 1 tablet (300 mg total) by mouth daily. 30 tablet 3   amLODipine  (NORVASC ) 5 MG tablet Take 1 tablet (5 mg total) by mouth daily. (Patient taking differently: Take 5 mg by mouth every morning.) 180 tablet 3   calcium  carbonate (TUMS - DOSED IN MG ELEMENTAL CALCIUM ) 500 MG chewable tablet Chew 2 tablets (400 mg of elemental calcium  total) by mouth daily.     docusate sodium  (COLACE) 100 MG capsule Take 1 capsule (100 mg total) by mouth 2 (two) times daily. (Patient taking differently: Take 100 mg by mouth every evening.) 10 capsule 0   Evolocumab  (REPATHA  SURECLICK) 140 MG/ML SOAJ Inject 140 mg into the skin every 14 (fourteen) days. 2 mL 11   lidocaine -prilocaine  (EMLA ) cream Apply to affected area once 30 g 3   metoprolol  succinate (TOPROL -XL) 25 MG 24 hr tablet Take 0.5 tablets (12.5 mg total) by mouth daily. (Patient taking differently: Take 12.5 mg by mouth every morning.) 45 tablet 3   nitroGLYCERIN  (NITROSTAT ) 0.4 MG SL tablet Place 0.4 mg under the tongue every 5 (five) minutes as needed for chest pain.     omeprazole (PRILOSEC) 20 MG capsule Take 1 capsule (20 mg total) by mouth daily. 30 capsule 1   ondansetron  (ZOFRAN ) 8 MG tablet Take 1 tablet (8 mg total) by mouth every 8 (eight) hours as needed for nausea or vomiting. Start on the third day after cyclophosphamide chemotherapy. 30 tablet 1   predniSONE (DELTASONE) 50 MG tablet Take 2 tablets (100 mg total) by mouth See admin instructions. Take 100mg  daily on Day 2-5 of chemotherapy. 8 tablet 5   prochlorperazine  (COMPAZINE ) 10 MG tablet Take 1 tablet (10 mg total) by mouth every 6 (six) hours  as needed for nausea or vomiting. 30 tablet 6   tadalafil   (CIALIS ) 10 MG tablet Take 1-2 tablets (10-20 mg total) by mouth daily as needed for erectile dysfunction (take 1 hour prior to sexual activity). 30 tablet 11   traMADol  (ULTRAM ) 50 MG tablet Take 1 tablet (50 mg total) by mouth every 6 (six) hours. 30 tablet 0   omega-3 acid ethyl esters (LOVAZA ) 1 g capsule Take 1 g by mouth every other day.     No current facility-administered medications for this visit.    Review of Systems  Constitutional:  Positive for fatigue. Negative for appetite change, chills, fever and unexpected weight change.  HENT:   Negative for hearing loss and voice change.   Eyes:  Negative for eye problems and icterus.  Respiratory:  Negative for chest tightness, cough and shortness of breath.   Cardiovascular:  Negative for chest pain and leg swelling.  Gastrointestinal:  Negative for abdominal distention and abdominal pain.  Endocrine: Negative for hot flashes.  Genitourinary:  Negative for difficulty urinating, dysuria and frequency.        Status post right orchiectomy  Musculoskeletal:  Positive for arthralgias and neck pain.  Skin:  Negative for itching and rash.  Neurological:  Negative for light-headedness and numbness.  Hematological:  Negative for adenopathy. Does not bruise/bleed easily.  Psychiatric/Behavioral:  Negative for confusion.      PHYSICAL EXAMINATION: ECOG PERFORMANCE STATUS: 1 - Symptomatic but completely ambulatory  Vitals:   06/22/24 1333  BP: (!) 133/52  Pulse: (!) 58  Resp: 19  Temp: 97.6 F (36.4 C)  SpO2: 99%   Filed Weights   06/22/24 1333  Weight: 137 lb 9.6 oz (62.4 kg)    Physical Exam Constitutional:      General: He is not in acute distress.    Appearance: He is not diaphoretic.  HENT:     Head: Normocephalic and atraumatic.  Eyes:     General: No scleral icterus. Cardiovascular:     Rate and Rhythm: Normal rate and regular rhythm.     Heart sounds: No murmur heard. Pulmonary:     Effort: Pulmonary effort is  normal. No respiratory distress.     Breath sounds: No wheezing.  Abdominal:     General: There is no distension.     Palpations: Abdomen is soft.     Tenderness: There is no abdominal tenderness.  Musculoskeletal:        General: Normal range of motion.     Cervical back: Normal range of motion and neck supple.  Skin:    General: Skin is warm and dry.     Findings: No erythema.  Neurological:     Mental Status: He is alert and oriented to person, place, and time. Mental status is at baseline.     Motor: No abnormal muscle tone.  Psychiatric:        Mood and Affect: Affect normal.     LABORATORY DATA:  I have reviewed the data as listed    Latest Ref Rng & Units 06/22/2024    1:06 PM 06/16/2024    8:34 AM 05/04/2024    4:13 PM  CBC  WBC 4.0 - 10.5 K/uL 34.0  6.4  8.4   Hemoglobin 13.0 - 17.0 g/dL 88.8  88.0  86.1   Hematocrit 39.0 - 52.0 % 32.4  34.2  40.6   Platelets 150 - 400 K/uL 169  230  278       Latest  Ref Rng & Units 06/22/2024    1:06 PM 06/16/2024    8:34 AM 05/04/2024    4:13 PM  CMP  Glucose 70 - 99 mg/dL 825  886  892   BUN 8 - 23 mg/dL 15  11  13    Creatinine 0.61 - 1.24 mg/dL 9.43  9.36  9.09   Sodium 135 - 145 mmol/L 126  126  132   Potassium 3.5 - 5.1 mmol/L 3.8  4.0  4.0   Chloride 98 - 111 mmol/L 94  94  95   CO2 22 - 32 mmol/L 25  24  25    Calcium  8.9 - 10.3 mg/dL 8.1  8.9  9.3   Total Protein 6.5 - 8.1 g/dL 6.3  7.0  8.5   Total Bilirubin 0.0 - 1.2 mg/dL 1.2  1.3  1.4   Alkaline Phos 38 - 126 U/L 180  139  178   AST 15 - 41 U/L 30  21  28    ALT 0 - 44 U/L 67  17  21      RADIOGRAPHIC STUDIES: I have personally reviewed the radiological images as listed and agreed with the findings in the report. NM Cardiac Muga Rest Result Date: 06/08/2024 CLINICAL DATA:  Testicular lymphoma.  Chemotherapy EXAM: NUCLEAR MEDICINE CARDIAC BLOOD POOL IMAGING (MUGA) TECHNIQUE: Cardiac multi-gated acquisition was performed at rest following intravenous injection of  Tc-73m labeled red blood cells. RADIOPHARMACEUTICALS:  22.9 mCi Tc-26m pertechnetate in-vitro labeled red blood cells IV COMPARISON:  None Available. FINDINGS: No  focal wall motion abnormality of the left ventricle. Calculated left ventricular ejection fraction equals 51% IMPRESSION: Left ventricular ejection fraction equals51 %. Electronically Signed   By: Jackquline Boxer M.D.   On: 06/08/2024 15:29   IR IMAGING GUIDED PORT INSERTION Result Date: 06/02/2024 CLINICAL DATA:  B-cell lymphoma of the testicle. EXAM: Chest port catheter placement TECHNIQUE: Procedure performed using fluoroscopy and ultrasound CONTRAST:  None RADIOPHARMACEUTICALS:  None FLUOROSCOPY: 0.4 minutes 1 mGy COMPARISON:  None FINDINGS: The patient was placed in supine position on the IR gantry and the right upper chest and neck were prepped and draped in the usual sterile fashion. The nurse administered intravenous fentanyl  and Versed  under my supervision and the nurse had no other injuries other than monitoring the patient and administering medications. I was present for the entire duration of procedure. 1 mg intravenous Versed  and 50 mcg intravenous fentanyl  for a total sedation time of 18 minutes. Ultrasound guidance was used to investigate the right internal jugular vein which was anechoic and compressible indicating patency. The needle was then advanced from a scan negative through the soft tissue into the right internal jugular vein under ultrasound guidance. A final image was obtained and stored in the patient's permanent medical record. Access was then exchanged over a guidewire which was advanced under fluoroscopic guidance. The needle was removed and replaced with a micropuncture sheath. Approximately 2 inches below the clavicle the port pocket was created with a subsequent incision. The catheter was then tunneled from the port pocket to the venotomy site overlying the right internal jugular vein. Access was then exchanged over an  035 guidewire for peel-away sheath which was advanced over the guidewire under fluoroscopic guidance. The catheter was then advanced through the peel-away sheath to the sinoatrial junction. Sheath was removed. The catheter was then cut at the port pocket and connected to chest port. The chest port was tested for function and finally function well. The chest port was  then flushed with heparin  and a port pocket was closed with 4-0 suture. Final image was obtained demonstrating satisfactory position of chest port. The final count of all materials was satisfactory. IMPRESSION: 1. Satisfactory placement of right internal jugular vein single-lumen chest port. The catheter tip is at the sinoatrial junction. 2.  Okay to use and power inject chest port. Electronically Signed   By: Cordella Banner   On: 06/02/2024 10:59   NM PET Image Initial (PI) Skull Base To Thigh Result Date: 05/31/2024 CLINICAL DATA:  Initial treatment strategy for testicular lymphoma. EXAM: NUCLEAR MEDICINE PET SKULL BASE TO THIGH TECHNIQUE: 7.23 mCi F-18 FDG was injected intravenously. Full-ring PET imaging was performed from the skull base to thigh after the radiotracer. CT data was obtained and used for attenuation correction and anatomic localization. Fasting blood glucose: 97 mg/dl COMPARISON:  CT chest abdomen pelvis 04/28/2024. Scrotal ultrasound 04/25/2024 FINDINGS: Mediastinal blood pool activity: SUV max 2.0 Liver activity: SUV max 2.5 NECK: No specific abnormal uptake seen above blood pool in the neck including along lymph node change of the submandibular, posterior triangle or internal jugular region. Near symmetric uptake of the visualized intracranial compartment. Incidental CT findings: The parotid glands, submandibular glands are unremarkable. Thyroid  glands preserved. Scattered vascular calcifications. There is some streak artifact related to the patient's dental hardware. Visualized paranasal sinuses and mastoid air cells are  clear. CHEST: On the prior chest CT scan there is some small bilateral hilar lymph nodes. Some the show uptake. Example left hilar lesion has maximum SUV value of 4.9 and correspond to a node on the prior contrast CT scan measuring 8 mm in short axis. This is seen today on image 61. Small right-sided hilar nodes also seen such as image 64 with uptake of 3.5 maximum SUV. No abnormal uptake otherwise along nodal chains in the axillary regions. Previous described prominent paratracheal nodes have low-level uptake. Example the node described right paratracheal the common near the carina and is again seen image 57 today and similar in size but this particular node does not have significant uptake with maximum SUV of 2.3. Small focus of uptake in the right lung maximum SUV value of 1.6 corresponding to a small nodular area in the superior segment of the right lower lobe on image 57. This is branching MeV bronchial in origin. Otherwise no significant abnormal uptake along the lung parenchyma. Incidental CT findings: Breathing motion. No pleural effusion or consolidation. Status post median sternotomy. Heart is nonenlarged. No pericardial effusion. Small hiatal hernia. The thoracic aorta is normal course and caliber. ABDOMEN/PELVIS: There is physiologic distribution radiotracer along the parenchymal organs, bowel and renal collecting systems. Only minimal nonspecific bilateral adrenal uptake. Specifically the spleen is nonenlarged. No abnormal splenic activity. There are nodular areas uptake along the scrotum towards the testicles consistent with known history. The subtle confluent area on the right has maximum SUV of 12.4. The left side there are several adjacent clustered nodular areas of uptake maximum SUV value dislocation of 9.2. Is presence of scrotal hydrocele as well as nodular tissue on CT. Edema is seen tracking along the inguinal canal region on the right greater than left. Surgical changes along the anterior  right side of the perineum with mesh. Please correlate for prior hernia repair or other process. No specific abnormal uptake along lymph node chains of the abdomen and pelvis. Exception is some uptake within a small right femoral node maximum SUV 3.0 and node on image 136 just medial to the common  femoral vein measures 11 mm in short axis. This areas partially obscured by the streak artifact from the patient's right hip arthroplasty. The there is some mild uptake within right inguinal node maximum SUV of only 1.8. Incidental CT findings: Enlarged prostate with mass effect along the bladder. Wall thickening of the bladder. Diffuse vascular calcifications identified including aorta and branch vessels. Atrophic pancreas. Gallbladder is present. Small cystic foci along the liver are incompletely evaluated on this examination but not hypermetabolic. Please correlate prior contrast CT. No renal or ureteral stones. Bowel is nondilated. Scattered colonic stool. No stomach dilatation. Small bowel is nondilated. No free air or free fluid. SKELETON: No abnormal uptake identified along the visualized osseous structures. 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 Incidental CT findings: Diffuse degenerative changes identified. Mild wedge deformity of L1-L2. IMPRESSION: Lobular appearance to the scrotum with soft tissue as seen on the prior CT scan as well as edema. Scrotal nodularity is hypermetabolic consistent with known neoplasm. Bilateral small hilar lymph nodes are mildly hypermetabolic. These are indeterminate. Area of neoplastic spread is technically in the differential but there the other options as well based on the distribution. Short-term follow up CT [place] be of some benefit. Previously seen prominent mediastinal nodes are without abnormal uptake. Small right femoral and inguinal nodes with minimal uptake. Low-level uptake along the branching nodular focus in the superior segment right lower lobe  seen on prior CT. Again this could be mucous plugging or infectious or inflammatory. Recommend attention on follow-up CT scan. Small cystic foci in the liver are not hypermetabolic. Please correlate with prior CT recommendations. Electronically Signed   By: Ranell Bring M.D.   On: 05/31/2024 14:07

## 2024-06-22 NOTE — Assessment & Plan Note (Addendum)
 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  Ki-67 70% Right orchiectomy pathology results were reviewed and discussed with patient. Recommend R Mini CHOP, intrathecal chemotherapy with methotrexate, radiation.  Prechemo MUGA showed LVEF 51%.  I have asked pathology to add FISH testing for Bcl-2/BCL6/c-Myc.  Labs are reviewed and discussed with patient. Status post cycle 1 R - Mini CHOP.  D4 GCSF - recommend claritin 10mg  daily for 4 days. He gets Dexamethasone  on D1 and he takes prednisone 100mg  D2-5.  Plan to add intrathecal methotrexate with cycle 2 treatments. Overall he tolerates well.  Obtain MRI brain w wo contrast

## 2024-06-22 NOTE — Patient Instructions (Signed)
Calcium Gluconate Injection What is this medication? CALCIUM GLUCONATE (KAL see um GLOO koe nate) increases calcium levels in your body. Calcium is a mineral that plays an important role in building strong bones and maintaining heart health. This medicine may be used for other purposes; ask your health care provider or pharmacist if you have questions. What should I tell my care team before I take this medication? They need to know if you have any of these conditions: High levels of calcium in the blood History of irregular heartbeat History of kidney stones Kidney disease Parathyroid disease An unusual or allergic reaction to calcium, other medications, foods, dyes, or preservatives Pregnant or trying to get pregnant Breast-feeding How should I use this medication? This medication is injected into a vein. It is given by your care team in a hospital or clinic setting. Talk to your care team about the use of this medication in children. While it may be given to children for selected conditions, precautions do apply. Overdosage: If you think you have taken too much of this medicine contact a poison control center or emergency room at once. NOTE: This medicine is only for you. Do not share this medicine with others. What if I miss a dose? This does not apply. What may interact with this medication? Ceftriaxone Certain diuretics Digoxin Other calcium products This list may not describe all possible interactions. Give your health care provider a list of all the medicines, herbs, non-prescription drugs, or dietary supplements you use. Also tell them if you smoke, drink alcohol, or use illegal drugs. Some items may interact with your medicine. What should I watch for while using this medication? Your condition will be monitored carefully while you are receiving this medication. You may need blood work while you are taking this medication. What side effects may I notice from receiving this  medication? Side effects that you should report to your care team as soon as possible: Allergic reactions--skin rash, itching, hives, swelling of the face, lips, tongue, or throat Fast or irregular heartbeat High calcium level--increased thirst or amount of urine, nausea, vomiting, confusion, unusual weakness or fatigue, bone pain Low blood pressure--dizziness, feeling faint or lightheaded, blurry vision Pain, redness, or irritation at injection site Side effects that usually do not require medical attention (report to your care team if they continue or are bothersome): Change in taste Flushing, mostly over the face, neck, and chest, during injection This list may not describe all possible side effects. Call your doctor for medical advice about side effects. You may report side effects to FDA at 1-800-FDA-1088. Where should I keep my medication? This medication is given in a hospital or clinic. It will not be stored at home. NOTE: This sheet is a summary. It may not cover all possible information. If you have questions about this medicine, talk to your doctor, pharmacist, or health care provider.  2024 [employer]vier/Gold Standard (2022-06-29 00:00:00)

## 2024-06-22 NOTE — Assessment & Plan Note (Signed)
 Recommend patient to take calcium  1200 mg daily.  Take vitamin D supplementation. IV calcium  gluconate 2 g x 1 today.

## 2024-06-22 NOTE — Assessment & Plan Note (Signed)
 Intermittent chronic issue for him.  He is asymptomatic.  Patient [name] liberate salt intake Check urine osmolarity, serum osmolarity, urine sodium.

## 2024-06-26 NOTE — Addendum Note (Signed)
 Encounter addended by: Janice Lynwood BROCKS on: 06/26/2024 1:33 PM  Actions taken: Imaging Exam ended

## 2024-06-27 ENCOUNTER — Other Ambulatory Visit: Payer: Self-pay

## 2024-06-27 ENCOUNTER — Inpatient Hospital Stay (HOSPITAL_BASED_OUTPATIENT_CLINIC_OR_DEPARTMENT_OTHER): Admitting: Nurse Practitioner

## 2024-06-27 ENCOUNTER — Inpatient Hospital Stay

## 2024-06-27 ENCOUNTER — Telehealth: Payer: Self-pay | Admitting: *Deleted

## 2024-06-27 ENCOUNTER — Encounter: Payer: Self-pay | Admitting: Nurse Practitioner

## 2024-06-27 VITALS — BP 133/49 | HR 62 | Temp 98.1°F | Resp 20 | Ht 66.0 in | Wt 123.0 lb

## 2024-06-27 DIAGNOSIS — Z95828 Presence of other vascular implants and grafts: Secondary | ICD-10-CM

## 2024-06-27 DIAGNOSIS — C8580 Other specified types of non-Hodgkin lymphoma, unspecified site: Secondary | ICD-10-CM

## 2024-06-27 DIAGNOSIS — R1310 Dysphagia, unspecified: Secondary | ICD-10-CM

## 2024-06-27 DIAGNOSIS — R112 Nausea with vomiting, unspecified: Secondary | ICD-10-CM

## 2024-06-27 DIAGNOSIS — K222 Esophageal obstruction: Secondary | ICD-10-CM | POA: Diagnosis not present

## 2024-06-27 DIAGNOSIS — B3781 Candidal esophagitis: Secondary | ICD-10-CM | POA: Diagnosis not present

## 2024-06-27 DIAGNOSIS — Z5112 Encounter for antineoplastic immunotherapy: Secondary | ICD-10-CM | POA: Diagnosis not present

## 2024-06-27 DIAGNOSIS — E871 Hypo-osmolality and hyponatremia: Secondary | ICD-10-CM

## 2024-06-27 DIAGNOSIS — Z5111 Encounter for antineoplastic chemotherapy: Secondary | ICD-10-CM | POA: Diagnosis not present

## 2024-06-27 DIAGNOSIS — R630 Anorexia: Secondary | ICD-10-CM

## 2024-06-27 LAB — CBC WITH DIFFERENTIAL (CANCER CENTER ONLY)
Abs Immature Granulocytes: 3.06 K/uL — ABNORMAL HIGH (ref 0.00–0.07)
Basophils Absolute: 0 K/uL (ref 0.0–0.1)
Basophils Relative: 0 %
Eosinophils Absolute: 0.1 K/uL (ref 0.0–0.5)
Eosinophils Relative: 0 %
HCT: 36.9 % — ABNORMAL LOW (ref 39.0–52.0)
Hemoglobin: 12.5 g/dL — ABNORMAL LOW (ref 13.0–17.0)
Immature Granulocytes: 15 %
Lymphocytes Relative: 7 %
Lymphs Abs: 1.4 K/uL (ref 0.7–4.0)
MCH: 30 pg (ref 26.0–34.0)
MCHC: 33.9 g/dL (ref 30.0–36.0)
MCV: 88.7 fL (ref 80.0–100.0)
Monocytes Absolute: 1.4 K/uL — ABNORMAL HIGH (ref 0.1–1.0)
Monocytes Relative: 7 %
Neutro Abs: 14.8 K/uL — ABNORMAL HIGH (ref 1.7–7.7)
Neutrophils Relative %: 71 %
Platelet Count: 188 K/uL (ref 150–400)
RBC: 4.16 MIL/uL — ABNORMAL LOW (ref 4.22–5.81)
RDW: 12.9 % (ref 11.5–15.5)
Smear Review: NORMAL
WBC Count: 20.8 K/uL — ABNORMAL HIGH (ref 4.0–10.5)
nRBC: 0 % (ref 0.0–0.2)

## 2024-06-27 LAB — CMP (CANCER CENTER ONLY)
ALT: 32 U/L (ref 0–44)
AST: 20 U/L (ref 15–41)
Albumin: 4.3 g/dL (ref 3.5–5.0)
Alkaline Phosphatase: 215 U/L — ABNORMAL HIGH (ref 38–126)
Anion gap: 10 (ref 5–15)
BUN: 9 mg/dL (ref 8–23)
CO2: 24 mmol/L (ref 22–32)
Calcium: 9.1 mg/dL (ref 8.9–10.3)
Chloride: 100 mmol/L (ref 98–111)
Creatinine: 0.91 mg/dL (ref 0.61–1.24)
GFR, Estimated: >60 mL/min (ref >60)
Glucose, Bld: 96 mg/dL (ref 70–99)
Potassium: 4 mmol/L (ref 3.5–5.1)
Sodium: 134 mmol/L — ABNORMAL LOW (ref 135–145)
Total Bilirubin: 0.9 mg/dL (ref 0.0–1.2)
Total Protein: 7.1 g/dL (ref 6.5–8.1)

## 2024-06-27 LAB — MAGNESIUM: Magnesium: 2.4 mg/dL (ref 1.7–2.4)

## 2024-06-27 MED ORDER — SODIUM CHLORIDE 0.9 % IV SOLN
INTRAVENOUS | Status: DC
  Filled 2024-06-27 (×2): qty 250

## 2024-06-27 MED ORDER — FAMOTIDINE IN NACL 20-0.9 MG/50ML-% IV SOLN
20.0000 mg | Freq: Once | INTRAVENOUS | Status: AC
  Administered 2024-06-27: 20 mg via INTRAVENOUS
  Filled 2024-06-27: qty 50

## 2024-06-27 MED ORDER — ONDANSETRON HCL 4 MG/2ML IJ SOLN
8.0000 mg | Freq: Once | INTRAMUSCULAR | Status: AC
  Administered 2024-06-27: 8 mg via INTRAVENOUS
  Filled 2024-06-27: qty 4

## 2024-06-27 MED ORDER — HEPARIN SOD (PORK) LOCK FLUSH 100 UNIT/ML IV SOLN
500.0000 [IU] | Freq: Once | INTRAVENOUS | Status: AC
  Administered 2024-06-27: 500 [IU] via INTRAVENOUS
  Filled 2024-06-27: qty 5

## 2024-06-27 MED ORDER — SODIUM CHLORIDE 0.9% FLUSH
10.0000 mL | Freq: Once | INTRAVENOUS | Status: AC
  Administered 2024-06-27: 10 mL via INTRAVENOUS
  Filled 2024-06-27: qty 10

## 2024-06-27 NOTE — Telephone Encounter (Signed)
 I told that the patient [name] be seen based on Md and it would be in afternoon if needed and she will wait to hear if needed

## 2024-06-27 NOTE — Progress Notes (Addendum)
 Nutrition Follow-up:   Patient with b cell lymphoma.  Patient on R-mini CHOP.   Met with patient during fluids, IV pepcid , IV zofran .  Patient reports that since mid-day yesterday as not been able to eat.  Nothing is going down not even a small amount of water .  Patient spitting up saliva during visit in emesis bag.  Patient points to mid-chest area as to where food feels like it stops.  Unable to take medications.  Reports that he has gotten food stuck in his esophagus in the past but it has resolved.   Medications: reviewed  Labs: reviewed  Anthropometrics:   Weight 123 lb today  137 lb 9.6 oz on 7/3 130 lb on 6/27 UBW of 128 -133 lb since 09/01/2023  NUTRITION DIAGNOSIS: Food and nutrition related knowledge improved.     INTERVENTION:  Spoke with NP and NP has spoken with GI.  NP questioning esophageal stricture.  Planning EGD this week (7/10).  Patient will return for fluids/SMC tomorrow. Encouraged liquids, if able (clear and full liquids).     MONITORING, EVALUATION, GOAL: weight trends, intake   NEXT VISIT: to be determined  Audel Coakley B. Dasie SOLON, CSO, LDN Registered Dietitian [phone]

## 2024-06-27 NOTE — Addendum Note (Signed)
 Addended by: BULA POWELL PARAS on: 06/27/2024 09:33 AM   Modules accepted: Orders

## 2024-06-27 NOTE — Progress Notes (Unsigned)
 Symptom Management Clinic  [facility] Department of the [facility] [place]. [facility] [address] [place], [place] [address] [phone] (phone) [phone] (fax)  Patient Care Team: Sherial Bail, MD as PCP - General (Internal Medicine) Darliss Rogue, MD as PCP - Cardiology (Cardiology) Babara Call, MD as Consulting Physician (Oncology)   Name of the patient: [name]  [id number]  October 23, 1941   Date of visit: 06/27/24  Diagnosis- Large cell lymphoma  Chief complaint/ Reason for visit- Hiccups & reflux  Heme/Onc history:  Oncology History  Lymphoma, large cell (HCC)  04/25/2024 Imaging   ultrasound scrotum with Doppler showed Diffusely heterogeneous right testicular echotexture, most consistent with dominant mass or masses. Multiple left testicular masses. Favor lymphoproliferative process such as lymphoma or leukemia. Bilateral primary testicular neoplasms felt less likely.   Right epididymal enlargement and heterogeneity, favoring epididymitis. The appearance of the testicles is felt unlikely to be be infectious/inflammatory.   04/28/2024 Imaging   CT chest with contrast abdomen pelvis with and without contrast showed 1. Heterogeneous enhancement in both testicles with substantial abnormal enlargement of the right testicle. This is suspicious for testicular neoplasm with lymphoblastic leukemia, lymphoma, or conceivably metastatic disease as top differential diagnostic considerations. Testicular adrenal rest tumors are a differential diagnostic consideration. 2. No overtly pathologic adenopathy identified. 3. Severe parenchymal atrophy of the pancreatic tail and body probably due to a 1.4 cm calcification/stone along the dorsal pancreatic duct at the junction of the body and head. Similar severe atrophy was shown on the CT chest from 01/07/2020. 4. Branching density in the superior segment right  lower lobe favoring bronchiectasis with airway plugging. 5. Small type 1 hiatal hernia. 6. Prominent stool throughout the colon favors constipation. 7. Chronic appearing anterior wedge compressions at L1 and L2. 8. Lumbar spondylosis and degenerative disc disease at L5-S1 resulting in mild bilateral foraminal impingement. 9.  Aortic Atherosclerosis (ICD10-I70.0). 10. Nonspecific lesion peripherally in the right hepatic lobe. This could be further characterized with hepatic protocol MRI with and without contrast.   05/04/2024 Initial Diagnosis   Testicular lymphoma, large cell   He has experienced scrotal swelling since a hydrocele repair in 2022. The swelling decreased slightly post-surgery but never fully resolved. Recently, he received a report indicating concerns in both testicles, prompting further evaluation.    He reports neck soreness and swelling, initially attributed to physical activity such as painting and building a fence. The neck soreness is described as 'almost miserable.'   He mentions unintentional weight loss, noting a decrease from his usual weight of 135-140 pounds to 122 pounds over the past year. Despite efforts to regain weight, including going on cruises, he has not been successful. He currently weighs 133 pounds with clothes on.   No excessive night sweats. He feels cold most of the time but has experienced some fever. No family history of cancer but there is a family history of high cholesterol and heart problems. He has not had a PSA test but underwent a colonoscopy about three years ago.    05/24/2024 Cancer Staging   Staging form: Hodgkin and Non-Hodgkin Lymphoma, AJCC 8th Edition - Clinical stage from 05/24/2024: Stage III (Diffuse large B-cell lymphoma) - Signed by Babara Call, MD on 06/01/2024 Stage prefix: Initial diagnosis   06/16/2024 -  Chemotherapy   Patient is on Treatment Plan : NON-HODGKINS LYMPHOMA R-CHOP q21d       Interval history- [name] is a  83  y.o. male diagnosed with testicular lymphoma, currently s/p cycle 1 of R-Mini-Chop, intrathecal chemotherapy with methotrexate , radiation, on 06/16/24.  Patient says that for a long time he has not been able to to swallow solid of course meat such as steak but in the past week things have been worse.  He is now not able to swallow his saliva and is spitting it up.  Has difficulty with liquids as well.  Denies coughing.  Reports moving his bowel but has noticed is some narrowing of his stool caliber.    Review of systems- Review of Systems  Constitutional:  Positive for weight loss. Negative for chills, fever and malaise/fatigue.  HENT:  Positive for sore throat. Negative for hearing loss, nosebleeds and tinnitus.   Eyes:  Negative for blurred vision and double vision.  Respiratory:  Negative for cough, hemoptysis, shortness of breath and wheezing.   Cardiovascular:  Negative for chest pain, palpitations and leg swelling.  Gastrointestinal:  Negative for abdominal pain, blood in stool, constipation, diarrhea, melena, nausea and vomiting.  Genitourinary:  Negative for dysuria and urgency.  Musculoskeletal:  Negative for back pain, falls, joint pain and myalgias.  Skin:  Negative for itching and rash.  Neurological:  Negative for dizziness, tingling, sensory change, loss of consciousness, weakness and headaches.  Endo/Heme/Allergies:  Negative for environmental allergies. Does not bruise/bleed easily.  Psychiatric/Behavioral:  Negative for depression. The patient is not nervous/anxious and does not have insomnia.     Allergies  Allergen Reactions   Crestor [Rosuvastatin] Other (See Comments)    Severe myalgias   Penicillins Rash   Peanut-Containing Drug Products Other (See Comments)    Wired him up   Atorvastatin Other (See Comments)    ache   Simvastatin Other (See Comments)    aching    Past Medical History:  Diagnosis Date   Arthritis    Bilateral pleural effusion    Bone spur  2010   right shoulder   CAD (coronary artery disease)    a. 12/2019 NSTEMI/Cath: LM 99ost/m, LAD 40, LCX nl, OM1 80, RCA 70ost/p, 99p, 30d, EF 45-50%; b. 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   Complication of anesthesia    a.) delayed emergence; b.) emergence delirium   Diverticulosis    GERD (gastroesophageal reflux disease)    H/O atrial flutter    HFrEF (heart failure with reduced ejection fraction) (HCC)    a.) TEE 01/08/2020: EF 45-50%; b.) TTE 04/14/2022: EF 50-55%, septal HK, G2DD, GLS /18.3%, mild-mod MR, AoV sclerosis without stenosis.   Hyperlipidemia    Hypertension    Hyponatremia    IBS (irritable bowel syndrome)    Ischemic cardiomyopathy    a. 12/2019 TEE: EF 45-50%. Nl RV fxn. Mild MR.   NSTEMI (non-ST elevated myocardial infarction) (HCC) 01/07/2020   a.) LHC 01/08/2020: EF 45-50%, LVEDP 22 mmHg; 99% o-mLM, 80% OM1, 40% mLAD, 70% o-pRCA, 99% pRCA, 30% dRCA --> consult CVTS; b.) 4v CABG 01/07/2022: LIMA-LAD, SVG-RPDA, SVG-OM1, SVG-OM2.   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   Skin cancer    a.) s/p Mohs surgery   Statin intolerance    Testicular mass     Past Surgical History:  Procedure Laterality Date   BACK SURGERY     Lumbar   BUNIONECTOMY  CATARACT EXTRACTION, BILATERAL     COLONOSCOPY     CORONARY ARTERY BYPASS GRAFT N/A 01/08/2020   Procedure: CORONARY ARTERY BYPASS GRAFTING (CABG) x 4  WITH ENDOSCOPIC HARVESTING OF BILATERAL GREATER SAPHENOUS VEINS;  Surgeon: Fleeta Hanford Coy, MD;  Location: [facility] OR;  Service: Open Heart Surgery;  Laterality: N/A;   HEMORRHOID SURGERY  2004   HYDROCELE EXCISION     INGUINAL HERNIA REPAIR  01/2010   IR IMAGING GUIDED PORT INSERTION  06/02/2024   LEFT HEART CATH AND CORONARY ANGIOGRAPHY N/A 01/08/2020    Procedure: LEFT HEART CATH AND CORONARY ANGIOGRAPHY;  Surgeon: Darron Deatrice LABOR, MD;  Location: [facility] INVASIVE CV LAB;  Service: Cardiovascular;  Laterality: N/A;   ORCHIECTOMY Right 05/12/2024   Procedure: ORCHIECTOMY;  Surgeon: Francisca Redell BROCKS, MD;  Location: [facility] ORS;  Service: Urology;  Laterality: Right;   ROTATOR CUFF REPAIR Left 06/2010   left/ bone spur Dr.Blackman   SHOULDER ARTHROSCOPY WITH OPEN ROTATOR CUFF REPAIR Right 05/26/2018   Procedure: SHOULDER ARTHROSCOPY WITH OPEN ROTATOR CUFF REPAIR;  Surgeon: Edie Norleen PARAS, MD;  Location: [facility] ORS;  Service: Orthopedics;  Laterality: Right;  debridement, decompression   SKIN CANCER EXCISION  2003-2005   Mohs-right shoulder   TEE WITHOUT CARDIOVERSION N/A 01/08/2020   Procedure: TRANSESOPHAGEAL ECHOCARDIOGRAM (TEE);  Surgeon: Fleeta Hanford, Coy, MD;  Location: [facility] OR;  Service: Open Heart Surgery;  Laterality: N/A;   TOTAL HIP ARTHROPLASTY Right 07/14/2022   Procedure: TOTAL HIP ARTHROPLASTY ANTERIOR APPROACH;  Surgeon: Kathlynn Sharper, MD;  Location: [facility] ORS;  Service: Orthopedics;  Laterality: Right;    Social History   Socioeconomic History   Marital status: Married    Spouse name: Not on file   Number of children: 2   Years of education: Not on file   Highest education level: Not on file  Occupational History   Occupation: retired Academic librarian: retired  Tobacco Use   Smoking status: Never   Smokeless tobacco: Never  Vaping Use   Vaping status: Never Used  Substance and Sexual Activity   Alcohol use: Not Currently    Alcohol/week: 1.0 standard drink of alcohol    Types: 1 Glasses of wine per week    Comment: very rare   Drug use: No   Sexual activity: Not on file  Other Topics Concern   Not on file  Social History Narrative   2 sons, no contact   Splits his time between [place]  and [place]    Has live-in since 2005   Has living will   Requests Bernice--girlfriend or brother  Lamar, as health care POA   Would accept resuscitation but no prolonged artificial ventilation   Wouldn't want tube feeds if cognitively unaware   Social Drivers of Health   Financial Resource Strain: Low Risk  (04/05/2024)   Received from [facility]   Overall Financial Resource Strain (CARDIA)    Difficulty of Paying Living Expenses: Not hard at all  Food Insecurity: No Food Insecurity (04/05/2024)   [facility]   Hunger Vital Sign    Within the past 12 months, you worried that your food would run out before you got the money to buy more.: Never true    Within the past 12 months, the food you bought just didn't last and you didn't have money to get more.: Never true  Transportation Needs: No Transportation Needs (04/05/2024)   Received from [facility]   PRAPARE - Transportation    In the past 12 months, has lack of transportation kept you from medical appointments or from getting medications?: No    Lack of Transportation (Non-Medical): No  Physical Activity: Not on file  Stress: Not on file  Social Connections: Not on file  Intimate Partner Violence: Not on file    Family History  Problem Relation Age of Onset   Heart disease Mother    Alzheimer's disease Mother    Heart attack Father    Heart disease Brother    Stroke Brother    Cancer Neg Hx    Diabetes Neg Hx    Colon cancer Neg Hx    Esophageal cancer Neg Hx    Stomach cancer Neg Hx    Pancreatic cancer Neg Hx      Current Outpatient Medications:    acetaminophen  (TYLENOL ) 500 MG tablet, Take 500-1,000 mg by mouth every 6 (six) hours as needed for moderate pain (pain score 4-6)., Disp: , Rfl:    acyclovir  (ZOVIRAX ) 400 MG tablet, Take 1 tablet (400 mg total) by mouth daily., Disp: 30 tablet, Rfl: 3   allopurinol  (ZYLOPRIM ) 300 MG tablet, Take 1 tablet (300 mg total) by mouth daily., Disp: 30 tablet, Rfl: 3   amLODipine  (NORVASC ) 5 MG tablet, Take 1  tablet (5 mg total) by mouth daily., Disp: 180 tablet, Rfl: 3   calcium  carbonate (TUMS - DOSED IN MG ELEMENTAL CALCIUM ) 500 MG chewable tablet, Chew 2 tablets (400 mg of elemental calcium  total) by mouth daily., Disp: , Rfl:    docusate sodium  (COLACE) 100 MG capsule, Take 1 capsule (100 mg total) by mouth 2 (two) times daily., Disp: 10 capsule, Rfl: 0   Evolocumab  (REPATHA  SURECLICK) 140 MG/ML SOAJ, Inject 140 mg into the skin every 14 (fourteen) days., Disp: 2 mL, Rfl: 11   lidocaine -prilocaine  (EMLA ) cream, Apply to affected area once, Disp: 30 g, Rfl: 3   metoprolol  succinate (TOPROL -XL) 25 MG 24 hr tablet, Take 0.5 tablets (12.5 mg total) by mouth daily., Disp: 45 tablet, Rfl: 3   nitroGLYCERIN  (NITROSTAT ) 0.4 MG SL tablet, Place 0.4 mg under the tongue every 5 (five) minutes as needed for chest pain., Disp: , Rfl:    omeprazole  (PRILOSEC) 20 MG capsule, Take 1 capsule (20 mg total) by mouth daily., Disp: 30 capsule, Rfl: 1   ondansetron  (ZOFRAN ) 8 MG tablet, Take 1 tablet (8 mg total) by mouth every 8 (eight) hours as needed for nausea or vomiting. Start on the third day after cyclophosphamide  chemotherapy., Disp: 30 tablet, Rfl: 1   predniSONE  (DELTASONE ) 50 MG tablet, Take 2 tablets (100 mg total) by mouth See admin instructions. Take 100mg  daily on Day 2-5 of chemotherapy., Disp: 8 tablet, Rfl: 5   prochlorperazine  (COMPAZINE ) 10 MG tablet, Take 1 tablet (10 mg total) by mouth every 6 (six) hours as needed for nausea or vomiting., Disp: 30 tablet, Rfl: 6   tadalafil  (CIALIS ) 10 MG tablet, Take 1-2 tablets (10-20 mg total) by mouth daily as needed for erectile dysfunction (take 1 hour prior to sexual activity)., Disp: 30 tablet, Rfl: 11   traMADol  (ULTRAM ) 50 MG tablet, Take 1 tablet (50 mg total) by mouth every 6 (six) hours., Disp: 30 tablet, Rfl: 0   omega-3 acid ethyl esters (LOVAZA ) 1 g capsule, Take 1 g by mouth every other day., Disp: , Rfl:   Physical exam:  Vitals:   06/27/24 1345   BP: (!) 133/49  Pulse:  62  Resp: 20  Temp: 98.1 F (36.7 C)  TempSrc: Oral  Weight: 123 lb (55.8 kg)  Height: 5' 6 (1.676 m)   Physical Exam Vitals reviewed.  Constitutional:      Appearance: He is not ill-appearing.  Pulmonary:     Effort: Pulmonary effort is normal. No respiratory distress.  Abdominal:     Comments: Spitting secretions  Skin:    Coloration: Skin is not pale.  Neurological:     Mental Status: He is alert and oriented to person, place, and time.  Psychiatric:        Mood and Affect: Mood normal.        Behavior: Behavior normal.         Latest Ref Rng & Units 06/22/2024    1:06 PM  CMP  Glucose 70 - 99 mg/dL 825   BUN 8 - 23 mg/dL 15   Creatinine 9.38 - 1.24 mg/dL 9.43   Sodium 864 - 854 mmol/L 126   Potassium 3.5 - 5.1 mmol/L 3.8   Chloride 98 - 111 mmol/L 94   CO2 22 - 32 mmol/L 25   Calcium  8.9 - 10.3 mg/dL 8.1   Total Protein 6.5 - 8.1 g/dL 6.3   Total Bilirubin 0.0 - 1.2 mg/dL 1.2   Alkaline Phos 38 - 126 U/L 180   AST 15 - 41 U/L 30   ALT 0 - 44 U/L 67       Latest Ref Rng & Units 06/22/2024    1:06 PM  CBC  WBC 4.0 - 10.5 K/uL 34.0   Hemoglobin 13.0 - 17.0 g/dL 88.8   Hematocrit 60.9 - 52.0 % 32.4   Platelets 150 - 400 K/uL 169    NM Cardiac Muga Rest Result Date: 06/08/2024 CLINICAL DATA:  Testicular lymphoma.  Chemotherapy EXAM: NUCLEAR MEDICINE CARDIAC BLOOD POOL IMAGING (MUGA) TECHNIQUE: Cardiac multi-gated acquisition was performed at rest following intravenous injection of Tc-52m labeled red blood cells. RADIOPHARMACEUTICALS:  22.9 mCi Tc-10m pertechnetate in-vitro labeled red blood cells IV COMPARISON:  None Available. FINDINGS: No  focal wall motion abnormality of the left ventricle. Calculated left ventricular ejection fraction equals 51% IMPRESSION: Left ventricular ejection fraction equals51 %. Electronically Signed   By: Jackquline Boxer M.D.   On: 06/08/2024 15:29   IR IMAGING GUIDED PORT INSERTION Result Date:  06/02/2024 CLINICAL DATA:  B-cell lymphoma of the testicle. EXAM: Chest port catheter placement TECHNIQUE: Procedure performed using fluoroscopy and ultrasound CONTRAST:  None RADIOPHARMACEUTICALS:  None FLUOROSCOPY: 0.4 minutes 1 mGy COMPARISON:  None FINDINGS: The patient was placed in supine position on the IR gantry and the right upper chest and neck were prepped and draped in the usual sterile fashion. The nurse administered intravenous fentanyl  and Versed  under my supervision and the nurse had no other injuries other than monitoring the patient and administering medications. I was present for the entire duration of procedure. 1 mg intravenous Versed  and 50 mcg intravenous fentanyl  for a total sedation time of 18 minutes. Ultrasound guidance was used to investigate the right internal jugular vein which was anechoic and compressible indicating patency. The needle was then advanced from a scan negative through the soft tissue into the right internal jugular vein under ultrasound guidance. A final image was obtained and stored in the patient's permanent medical record. Access was then exchanged over a guidewire which was advanced under fluoroscopic guidance. The needle was removed and replaced with a micropuncture sheath. Approximately 2 inches below the clavicle the port  pocket was created with a subsequent incision. The catheter was then tunneled from the port pocket to the venotomy site overlying the right internal jugular vein. Access was then exchanged over an 035 guidewire for peel-away sheath which was advanced over the guidewire under fluoroscopic guidance. The catheter was then advanced through the peel-away sheath to the sinoatrial junction. Sheath was removed. The catheter was then cut at the port pocket and connected to chest port. The chest port was tested for function and finally function well. The chest port was then flushed with heparin  and a port pocket was closed with 4-0 suture. Final image was  obtained demonstrating satisfactory position of chest port. The final count of all materials was satisfactory. IMPRESSION: 1. Satisfactory placement of right internal jugular vein single-lumen chest port. The catheter tip is at the sinoatrial junction. 2.  Okay to use and power inject chest port. Electronically Signed   By: Cordella Banner   On: 06/02/2024 10:59   NM PET Image Initial (PI) Skull Base To Thigh Result Date: 05/31/2024 CLINICAL DATA:  Initial treatment strategy for testicular lymphoma. EXAM: NUCLEAR MEDICINE PET SKULL BASE TO THIGH TECHNIQUE: 7.23 mCi F-18 FDG was injected intravenously. Full-ring PET imaging was performed from the skull base to thigh after the radiotracer. CT data was obtained and used for attenuation correction and anatomic localization. Fasting blood glucose: 97 mg/dl COMPARISON:  CT chest abdomen pelvis 04/28/2024. Scrotal ultrasound 04/25/2024 FINDINGS: Mediastinal blood pool activity: SUV max 2.0 Liver activity: SUV max 2.5 NECK: No specific abnormal uptake seen above blood pool in the neck including along lymph node change of the submandibular, posterior triangle or internal jugular region. Near symmetric uptake of the visualized intracranial compartment. Incidental CT findings: The parotid glands, submandibular glands are unremarkable. Thyroid  glands preserved. Scattered vascular calcifications. There is some streak artifact related to the patient's dental hardware. Visualized paranasal sinuses and mastoid air cells are clear. CHEST: On the prior chest CT scan there is some small bilateral hilar lymph nodes. Some the show uptake. Example left hilar lesion has maximum SUV value of 4.9 and correspond to a node on the prior contrast CT scan measuring 8 mm in short axis. This is seen today on image 61. Small right-sided hilar nodes also seen such as image 64 with uptake of 3.5 maximum SUV. No abnormal uptake otherwise along nodal chains in the axillary regions. Previous  described prominent paratracheal nodes have low-level uptake. Example the node described right paratracheal the common near the carina and is again seen image 57 today and similar in size but this particular node does not have significant uptake with maximum SUV of 2.3. Small focus of uptake in the right lung maximum SUV value of 1.6 corresponding to a small nodular area in the superior segment of the right lower lobe on image 57. This is branching MeV bronchial in origin. Otherwise no significant abnormal uptake along the lung parenchyma. Incidental CT findings: Breathing motion. No pleural effusion or consolidation. Status post median sternotomy. Heart is nonenlarged. No pericardial effusion. Small hiatal hernia. The thoracic aorta is normal course and caliber. ABDOMEN/PELVIS: There is physiologic distribution radiotracer along the parenchymal organs, bowel and renal collecting systems. Only minimal nonspecific bilateral adrenal uptake. Specifically the spleen is nonenlarged. No abnormal splenic activity. There are nodular areas uptake along the scrotum towards the testicles consistent with known history. The subtle confluent area on the right has maximum SUV of 12.4. The left side there are several adjacent clustered nodular areas of  uptake maximum SUV value dislocation of 9.2. Is presence of scrotal hydrocele as well as nodular tissue on CT. Edema is seen tracking along the inguinal canal region on the right greater than left. Surgical changes along the anterior right side of the perineum with mesh. Please correlate for prior hernia repair or other process. No specific abnormal uptake along lymph node chains of the abdomen and pelvis. Exception is some uptake within a small right femoral node maximum SUV 3.0 and node on image 136 just medial to the common femoral vein measures 11 mm in short axis. This areas partially obscured by the streak artifact from the patient's right hip arthroplasty. The there is some  mild uptake within right inguinal node maximum SUV of only 1.8. Incidental CT findings: Enlarged prostate with mass effect along the bladder. Wall thickening of the bladder. Diffuse vascular calcifications identified including aorta and branch vessels. Atrophic pancreas. Gallbladder is present. Small cystic foci along the liver are incompletely evaluated on this examination but not hypermetabolic. Please correlate prior contrast CT. No renal or ureteral stones. Bowel is nondilated. Scattered colonic stool. No stomach dilatation. Small bowel is nondilated. No free air or free fluid. SKELETON: No abnormal uptake identified along the visualized osseous structures. 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 Incidental CT findings: Diffuse degenerative changes identified. Mild wedge deformity of L1-L2. IMPRESSION: Lobular appearance to the scrotum with soft tissue as seen on the prior CT scan as well as edema. Scrotal nodularity is hypermetabolic consistent with known neoplasm. Bilateral small hilar lymph nodes are mildly hypermetabolic. These are indeterminate. Area of neoplastic spread is technically in the differential but there the other options as well based on the distribution. Short-term follow up CT Schimpf be of some benefit. Previously seen prominent mediastinal nodes are without abnormal uptake. Small right femoral and inguinal nodes with minimal uptake. Low-level uptake along the branching nodular focus in the superior segment right lower lobe seen on prior CT. Again this could be mucous plugging or infectious or inflammatory. Recommend attention on follow-up CT scan. Small cystic foci in the liver are not hypermetabolic. Please correlate with prior CT recommendations. Electronically Signed   By: Ranell Bring M.D.   On: 05/31/2024 14:07   Assessment and plan- Patient is a 83 y.o. male diagnosed with large cell lymphoma who presents to symptom management clinic for:   Difficulty  swallowing- chronic dysphagia and long history of intermittent dysphagia with transient food impactions. Now, unable to manage secretions. Started on omeprazole  earlier this month which hasn't improved symptoms. I suspect esophageal stricture based on clinical presentation. Vigeant also have component of yeast. I reached out to GI, Dr Stacia who agrees to perform endoscopy on 7/10. REviewed instructions for preparation. We will plan for supportive care in interim.  Large cell Lymphoma- s/p cycle 1 R- Mini CHOP with D4 GCSF. Plan is to add intrathecal methotrexate  with cycle 2.   Disposition:  Fluids tomorrow Patient to have endoscopy Thursday in [place] - move brain MRI F/u with Dr Babara next week & J[place]- [place]   Visit Diagnosis 1. Esophageal stricture   2. Esophageal candidiasis (HCC)    Patient expressed understanding and was in agreement with this plan. He also understands that He can call clinic at any time with any questions, concerns, or complaints.   Thank you for allowing me to participate in the care of this very pleasant patient.   Tinnie Dawn, DNP, AGNP-C, AOCNP [facility] [phone]

## 2024-06-27 NOTE — Patient Instructions (Signed)
 You have been scheduled for an endoscopy with Dr Stacia on 7/10 at [address], [address] Floor, [place]. Their number is [phone]. You need to arrive on 7/10 (Thursday) at 10:15am for an 11:45am appt. NOTHING by mouth including food, water , medications after midnight on Wednesday. If you have questions, contact our clinic at [phone].  It was a pleasure meeting you today and thank you for allowing me to participate in your care. -Tinnie Dawn, NP

## 2024-06-27 NOTE — Patient Instructions (Signed)
 Famotidine  Injection What is this medication? FAMOTIDINE  (fa MOE ti deen) treats stomach ulcers, reflux disease, or other conditions that cause too much stomach acid. It works by reducing the amount of acid in the stomach. This medicine Searson be used for other purposes; ask your health care provider or pharmacist if you have questions. COMMON BRAND NAME(S): Pepcid  What should I tell my care team before I take this medication? They need to know if you have any of these conditions: Kidney or liver disease An unusual or allergic reaction to famotidine , other medications, foods, dyes, or preservatives Pregnant or trying to get pregnant Breast-feeding How should I use this medication? This medication is for infusion into a vein. It is given in a hospital or clinic setting. Talk to your care team regarding the use of this medication in children. Special care Mah be needed. Overdosage: If you think you have taken too much of this medicine contact a poison control center or emergency room at once. NOTE: This medicine is only for you. Do not share this medicine with others. What if I miss a dose? This does not apply. What Latterell interact with this medication? Delavirdine Itraconazole Ketoconazole This list Jordan not describe all possible interactions. Give your health care provider a list of all the medicines, herbs, non-prescription drugs, or dietary supplements you use. Also tell them if you smoke, drink alcohol, or use illegal drugs. Some items Ankney interact with your medicine. What should I watch for while using this medication? Visit your care team for regular checks on your progress. Tell your care team if your symptoms do not start to get better or if they get worse. Avoid taking medications that contain aspirin , acetaminophen , ibuprofen, naproxen, or ketoprofen unless instructed by your care team. These can make your condition worse. Tobacco and alcohol Chaney irritate your stomach. This Camille increase  the time it takes for ulcers to heal. If you get black, tarry stools or vomit up what looks like coffee grounds, call your doctor or care team at once. You Lamoureaux have a bleeding ulcer. This medication Aaron cause a decrease in vitamin B12. You should make sure that you get enough vitamin B12 while you are taking this medication. Discuss the foods you eat and the vitamins you take with your care team. What side effects Manter I notice from receiving this medication? Side effects that you should report to your care team as soon as possible: Allergic reactions--skin rash, itching, hives, swelling of the face, lips, tongue, or throat Confusion Hallucinations Side effects that usually do not require medical attention (report to your care team if they continue or are bothersome): Constipation Diarrhea Dizziness Headache This list Gassner not describe all possible side effects. Call your doctor for medical advice about side effects. You Mckinstry report side effects to FDA at 1-800-FDA-1088. Where should I keep my medication? This medication is given in a hospital or clinic. You will not be given this medication to store at home. NOTE: This sheet is a summary. It Winnett not cover all possible information. If you have questions about this medicine, talk to your doctor, pharmacist, or health care provider.  2024 Elsevier/Gold Standard (2023-04-29 00:00:00)

## 2024-06-27 NOTE — Telephone Encounter (Signed)
 The wife has called stating that he has hiccups and then there is phone coming out of his mouth.  It has been happening on and off.  He can put the food in and then sometimes he starts coughing it up especially meat.  He is really not getting nourishment with this going on.  The wife says that he had tried to do chicken yesterday and today he has coughed it out per wife

## 2024-06-27 NOTE — Telephone Encounter (Signed)
 Dr. Wilhelmenia Harada recommends for pt to be evaluated by [facility]

## 2024-06-28 ENCOUNTER — Ambulatory Visit

## 2024-06-28 VITALS — BP 121/60 | HR 58 | Temp 97.6°F | Resp 18

## 2024-06-28 DIAGNOSIS — Z5111 Encounter for antineoplastic chemotherapy: Secondary | ICD-10-CM | POA: Diagnosis not present

## 2024-06-28 DIAGNOSIS — R112 Nausea with vomiting, unspecified: Secondary | ICD-10-CM

## 2024-06-28 DIAGNOSIS — C8580 Other specified types of non-Hodgkin lymphoma, unspecified site: Secondary | ICD-10-CM

## 2024-06-28 DIAGNOSIS — Z5112 Encounter for antineoplastic immunotherapy: Secondary | ICD-10-CM | POA: Diagnosis not present

## 2024-06-28 MED ORDER — HEPARIN SOD (PORK) LOCK FLUSH 100 UNIT/ML IV SOLN
500.0000 [IU] | Freq: Once | INTRAVENOUS | Status: AC
  Administered 2024-06-28: 500 [IU] via INTRAVENOUS
  Filled 2024-06-28: qty 5

## 2024-06-28 MED ORDER — FAMOTIDINE IN NACL 20-0.9 MG/50ML-% IV SOLN
20.0000 mg | Freq: Once | INTRAVENOUS | Status: AC
  Administered 2024-06-28: 20 mg via INTRAVENOUS
  Filled 2024-06-28: qty 50

## 2024-06-28 MED ORDER — ONDANSETRON HCL 4 MG/2ML IJ SOLN
8.0000 mg | Freq: Once | INTRAMUSCULAR | Status: AC
  Administered 2024-06-28: 8 mg via INTRAVENOUS
  Filled 2024-06-28: qty 4

## 2024-06-28 MED ORDER — SODIUM CHLORIDE 0.9 % IV SOLN
INTRAVENOUS | Status: DC
  Filled 2024-06-28 (×2): qty 250

## 2024-06-28 NOTE — Progress Notes (Signed)
 Patient requested to see Lauren, Np today during iv fluids. I inquired about his questions. Pt stated that he wanted to also have a colonoscopy tomorrow when he gets his EGD performed.  I explained to him that the priority was the egd. Pt stated that he said he was able to get cream of wheat down this am. He is able to eat/swallowing much better since the iv fluids yesterday. I explained to him that even if they could do a colonoscopy 1- he would not be able to have the cream of wheat. He was informed that GI would give him specific instructions for a gi protocol bowel prep for the colonoscopy. He voiced that he understands and no longer wanted to see a provider today.

## 2024-06-29 ENCOUNTER — Ambulatory Visit

## 2024-06-29 ENCOUNTER — Encounter (HOSPITAL_COMMUNITY)
Admission: RE | Disposition: A | Discharge status: Home / Self Care | Payer: Self-pay | Source: Home / Self Care | Attending: Gastroenterology

## 2024-06-29 ENCOUNTER — Ambulatory Visit (HOSPITAL_COMMUNITY): Admitting: Anesthesiology

## 2024-06-29 ENCOUNTER — Ambulatory Visit (HOSPITAL_COMMUNITY)
Admission: RE | Admit: 2024-06-29 | Discharge: 2024-06-29 | Disposition: A | Discharge status: Home / Self Care | Attending: Gastroenterology | Admitting: Gastroenterology

## 2024-06-29 ENCOUNTER — Encounter (HOSPITAL_COMMUNITY): Payer: Self-pay | Admitting: Gastroenterology

## 2024-06-29 DIAGNOSIS — R1310 Dysphagia, unspecified: Secondary | ICD-10-CM

## 2024-06-29 DIAGNOSIS — I2510 Atherosclerotic heart disease of native coronary artery without angina pectoris: Secondary | ICD-10-CM | POA: Diagnosis not present

## 2024-06-29 DIAGNOSIS — I5022 Chronic systolic (congestive) heart failure: Secondary | ICD-10-CM | POA: Insufficient documentation

## 2024-06-29 DIAGNOSIS — I252 Old myocardial infarction: Secondary | ICD-10-CM | POA: Diagnosis not present

## 2024-06-29 DIAGNOSIS — Z951 Presence of aortocoronary bypass graft: Secondary | ICD-10-CM | POA: Diagnosis not present

## 2024-06-29 DIAGNOSIS — B3781 Candidal esophagitis: Secondary | ICD-10-CM | POA: Insufficient documentation

## 2024-06-29 DIAGNOSIS — K222 Esophageal obstruction: Secondary | ICD-10-CM | POA: Diagnosis not present

## 2024-06-29 DIAGNOSIS — K2100 Gastro-esophageal reflux disease with esophagitis, without bleeding: Secondary | ICD-10-CM | POA: Insufficient documentation

## 2024-06-29 DIAGNOSIS — C8580 Other specified types of non-Hodgkin lymphoma, unspecified site: Secondary | ICD-10-CM

## 2024-06-29 DIAGNOSIS — I10 Essential (primary) hypertension: Secondary | ICD-10-CM

## 2024-06-29 DIAGNOSIS — I110 Hypertensive heart disease with heart failure: Secondary | ICD-10-CM | POA: Diagnosis not present

## 2024-06-29 HISTORY — PX: ESOPHAGOGASTRODUODENOSCOPY: SHX5428

## 2024-06-29 HISTORY — PX: BALLOON DILATION: SHX5330

## 2024-06-29 SURGERY — EGD (ESOPHAGOGASTRODUODENOSCOPY)
Anesthesia: Monitor Anesthesia Care

## 2024-06-29 MED ORDER — EPHEDRINE SULFATE-NACL 50-0.9 MG/10ML-% IV SOSY
PREFILLED_SYRINGE | INTRAVENOUS | Status: DC | PRN
Start: 2024-06-29 — End: 2024-06-29
  Administered 2024-06-29: 10 mg via INTRAVENOUS

## 2024-06-29 MED ORDER — OMEPRAZOLE 20 MG PO CPDR: 20.0000 mg | DELAYED_RELEASE_CAPSULE | Freq: Two times a day (BID) | ORAL | 0 refills | Status: DC

## 2024-06-29 MED ORDER — PROPOFOL 10 MG/ML IV BOLUS
INTRAVENOUS | Status: DC | PRN
  Administered 2024-06-29: 30 mg via INTRAVENOUS
  Administered 2024-06-29 (×2): 50 mg via INTRAVENOUS

## 2024-06-29 MED ORDER — LIDOCAINE HCL (CARDIAC) PF 100 MG/5ML IV SOSY
PREFILLED_SYRINGE | INTRAVENOUS | Status: DC | PRN
  Administered 2024-06-29: 60 mg via INTRATRACHEAL

## 2024-06-29 MED ORDER — SODIUM CHLORIDE 0.9 % IV SOLN: INTRAVENOUS | Status: DC

## 2024-06-29 NOTE — Discharge Instructions (Signed)

## 2024-06-29 NOTE — Addendum Note (Signed)
 Addendum  created 06/29/24 1300 by Viviana Almarie DASEN, CRNA   Flowsheet accepted

## 2024-06-29 NOTE — H&P (Signed)
 [facility] History and Physical   Primary Care Physician:  Sherial Bail, MD   Reason for Procedure:   Dysphagia  Plan:    EGD     HPI: [name] is a 83 y.o. male undergoing EGD to evaluate chronic dysphagia.  He reports a long history of intermittent dysphagia and transient food impactions.  Two days ago, he was eating lunch and felt food get stuck and he was unable to tolerate his saliva for many hours.  He was eventually able to bring up the stuck food.  He ate a normal dinner yesterday. No history of GERD symptoms.  He was started on omeprazole  earlier this month.  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  Past Medical History:  Diagnosis Date   Arthritis    Bilateral pleural effusion    Bone spur 2010   right shoulder   CAD (coronary artery disease)    a. 12/2019 NSTEMI/Cath: LM 99ost/m, LAD 40, LCX nl, OM1 80, RCA 70ost/p, 99p, 30d, EF 45-50%; b. 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   Complication of anesthesia    a.) delayed emergence; b.) emergence delirium   Diverticulosis    GERD (gastroesophageal reflux disease)    H/O atrial flutter    HFrEF (heart failure with reduced ejection fraction) (HCC)    a.) TEE 01/08/2020: EF 45-50%; b.) TTE 04/14/2022: EF 50-55%, septal HK, G2DD, GLS /18.3%, mild-mod MR, AoV sclerosis without stenosis.   Hyperlipidemia    Hypertension    Hyponatremia    IBS (irritable bowel syndrome)    Ischemic cardiomyopathy    a. 12/2019 TEE: EF 45-50%. Nl RV fxn. Mild MR.   NSTEMI (non-ST elevated myocardial infarction) (HCC) 01/07/2020   a.) LHC 01/08/2020: EF 45-50%, LVEDP 22 mmHg; 99% o-mLM, 80% OM1, 40% mLAD, 70% o-pRCA, 99% pRCA, 30% dRCA --> consult CVTS; b.) 4v CABG 01/07/2022: LIMA-LAD, SVG-RPDA, SVG-OM1, SVG-OM2.   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   Skin cancer    a.) s/p Mohs surgery   Statin intolerance    Testicular mass     Past Surgical History:  Procedure Laterality Date   BACK SURGERY     Lumbar   BUNIONECTOMY     CATARACT EXTRACTION, BILATERAL     COLONOSCOPY     CORONARY ARTERY BYPASS GRAFT N/A 01/08/2020   Procedure: CORONARY ARTERY BYPASS GRAFTING (CABG) x 4  WITH ENDOSCOPIC HARVESTING OF BILATERAL GREATER SAPHENOUS VEINS;  Surgeon: Fleeta Hanford Coy, MD;  Location: [facility] OR;  Service: Open Heart Surgery;  Laterality: N/A;   HEMORRHOID SURGERY  2004   HYDROCELE EXCISION     INGUINAL HERNIA REPAIR  01/2010   IR IMAGING GUIDED PORT INSERTION  06/02/2024   LEFT HEART CATH AND CORONARY ANGIOGRAPHY N/A 01/08/2020   Procedure: LEFT HEART CATH AND CORONARY ANGIOGRAPHY;  Surgeon: Darron Deatrice LABOR, MD;  Location: [facility] INVASIVE CV LAB;  Service: Cardiovascular;  Laterality: N/A;   ORCHIECTOMY Right 05/12/2024   Procedure: ORCHIECTOMY;  Surgeon: Francisca Redell BROCKS, MD;  Location: [facility] ORS;  Service: Urology;  Laterality: Right;   ROTATOR CUFF REPAIR Left 06/2010   left/ bone spur Dr.Blackman   SHOULDER ARTHROSCOPY WITH OPEN ROTATOR CUFF REPAIR Right 05/26/2018   Procedure: SHOULDER ARTHROSCOPY WITH  OPEN ROTATOR CUFF REPAIR;  Surgeon: Edie Norleen PARAS, MD;  Location: [facility] ORS;  Service: Orthopedics;  Laterality: Right;  debridement, decompression   SKIN CANCER EXCISION  2003-2005   Mohs-right shoulder   TEE WITHOUT CARDIOVERSION N/A 01/08/2020   Procedure: TRANSESOPHAGEAL ECHOCARDIOGRAM (TEE);  Surgeon: Fleeta Ochoa, Maude, MD;  Location: [facility] OR;  Service: Open Heart Surgery;  Laterality: N/A;   TOTAL HIP ARTHROPLASTY Right 07/14/2022   Procedure: TOTAL HIP ARTHROPLASTY ANTERIOR APPROACH;  Surgeon: Kathlynn Sharper, MD;  Location: [facility] ORS;  Service: Orthopedics;  Laterality: Right;    Prior to Admission medications   Medication Sig Start Date  End Date Taking? Authorizing Provider  acetaminophen  (TYLENOL ) 500 MG tablet Take 500-1,000 mg by mouth every 6 (six) hours as needed for moderate pain (pain score 4-6).   Yes [provider]  calcium  carbonate (TUMS - DOSED IN MG ELEMENTAL CALCIUM ) 500 MG chewable tablet Chew 2 tablets (400 mg of elemental calcium  total) by mouth daily. 06/22/24  Yes Babara Call, MD  docusate sodium  (COLACE) 100 MG capsule Take 1 capsule (100 mg total) by mouth 2 (two) times daily. 07/15/22  Yes Charlene Debby BROCKS, PA-C  Evolocumab  (REPATHA  SURECLICK) 140 MG/ML SOAJ Inject 140 mg into the skin every 14 (fourteen) days. 09/01/23  Yes Hammock, Tylene, NP  lidocaine -prilocaine  (EMLA ) cream Apply to affected area once 06/01/24  Yes Babara Call, MD  metoprolol  succinate (TOPROL -XL) 25 MG 24 hr tablet Take 0.5 tablets (12.5 mg total) by mouth daily. 09/01/23  Yes Hammock, Sheri, NP  omeprazole  (PRILOSEC) 20 MG capsule Take 1 capsule (20 mg total) by mouth daily. 06/20/24  Yes Babara Call, MD  ondansetron  (ZOFRAN ) 8 MG tablet Take 1 tablet (8 mg total) by mouth every 8 (eight) hours as needed for nausea or vomiting. Start on the third day after cyclophosphamide  chemotherapy. 06/01/24  Yes Babara Call, MD  predniSONE  (DELTASONE ) 50 MG tablet Take 2 tablets (100 mg total) by mouth See admin instructions. Take 100mg  daily on Day 2-5 of chemotherapy. 06/16/24  Yes Babara Call, MD  prochlorperazine  (COMPAZINE ) 10 MG tablet Take 1 tablet (10 mg total) by mouth every 6 (six) hours as needed for nausea or vomiting. 06/01/24  Yes Babara Call, MD  acyclovir  (ZOVIRAX ) 400 MG tablet Take 1 tablet (400 mg total) by mouth daily. 06/01/24   Babara Call, MD  allopurinol  (ZYLOPRIM ) 300 MG tablet Take 1 tablet (300 mg total) by mouth daily. 06/01/24   Babara Call, MD  amLODipine  (NORVASC ) 5 MG tablet Take 1 tablet (5 mg total) by mouth daily. 09/10/23 06/27/24  Darliss Rogue, MD  nitroGLYCERIN  (NITROSTAT ) 0.4 MG SL tablet Place 0.4 mg under the tongue every 5 (five)  minutes as needed for chest pain.    [provider]  omega-3 acid ethyl esters (LOVAZA ) 1 g capsule Take 1 g by mouth every other day.    [provider]  tadalafil  (CIALIS ) 10 MG tablet Take 1-2 tablets (10-20 mg total) by mouth daily as needed for erectile dysfunction (take 1 hour prior to sexual activity). 04/13/24   Francisca Rogue BROCKS, MD  traMADol  (ULTRAM ) 50 MG tablet Take 1 tablet (50 mg total) by mouth every 6 (six) hours. 07/15/22   Charlene Debby BROCKS, PA-C    Current Facility-Administered Medications  Medication Dose Route Frequency Provider Last Rate Last Admin   0.9 %  sodium chloride  infusion   Intravenous Continuous Stacia Glendia BRAVO, MD        Allergies as of 06/27/2024 -  Review Complete 06/27/2024  Allergen Reaction Noted   Crestor [rosuvastatin] Other (See Comments) 05/21/2015   Penicillins Rash 03/01/2007   Peanut-containing drug products Other (See Comments) 01/08/2020   Atorvastatin Other (See Comments) 03/01/2007   Simvastatin Other (See Comments) 03/01/2007    Family History  Problem Relation Age of Onset   Heart disease Mother    Alzheimer's disease Mother    Heart attack Father    Heart disease Brother    Stroke Brother    Cancer Neg Hx    Diabetes Neg Hx    Colon cancer Neg Hx    Esophageal cancer Neg Hx    Stomach cancer Neg Hx    Pancreatic cancer Neg Hx     Social History   Socioeconomic History   Marital status: Married    Spouse name: Not on file   Number of children: 2   Years of education: Not on file   Highest education level: Not on file  Occupational History   Occupation: retired Academic librarian: retired  Tobacco Use   Smoking status: Never   Smokeless tobacco: Never  Vaping Use   Vaping status: Never Used  Substance and Sexual Activity   Alcohol use: Not Currently    Alcohol/week: 1.0 standard drink of alcohol    Types: 1 Glasses of wine per week    Comment: very rare   Drug use: No    Sexual activity: Not on file  Other Topics Concern   Not on file  Social History Narrative   2 sons, no contact   Splits his time between [place]  and [place]    Has live-in since 2005   Has living will   Requests Bernice--girlfriend or brother Lamar, as health care POA   Would accept resuscitation but no prolonged artificial ventilation   Wouldn't want tube feeds if cognitively unaware   Social Drivers of Health   Financial Resource Strain: Low Risk  (04/05/2024)   Received from [facility]   Overall Financial Resource Strain (CARDIA)    Difficulty of Paying Living Expenses: Not hard at all  Food Insecurity: No Food Insecurity (04/05/2024)   Received from [facility]   Hunger Vital Sign    Within the past 12 months, you worried that your food would run out before you got the money to buy more.: Never true    Within the past 12 months, the food you bought just didn't last and you didn't have money to get more.: Never true  Transportation Needs: No Transportation Needs (04/05/2024)   Received from [facility] - Transportation    In the past 12 months, has lack of transportation kept you from medical appointments or from getting medications?: No    Lack of Transportation (Non-Medical): No  Physical Activity: Not on file  Stress: Not on file  Social Connections: Not on file  Intimate Partner Violence: Not on file    Review of Systems:  All other review of systems negative except as mentioned in the HPI.  Physical Exam: Vital signs BP (!) 165/56   Pulse 60   Temp (!) 96.7 F (35.9 C) (Tympanic)   Resp 12   Ht 5' 7 (1.702 m)   Wt 54.4 kg   SpO2 99%   BMI 18.79 kg/m   General:   Alert,  Well-developed, well-nourished, pleasant and cooperative in NAD Airway:  Mallampati 2 Lungs:  Clear throughout to  auscultation.   Heart:  Regular rate and rhythm; no murmurs, clicks, rubs,  or gallops. Abdomen:   Soft, nontender and nondistended. Normal bowel sounds.   Neuro/Psych:  Normal mood and affect. A and O x 3   Giabella Duhart E. Stacia, MD [facility]

## 2024-06-29 NOTE — Anesthesia Preprocedure Evaluation (Addendum)
 Anesthesia Evaluation  Patient identified by MRN, date of birth, ID band Patient awake    Reviewed: Allergy & Precautions, H&P , NPO status , Patient's Chart, lab work & pertinent test results  Airway Mallampati: II  TM Distance: >3 FB Neck ROM: Full    Dental no notable dental hx. (+) Teeth Intact, Dental Advisory Given   Pulmonary neg pulmonary ROS   Pulmonary exam normal breath sounds clear to auscultation       Cardiovascular hypertension, Pt. on medications and Pt. on home beta blockers + CAD, + Past MI and + CABG   Rhythm:Regular Rate:Normal     Neuro/Psych negative neurological ROS  negative psych ROS   GI/Hepatic Neg liver ROS,GERD  Medicated,,  Endo/Other  negative endocrine ROS    Renal/GU negative Renal ROS  negative genitourinary   Musculoskeletal  (+) Arthritis , Osteoarthritis,    Abdominal   Peds  Hematology negative hematology ROS (+)   Anesthesia Other Findings   Reproductive/Obstetrics negative OB ROS                              Anesthesia Physical Anesthesia Plan  ASA: 3  Anesthesia Plan: MAC   Post-op Pain Management: Minimal or no pain anticipated   Induction: Intravenous  PONV Risk Score and Plan: 1 and Propofol  infusion  Airway Management Planned: Natural Airway and Simple Face Mask  Additional Equipment:   Intra-op Plan:   Post-operative Plan:   Informed Consent: I have reviewed the patients History and Physical, chart, labs and discussed the procedure including the risks, benefits and alternatives for the proposed anesthesia with the patient or authorized representative who has indicated his/her understanding and acceptance.     Dental advisory given  Plan Discussed with: CRNA  Anesthesia Plan Comments:          Anesthesia Quick Evaluation

## 2024-06-29 NOTE — Op Note (Signed)
 [facility] Patient Name: [name] Procedure Date : 06/29/2024 MRN: [id number] Attending MD: Glendia BRAVO. Stacia , MD, [id number] Date of Birth: 07/28/1941 CSN: [id number] Age: 83 Admit Type: Outpatient Procedure:                Upper GI endoscopy Indications:              Dysphagia Providers:                Glendia E. Stacia, MD, Ritta Debbie Alert,                            RN, [place], Technician, Almarie DEL, CRNA Referring MD:              Medicines:                Monitored Anesthesia Care Complications:            No immediate complications. Estimated Blood Loss:     Estimated blood loss was minimal. Procedure:                Pre-Anesthesia Assessment:                           - Prior to the procedure, a History and Physical                            was performed, and patient medications and                            allergies were reviewed. The patient's tolerance of                            previous anesthesia was also reviewed. The risks                            and benefits of the procedure and the sedation                            options and risks were discussed with the patient.                            All questions were answered, and informed consent                            was obtained. Prior Anticoagulants: The patient has                            taken no anticoagulant or antiplatelet agents. ASA                            Grade Assessment: III - A patient with severe                            systemic disease. After reviewing the risks and  benefits, the patient was deemed in satisfactory                            condition to undergo the procedure.                           After obtaining informed consent, the endoscope was                            passed under direct vision. Throughout the                            procedure, the patient's blood pressure, pulse, and                             oxygen saturations were monitored continuously. The                            [id number]-H190 ([id number]) Olympus endoscope was introduced                            through the mouth, and advanced to the second part                            of duodenum. The upper GI endoscopy was                            accomplished without difficulty. The patient                            tolerated the procedure well. Scope In: Scope Out: Findings:      The examined portions of the nasopharynx, oropharynx and larynx were       normal.      One benign-appearing, intrinsic mild stenosis was found in the distal       esophagus. This stenosis measured 1.1 cm (inner diameter) x less than       one cm (in length). The stenosis was traversed. A TTS dilator was passed       through the scope. Dilation with a 10-01-11 mm balloon dilator was       performed to 12 mm. The dilation site was examined and showed mild       mucosal disruption. Estimated blood loss was minimal.      One superficial esophageal ulcer with stigmata of recent bleeding was       found at the gastroesophageal junction. The lesion seemed to involve       about 75% of the circumference of the GEJ. Biopsies were taken with a       cold forceps for histology. Estimated blood loss was minimal.      Diffuse mucosal changes characterized by white specks were found in the       entire esophagus. Although initially concerning for candidiasis, the       whitish exudate mostly rinsed completely with lavage. Biopsies were       taken with a cold forceps for histology. Estimated blood loss was       minimal.  The entire examined stomach was normal.      The examined duodenum was normal. Impression:               - The examined portions of the nasopharynx,                            oropharynx and larynx were normal.                           - Benign-appearing esophageal stenosis. Dilated.                           - Esophageal ulcer.  Suspect this Arambula have been                            related to patient's recent transient food                            impaction. Biopsied.                           - Whitish specked mucosa in the esophagus. Although                            initially concerning for candidiasis, the whitish                            exudate mostly rinsed completely with lavage,                            making candidiasis less likely. Biopsied.                           - Normal stomach.                           - Normal examined duodenum. Moderate Sedation:      N/A Recommendation:           - Patient has a contact number available for                            emergencies. The signs and symptoms of potential                            delayed complications were discussed with the                            patient. Return to normal activities tomorrow.                            Written discharge instructions were provided to the                            patient.                           -  Resume previous diet.                           - Use Prilosec (omeprazole ) 20 mg PO BID for 6                            weeks, then once daily indefinitely.                           - Can repeat EGD with dilation as needed for                            ongoing dysphagia symptoms. Procedure Code(s):        --- Professional ---                           [phone], Esophagogastroduodenoscopy, flexible,                            transoral; with transendoscopic balloon dilation of                            esophagus (less than 30 mm diameter) Diagnosis Code(s):        --- Professional ---                           K22.2, Esophageal obstruction                           K22.11, Ulcer of esophagus with bleeding                           K22.89, Other specified disease of esophagus                           R13.10, Dysphagia, unspecified CPT copyright 2022 American Medical Association. All rights  reserved. The codes documented in this report are preliminary and upon coder review Marken  be revised to meet current compliance requirements. Pocahontas Cohenour E. Stacia, MD 06/29/2024 12:00:22 PM This report has been signed electronically. Number of Addenda: 0

## 2024-06-29 NOTE — Transfer of Care (Signed)
 Immediate Anesthesia Transfer of Care Note  Patient: [name]  Procedure(s) Performed: EGD (ESOPHAGOGASTRODUODENOSCOPY) BALLOON DILATION  Patient Location: Endoscopy Unit  Anesthesia Type:MAC  Level of Consciousness: awake, alert , oriented, and patient cooperative  Airway & Oxygen Therapy: Patient Spontanous Breathing  Post-op Assessment: Report given to RN, Post -op Vital signs reviewed and stable, Patient moving all extremities X 4, and Patient able to stick tongue midline  Post vital signs: Reviewed and stable  Last Vitals:  Vitals Value Taken Time  BP 106/49 06/29/24 11:51  Temp 36.3 C 06/29/24 11:51  Pulse 64 06/29/24 11:52  Resp 18 06/29/24 11:52  SpO2 100 % 06/29/24 11:52  Vitals shown include unfiled device data.  Last Pain:  Vitals:   06/29/24 1151  TempSrc: Tympanic  PainSc: Asleep         Complications: No notable events documented.

## 2024-06-29 NOTE — Anesthesia Postprocedure Evaluation (Signed)
 Anesthesia Post Note  Patient: [name]  Procedure(s) Performed: EGD (ESOPHAGOGASTRODUODENOSCOPY) BALLOON DILATION     Patient location during evaluation: Endoscopy Anesthesia Type: MAC Level of consciousness: awake and alert Pain management: pain level controlled Vital Signs Assessment: post-procedure vital signs reviewed and stable Respiratory status: spontaneous breathing, nonlabored ventilation and respiratory function stable Cardiovascular status: stable and blood pressure returned to baseline Postop Assessment: no apparent nausea or vomiting Anesthetic complications: no   No notable events documented.  Last Vitals:  Vitals:   06/29/24 1201 06/29/24 1211  BP: (!) 126/49 (!) 139/59  Pulse: 63 62  Resp: 20 19  Temp:    SpO2: 98% 100%    Last Pain:  Vitals:   06/29/24 1211  TempSrc:   PainSc: 0-No pain                 Reno Clasby,W. EDMOND

## 2024-06-30 ENCOUNTER — Ambulatory Visit
Admission: RE | Admit: 2024-06-30 | Discharge: 2024-06-30 | Disposition: A | Discharge status: Home / Self Care | Source: Ambulatory Visit | Attending: Oncology

## 2024-06-30 ENCOUNTER — Telehealth: Payer: Self-pay

## 2024-06-30 DIAGNOSIS — C8580 Other specified types of non-Hodgkin lymphoma, unspecified site: Secondary | ICD-10-CM | POA: Diagnosis present

## 2024-06-30 LAB — SURGICAL PATHOLOGY

## 2024-06-30 MED ORDER — GADOBUTROL 1 MMOL/ML IV SOLN
5.0000 mL | Freq: Once | INTRAVENOUS | Status: AC | PRN
  Administered 2024-06-30: 5 mL via INTRAVENOUS

## 2024-06-30 NOTE — Telephone Encounter (Signed)
 Spoke with pt and Claudia to follow up on swallowing issues. He reports doing better. Will go ahead and cancel appt with MD on 7/15 and Keep nutrition appt since he is better. They are aware of plan. Informed them to keep appt on 7/21 and if any concerns arise to let us  know sooner. They verbalized understanding.

## 2024-07-03 ENCOUNTER — Ambulatory Visit: Payer: Self-pay | Admitting: Gastroenterology

## 2024-07-03 MED ORDER — FLUCONAZOLE 200 MG PO TABS: 200.0000 mg | ORAL_TABLET | Freq: Every day | ORAL | 0 refills | Status: AC

## 2024-07-03 NOTE — Progress Notes (Signed)
 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  This is a common fungus, but usually only causes infections in patients with impaired immune systems or patients who are taking medications that affect their immune systems.   This is likely contributing to your swallowing difficulties.  We need to treat the infection with an antifungal agent called diflucan .  Please take once a day for three weeks.    If you continue to have difficulty swallowing after this, please contact our office.  Team,  Please place a prescription for Diflucan  200 mg PO daily for 21 days #[id number]

## 2024-07-04 ENCOUNTER — Inpatient Hospital Stay

## 2024-07-04 ENCOUNTER — Ambulatory Visit: Admitting: Oncology

## 2024-07-04 ENCOUNTER — Encounter: Payer: Self-pay | Admitting: Oncology

## 2024-07-04 NOTE — Progress Notes (Signed)
 Nutrition Follow-up:  Patient with b cell lymphoma.  Patient on R-mini CHOP.  S/p EGD on 7/10.    Met with patient and wife in clinic.  Reports that his appetite is good and foods are going down better.  Noted dilation performed during EGD of esophagus.  Today has been able to eat split pea soup for lunch with 1/2 tomato sandwich and 1/2 of egg salad sandwich. Also had peaches on yellow cake with ice cream for dessert.  Breakfast was egg with cheese toast with jelly, link sausage fruit and coffee.  Has been drinking 30 g protein shake mixed with soy/almond milk.  Also drinking gatorade.      Medications: diflucan , prilosec  Labs: reviewed  Anthropometrics:   Weight 129 lb 5 oz today in clinic, increased   NUTRITION DIAGNOSIS: Inadequate oral intake   INTERVENTION:  Discussed ways to increase calories and protein.   Encouraged chewing foods well.  Adding gravy, sauces, etc to make foods moist. Patient has contact information     MONITORING, EVALUATION, GOAL: weight trends, intake   NEXT VISIT: to be determined with treatment  Kate Larock B. Dasie SOLON, CSO, LDN Registered Dietitian [phone]

## 2024-07-10 ENCOUNTER — Other Ambulatory Visit: Payer: Self-pay

## 2024-07-10 ENCOUNTER — Encounter: Payer: Self-pay | Admitting: Oncology

## 2024-07-10 ENCOUNTER — Inpatient Hospital Stay

## 2024-07-10 ENCOUNTER — Inpatient Hospital Stay (HOSPITAL_BASED_OUTPATIENT_CLINIC_OR_DEPARTMENT_OTHER): Admitting: Oncology

## 2024-07-10 VITALS — BP 121/49 | HR 61 | Temp 97.4°F | Resp 16 | Wt 132.0 lb

## 2024-07-10 VITALS — BP 115/52 | HR 63

## 2024-07-10 DIAGNOSIS — E871 Hypo-osmolality and hyponatremia: Secondary | ICD-10-CM | POA: Diagnosis not present

## 2024-07-10 DIAGNOSIS — C8580 Other specified types of non-Hodgkin lymphoma, unspecified site: Secondary | ICD-10-CM

## 2024-07-10 DIAGNOSIS — Z5111 Encounter for antineoplastic chemotherapy: Secondary | ICD-10-CM | POA: Diagnosis not present

## 2024-07-10 DIAGNOSIS — B3781 Candidal esophagitis: Secondary | ICD-10-CM | POA: Insufficient documentation

## 2024-07-10 DIAGNOSIS — Z5112 Encounter for antineoplastic immunotherapy: Secondary | ICD-10-CM | POA: Diagnosis not present

## 2024-07-10 LAB — CBC WITH DIFFERENTIAL (CANCER CENTER ONLY)
Abs Immature Granulocytes: 1.65 K/uL — ABNORMAL HIGH (ref 0.00–0.07)
Basophils Absolute: 0.1 K/uL (ref 0.0–0.1)
Basophils Relative: 1 %
Eosinophils Absolute: 0.2 K/uL (ref 0.0–0.5)
Eosinophils Relative: 2 %
HCT: 34.2 % — ABNORMAL LOW (ref 39.0–52.0)
Hemoglobin: 11.6 g/dL — ABNORMAL LOW (ref 13.0–17.0)
Immature Granulocytes: 14 %
Lymphocytes Relative: 14 %
Lymphs Abs: 1.8 K/uL (ref 0.7–4.0)
MCH: 29.9 pg (ref 26.0–34.0)
MCHC: 33.9 g/dL (ref 30.0–36.0)
MCV: 88.1 fL (ref 80.0–100.0)
Monocytes Absolute: 1.4 K/uL — ABNORMAL HIGH (ref 0.1–1.0)
Monocytes Relative: 11 %
Neutro Abs: 7.2 K/uL (ref 1.7–7.7)
Neutrophils Relative %: 58 %
Platelet Count: 302 K/uL (ref 150–400)
RBC: 3.88 MIL/uL — ABNORMAL LOW (ref 4.22–5.81)
RDW: 13.4 % (ref 11.5–15.5)
Smear Review: NORMAL
WBC Count: 12.2 K/uL — ABNORMAL HIGH (ref 4.0–10.5)
nRBC: 0 % (ref 0.0–0.2)

## 2024-07-10 LAB — CMP (CANCER CENTER ONLY)
ALT: 23 U/L (ref 0–44)
AST: 26 U/L (ref 15–41)
Albumin: 4.1 g/dL (ref 3.5–5.0)
Alkaline Phosphatase: 171 U/L — ABNORMAL HIGH (ref 38–126)
Anion gap: 8 (ref 5–15)
BUN: 16 mg/dL (ref 8–23)
CO2: 23 mmol/L (ref 22–32)
Calcium: 8.9 mg/dL (ref 8.9–10.3)
Chloride: 100 mmol/L (ref 98–111)
Creatinine: 0.73 mg/dL (ref 0.61–1.24)
GFR, Estimated: >60 mL/min (ref >60)
Glucose, Bld: 139 mg/dL — ABNORMAL HIGH (ref 70–99)
Potassium: 4.1 mmol/L (ref 3.5–5.1)
Sodium: 131 mmol/L — ABNORMAL LOW (ref 135–145)
Total Bilirubin: 0.6 mg/dL (ref 0.0–1.2)
Total Protein: 6.9 g/dL (ref 6.5–8.1)

## 2024-07-10 LAB — OSMOLALITY: Osmolality: 279 mosm/kg (ref 275–295)

## 2024-07-10 LAB — OSMOLALITY, URINE: Osmolality, Ur: 248 mosm/kg — ABNORMAL LOW (ref 300–900)

## 2024-07-10 MED ORDER — SODIUM CHLORIDE (PF) 0.9 % IJ SOLN
Freq: Once | INTRAMUSCULAR | Status: DC
  Filled 2024-07-10: qty 0.48

## 2024-07-10 MED ORDER — STERILE WATER FOR INJECTION IJ SOLN
5.0000 mL | Freq: Four times a day (QID) | OROMUCOSAL | 1 refills | Status: AC | PRN
  Filled 2024-07-10: qty 480, 12d supply, fill #0

## 2024-07-10 MED ORDER — ACETAMINOPHEN 325 MG PO TABS
650.0000 mg | ORAL_TABLET | Freq: Once | ORAL | Status: AC
  Administered 2024-07-10: 650 mg via ORAL
  Filled 2024-07-10: qty 2

## 2024-07-10 MED ORDER — PREDNISONE 50 MG PO TABS: 100.0000 mg | ORAL_TABLET | ORAL | Status: DC

## 2024-07-10 MED ORDER — SODIUM CHLORIDE 0.9 % IV SOLN
375.0000 mg/m2 | Freq: Once | INTRAVENOUS | Status: AC
  Administered 2024-07-10: 600 mg via INTRAVENOUS
  Filled 2024-07-10: qty 10

## 2024-07-10 MED ORDER — DOXORUBICIN HCL CHEMO IV INJECTION 2 MG/ML
25.0000 mg/m2 | Freq: Once | INTRAVENOUS | Status: AC
  Administered 2024-07-10: 42 mg via INTRAVENOUS
  Filled 2024-07-10: qty 21

## 2024-07-10 MED ORDER — DEXAMETHASONE SODIUM PHOSPHATE 10 MG/ML IJ SOLN
10.0000 mg | Freq: Once | INTRAMUSCULAR | Status: AC
  Administered 2024-07-10: 10 mg via INTRAVENOUS
  Filled 2024-07-10: qty 1

## 2024-07-10 MED ORDER — HEPARIN SOD (PORK) LOCK FLUSH 100 UNIT/ML IV SOLN
500.0000 [IU] | Freq: Once | INTRAVENOUS | Status: DC | PRN
  Filled 2024-07-10: qty 5

## 2024-07-10 MED ORDER — DIPHENHYDRAMINE HCL 25 MG PO CAPS
50.0000 mg | ORAL_CAPSULE | Freq: Once | ORAL | Status: AC
Start: 2024-07-10 — End: 2024-07-10
  Administered 2024-07-10: 50 mg via ORAL
  Filled 2024-07-10: qty 2

## 2024-07-10 MED ORDER — APREPITANT 130 MG/18ML IV EMUL
130.0000 mg | Freq: Once | INTRAVENOUS | Status: AC
  Administered 2024-07-10: 130 mg via INTRAVENOUS
  Filled 2024-07-10: qty 18

## 2024-07-10 MED ORDER — PALONOSETRON HCL INJECTION 0.25 MG/5ML
0.2500 mg | Freq: Once | INTRAVENOUS | Status: AC
  Administered 2024-07-10: 0.25 mg via INTRAVENOUS
  Filled 2024-07-10: qty 5

## 2024-07-10 MED ORDER — VINCRISTINE SULFATE CHEMO INJECTION 1 MG/ML
1.0000 mg | Freq: Once | INTRAVENOUS | Status: AC
  Administered 2024-07-10: 1 mg via INTRAVENOUS
  Filled 2024-07-10: qty 1

## 2024-07-10 MED ORDER — SODIUM CHLORIDE 0.9 % IV SOLN
INTRAVENOUS | Status: DC
  Filled 2024-07-10: qty 250

## 2024-07-10 MED ORDER — SODIUM CHLORIDE 0.9 % IV SOLN
400.0000 mg/m2 | Freq: Once | INTRAVENOUS | Status: AC
  Administered 2024-07-10: 660 mg via INTRAVENOUS
  Filled 2024-07-10: qty 33

## 2024-07-10 NOTE — Assessment & Plan Note (Signed)
 Recommend patient to take calcium 1200mg  daily. Take vitamin D supplementation.

## 2024-07-10 NOTE — Progress Notes (Signed)
 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  Ki-67 70% Right orchiectomy pathology results were reviewed and discussed with patient. Recommend R Mini CHOP, intrathecal chemotherapy with methotrexate , radiation.  Prechemo MUGA showed LVEF 51%.  I have asked pathology to add FISH testing for Bcl-2/BCL6/c-Myc.  Labs are reviewed and discussed with patient. Proceed with cycle 2 R - Mini CHOP.  D3 GCSF - recommend claritin 10mg  daily for 4 days. He gets Dexamethasone  on D1 and he takes prednisone  100mg  D2-5.  Plan to add intrathecal methotrexate  on D2. Rationale and side effects were reviewed with patient.     Encounter for antineoplastic chemotherapy Chemotherapy as listed above.   Hypocalcemia Recommend patient to take calcium  1200 mg daily.  Take vitamin D supplementation.   Hyponatremia Intermittent chronic issue for him.  He is asymptomatic.  Patient [place] liberate salt intake Check urine osmolarity, serum osmolarity, urine sodium.  Esophageal candidiasis (HCC) [place] course of Fluconadzole   Orders Placed This Encounter  Procedures   CBC with Differential (Cancer Center Only)    Standing Status:   Future    Expected Date:   07/31/2024    Expiration Date:   07/31/2025   CMP (Cancer Center only)    Standing Status:   Future    Expected Date:   07/31/2024    Expiration Date:   07/31/2025   Follow up 2 weeks All questions were answered. The patient knows to call the clinic with any problems, questions or concerns.  Call Babara, MD, PhD [facility] Hematology Oncology 07/10/2024    HISTORY OF PRESENTING ILLNESS:  Dequante Tremaine Haberle 83 y.o. male presents to establish care for testicular lymphoma.   Oncology History  Lymphoma, large cell (HCC)  04/25/2024 Imaging   ultrasound scrotum with Doppler showed Diffusely heterogeneous right testicular echotexture, most consistent with dominant mass or masses. Multiple left testicular masses. Favor lymphoproliferative process such as lymphoma or leukemia. Bilateral primary testicular neoplasms felt less likely.   Right epididymal enlargement and heterogeneity, favoring epididymitis. The appearance of the testicles is felt unlikely to be be infectious/inflammatory.   04/28/2024 Imaging   CT chest with contrast abdomen pelvis with and without contrast showed 1. Heterogeneous enhancement in both testicles with substantial abnormal enlargement of the right testicle. This is suspicious for testicular neoplasm with lymphoblastic leukemia, lymphoma, or conceivably metastatic disease as top differential diagnostic considerations. Testicular adrenal rest tumors are a differential diagnostic consideration. 2. No overtly pathologic adenopathy identified. 3. Severe parenchymal atrophy of the pancreatic tail and body probably due to a 1.4 cm calcification/stone along the dorsal pancreatic duct at the junction of the body and head. Similar severe atrophy was shown on the CT chest from 01/07/2020. 4. Branching density in the superior segment right lower lobe favoring bronchiectasis with airway plugging. 5. Small type 1 hiatal hernia. 6. Prominent stool throughout the colon favors constipation. 7. Chronic appearing anterior wedge compressions at L1 and L2. 8. Lumbar spondylosis and degenerative disc disease at L5-S1 resulting in mild bilateral foraminal impingement. 9.  Aortic Atherosclerosis (ICD10-I70.0). 10. Nonspecific lesion peripherally in  the right hepatic lobe. This could  be further characterized with hepatic protocol MRI with and without contrast.   05/04/2024 Initial Diagnosis   Testicular lymphoma, large cell   He has experienced scrotal swelling since a hydrocele repair in 2022. The swelling decreased slightly post-surgery but never fully resolved. Recently, he received a report indicating concerns in both testicles, prompting further evaluation.    He reports neck soreness and swelling, initially attributed to physical activity such as painting and building a fence. The neck soreness is described as 'almost miserable.'   He mentions unintentional weight loss, noting a decrease from his usual weight of 135-140 pounds to 122 pounds over the past year. Despite efforts to regain weight, including going on cruises, he has not been successful. He currently weighs 133 pounds with clothes on.   No excessive night sweats. He feels cold most of the time but has experienced some fever. No family history of cancer but there is a family history of high cholesterol and heart problems. He has not had a PSA test but underwent a colonoscopy about three years ago.    05/24/2024 Cancer Staging   Staging form: Hodgkin and Non-Hodgkin Lymphoma, AJCC 8th Edition - Clinical stage from 05/24/2024: Stage III (Diffuse large B-cell lymphoma) - Signed by Babara Call, MD on 06/01/2024 Stage prefix: Initial diagnosis   06/16/2024 -  Chemotherapy   Patient is on Treatment Plan : NON-HODGKINS LYMPHOMA R-CHOP q21d      Today patient presents to discuss results. Patient reports chronic right shoulder pain and left neck pain. S/p EGD, stretch of stenosis, on Fluconazole  for esophageal candidiasis Swallowing difficulty has much improved. Oral intake has improved.      MEDICAL HISTORY:  Past Medical History:  Diagnosis Date   Arthritis    Bilateral pleural effusion    Bone spur 2010   right shoulder   CAD (coronary artery disease)    a. 12/2019 NSTEMI/Cath: LM 99ost/m, LAD 40, LCX nl,  OM1 80, RCA 70ost/p, 99p, 30d, EF 45-50%; b. 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   Complication of anesthesia    a.) delayed emergence; b.) emergence delirium   Diverticulosis    GERD (gastroesophageal reflux disease)    H/O atrial flutter    HFrEF (heart failure with reduced ejection fraction) (HCC)    a.) TEE 01/08/2020: EF 45-50%; b.) TTE 04/14/2022: EF 50-55%, septal HK, G2DD, GLS /18.3%, mild-mod MR, AoV sclerosis without stenosis.   Hyperlipidemia    Hypertension    Hyponatremia    IBS (irritable bowel syndrome)    Ischemic cardiomyopathy    a. 12/2019 TEE: EF 45-50%. Nl RV fxn. Mild MR.   NSTEMI (non-ST elevated myocardial infarction) (HCC) 01/07/2020   a.) LHC 01/08/2020: EF 45-50%, LVEDP 22 mmHg; 99% o-mLM, 80% OM1, 40% mLAD, 70% o-pRCA, 99% pRCA, 30% dRCA --> consult CVTS; b.) 4v CABG 01/07/2022: LIMA-LAD, SVG-RPDA, SVG-OM1, SVG-OM2.   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   Skin cancer    a.) s/p Mohs surgery   Statin intolerance    Testicular mass     SURGICAL HISTORY: Past Surgical History:  Procedure Laterality Date   BACK SURGERY     Lumbar   BALLOON DILATION N/A 06/29/2024   Procedure: BALLOON DILATION;  Surgeon: Stacia Hamilton  E, MD;  Location: [facility] ENDOSCOPY;  Service: Gastroenterology;  Laterality: N/A;   BUNIONECTOMY     CATARACT EXTRACTION, BILATERAL     COLONOSCOPY     CORONARY ARTERY BYPASS GRAFT N/A 01/08/2020   Procedure: CORONARY ARTERY BYPASS GRAFTING (CABG) x 4  WITH ENDOSCOPIC HARVESTING OF BILATERAL GREATER SAPHENOUS VEINS;  Surgeon: Fleeta Hanford Coy, MD;  Location: [facility] OR;  Service: Open Heart Surgery;  Laterality: N/A;   ESOPHAGOGASTRODUODENOSCOPY N/A 06/29/2024   Procedure: EGD (ESOPHAGOGASTRODUODENOSCOPY);  Surgeon: Stacia Glendia BRAVO, MD;  Location: [facility] ENDOSCOPY;  Service: Gastroenterology;  Laterality: N/A;   HEMORRHOID SURGERY  2004   HYDROCELE EXCISION     INGUINAL HERNIA REPAIR  01/2010   IR IMAGING GUIDED PORT INSERTION  06/02/2024   LEFT HEART CATH AND CORONARY ANGIOGRAPHY N/A 01/08/2020   Procedure: LEFT HEART CATH AND CORONARY ANGIOGRAPHY;  Surgeon: Darron Deatrice LABOR, MD;  Location: [facility] INVASIVE CV LAB;  Service: Cardiovascular;  Laterality: N/A;   ORCHIECTOMY Right 05/12/2024   Procedure: ORCHIECTOMY;  Surgeon: Francisca Redell BROCKS, MD;  Location: [facility] ORS;  Service: Urology;  Laterality: Right;   ROTATOR CUFF REPAIR Left 06/2010   left/ bone spur Dr.Blackman   SHOULDER ARTHROSCOPY WITH OPEN ROTATOR CUFF REPAIR Right 05/26/2018   Procedure: SHOULDER ARTHROSCOPY WITH OPEN ROTATOR CUFF REPAIR;  Surgeon: Edie Norleen PARAS, MD;  Location: [facility] ORS;  Service: Orthopedics;  Laterality: Right;  debridement, decompression   SKIN CANCER EXCISION  2003-2005   Mohs-right shoulder   TEE WITHOUT CARDIOVERSION N/A 01/08/2020   Procedure: TRANSESOPHAGEAL ECHOCARDIOGRAM (TEE);  Surgeon: Fleeta Hanford, Coy, MD;  Location: [facility] OR;  Service: Open Heart Surgery;  Laterality: N/A;   TOTAL HIP ARTHROPLASTY Right 07/14/2022   Procedure: TOTAL HIP ARTHROPLASTY ANTERIOR APPROACH;  Surgeon: Kathlynn Sharper, MD;  Location: [facility] ORS;  Service: Orthopedics;  Laterality: Right;    SOCIAL HISTORY: Social History   Socioeconomic History   Marital status: Married    Spouse name: Not on file   Number of children: 2   Years of education: Not on file   Highest education level: Not on file  Occupational History   Occupation: retired Academic librarian: retired  Tobacco Use   Smoking status: Never   Smokeless tobacco: Never  Vaping Use   Vaping status: Never Used  Substance and Sexual Activity   Alcohol use: Not Currently    Alcohol/week: 1.0 standard drink of alcohol    Types: 1 Glasses of wine per week     Comment: very rare   Drug use: No   Sexual activity: Not on file  Other Topics Concern   Not on file  Social History Narrative   2 sons, no contact   Splits his time between [place]  and [place]    Has live-in since 2005   Has living will   Requests Bernice--girlfriend or brother Lamar, as health care POA   Would accept resuscitation but no prolonged artificial ventilation   Wouldn't want tube feeds if cognitively unaware   Social Drivers of Health   Financial Resource Strain: Low Risk  (04/05/2024)   Received from [facility]   Overall Financial Resource Strain (CARDIA)    Difficulty of Paying Living Expenses: Not hard at all  Food Insecurity: No Food Insecurity (04/05/2024)   Received from [facility]   Hunger Vital Sign    Within the past 12 months, you worried that your food would run out before you  got the money to buy more.: Never true    Within the past 12 months, the food you bought just didn't last and you didn't have money to get more.: Never true  Transportation Needs: No Transportation Needs (04/05/2024)   Received from [facility] - Transportation    In the past 12 months, has lack of transportation kept you from medical appointments or from getting medications?: No    Lack of Transportation (Non-Medical): No  Physical Activity: Not on file  Stress: Not on file  Social Connections: Not on file  Intimate Partner Violence: Not on file    FAMILY HISTORY: Family History  Problem Relation Age of Onset   Heart disease Mother    Alzheimer's disease Mother    Heart attack Father    Heart disease Brother    Stroke Brother    Cancer Neg Hx    Diabetes Neg Hx    Colon cancer Neg Hx    Esophageal cancer Neg Hx    Stomach cancer Neg Hx    Pancreatic cancer Neg Hx     ALLERGIES:  is allergic to crestor [rosuvastatin], penicillins, peanut-containing drug products, atorvastatin, and  simvastatin.  MEDICATIONS:  Current Outpatient Medications  Medication Sig Dispense Refill   acetaminophen  (TYLENOL ) 500 MG tablet Take 500-1,000 mg by mouth every 6 (six) hours as needed for moderate pain (pain score 4-6).     acyclovir  (ZOVIRAX ) 400 MG tablet Take 1 tablet (400 mg total) by mouth daily. 30 tablet 3   allopurinol  (ZYLOPRIM ) 300 MG tablet Take 1 tablet (300 mg total) by mouth daily. 30 tablet 3   amLODipine  (NORVASC ) 5 MG tablet Take 1 tablet (5 mg total) by mouth daily. 180 tablet 3   calcium  carbonate (TUMS - DOSED IN MG ELEMENTAL CALCIUM ) 500 MG chewable tablet Chew 2 tablets (400 mg of elemental calcium  total) by mouth daily.     docusate sodium  (COLACE) 100 MG capsule Take 1 capsule (100 mg total) by mouth 2 (two) times daily. 10 capsule 0   Evolocumab  (REPATHA  SURECLICK) 140 MG/ML SOAJ Inject 140 mg into the skin every 14 (fourteen) days. 2 mL 11   fluconazole  (DIFLUCAN ) 200 MG tablet Take 1 tablet (200 mg total) by mouth daily for 21 days. 21 tablet 0   lidocaine -prilocaine  (EMLA ) cream Apply to affected area once 30 g 3   magic mouthwash (multi-ingredient) oral suspension Swish and spit 5-10 mLs 4 (four) times daily as needed for mouth pain. 480 mL 1   metoprolol  succinate (TOPROL -XL) 25 MG 24 hr tablet Take 0.5 tablets (12.5 mg total) by mouth daily. 45 tablet 3   nitroGLYCERIN  (NITROSTAT ) 0.4 MG SL tablet Place 0.4 mg under the tongue every 5 (five) minutes as needed for chest pain.     omeprazole  (PRILOSEC) 20 MG capsule Take 1 capsule (20 mg total) by mouth 2 (two) times daily before a meal. 120 capsule 0   ondansetron  (ZOFRAN ) 8 MG tablet Take 1 tablet (8 mg total) by mouth every 8 (eight) hours as needed for nausea or vomiting. Start on the third day after cyclophosphamide  chemotherapy. 30 tablet 1   prochlorperazine  (COMPAZINE ) 10 MG tablet Take 1 tablet (10 mg total) by mouth every 6 (six) hours as needed for nausea or vomiting. 30 tablet 6   tadalafil  (CIALIS ) 10  MG tablet Take 1-2 tablets (10-20 mg total) by mouth daily as needed for erectile dysfunction (take 1 hour prior to sexual activity). 30 tablet  11   traMADol  (ULTRAM ) 50 MG tablet Take 1 tablet (50 mg total) by mouth every 6 (six) hours. 30 tablet 0   omega-3 acid ethyl esters (LOVAZA ) 1 g capsule Take 1 g by mouth every other day.     predniSONE  (DELTASONE ) 50 MG tablet Take 2 tablets (100 mg total) by mouth See admin instructions. Take 100mg  daily on Day 2-5 of chemotherapy.     No current facility-administered medications for this visit.   Facility-Administered Medications Ordered in Other Visits  Medication Dose Route Frequency Provider Last Rate Last Admin   0.9 %  sodium chloride  infusion   Intravenous Continuous Babara Call, MD 10 mL/hr at 07/10/24 0904 New Bag at 07/10/24 0904   cyclophosphamide  (CYTOXAN ) 660 mg in sodium chloride  0.9 % 250 mL chemo infusion  400 mg/m2 (Treatment Plan Recorded) Intravenous Once Babara Call, MD       DOXOrubicin  (ADRIAMYCIN ) chemo injection 42 mg  25 mg/m2 (Treatment Plan Recorded) Intravenous Once Babara Call, MD       heparin  lock flush 100 unit/mL  500 Units Intracatheter Once PRN Mackay Hanauer, MD       riTUXimab -pvvr (RUXIENCE ) 600 mg in sodium chloride  0.9 % 250 mL (1.9355 mg/mL) infusion  375 mg/m2 (Treatment Plan Recorded) Intravenous Once Babara Call, MD       vinCRIStine  (ONCOVIN ) 1 mg in sodium chloride  0.9 % 50 mL chemo infusion  1 mg Intravenous Once Babara Call, MD        Review of Systems  Constitutional:  Positive for fatigue. Negative for appetite change, chills, fever and unexpected weight change.  HENT:   Negative for hearing loss and voice change.   Eyes:  Negative for eye problems and icterus.  Respiratory:  Negative for chest tightness, cough and shortness of breath.   Cardiovascular:  Negative for chest pain and leg swelling.  Gastrointestinal:  Negative for abdominal distention and abdominal pain.  Endocrine: Negative for hot flashes.   Genitourinary:  Negative for difficulty urinating, dysuria and frequency.        Status post right orchiectomy  Musculoskeletal:  Positive for arthralgias and neck pain.  Skin:  Negative for itching and rash.  Neurological:  Negative for light-headedness and numbness.  Hematological:  Negative for adenopathy. Does not bruise/bleed easily.  Psychiatric/Behavioral:  Negative for confusion.      PHYSICAL EXAMINATION: ECOG PERFORMANCE STATUS: 1 - Symptomatic but completely ambulatory  Vitals:   07/10/24 0826  BP: (!) 121/49  Pulse: 61  Resp: 16  Temp: (!) 97.4 F (36.3 C)  SpO2: 98%   Filed Weights   07/10/24 0826  Weight: 132 lb (59.9 kg)    Physical Exam Constitutional:      General: He is not in acute distress.    Appearance: He is not diaphoretic.  HENT:     Head: Normocephalic and atraumatic.  Eyes:     General: No scleral icterus. Cardiovascular:     Rate and Rhythm: Normal rate and regular rhythm.     Heart sounds: No murmur heard. Pulmonary:     Effort: Pulmonary effort is normal. No respiratory distress.     Breath sounds: No wheezing.  Abdominal:     General: There is no distension.     Palpations: Abdomen is soft.     Tenderness: There is no abdominal tenderness.  Musculoskeletal:        General: Normal range of motion.     Cervical back: Normal range of motion and neck supple.  Skin:    General: Skin is warm and dry.     Findings: No erythema.  Neurological:     Mental Status: He is alert and oriented to person, place, and time. Mental status is at baseline.     Motor: No abnormal muscle tone.  Psychiatric:        Mood and Affect: Affect normal.     LABORATORY DATA:  I have reviewed the data as listed    Latest Ref Rng & Units 07/10/2024    7:57 AM 06/27/2024    1:40 PM 06/22/2024    1:06 PM  CBC  WBC 4.0 - 10.5 K/uL 12.2  20.8  34.0   Hemoglobin 13.0 - 17.0 g/dL 88.3  87.4  88.8   Hematocrit 39.0 - 52.0 % 34.2  36.9  32.4   Platelets 150 -  400 K/uL 302  188  169       Latest Ref Rng & Units 07/10/2024    7:57 AM 06/27/2024    1:40 PM 06/22/2024    1:06 PM  CMP  Glucose 70 - 99 mg/dL 860  96  825   BUN 8 - 23 mg/dL 16  9  15    Creatinine 0.61 - 1.24 mg/dL 9.26  9.08  9.43   Sodium 135 - 145 mmol/L 131  134  126   Potassium 3.5 - 5.1 mmol/L 4.1  4.0  3.8   Chloride 98 - 111 mmol/L 100  100  94   CO2 22 - 32 mmol/L 23  24  25    Calcium  8.9 - 10.3 mg/dL 8.9  9.1  8.1   Total Protein 6.5 - 8.1 g/dL 6.9  7.1  6.3   Total Bilirubin 0.0 - 1.2 mg/dL 0.6  0.9  1.2   Alkaline Phos 38 - 126 U/L 171  215  180   AST 15 - 41 U/L 26  20  30    ALT 0 - 44 U/L 23  32  67      RADIOGRAPHIC STUDIES: I have personally reviewed the radiological images as listed and agreed with the findings in the report. MR Brain W Wo Contrast Result Date: 07/02/2024 CLINICAL DATA:  New diagnosis of lymphoma.  Staging. EXAM: MRI HEAD WITHOUT AND WITH CONTRAST TECHNIQUE: Multiplanar, multiecho pulse sequences of the brain and surrounding structures were obtained without and with intravenous contrast. CONTRAST:  5mL GADAVIST  GADOBUTROL  1 MMOL/ML IV SOLN COMPARISON:  None Available. FINDINGS: Brain: Diffusion imaging is normal. There are mild chronic small-vessel ischemic changes of the pons. No focal cerebellar insult. Cerebral hemispheres show minimal small vessel change of the white matter, less than often seen at this age. No cortical or large vessel territory stroke. No mass, hemorrhage, hydrocephalus or extra-axial collection. No abnormal brain or leptomeningeal enhancement. Vascular: Major vessels at the base of the brain show flow. Skull and upper cervical spine: Negative Sinuses/Orbits: Clear/normal Other: None IMPRESSION: No evidence of metastatic disease. Mild chronic small-vessel ischemic change of the pons and cerebral hemispheric white matter, less than often seen at this age. Electronically Signed   By: Oneil Officer M.D.   On: 07/02/2024 16:46

## 2024-07-10 NOTE — Patient Instructions (Signed)
 [facility] - A DEPT OF [facility]  Discharge Instructions: Thank you for choosing [facility] to provide your oncology and hematology care.  If you have a lab appointment with the Cancer Center, please go directly to the Cancer Center and check in at the registration area.  Wear comfortable clothing and clothing appropriate for easy access to any Portacath or PICC line.   We strive to give you quality time with your provider. You Heenan need to reschedule your appointment if you arrive late (15 or more minutes).  Arriving late affects you and other patients whose appointments are after yours.  Also, if you miss three or more appointments without notifying the office, you Cromartie be dismissed from the clinic at the provider's discretion.      For prescription refill requests, have your pharmacy contact our office and allow 72 hours for refills to be completed.    Today you received the following chemotherapy and/or immunotherapy agents Adriamycin , Oncovin , Cytoxan  & Ruxience       To help prevent nausea and vomiting after your treatment, we encourage you to take your nausea medication as directed.  BELOW ARE SYMPTOMS THAT SHOULD BE REPORTED IMMEDIATELY: *FEVER GREATER THAN 100.4 F (38 C) OR HIGHER *CHILLS OR SWEATING *NAUSEA AND VOMITING THAT IS NOT CONTROLLED WITH YOUR NAUSEA MEDICATION *UNUSUAL SHORTNESS OF BREATH *UNUSUAL BRUISING OR BLEEDING *URINARY PROBLEMS (pain or burning when urinating, or frequent urination) *BOWEL PROBLEMS (unusual diarrhea, constipation, pain near the anus) TENDERNESS IN MOUTH AND THROAT WITH OR WITHOUT PRESENCE OF ULCERS (sore throat, sores in mouth, or a toothache) UNUSUAL RASH, SWELLING OR PAIN  UNUSUAL VAGINAL DISCHARGE OR ITCHING   Items with * indicate a potential emergency and should be followed up as soon as possible or go to the Emergency Department if any problems should occur.  Please show the CHEMOTHERAPY  ALERT CARD or IMMUNOTHERAPY ALERT CARD at check-in to the Emergency Department and triage nurse.  Should you have questions after your visit or need to cancel or reschedule your appointment, please contact [facility] - A DEPT OF [facility]  [phone] and follow the prompts.  Office hours are 8:00 a.m. to 4:30 p.m. Monday - Friday. Please note that voicemails left after 4:00 p.m. Minasyan not be returned until the following business day.  We are closed weekends and major holidays. You have access to a nurse at all times for urgent questions. Please call the main number to the clinic [phone] and follow the prompts.  For any non-urgent questions, you Fridman also contact your provider using MyChart. We now offer e-Visits for anyone 83 and older to request care online for non-urgent symptoms. For details visit [email or web address].   Also download the MyChart app! Go to the app store, search MyChart, open the app, select , and log in with your MyChart username and password.

## 2024-07-10 NOTE — Assessment & Plan Note (Signed)
 Intermittent chronic issue for him.  He is asymptomatic.  Patient Maslowski liberate salt intake Check urine osmolarity, serum osmolarity, urine sodium.

## 2024-07-10 NOTE — Assessment & Plan Note (Signed)
 [place] course of Fluconadzole

## 2024-07-10 NOTE — Assessment & Plan Note (Signed)
 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  Ki-67 70% Right orchiectomy pathology results were reviewed and discussed with patient. Recommend R Mini CHOP, intrathecal chemotherapy with methotrexate , radiation.  Prechemo MUGA showed LVEF 51%.  I have asked pathology to add FISH testing for Bcl-2/BCL6/c-Myc.  Labs are reviewed and discussed with patient. Proceed with cycle 2 R - Mini CHOP.  D3 GCSF - recommend claritin 10mg  daily for 4 days. He gets Dexamethasone  on D1 and he takes prednisone  100mg  D2-5.  Plan to add intrathecal methotrexate  on D2. Rationale and side effects were reviewed with patient.

## 2024-07-10 NOTE — Assessment & Plan Note (Signed)
Chemotherapy as listed above. 

## 2024-07-11 ENCOUNTER — Telehealth: Payer: Self-pay

## 2024-07-11 ENCOUNTER — Other Ambulatory Visit: Payer: Self-pay

## 2024-07-11 ENCOUNTER — Ambulatory Visit
Admission: RE | Admit: 2024-07-11 | Discharge: 2024-07-11 | Disposition: A | Discharge status: Home / Self Care | Source: Ambulatory Visit | Attending: Oncology | Admitting: Oncology

## 2024-07-11 ENCOUNTER — Encounter: Payer: Self-pay | Admitting: Oncology

## 2024-07-11 VITALS — BP 116/59 | HR 73 | Temp 97.6°F | Resp 12

## 2024-07-11 DIAGNOSIS — C8580 Other specified types of non-Hodgkin lymphoma, unspecified site: Secondary | ICD-10-CM | POA: Diagnosis present

## 2024-07-11 MED ORDER — LIDOCAINE HCL (PF) 1 % IJ SOLN
10.0000 mL | Freq: Once | INTRAMUSCULAR | Status: AC
  Administered 2024-07-11: 10 mL

## 2024-07-11 MED ORDER — LIDOCAINE 1 % OPTIME INJ - NO CHARGE
5.0000 mL | Freq: Once | INTRAMUSCULAR | Status: AC
  Administered 2024-07-11: 5 mL
  Filled 2024-07-11: qty 6

## 2024-07-11 MED ORDER — SODIUM CHLORIDE (PF) 0.9 % IJ SOLN
Freq: Once | INTRAMUSCULAR | Status: DC
  Filled 2024-07-11: qty 0.48

## 2024-07-11 NOTE — Procedures (Signed)
 PROCEDURE SUMMARY:  Successful fluoroscopic guided lumbar puncture. However, no CSF returned despite 3 separate attempts, at L2-L3, L3-L4, and L4-L5. Methotrexate  not given, per Dr. Jerri. Ordering provider notified.  No immediate complications.  Pt tolerated well.   EBL = none  Please see full dictation in imaging section of Epic for procedure details.

## 2024-07-11 NOTE — Addendum Note (Signed)
 Addended by: BABARA CALL on: 07/11/2024 12:44 PM   Modules accepted: Orders

## 2024-07-11 NOTE — Progress Notes (Signed)
 Patient for IR Lumbar Puncture - Intrathecal Chemo Inj on Wed 07/12/24, I called and spoke with the patient on the phone and gave pre-procedure instructions. Pt was made aware to be here at 11a and check in at the new entrance. Pt stated understanding. Called 07/11/24

## 2024-07-11 NOTE — Progress Notes (Signed)
 Patient to return tomorrow for LP with Dr. Karalee.

## 2024-07-11 NOTE — Telephone Encounter (Signed)
 Attempt for Intrathecal methotrexate  was unsucessful today. Pt has been rescheduled to 7/23. Pt aware of new appt details.

## 2024-07-11 NOTE — Addendum Note (Signed)
 Addended by: BABARA CALL on: 07/11/2024 03:29 PM   Modules accepted: Orders

## 2024-07-12 ENCOUNTER — Other Ambulatory Visit: Payer: Self-pay | Admitting: Oncology

## 2024-07-12 ENCOUNTER — Inpatient Hospital Stay

## 2024-07-12 ENCOUNTER — Ambulatory Visit
Admission: RE | Admit: 2024-07-12 | Discharge: 2024-07-12 | Disposition: A | Discharge status: Home / Self Care | Source: Ambulatory Visit | Attending: Oncology | Admitting: Oncology

## 2024-07-12 DIAGNOSIS — C8580 Other specified types of non-Hodgkin lymphoma, unspecified site: Secondary | ICD-10-CM

## 2024-07-12 DIAGNOSIS — Z5112 Encounter for antineoplastic immunotherapy: Secondary | ICD-10-CM | POA: Diagnosis not present

## 2024-07-12 DIAGNOSIS — Z5111 Encounter for antineoplastic chemotherapy: Secondary | ICD-10-CM | POA: Diagnosis not present

## 2024-07-12 MED ORDER — PEGFILGRASTIM-CBQV 6 MG/0.6ML ~~LOC~~ SOSY
6.0000 mg | PREFILLED_SYRINGE | Freq: Once | SUBCUTANEOUS | Status: AC
  Administered 2024-07-12: 6 mg via SUBCUTANEOUS
  Filled 2024-07-12: qty 0.6

## 2024-07-12 MED ORDER — BUPIVACAINE HCL (PF) 0.5 % IJ SOLN
10.0000 mL | Freq: Once | INTRAMUSCULAR | Status: AC
  Administered 2024-07-12: 10 mL

## 2024-07-12 MED ORDER — SODIUM CHLORIDE (PF) 0.9 % IJ SOLN: Freq: Once | INTRAMUSCULAR | Status: AC

## 2024-07-12 MED ORDER — LIDOCAINE HCL (PF) 1 % IJ SOLN
INTRAMUSCULAR | Status: AC
  Filled 2024-07-12: qty 30

## 2024-07-14 ENCOUNTER — Encounter: Payer: Self-pay | Admitting: Oncology

## 2024-07-14 ENCOUNTER — Inpatient Hospital Stay

## 2024-07-14 LAB — COMP PANEL: LEUKEMIA/LYMPHOMA

## 2024-07-14 NOTE — Telephone Encounter (Signed)
 Clinical Social Work attempted to contact patient by phone to follow up after initial contact.  Left voicemail with contact information and request for return call.

## 2024-07-14 NOTE — Progress Notes (Signed)
 [facility] CSW Progress Note  Clinical Child psychotherapist contacted caregiver by phone to follow-up on caregiver/family issues.    Interventions: Provided brief mental health counseling with regard to caregiver support.  [name], patient's wife, stated she felt patient was handling the chemotherapy well.  He did complain of fatigue and food not tasting the way it is supposed to.       Follow Up Plan:  CSW [name] follow-up with patient by phone     Johnathan CHRISTELLA Au, LCSW Clinical Social Worker [facility]

## 2024-07-17 ENCOUNTER — Telehealth: Payer: Self-pay | Admitting: Cardiology

## 2024-07-17 ENCOUNTER — Telehealth: Payer: Self-pay | Admitting: *Deleted

## 2024-07-17 NOTE — Telephone Encounter (Signed)
 The patient has not had  stool for 4 days.he has diverticulosis they are saying that he got ciprofloxacin  and metronidazole  and he is on clear diet for 10 days. They to see about those not having stools for 4 days

## 2024-07-17 NOTE — Telephone Encounter (Signed)
 Pt c/o medication issue:  1. Name of Medication: amLODipine  (NORVASC ) 5 MG tablet   2. How are you currently taking this medication (dosage and times per day)?   3. Are you having a reaction (difficulty breathing--STAT)?   4. What is your medication issue? Wife said medication is making his legs swell and he would like to switch back to losartan .

## 2024-07-17 NOTE — Telephone Encounter (Signed)
 Called and spoke with the patient's wife and the patient. They state that he has recently been diagnosed with stage 3 cancer and is going through treatments for that. He has developed some swelling in his lower extremities, but denies any weight gain, chest pain, or shortness of breath. They endorse recurrence of diverticulitis and ongoing treatment for that. They want to switch back to Losartan  from Amlodipine . Asked for and they were able to provide some blood pressure and heart rate numbers. They are as follows:  7/28 -- 125/61, 58 7/27 -- 113/60, 63 7/17 -- 116/57, 58

## 2024-07-18 ENCOUNTER — Encounter: Payer: Self-pay | Admitting: Oncology

## 2024-07-18 ENCOUNTER — Ambulatory Visit

## 2024-07-18 ENCOUNTER — Ambulatory Visit: Payer: Self-pay | Admitting: Emergency Medicine

## 2024-07-18 MED ORDER — LOSARTAN POTASSIUM 50 MG PO TABS: 50.0000 mg | ORAL_TABLET | Freq: Every day | ORAL | 3 refills | Status: DC

## 2024-07-18 NOTE — Telephone Encounter (Signed)
 Spoke to pt for clarification. Pt states that he was started on on Cipro  and Metronidazole  for diverticulitis flare up. He will take for 10 days and today is day 4.   Pt states bowels have regulated and he has had 2 -3 bowel movements today without problem. Advised him to call back if new problems arise. No further concerns voiced.

## 2024-07-18 NOTE — Progress Notes (Signed)
 Called and spoke with the patient and wife to relay recommendation of starting Losartan  50 mg once daily and stopping Amlodipine  5 mg daily.  Appropriate changes made in EPIC. Order for prescription sent to [facility]. Patient and wife verbalized understanding with all questions and concerns addressed at this time.

## 2024-07-18 NOTE — Telephone Encounter (Signed)
 Called and spoke with the patient and wife to relay recommendation of starting Losartan  50 mg once daily and stopping Amlodipine  5 mg daily.  Appropriate changes made in EPIC. Order for prescription sent to [facility]. Patient and wife verbalized understanding with all questions and concerns addressed at this time.

## 2024-07-20 ENCOUNTER — Encounter: Payer: Self-pay | Admitting: Urology

## 2024-07-31 ENCOUNTER — Inpatient Hospital Stay: Attending: Oncology

## 2024-07-31 ENCOUNTER — Inpatient Hospital Stay (HOSPITAL_BASED_OUTPATIENT_CLINIC_OR_DEPARTMENT_OTHER): Admitting: Oncology

## 2024-07-31 ENCOUNTER — Encounter: Payer: Self-pay | Admitting: Oncology

## 2024-07-31 ENCOUNTER — Inpatient Hospital Stay

## 2024-07-31 VITALS — BP 120/52 | HR 68 | Temp 98.0°F | Resp 18

## 2024-07-31 VITALS — BP 133/49 | HR 60 | Temp 98.6°F | Resp 20 | Wt 132.3 lb

## 2024-07-31 DIAGNOSIS — Z5111 Encounter for antineoplastic chemotherapy: Secondary | ICD-10-CM

## 2024-07-31 DIAGNOSIS — C8580 Other specified types of non-Hodgkin lymphoma, unspecified site: Secondary | ICD-10-CM | POA: Diagnosis not present

## 2024-07-31 DIAGNOSIS — K5900 Constipation, unspecified: Secondary | ICD-10-CM | POA: Diagnosis not present

## 2024-07-31 DIAGNOSIS — B3781 Candidal esophagitis: Secondary | ICD-10-CM | POA: Diagnosis not present

## 2024-07-31 DIAGNOSIS — R042 Hemoptysis: Secondary | ICD-10-CM | POA: Insufficient documentation

## 2024-07-31 DIAGNOSIS — Z5112 Encounter for antineoplastic immunotherapy: Secondary | ICD-10-CM | POA: Insufficient documentation

## 2024-07-31 DIAGNOSIS — K920 Hematemesis: Secondary | ICD-10-CM | POA: Insufficient documentation

## 2024-07-31 DIAGNOSIS — E871 Hypo-osmolality and hyponatremia: Secondary | ICD-10-CM | POA: Insufficient documentation

## 2024-07-31 DIAGNOSIS — Z5189 Encounter for other specified aftercare: Secondary | ICD-10-CM | POA: Diagnosis not present

## 2024-07-31 DIAGNOSIS — C83398 Diffuse large b-cell lymphoma of other extranodal and solid organ sites: Secondary | ICD-10-CM | POA: Diagnosis not present

## 2024-07-31 LAB — CBC WITH DIFFERENTIAL (CANCER CENTER ONLY)
Abs Immature Granulocytes: 0.61 K/uL — ABNORMAL HIGH (ref 0.00–0.07)
Basophils Absolute: 0 K/uL (ref 0.0–0.1)
Basophils Relative: 1 %
Eosinophils Absolute: 0 K/uL (ref 0.0–0.5)
Eosinophils Relative: 0 %
HCT: 31.2 % — ABNORMAL LOW (ref 39.0–52.0)
Hemoglobin: 11 g/dL — ABNORMAL LOW (ref 13.0–17.0)
Immature Granulocytes: 8 %
Lymphocytes Relative: 17 %
Lymphs Abs: 1.2 K/uL (ref 0.7–4.0)
MCH: 30.8 pg (ref 26.0–34.0)
MCHC: 35.3 g/dL (ref 30.0–36.0)
MCV: 87.4 fL (ref 80.0–100.0)
Monocytes Absolute: 0.8 K/uL (ref 0.1–1.0)
Monocytes Relative: 10 %
Neutro Abs: 4.6 K/uL (ref 1.7–7.7)
Neutrophils Relative %: 64 %
Platelet Count: 215 K/uL (ref 150–400)
RBC: 3.57 MIL/uL — ABNORMAL LOW (ref 4.22–5.81)
RDW: 15.3 % (ref 11.5–15.5)
WBC Count: 7.2 K/uL (ref 4.0–10.5)
nRBC: 0 % (ref 0.0–0.2)

## 2024-07-31 LAB — CMP (CANCER CENTER ONLY)
ALT: 26 U/L (ref 0–44)
AST: 22 U/L (ref 15–41)
Albumin: 3.7 g/dL (ref 3.5–5.0)
Alkaline Phosphatase: 135 U/L — ABNORMAL HIGH (ref 38–126)
Anion gap: 9 (ref 5–15)
BUN: 10 mg/dL (ref 8–23)
CO2: 23 mmol/L (ref 22–32)
Calcium: 8.7 mg/dL — ABNORMAL LOW (ref 8.9–10.3)
Chloride: 95 mmol/L — ABNORMAL LOW (ref 98–111)
Creatinine: 0.7 mg/dL (ref 0.61–1.24)
GFR, Estimated: >60 mL/min (ref >60)
Glucose, Bld: 114 mg/dL — ABNORMAL HIGH (ref 70–99)
Potassium: 4.3 mmol/L (ref 3.5–5.1)
Sodium: 127 mmol/L — ABNORMAL LOW (ref 135–145)
Total Bilirubin: 0.6 mg/dL (ref 0.0–1.2)
Total Protein: 6.3 g/dL — ABNORMAL LOW (ref 6.5–8.1)

## 2024-07-31 MED ORDER — VINCRISTINE SULFATE CHEMO INJECTION 1 MG/ML
1.0000 mg | Freq: Once | INTRAVENOUS | Status: AC
  Administered 2024-07-31 (×2): 1 mg via INTRAVENOUS
  Filled 2024-07-31: qty 1

## 2024-07-31 MED ORDER — SODIUM CHLORIDE 0.9 % IV SOLN
400.0000 mg/m2 | Freq: Once | INTRAVENOUS | Status: AC
  Administered 2024-07-31 (×2): 660 mg via INTRAVENOUS
  Filled 2024-07-31: qty 33

## 2024-07-31 MED ORDER — PALONOSETRON HCL INJECTION 0.25 MG/5ML
0.2500 mg | Freq: Once | INTRAVENOUS | Status: AC
  Administered 2024-07-31 (×2): 0.25 mg via INTRAVENOUS
  Filled 2024-07-31: qty 5

## 2024-07-31 MED ORDER — SODIUM CHLORIDE 0.9 % IV SOLN
375.0000 mg/m2 | Freq: Once | INTRAVENOUS | Status: AC
  Administered 2024-07-31 (×2): 600 mg via INTRAVENOUS
  Filled 2024-07-31: qty 10

## 2024-07-31 MED ORDER — DEXAMETHASONE SODIUM PHOSPHATE 10 MG/ML IJ SOLN
10.0000 mg | Freq: Once | INTRAMUSCULAR | Status: AC
  Administered 2024-07-31 (×2): 10 mg via INTRAVENOUS
  Filled 2024-07-31: qty 1

## 2024-07-31 MED ORDER — SODIUM CHLORIDE (PF) 0.9 % IJ SOLN
Freq: Once | INTRAMUSCULAR | Status: DC
  Filled 2024-07-31: qty 0.48

## 2024-07-31 MED ORDER — DOXORUBICIN HCL CHEMO IV INJECTION 2 MG/ML
25.0000 mg/m2 | Freq: Once | INTRAVENOUS | Status: AC
  Administered 2024-07-31 (×2): 42 mg via INTRAVENOUS
  Filled 2024-07-31: qty 21

## 2024-07-31 MED ORDER — SODIUM CHLORIDE 0.9 % IV SOLN
INTRAVENOUS | Status: DC
  Filled 2024-07-31: qty 250

## 2024-07-31 MED ORDER — APREPITANT 130 MG/18ML IV EMUL
130.0000 mg | Freq: Once | INTRAVENOUS | Status: AC
  Administered 2024-07-31 (×2): 130 mg via INTRAVENOUS
  Filled 2024-07-31: qty 18

## 2024-07-31 MED ORDER — ACETAMINOPHEN 325 MG PO TABS
650.0000 mg | ORAL_TABLET | Freq: Once | ORAL | Status: AC
  Administered 2024-07-31 (×2): 650 mg via ORAL
  Filled 2024-07-31: qty 2

## 2024-07-31 MED ORDER — DIPHENHYDRAMINE HCL 25 MG PO CAPS
50.0000 mg | ORAL_CAPSULE | Freq: Once | ORAL | Status: AC
  Administered 2024-07-31 (×2): 50 mg via ORAL
  Filled 2024-07-31: qty 2

## 2024-07-31 NOTE — Assessment & Plan Note (Signed)
 Chemotherapy as listed above.

## 2024-07-31 NOTE — Assessment & Plan Note (Addendum)
 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  Ki-67 70%,  Right orchiectomy pathology results were reviewed and discussed with patient. Recommend R Mini CHOP, intrathecal chemotherapy with methotrexate , radiation.  Prechemo MUGA showed LVEF 51%. FISH Bcl-2/BCL6/c-Myc are all negative.  Labs are reviewed and discussed with patient. Proceed with cycle 3 R - Mini CHOP.  D3 GCSF - recommend claritin 10mg  daily for 4 days. He gets Dexamethasone  on D1 and he takes prednisone  100mg  D2-5.  intrathecal methotrexate  on D2.  I will repeat PET scan after 3 cycles for evaluation of treatment response.

## 2024-07-31 NOTE — Assessment & Plan Note (Signed)
 Intermittent chronic issue for him.  He is asymptomatic.  Patient Battaglini liberate salt intake Serum osmolarity is normal.

## 2024-07-31 NOTE — Assessment & Plan Note (Signed)
 Recommend patient to take calcium  1200 mg daily.  Take vitamin D supplementation.

## 2024-07-31 NOTE — Progress Notes (Signed)
 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  Ki-67 70% Right orchiectomy pathology results were reviewed and discussed with patient. Recommend R Mini CHOP, intrathecal chemotherapy with methotrexate , radiation.  Prechemo MUGA showed LVEF 51%.  I have asked pathology to add FISH testing for Bcl-2/BCL6/c-Myc, I have not seen the results back yet.  Will contact pathology to clarify.  Labs are reviewed and discussed with patient. Proceed with cycle 3 R - Mini CHOP.  D3 GCSF - recommend claritin 10mg  daily for 4 days. He gets Dexamethasone  on D1 and he takes prednisone  100mg  D2-5.  intrathecal methotrexate  on D2.  I will repeat PET scan after 3 cycles for evaluation of treatment response.    Encounter for antineoplastic chemotherapy Chemotherapy as listed above.   Hypocalcemia Recommend patient to take calcium  1200 mg daily.  Take vitamin D supplementation.   Hyponatremia Intermittent chronic issue for him.  He is asymptomatic.  Patient [name] salt intake Serum osmolarity is normal.   Orders Placed This Encounter  Procedures   NM PET Image Restag (PS) Skull Base To Thigh    Standing Status:   Future    Expected Date:   08/14/2024    Expiration Date:   07/31/2025    If indicated for the ordered procedure, I authorize the administration of a radiopharmaceutical per Radiology protocol:   Yes    Preferred imaging location?:   [facility]   DG FL LUMBAR PUNCTURE W/CHEMO INJECT    Need flow cytometry collected     Standing Status:   Future    Expected Date:   08/23/2024    Expiration Date:   07/31/2025    Lab orders requested (DO NOT place separate lab orders, these will be automatically ordered during procedure specimen collection)::   Other    Reason for Exam (SYMPTOM  OR DIAGNOSIS REQUIRED):   lymphoma; intrathecal methotrexate     [facility]   CBC with Differential (Cancer Center Only)    Standing Status:   Future    Expected Date:   08/22/2024    Expiration Date:   08/22/2025   CMP (Cancer Center only)    Standing Status:   Future    Expected Date:   08/22/2024    Expiration Date:   08/22/2025   CBC with Differential (Cancer Center Only)    Standing Status:   Future    Expected Date:   09/12/2024    Expiration Date:   09/12/2025   CMP (Cancer Center only)    Standing Status:   Future    Expected Date:   09/12/2024    Expiration Date:   09/12/2025   CBC with Differential (Cancer Center Only)    Standing Status:   Future    Expected Date:   10/03/2024    Expiration Date:   10/03/2025   CMP (Cancer Center only)    Standing Status:   Future    Expected Date:   10/03/2024    Expiration Date:   10/03/2025   Follow up 3 weeks All questions were answered.  The patient knows to call the clinic with any problems, questions or concerns.  Zelphia Cap, MD, PhD [facility] Hematology Oncology 07/31/2024    HISTORY OF PRESENTING ILLNESS:  [name] 82 y.o. male presents to establish care for testicular lymphoma.   Oncology History  Lymphoma, large cell (HCC)  04/25/2024 Imaging   ultrasound scrotum with Doppler showed Diffusely heterogeneous right testicular echotexture, most consistent with dominant mass or masses. Multiple left testicular masses. Favor lymphoproliferative process such as lymphoma or leukemia. Bilateral primary testicular neoplasms felt less likely.   Right epididymal enlargement and heterogeneity, favoring epididymitis. The appearance of the  testicles is felt unlikely to be be infectious/inflammatory.   04/28/2024 Imaging   CT chest with contrast abdomen pelvis with and without contrast showed 1. Heterogeneous enhancement in both testicles with substantial abnormal enlargement of the right testicle. This is suspicious for testicular neoplasm with lymphoblastic leukemia, lymphoma, or conceivably metastatic disease as top differential diagnostic considerations. Testicular adrenal rest tumors are a differential diagnostic consideration. 2. No overtly pathologic adenopathy identified. 3. Severe parenchymal atrophy of the pancreatic tail and body probably due to a 1.4 cm calcification/stone along the dorsal pancreatic duct at the junction of the body and head. Similar severe atrophy was shown on the CT chest from 01/07/2020. 4. Branching density in the superior segment right lower lobe favoring bronchiectasis with airway plugging. 5. Small type 1 hiatal hernia. 6. Prominent stool throughout the colon favors constipation. 7. Chronic appearing anterior wedge compressions at L1 and L2. 8. Lumbar spondylosis and degenerative disc disease at L5-S1 resulting in mild bilateral foraminal impingement. 9.  Aortic Atherosclerosis (ICD10-I70.0). 10. Nonspecific lesion peripherally in the right hepatic lobe. This could be further characterized with hepatic protocol MRI with and without contrast.   05/04/2024 Initial Diagnosis   Testicular lymphoma, large cell   He has experienced scrotal swelling since a hydrocele repair in 2022. The swelling decreased slightly post-surgery but never fully resolved. Recently, he received a report indicating concerns in both testicles, prompting further evaluation.    He reports neck soreness and swelling, initially attributed to physical activity such as painting and building a fence. The neck soreness is described as 'almost miserable.'   He mentions unintentional weight loss, noting a decrease from his  usual weight of 135-140 pounds to 122 pounds over the past year. Despite efforts to regain weight, including going on cruises, he has not been successful. He currently weighs 133 pounds with clothes on.   No excessive night sweats. He feels cold most of the time but has experienced some fever. No family history of cancer but there is a family history of high cholesterol and heart problems. He has not had a PSA test but underwent a colonoscopy about three years ago.    05/24/2024 Cancer Staging   Staging form: Hodgkin and Non-Hodgkin Lymphoma, AJCC 8th Edition - Clinical stage from 05/24/2024: Stage III (Diffuse large B-cell lymphoma) - Signed by Cap Zelphia, MD on 06/01/2024 Stage prefix: Initial diagnosis   06/16/2024 -  Chemotherapy   Patient is on Treatment Plan : NON-HODGKINS LYMPHOMA R-CHOP q21d     S/p EGD, stretch of stenosis, on Fluconazole  for esophageal candidiasis   Today patient reports feeling well.  He tolerated 2 cycles of R mini CHOP.  Status post 1 cycle of intrathecal methotrexate .  He had some mild back pain.  Denies any headache, nausea or vomiting.  Oral intake has improved.  No swallowing difficulties. Weight is stable.  MEDICAL HISTORY:  Past Medical History:  Diagnosis Date   Arthritis    Bilateral pleural effusion    Bone spur 2010   right shoulder   CAD (coronary artery disease)    a. 12/2019 NSTEMI/Cath: LM 99ost/m, LAD 40, LCX nl, OM1 80, RCA 70ost/p, 99p, 30d, EF 45-50%; b. 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   Complication of anesthesia    a.) delayed emergence; b.) emergence delirium   Diverticulosis    GERD (gastroesophageal reflux disease)    H/O atrial flutter    HFrEF (heart failure with reduced ejection fraction) (HCC)    a.) TEE 01/08/2020: EF 45-50%; b.) TTE 04/14/2022: EF 50-55%, septal HK, G2DD, GLS /18.3%, mild-mod MR, AoV sclerosis without stenosis.   Hyperlipidemia    Hypertension    Hyponatremia    IBS (irritable bowel  syndrome)    Ischemic cardiomyopathy    a. 12/2019 TEE: EF 45-50%. Nl RV fxn. Mild MR.   NSTEMI (non-ST elevated myocardial infarction) (HCC) 01/07/2020   a.) LHC 01/08/2020: EF 45-50%, LVEDP 22 mmHg; 99% o-mLM, 80% OM1, 40% mLAD, 70% o-pRCA, 99% pRCA, 30% dRCA --> consult CVTS; b.) 4v CABG 01/07/2022: LIMA-LAD, SVG-RPDA, SVG-OM1, SVG-OM2.   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   Skin cancer    a.) s/p Mohs surgery   Statin intolerance    Testicular mass     SURGICAL HISTORY: Past Surgical History:  Procedure Laterality Date   BACK SURGERY     Lumbar   BALLOON DILATION N/A 06/29/2024   Procedure: BALLOON DILATION;  Surgeon: Stacia Glendia BRAVO, MD;  Location: [facility] ENDOSCOPY;  Service: Gastroenterology;  Laterality: N/A;   BUNIONECTOMY     CATARACT EXTRACTION, BILATERAL     COLONOSCOPY     CORONARY ARTERY BYPASS GRAFT N/A 01/08/2020   Procedure: CORONARY ARTERY BYPASS GRAFTING (CABG) x 4  WITH ENDOSCOPIC HARVESTING OF BILATERAL GREATER SAPHENOUS VEINS;  Surgeon: Fleeta Hanford Coy, MD;  Location: [facility] OR;  Service: Open Heart Surgery;  Laterality: N/A;   ESOPHAGOGASTRODUODENOSCOPY N/A 06/29/2024   Procedure: EGD (ESOPHAGOGASTRODUODENOSCOPY);  Surgeon: Stacia Glendia BRAVO, MD;  Location: [facility] ENDOSCOPY;  Service: Gastroenterology;  Laterality: N/A;   HEMORRHOID SURGERY  2004   HYDROCELE EXCISION     INGUINAL HERNIA REPAIR  01/2010   IR IMAGING GUIDED PORT INSERTION  06/02/2024   LEFT HEART CATH AND CORONARY ANGIOGRAPHY N/A 01/08/2020   Procedure: LEFT HEART CATH AND CORONARY ANGIOGRAPHY;  Surgeon: Darron Deatrice LABOR, MD;  Location: [facility] INVASIVE CV LAB;  Service: Cardiovascular;  Laterality: N/A;   ORCHIECTOMY Right 05/12/2024   Procedure: ORCHIECTOMY;  Surgeon: Francisca Redell BROCKS, MD;   Location: [facility] ORS;  Service: Urology;  Laterality: Right;   ROTATOR CUFF REPAIR Left 06/2010   left/ bone spur Dr.Blackman   SHOULDER ARTHROSCOPY WITH OPEN ROTATOR CUFF REPAIR Right 05/26/2018   Procedure: SHOULDER ARTHROSCOPY WITH OPEN ROTATOR CUFF REPAIR;  Surgeon: Edie Norleen PARAS, MD;  Location: [facility] ORS;  Service: Orthopedics;  Laterality: Right;  debridement, decompression   SKIN CANCER EXCISION  2003-2005   Mohs-right shoulder   TEE WITHOUT CARDIOVERSION N/A 01/08/2020   Procedure: TRANSESOPHAGEAL ECHOCARDIOGRAM (TEE);  Surgeon: Fleeta Hanford, Coy, MD;  Location: [facility] OR;  Service: Open Heart Surgery;  Laterality: N/A;   TOTAL HIP ARTHROPLASTY Right  07/14/2022   Procedure: TOTAL HIP ARTHROPLASTY ANTERIOR APPROACH;  Surgeon: Kathlynn Sharper, MD;  Location: [facility] ORS;  Service: Orthopedics;  Laterality: Right;    SOCIAL HISTORY: Social History   Socioeconomic History   Marital status: Married    Spouse name: Not on file   Number of children: 2   Years of education: Not on file   Highest education level: Not on file  Occupational History   Occupation: retired Academic librarian: retired  Tobacco Use   Smoking status: Never   Smokeless tobacco: Never  Vaping Use   Vaping status: Never Used  Substance and Sexual Activity   Alcohol use: Not Currently    Alcohol/week: 1.0 standard drink of alcohol    Types: 1 Glasses of wine per week    Comment: very rare   Drug use: No   Sexual activity: Not on file  Other Topics Concern   Not on file  Social History Narrative   2 sons, no contact   Splits his time between [place]  and [place]    Has live-in since 2005   Has living will   Requests Bernice--girlfriend or brother Lamar, as health care POA   Would accept resuscitation but no prolonged artificial ventilation   Wouldn't want tube feeds if cognitively unaware   Social Drivers of Health   Financial Resource Strain: Low Risk  (04/05/2024)   Received  from [facility]   Overall Financial Resource Strain (CARDIA)    Difficulty of Paying Living Expenses: Not hard at all  Food Insecurity: No Food Insecurity (04/05/2024)   Received from [facility]   Hunger Vital Sign    Within the past 12 months, you worried that your food would run out before you got the money to buy more.: Never true    Within the past 12 months, the food you bought just didn't last and you didn't have money to get more.: Never true  Transportation Needs: No Transportation Needs (04/05/2024)   Received [facility] - Transportation    In the past 12 months, has lack of transportation kept you from medical appointments or from getting medications?: No    Lack of Transportation (Non-Medical): No  Physical Activity: Not on file  Stress: Not on file  Social Connections: Not on file  Intimate Partner Violence: Not on file    FAMILY HISTORY: Family History  Problem Relation Age of Onset   Heart disease Mother    Alzheimer's disease Mother    Heart attack Father    Heart disease Brother    Stroke Brother    Cancer Neg Hx    Diabetes Neg Hx    Colon cancer Neg Hx    Esophageal cancer Neg Hx    Stomach cancer Neg Hx    Pancreatic cancer Neg Hx     ALLERGIES:  is allergic to crestor [rosuvastatin], penicillins, peanut-containing drug products, atorvastatin, and simvastatin.  MEDICATIONS:  Current Outpatient Medications  Medication Sig Dispense Refill   acetaminophen  (TYLENOL ) 500 MG tablet Take 500-1,000 mg by mouth every 6 (six) hours as needed for moderate pain (pain score 4-6).     acyclovir  (ZOVIRAX ) 400 MG tablet Take 1 tablet (400 mg total) by mouth daily. 30 tablet 3   allopurinol  (ZYLOPRIM ) 300 MG tablet Take 1 tablet (300 mg total) by mouth daily. 30 tablet 3   calcium  carbonate (TUMS - DOSED IN MG ELEMENTAL CALCIUM )  500 MG chewable tablet Chew 2 tablets (400 mg of elemental calcium  total)  by mouth daily.     docusate sodium  (COLACE) 100 MG capsule Take 1 capsule (100 mg total) by mouth 2 (two) times daily. 10 capsule 0   Evolocumab  (REPATHA  SURECLICK) 140 MG/ML SOAJ Inject 140 mg into the skin every 14 (fourteen) days. 2 mL 11   lidocaine -prilocaine  (EMLA ) cream Apply to affected area once 30 g 3   losartan  (COZAAR ) 50 MG tablet Take 1 tablet (50 mg total) by mouth daily. 90 tablet 3   magic mouthwash (multi-ingredient) oral suspension Swish and spit 5-10 mLs 4 (four) times daily as needed for mouth pain. 480 mL 1   metoprolol  succinate (TOPROL -XL) 25 MG 24 hr tablet Take 0.5 tablets (12.5 mg total) by mouth daily. 45 tablet 3   nitroGLYCERIN  (NITROSTAT ) 0.4 MG SL tablet Place 0.4 mg under the tongue every 5 (five) minutes as needed for chest pain.     omeprazole  (PRILOSEC) 20 MG capsule Take 1 capsule (20 mg total) by mouth 2 (two) times daily before a meal. 120 capsule 0   ondansetron  (ZOFRAN ) 8 MG tablet Take 1 tablet (8 mg total) by mouth every 8 (eight) hours as needed for nausea or vomiting. Start on the third day after cyclophosphamide  chemotherapy. 30 tablet 1   predniSONE  (DELTASONE ) 50 MG tablet Take 2 tablets (100 mg total) by mouth See admin instructions. Take 100mg  daily on Day 2-5 of chemotherapy.     prochlorperazine  (COMPAZINE ) 10 MG tablet Take 1 tablet (10 mg total) by mouth every 6 (six) hours as needed for nausea or vomiting. 30 tablet 6   tadalafil  (CIALIS ) 10 MG tablet Take 1-2 tablets (10-20 mg total) by mouth daily as needed for erectile dysfunction (take 1 hour prior to sexual activity). 30 tablet 11   traMADol  (ULTRAM ) 50 MG tablet Take 1 tablet (50 mg total) by mouth every 6 (six) hours. 30 tablet 0   No current facility-administered medications for this visit.   Facility-Administered Medications Ordered in Other Visits  Medication Dose Route Frequency Provider Last Rate Last Admin   0.9 %  sodium chloride  infusion   Intravenous Continuous Babara Call, MD    Stopped at 07/31/24 1541   [START ON 08/01/2024] methotrexate  (PF) 12 mg, hydrocortisone  sodium succinate  (SOLU-CORTEF ) 50 mg in sodium chloride  (PF) 0.9 % INTRATHECAL chemo injection   Intrathecal Once Babara Call, MD        Review of Systems  Constitutional:  Positive for fatigue. Negative for appetite change, chills, fever and unexpected weight change.  HENT:   Negative for hearing loss and voice change.   Eyes:  Negative for eye problems and icterus.  Respiratory:  Negative for chest tightness, cough and shortness of breath.   Cardiovascular:  Negative for chest pain and leg swelling.  Gastrointestinal:  Negative for abdominal distention and abdominal pain.  Endocrine: Negative for hot flashes.  Genitourinary:  Negative for difficulty urinating, dysuria and frequency.        Status post right orchiectomy  Musculoskeletal:  Positive for arthralgias and neck pain.  Skin:  Negative for itching and rash.  Neurological:  Negative for light-headedness and numbness.  Hematological:  Negative for adenopathy. Does not bruise/bleed easily.  Psychiatric/Behavioral:  Negative for confusion.      PHYSICAL EXAMINATION: ECOG PERFORMANCE STATUS: 1 - Symptomatic but completely ambulatory  Vitals:   07/31/24 0950  BP: (!) 133/49  Pulse: 60  Resp: 20  Temp: 98.6 F (  37 C)  SpO2: 100%   Filed Weights   07/31/24 0950  Weight: 132 lb 4.8 oz (60 kg)    Physical Exam Constitutional:      General: He is not in acute distress.    Appearance: He is not diaphoretic.  HENT:     Head: Normocephalic and atraumatic.  Eyes:     General: No scleral icterus. Cardiovascular:     Rate and Rhythm: Normal rate and regular rhythm.     Heart sounds: No murmur heard. Pulmonary:     Effort: Pulmonary effort is normal. No respiratory distress.     Breath sounds: No wheezing.  Abdominal:     General: There is no distension.     Palpations: Abdomen is soft.     Tenderness: There is no abdominal tenderness.   Musculoskeletal:        General: Normal range of motion.     Cervical back: Normal range of motion and neck supple.  Skin:    General: Skin is warm and dry.     Findings: No erythema.  Neurological:     Mental Status: He is alert and oriented to person, place, and time. Mental status is at baseline.     Motor: No abnormal muscle tone.  Psychiatric:        Mood and Affect: Affect normal.     LABORATORY DATA:  I have reviewed the data as listed    Latest Ref Rng & Units 07/31/2024    9:36 AM 07/10/2024    7:57 AM 06/27/2024    1:40 PM  CBC  WBC 4.0 - 10.5 K/uL 7.2  12.2  20.8   Hemoglobin 13.0 - 17.0 g/dL 88.9  88.3  87.4   Hematocrit 39.0 - 52.0 % 31.2  34.2  36.9   Platelets 150 - 400 K/uL 215  302  188       Latest Ref Rng & Units 07/31/2024    9:36 AM 07/10/2024    7:57 AM 06/27/2024    1:40 PM  CMP  Glucose 70 - 99 mg/dL 885  860  96   BUN 8 - 23 mg/dL 10  16  9    Creatinine 0.61 - 1.24 mg/dL 9.29  9.26  9.08   Sodium 135 - 145 mmol/L 127  131  134   Potassium 3.5 - 5.1 mmol/L 4.3  4.1  4.0   Chloride 98 - 111 mmol/L 95  100  100   CO2 22 - 32 mmol/L 23  23  24    Calcium  8.9 - 10.3 mg/dL 8.7  8.9  9.1   Total Protein 6.5 - 8.1 g/dL 6.3  6.9  7.1   Total Bilirubin 0.0 - 1.2 mg/dL 0.6  0.6  0.9   Alkaline Phos 38 - 126 U/L 135  171  215   AST 15 - 41 U/L 22  26  20    ALT 0 - 44 U/L 26  23  32      RADIOGRAPHIC STUDIES: I have personally reviewed the radiological images as listed and agreed with the findings in the report. DG FL LUMBAR PUNCTURE W/CHEMO INJECT Result Date: 07/12/2024 CLINICAL DATA:  83 year old male with a history of large cell lymphoma the testicle. He presents for image guided lumbar puncture, CSF fluid collection for flow cytometry and instillation of intrathecal methotrexate . EXAM: FLUOROSCOPICALLY GUIDED LUMBAR PUNCTURE FOR INTRATHECAL CHEMOTHERAPY FLUOROSCOPY: Radiation Exposure Index (as provided by the fluoroscopic device): 9.7 mGy Kerma  PROCEDURE: Informed consent was  obtained from the patient prior to the procedure, including potential complications of headache, allergy, and pain. With the patient prone, the lower back was prepped with Betadine. 1% Lidocaine  was used for local anesthesia. Lumbar puncture was performed at the L3-L4 level using a gauge needle with return of clearCSF. A total of 8 mL CSF of was obtained for flow cytometry. Next, 5 mL of methotrexate  was injected into the subarachnoid space. The patient tolerated the procedure well without apparent complication. IMPRESSION: Successful L3-L4 lumbar puncture, collection of 8 mL CSF and installation of 5 mL methotrexate . Collected cerebrospinal fluid will be sent for flow cytometry as ordered by the requesting provider. Electronically Signed   By: Wilkie Lent M.D.   On: 07/12/2024 12:50   DG FL LUMBAR PUNCTURE W/CHEMO INJECT Result Date: 07/11/2024 CLINICAL DATA:  83 year old male with recent diagnosis of large cell lymphoma. Lumbar puncture for methotrexate  administration was requested. EXAM: FLUOROSCOPICALLY GUIDED LUMBAR PUNCTURE FOR INTRATHECAL CHEMOTHERAPY FLUOROSCOPY: Radiation Exposure Index (as provided by the fluoroscopic device): 26.10 mGy Kerma PROCEDURE: Informed consent was obtained from the patient prior to the procedure, including potential complications of headache, allergy, and pain. With the patient prone, the lower back was prepped with Betadine. 1% Lidocaine  was used for local anesthesia. Lumbar puncture was performed at the L2-L3, L3-L4, and L4-L5 levels, using a 20 gauge needle with no return of CSF despite patient reporting transient sharp sensations, suspected to represent nerve stimulation. Multiple needle adjustments were made, and the patient was placed in reverse Trendelenburg position. No return of CSF was visualized, including with patient cough. Decision was made to abort the procedure at this time given inability to confirm intrathecal placement  of the spinal needle. No methotrexate  was given. The patient tolerated the procedure well without apparent complication. IMPRESSION: 1. Technically unsuccessful lumbar puncture, without spontaneous return of CSF despite multiple attempts as described. No administration of methotrexate . 2. Patient will be rescheduled for a repeat lumbar puncture procedure, ideally using C-arm for lateral confirmation. Intrathecal injection of contrast material could also be considered at that time to confirm needle placement if there is again no return of CSF. The patient was encouraged to hydrate prior to repeat procedure and will be given additional fluids to drink prior to the procedure. Procedure performed by Carlin Griffon, PA-C and supervised and interpreted by Manford Cummins, MD. These results were communicated by secure chat on 07/11/2024 at 11:02 a.m. to provider Padraic Marinos and clinical staff. Electronically Signed   By: Limin  Xu M.D.   On: 07/11/2024 12:11

## 2024-08-01 ENCOUNTER — Encounter: Payer: Self-pay | Admitting: Hematology

## 2024-08-01 ENCOUNTER — Ambulatory Visit
Admission: RE | Admit: 2024-08-01 | Discharge: 2024-08-01 | Disposition: A | Discharge status: Home / Self Care | Source: Ambulatory Visit | Attending: Oncology | Admitting: Oncology

## 2024-08-01 ENCOUNTER — Other Ambulatory Visit: Payer: Self-pay

## 2024-08-01 ENCOUNTER — Inpatient Hospital Stay

## 2024-08-01 VITALS — BP 124/62 | HR 77 | Temp 97.6°F | Resp 15

## 2024-08-01 DIAGNOSIS — C8580 Other specified types of non-Hodgkin lymphoma, unspecified site: Secondary | ICD-10-CM | POA: Insufficient documentation

## 2024-08-01 MED ORDER — SODIUM CHLORIDE (PF) 0.9 % IJ SOLN: Freq: Once | INTRAMUSCULAR | Status: DC

## 2024-08-01 MED ORDER — LIDOCAINE 1 % OPTIME INJ - NO CHARGE
10.0000 mL | Freq: Once | INTRAMUSCULAR | Status: AC
  Administered 2024-08-01 (×2): 10 mL via INTRADERMAL
  Filled 2024-08-01: qty 10

## 2024-08-01 MED ORDER — SODIUM CHLORIDE (PF) 0.9 % IJ SOLN
Freq: Once | INTRAMUSCULAR | Status: DC
  Filled 2024-08-01: qty 0.48

## 2024-08-01 NOTE — Progress Notes (Signed)
 Nutrition Follow-up:  Patient with b cell lymphoma. Receiving R mini CHOP.  S/p EGD on 7/10  Spoke with patient via phone for nutrition follow-up.  Reports that his appetite is good. Eating a variety of foods (meats, vegetables, fruits, etc).  Drinking protein shake daily and gatorade.  Says that he has been having issues with constipation.  Noted PCP recommended sennokot and miralax.      Medications: reviewed  Labs: reviewed  Anthropometrics:   Weight 132 lb 4.8 oz on 8/11  129 lb 5 oz on 7/15   NUTRITION DIAGNOSIS: Inadequate oral intake improved   INTERVENTION:  Start bowel regimen to help with constipation.  Patient will pick up senokot today.  Continue with high calorie, high protein foods to prevent weight loss    MONITORING, EVALUATION, GOAL: weight trends, intake   NEXT VISIT: Tuesday, Sept 2nd during infusion  [name] SOLON, CSO, LDN Registered Dietitian [phone]

## 2024-08-02 ENCOUNTER — Inpatient Hospital Stay

## 2024-08-02 DIAGNOSIS — C8580 Other specified types of non-Hodgkin lymphoma, unspecified site: Secondary | ICD-10-CM

## 2024-08-02 DIAGNOSIS — Z5111 Encounter for antineoplastic chemotherapy: Secondary | ICD-10-CM | POA: Diagnosis not present

## 2024-08-02 MED ORDER — PEGFILGRASTIM-CBQV 6 MG/0.6ML ~~LOC~~ SOSY
6.0000 mg | PREFILLED_SYRINGE | Freq: Once | SUBCUTANEOUS | Status: AC
  Administered 2024-08-02 (×2): 6 mg via SUBCUTANEOUS
  Filled 2024-08-02: qty 0.6

## 2024-08-03 LAB — COMP PANEL: LEUKEMIA/LYMPHOMA

## 2024-08-07 ENCOUNTER — Telehealth: Payer: Self-pay | Admitting: *Deleted

## 2024-08-07 NOTE — Telephone Encounter (Signed)
 Will returned call and said that when Mr. [name] coughs up he has small specs of blood, denies bright red blood. She also states that he has been having severe constipation. They would like [facility] appt. Pt has been scheduled with [facility] on 8/19.

## 2024-08-07 NOTE — Telephone Encounter (Signed)
 The wife had called over and hit the button for scans.  So when the person got that information she called me about this issue.  The patient has had 3 episodes of some blood for 2 days and then she said from today.  The patient had like little specks today so far.  He had his esophagus stretched and they also sent him some fluconazole  for 21- days.  He also is having really bad constipation for days.  He would like to see what they can help from getting all that blood at times and what they can do with the constipation what medicines to take.  He wondered if they could give him another fluconazole  for 21 days if they thought that that would help to

## 2024-08-07 NOTE — Telephone Encounter (Signed)
 Called patient back for further assessment. Per Dr. Babara, if pt is having bright red blood, then she recommends for ER, but if blood is mild then she recommends evaluation by [facility].

## 2024-08-08 ENCOUNTER — Inpatient Hospital Stay (HOSPITAL_BASED_OUTPATIENT_CLINIC_OR_DEPARTMENT_OTHER): Admitting: Nurse Practitioner

## 2024-08-08 ENCOUNTER — Encounter: Payer: Self-pay | Admitting: Nurse Practitioner

## 2024-08-08 VITALS — BP 130/85 | HR 62 | Temp 98.7°F | Wt 135.0 lb

## 2024-08-08 DIAGNOSIS — K5900 Constipation, unspecified: Secondary | ICD-10-CM | POA: Diagnosis not present

## 2024-08-08 DIAGNOSIS — B3781 Candidal esophagitis: Secondary | ICD-10-CM

## 2024-08-08 DIAGNOSIS — Z5111 Encounter for antineoplastic chemotherapy: Secondary | ICD-10-CM | POA: Diagnosis not present

## 2024-08-08 MED ORDER — FLUCONAZOLE 200 MG PO TABS: ORAL_TABLET | ORAL | 0 refills | Status: DC

## 2024-08-08 NOTE — Patient Instructions (Addendum)
 It is common for patients who are undergoing treatment and taking certain prescribed medications to experience side-effects with constipation.  If you experience constipation, please take stool softeners such as Senna and Miralax every day to avoid constipation.   Senna- a laxative- take 1-2 tablets 1-3 times a day as needed to encourage bowel motility Miralax- brings water  into your stool to keep it from getting hard- Take 1 capful in 6-8 oz of liquid 1-3 times a day. I  These medications are available over the counter.  Of course, if you have diarrhea, stop taking stool softeners.  Drinking plenty of fluid, eating fruits and vegetable, and being active also reduces the risk of constipation.   If despite taking stool softeners, and you still have no bowel movement for 2 days or more than your normal bowel habit frequency, please take one of the following over the counter laxatives:  Milk of Magnesia or Mag Citrate everyday OR 1 capful of miralax in liquid every 15 minutes along with 2 senna until BM achieve. The goal is to have at least one bowel movement every day or every other day without pain or straining. If you don't achieve results or have questions, contact the clinic.

## 2024-08-08 NOTE — Progress Notes (Signed)
 Symptom Management Clinic  [facility] at [facility] A Department of the [place]. [facility] [address] [place], [place] [address] [phone] (phone) [phone] (fax)  Patient Care Team: Sherial Bail, MD as PCP - General (Internal Medicine) Darliss Rogue, MD as PCP - Cardiology (Cardiology) Babara Call, MD as Consulting Physician (Oncology)   Name of the patient: [name]  [id number]  Jan 31, 1941   Date of visit: 08/08/24  Diagnosis- Lymphoma  Chief complaint/ Reason for visit- constipation & bloody sputum  Heme/Onc history:  Oncology History  Lymphoma, large cell (HCC)  04/25/2024 Imaging   ultrasound scrotum with Doppler showed Diffusely heterogeneous right testicular echotexture, most consistent with dominant mass or masses. Multiple left testicular masses. Favor lymphoproliferative process such as lymphoma or leukemia. Bilateral primary testicular neoplasms felt less likely.   Right epididymal enlargement and heterogeneity, favoring epididymitis. The appearance of the testicles is felt unlikely to be be infectious/inflammatory.   04/28/2024 Imaging   CT chest with contrast abdomen pelvis with and without contrast showed 1. Heterogeneous enhancement in both testicles with substantial abnormal enlargement of the right testicle. This is suspicious for testicular neoplasm with lymphoblastic leukemia, lymphoma, or conceivably metastatic disease as top differential diagnostic considerations. Testicular adrenal rest tumors are a differential diagnostic consideration. 2. No overtly pathologic adenopathy identified. 3. Severe parenchymal atrophy of the pancreatic tail and body probably due to a 1.4 cm calcification/stone along the dorsal pancreatic duct at the junction of the body and head. Similar severe atrophy was shown on the CT chest from 01/07/2020. 4. Branching density in the superior segment right lower  lobe favoring bronchiectasis with airway plugging. 5. Small type 1 hiatal hernia. 6. Prominent stool throughout the colon favors constipation. 7. Chronic appearing anterior wedge compressions at L1 and L2. 8. Lumbar spondylosis and degenerative disc disease at L5-S1 resulting in mild bilateral foraminal impingement. 9.  Aortic Atherosclerosis (ICD10-I70.0). 10. Nonspecific lesion peripherally in the right hepatic lobe. This could be further characterized with hepatic protocol MRI with and without contrast.   05/04/2024 Initial Diagnosis   Testicular lymphoma, large cell   He has experienced scrotal swelling since a hydrocele repair in 2022. The swelling decreased slightly post-surgery but never fully resolved. Recently, he received a report indicating concerns in both testicles, prompting further evaluation.    He reports neck soreness and swelling, initially attributed to physical activity such as painting and building a fence. The neck soreness is described as 'almost miserable.'   He mentions unintentional weight loss, noting a decrease from his usual weight of 135-140 pounds to 122 pounds over the past year. Despite efforts to regain weight, including going on cruises, he has not been successful. He currently weighs 133 pounds with clothes on.   No excessive night sweats. He feels cold most of the time but has experienced some fever. No family history of cancer but there is a family history of high cholesterol and heart problems. He has not had a PSA test but underwent a colonoscopy about three years ago.    05/24/2024 Cancer Staging   Staging form: Hodgkin and Non-Hodgkin Lymphoma, AJCC 8th Edition - Clinical stage from 05/24/2024: Stage III (Diffuse large B-cell lymphoma) - Signed by Babara Call, MD on 06/01/2024 Stage prefix: Initial diagnosis   06/16/2024 -  Chemotherapy   Patient is on Treatment Plan : NON-HODGKINS LYMPHOMA R-CHOP q21d       Interval History- [name] is 83 y.o.  currently undergoing  R-CHOP chemotherapy who presents to symptom management clinic for complaints of specks of blood in his secretions and constipation. He had EGD and dilation with Dr Stacia in early July. He was also found to have esophageal candidiasis at that time. He was treated with fluconazole . In the past week he's noticed specks of blood and brown sputum. Not increasing in volume or frequency. Also complains of 5 day history of constipation. Has now had a BM but ongoing issue post chemo that for 1 week he has trouble with his bowel movements. Has been trying combinations of dulcolax, senna, miralax, milk of magnesia, prune juice, and apple juice. Complains of inability to walk long distances which has worsened since taking treatment. Legs get tired and he needs to rest.  ECOG FS:1 - Symptomatic but completely ambulatory  Review of systems- Review of Systems  Constitutional:  Positive for malaise/fatigue. Negative for chills, fever and weight loss.  HENT:  Negative for hearing loss, nosebleeds, sore throat and tinnitus.   Eyes:  Negative for blurred vision and double vision.  Respiratory:  Positive for sputum production. Negative for cough, hemoptysis, shortness of breath and wheezing.   Cardiovascular:  Negative for chest pain, palpitations and leg swelling.  Gastrointestinal:  Positive for constipation. Negative for abdominal pain, blood in stool, diarrhea, melena, nausea and vomiting.  Genitourinary:  Negative for dysuria and urgency.  Musculoskeletal:  Negative for back pain, falls, joint pain and myalgias.  Skin:  Negative for itching and rash.  Neurological:  Negative for dizziness, tingling, sensory change, loss of consciousness, weakness and headaches.  Endo/Heme/Allergies:  Negative for environmental allergies. Does not bruise/bleed easily.  Psychiatric/Behavioral:  Negative for depression. The patient is not nervous/anxious and does not have insomnia.       Allergies   Allergen Reactions   Crestor [Rosuvastatin] Other (See Comments)    Severe myalgias   Penicillins Rash   Peanut-Containing Drug Products Other (See Comments)    Wired him up   Atorvastatin Other (See Comments)    ache   Simvastatin Other (See Comments)    aching   Past Medical History:  Diagnosis Date   Arthritis    Bilateral pleural effusion    Bone spur 2010   right shoulder   CAD (coronary artery disease)    a. 12/2019 NSTEMI/Cath: LM 99ost/m, LAD 40, LCX nl, OM1 80, RCA 70ost/p, 99p, 30d, EF 45-50%; b. 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   Complication of anesthesia    a.) delayed emergence; b.) emergence delirium   Diverticulosis    GERD (gastroesophageal reflux disease)    H/O atrial flutter    HFrEF (heart failure with reduced ejection fraction) (HCC)    a.) TEE 01/08/2020: EF 45-50%; b.) TTE 04/14/2022: EF 50-55%, septal HK, G2DD, GLS /18.3%, mild-mod MR, AoV sclerosis without stenosis.   Hyperlipidemia    Hypertension    Hyponatremia    IBS (irritable bowel syndrome)    Ischemic cardiomyopathy    a. 12/2019 TEE: EF 45-50%. Nl RV fxn. Mild MR.   NSTEMI (non-ST elevated myocardial infarction) (HCC) 01/07/2020   a.) LHC 01/08/2020: EF 45-50%, LVEDP 22 mmHg; 99% o-mLM, 80% OM1, 40% mLAD, 70% o-pRCA, 99% pRCA, 30% dRCA --> consult CVTS; b.) 4v CABG 01/07/2022: LIMA-LAD, SVG-RPDA, SVG-OM1, SVG-OM2.   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   Skin cancer    a.) s/p Mohs surgery   Statin intolerance    Testicular mass    Past Surgical History:  Procedure Laterality Date   BACK SURGERY     Lumbar   BALLOON DILATION N/A 06/29/2024   Procedure: BALLOON DILATION;  Surgeon: Stacia Glendia BRAVO, MD;  Location[facility] ENDOSCOPY;  Service: Gastroenterology;   Laterality: N/A;   BUNIONECTOMY     CATARACT EXTRACTION, BILATERAL     COLONOSCOPY     CORONARY ARTERY BYPASS GRAFT N/A 01/08/2020   Procedure: CORONARY ARTERY BYPASS GRAFTING (CABG) x 4  WITH ENDOSCOPIC HARVESTING OF BILATERAL GREATER SAPHENOUS VEINS;  Surgeon: Fleeta Hanford Coy, MD;  Location: [facility] OR;  Service: Open Heart Surgery;  Laterality: N/A;   ESOPHAGOGASTRODUODENOSCOPY N/A 06/29/2024   Procedure: EGD (ESOPHAGOGASTRODUODENOSCOPY);  Surgeon: Stacia Glendia BRAVO, MD;  Location: [facility] ENDOSCOPY;  Service: Gastroenterology;  Laterality: N/A;   HEMORRHOID SURGERY  2004   HYDROCELE EXCISION     INGUINAL HERNIA REPAIR  01/2010   IR IMAGING GUIDED PORT INSERTION  06/02/2024   LEFT HEART CATH AND CORONARY ANGIOGRAPHY N/A 01/08/2020   Procedure: LEFT HEART CATH AND CORONARY ANGIOGRAPHY;  Surgeon: Darron Deatrice LABOR, MD;  Location: [facility] INVASIVE CV LAB;  Service: Cardiovascular;  Laterality: N/A;   ORCHIECTOMY Right 05/12/2024   Procedure: ORCHIECTOMY;  Surgeon: Francisca Redell BROCKS, MD;  Location: [facility] ORS;  Service: Urology;  Laterality: Right;   ROTATOR CUFF REPAIR Left 06/2010   left/ bone spur Dr.Blackman   SHOULDER ARTHROSCOPY WITH OPEN ROTATOR CUFF REPAIR Right 05/26/2018   Procedure: SHOULDER ARTHROSCOPY WITH OPEN ROTATOR CUFF REPAIR;  Surgeon: Edie Norleen PARAS, MD;  Location: [facility] ORS;  Service: Orthopedics;  Laterality: Right;  debridement, decompression   SKIN CANCER EXCISION  2003-2005   Mohs-right shoulder   TEE WITHOUT CARDIOVERSION N/A 01/08/2020   Procedure: TRANSESOPHAGEAL ECHOCARDIOGRAM (TEE);  Surgeon: Fleeta Hanford, Coy, MD;  Location: [facility] OR;  Service: Open Heart Surgery;  Laterality: N/A;   TOTAL HIP ARTHROPLASTY Right 07/14/2022   Procedure: TOTAL HIP ARTHROPLASTY ANTERIOR APPROACH;  Surgeon: Kathlynn Sharper, MD;  Location: [facility] ORS;  Service: Orthopedics;  Laterality: Right;   Social History   Socioeconomic History   Marital status: Married    Spouse name: Not on file   Number of  children: 2   Years of education: Not on file   Highest education level: Not on file  Occupational History   Occupation: retired Academic librarian: retired  Tobacco Use   Smoking status: Never   Smokeless tobacco: Never  Vaping Use   Vaping status: Never Used  Substance and Sexual Activity   Alcohol use: Not Currently    Alcohol/week: 1.0 standard drink of alcohol    Types: 1 Glasses of wine per week    Comment: very rare   Drug use: No   Sexual activity: Not on file  Other Topics Concern   Not on file  Social History Narrative   2 sons, no contact   Splits his time between [place]  and [place]    Has live-in since 2005   Has living will   Requests Bernice--girlfriend or brother Lamar, as health care POA   Would accept resuscitation but no prolonged artificial ventilation   Wouldn't want tube feeds if cognitively unaware   Social Drivers of Health   Financial Resource Strain: Low Risk  (08/01/2024)   Received from [facility]   Overall Financial Resource Strain (CARDIA)    Difficulty of Paying Living Expenses: Not hard at all  Food Insecurity: No Food Insecurity (08/01/2024)   Received from [facility]   Hunger Vital Sign    Within the past 12 months, you worried that your food would run out before you got the money to buy more.: Never true    Within the past 12 months, the food you bought just didn't last and you didn't have money to get more.: Never true  Transportation Needs: No Transportation Needs (08/01/2024)   Received from [facility] - Transportation    In the past 12 months, has lack of transportation kept you from medical appointments or from getting medications?: No    Lack of Transportation (Non-Medical): No  Physical Activity: Not on file  Stress: Not on file  Social Connections: Not on file  Intimate Partner Violence: Not on file   Family History  Problem  Relation Age of Onset   Heart disease Mother    Alzheimer's disease Mother    Heart attack Father    Heart disease Brother    Stroke Brother    Cancer Neg Hx    Diabetes Neg Hx    Colon cancer Neg Hx    Esophageal cancer Neg Hx    Stomach cancer Neg Hx    Pancreatic cancer Neg Hx     Current Outpatient Medications:    acetaminophen  (TYLENOL ) 500 MG tablet, Take 500-1,000 mg by mouth every 6 (six) hours as needed for moderate pain (pain score 4-6)., Disp: , Rfl:    acyclovir  (ZOVIRAX ) 400 MG tablet, Take 1 tablet (400 mg total) by mouth daily., Disp: 30 tablet, Rfl: 3   allopurinol  (ZYLOPRIM ) 300 MG tablet, Take 1 tablet (300 mg total) by mouth daily., Disp: 30 tablet, Rfl: 3   calcium  carbonate (TUMS - DOSED IN MG ELEMENTAL CALCIUM ) 500 MG chewable tablet, Chew 2 tablets (400 mg of elemental calcium  total) by mouth daily., Disp: , Rfl:    docusate sodium  (COLACE) 100 MG capsule, Take 1 capsule (100 mg total) by mouth 2 (two) times daily., Disp: 10 capsule, Rfl: 0   Evolocumab  (REPATHA  SURECLICK) 140 MG/ML SOAJ, Inject 140 mg into the skin every 14 (fourteen) days., Disp: 2 mL, Rfl: 11   lidocaine -prilocaine  (EMLA ) cream, Apply to affected area once, Disp: 30 g, Rfl: 3   losartan  (COZAAR ) 50 MG tablet, Take 1 tablet (50 mg total) by mouth daily., Disp: 90 tablet, Rfl: 3   magic mouthwash (multi-ingredient) oral suspension, Swish and spit 5-10 mLs 4 (four) times daily as needed for mouth pain., Disp: 480 mL, Rfl: 1   metoprolol  succinate (TOPROL -XL) 25 MG 24 hr tablet, Take 0.5 tablets (12.5 mg total) by mouth daily., Disp: 45 tablet, Rfl: 3   nitroGLYCERIN  (NITROSTAT ) 0.4 MG SL tablet, Place 0.4 mg under the tongue every 5 (five) minutes as needed for chest pain., Disp: , Rfl:    omeprazole  (PRILOSEC) 20 MG capsule, Take 1 capsule (20 mg total) by mouth 2 (two) times daily before a meal., Disp: 120 capsule, Rfl: 0   ondansetron  (ZOFRAN ) 8 MG tablet, Take 1 tablet (8 mg total) by mouth every  8 (eight) hours as needed for nausea or vomiting. Start on the third day after cyclophosphamide  chemotherapy., Disp: 30 tablet, Rfl: 1   predniSONE  (DELTASONE ) 50 MG tablet, Take 2 tablets (100 mg total) by mouth See admin instructions. Take 100mg   daily on Day 2-5 of chemotherapy., Disp: , Rfl:    prochlorperazine  (COMPAZINE ) 10 MG tablet, Take 1 tablet (10 mg total) by mouth every 6 (six) hours as needed for nausea or vomiting., Disp: 30 tablet, Rfl: 6   tadalafil  (CIALIS ) 10 MG tablet, Take 1-2 tablets (10-20 mg total) by mouth daily as needed for erectile dysfunction (take 1 hour prior to sexual activity)., Disp: 30 tablet, Rfl: 11   traMADol  (ULTRAM ) 50 MG tablet, Take 1 tablet (50 mg total) by mouth every 6 (six) hours., Disp: 30 tablet, Rfl: 0  Physical exam:  Vitals:   08/08/24 1556  BP: 130/85  Pulse: 62  Temp: 98.7 F (37.1 C)  TempSrc: Oral  SpO2: 100%  Weight: 135 lb (61.2 kg)   Physical Exam Vitals reviewed.  Constitutional:      General: He is not in acute distress.    Appearance: He is well-developed.  HENT:     Head: Normocephalic and atraumatic.     Mouth/Throat:     Mouth: Mucous membranes are moist.     Pharynx: No oropharyngeal exudate or posterior oropharyngeal erythema.  Eyes:     General: No scleral icterus. Cardiovascular:     Rate and Rhythm: Normal rate and regular rhythm.  Pulmonary:     Effort: Pulmonary effort is normal.  Abdominal:     Palpations: Abdomen is soft.  Skin:    General: Skin is warm and dry.     Coloration: Skin is not pale.  Neurological:     Mental Status: He is alert and oriented to person, place, and time.  Psychiatric:        Mood and Affect: Mood normal.        Behavior: Behavior normal.     Assessment and plan- Patient is a 83 y.o. male currently receiving R-CHOP chemotherapy for lymphoma who presents to symptom management clinic for complaints of:    Hematemesis- scant spots of bleeding. Not on blood thinners. Previously  underwent EGD with GI and was treated for esophageal candidiasis. Recurrent symptoms. High risk of recurrent candidiasis in setting of prednisone  for his lymphoma. Recommend retreating and he [place] need suppressive therapy through lymphoma treatment. Start fluconazole  400 mg x 1 then 200 mg daily.  Constipation- likely induced by medications he receives as part of his treatments. Recommend senna 8.6 mg 1-2 tablets 1-2 times a day. Add miralax 1-3 times a day . Movement, fluids as tolerated. Reviewed use of mag citrate vs milk of magnesia vs miralax q80min with senna for constipation > 2 days.  Limited mobility- generalized weakness and reduced mobility secondary to comorbidities, age, and chemotherapy. Completed handicapped dmv application for patient.   Visit Diagnosis 1. Constipation, unspecified constipation type   2. Esophageal candidiasis (HCC)     Patient expressed understanding and was in agreement with this plan. He also understands that He can call clinic at any time with any questions, concerns, or complaints.   Thank you for allowing me to participate in the care of this very pleasant patient.   Tinnie Dawn, DNP, AGNP-C, AOCNP [facility] [phone]

## 2024-08-08 NOTE — Progress Notes (Signed)
 Patient has been unable to have a bowel movement in about 5 days, so he has been taking stool softners, drinking and eating prunes, finally this morning he was able to relive himself. Still coughing up some brown mucus with some blood specks most of the time it is after he tries to eat.  He wants to know what foods he can eat and what he can do to prevent this from happening again.

## 2024-08-12 ENCOUNTER — Other Ambulatory Visit: Payer: Self-pay | Admitting: Gastroenterology

## 2024-08-14 ENCOUNTER — Ambulatory Visit

## 2024-08-14 MED FILL — Sodium Chloride Preservative Free (PF) Inj 0.9%: INTRAMUSCULAR | Qty: 10 | Status: AC

## 2024-08-14 MED FILL — Hydrocortisone Sodium Succinate PF For Inj 100 MG: INTRAMUSCULAR | Qty: 0.5 | Status: AC

## 2024-08-14 MED FILL — Methotrexate Sodium Inj PF 50 MG/2ML (25 MG/ML): INTRAMUSCULAR | Qty: 0.48 | Status: AC

## 2024-08-15 ENCOUNTER — Encounter: Payer: Self-pay | Admitting: Oncology

## 2024-08-17 ENCOUNTER — Encounter: Payer: Self-pay | Admitting: Oncology

## 2024-08-17 DIAGNOSIS — K222 Esophageal obstruction: Secondary | ICD-10-CM | POA: Insufficient documentation

## 2024-08-18 ENCOUNTER — Ambulatory Visit
Admission: RE | Admit: 2024-08-18 | Discharge: 2024-08-18 | Disposition: A | Discharge status: Home / Self Care | Source: Ambulatory Visit | Attending: Oncology | Admitting: Oncology

## 2024-08-18 DIAGNOSIS — R918 Other nonspecific abnormal finding of lung field: Secondary | ICD-10-CM | POA: Diagnosis not present

## 2024-08-18 DIAGNOSIS — C8580 Other specified types of non-Hodgkin lymphoma, unspecified site: Secondary | ICD-10-CM | POA: Diagnosis present

## 2024-08-18 DIAGNOSIS — I700 Atherosclerosis of aorta: Secondary | ICD-10-CM | POA: Insufficient documentation

## 2024-08-18 DIAGNOSIS — I2510 Atherosclerotic heart disease of native coronary artery without angina pectoris: Secondary | ICD-10-CM | POA: Insufficient documentation

## 2024-08-18 DIAGNOSIS — K861 Other chronic pancreatitis: Secondary | ICD-10-CM | POA: Diagnosis not present

## 2024-08-18 DIAGNOSIS — K8689 Other specified diseases of pancreas: Secondary | ICD-10-CM | POA: Insufficient documentation

## 2024-08-18 LAB — GLUCOSE, CAPILLARY: Glucose-Capillary: 85 mg/dL (ref 70–99)

## 2024-08-18 MED ORDER — FLUDEOXYGLUCOSE F - 18 (FDG) INJECTION
7.2600 | Freq: Once | INTRAVENOUS | Status: AC | PRN
  Administered 2024-08-18: 7.26 via INTRAVENOUS

## 2024-08-22 ENCOUNTER — Inpatient Hospital Stay

## 2024-08-22 ENCOUNTER — Inpatient Hospital Stay (HOSPITAL_BASED_OUTPATIENT_CLINIC_OR_DEPARTMENT_OTHER): Admitting: Oncology

## 2024-08-22 ENCOUNTER — Inpatient Hospital Stay: Attending: Oncology

## 2024-08-22 ENCOUNTER — Encounter: Payer: Self-pay | Admitting: Oncology

## 2024-08-22 VITALS — BP 126/56 | HR 56 | Temp 97.3°F | Resp 20 | Wt 131.9 lb

## 2024-08-22 VITALS — BP 109/54 | HR 64 | Temp 97.1°F | Resp 18

## 2024-08-22 DIAGNOSIS — Z5111 Encounter for antineoplastic chemotherapy: Secondary | ICD-10-CM | POA: Insufficient documentation

## 2024-08-22 DIAGNOSIS — C83398 Diffuse large b-cell lymphoma of other extranodal and solid organ sites: Secondary | ICD-10-CM | POA: Insufficient documentation

## 2024-08-22 DIAGNOSIS — R748 Abnormal levels of other serum enzymes: Secondary | ICD-10-CM

## 2024-08-22 DIAGNOSIS — E871 Hypo-osmolality and hyponatremia: Secondary | ICD-10-CM | POA: Diagnosis not present

## 2024-08-22 DIAGNOSIS — Z5112 Encounter for antineoplastic immunotherapy: Secondary | ICD-10-CM | POA: Insufficient documentation

## 2024-08-22 DIAGNOSIS — C8580 Other specified types of non-Hodgkin lymphoma, unspecified site: Secondary | ICD-10-CM

## 2024-08-22 DIAGNOSIS — Z5189 Encounter for other specified aftercare: Secondary | ICD-10-CM | POA: Insufficient documentation

## 2024-08-22 DIAGNOSIS — K5900 Constipation, unspecified: Secondary | ICD-10-CM | POA: Diagnosis not present

## 2024-08-22 DIAGNOSIS — Z9221 Personal history of antineoplastic chemotherapy: Secondary | ICD-10-CM | POA: Diagnosis not present

## 2024-08-22 DIAGNOSIS — K5903 Drug induced constipation: Secondary | ICD-10-CM

## 2024-08-22 LAB — CBC WITH DIFFERENTIAL (CANCER CENTER ONLY)
Abs Immature Granulocytes: 0.25 K/uL — ABNORMAL HIGH (ref 0.00–0.07)
Basophils Absolute: 0.1 K/uL (ref 0.0–0.1)
Basophils Relative: 1 %
Eosinophils Absolute: 0 K/uL (ref 0.0–0.5)
Eosinophils Relative: 0 %
HCT: 31.7 % — ABNORMAL LOW (ref 39.0–52.0)
Hemoglobin: 11 g/dL — ABNORMAL LOW (ref 13.0–17.0)
Immature Granulocytes: 4 %
Lymphocytes Relative: 17 %
Lymphs Abs: 1.1 K/uL (ref 0.7–4.0)
MCH: 31.5 pg (ref 26.0–34.0)
MCHC: 34.7 g/dL (ref 30.0–36.0)
MCV: 90.8 fL (ref 80.0–100.0)
Monocytes Absolute: 0.8 K/uL (ref 0.1–1.0)
Monocytes Relative: 13 %
Neutro Abs: 4.2 K/uL (ref 1.7–7.7)
Neutrophils Relative %: 65 %
Platelet Count: 205 K/uL (ref 150–400)
RBC: 3.49 MIL/uL — ABNORMAL LOW (ref 4.22–5.81)
RDW: 17.1 % — ABNORMAL HIGH (ref 11.5–15.5)
WBC Count: 6.5 K/uL (ref 4.0–10.5)
nRBC: 0 % (ref 0.0–0.2)

## 2024-08-22 LAB — CMP (CANCER CENTER ONLY)
ALT: 20 U/L (ref 0–44)
AST: 23 U/L (ref 15–41)
Albumin: 4.1 g/dL (ref 3.5–5.0)
Alkaline Phosphatase: 134 U/L — ABNORMAL HIGH (ref 38–126)
Anion gap: 8 (ref 5–15)
BUN: 11 mg/dL (ref 8–23)
CO2: 24 mmol/L (ref 22–32)
Calcium: 8.8 mg/dL — ABNORMAL LOW (ref 8.9–10.3)
Chloride: 96 mmol/L — ABNORMAL LOW (ref 98–111)
Creatinine: 0.66 mg/dL (ref 0.61–1.24)
GFR, Estimated: >60 mL/min (ref >60)
Glucose, Bld: 110 mg/dL — ABNORMAL HIGH (ref 70–99)
Potassium: 3.9 mmol/L (ref 3.5–5.1)
Sodium: 128 mmol/L — ABNORMAL LOW (ref 135–145)
Total Bilirubin: 0.8 mg/dL (ref 0.0–1.2)
Total Protein: 6.5 g/dL (ref 6.5–8.1)

## 2024-08-22 MED ORDER — SODIUM CHLORIDE 0.9 % IV SOLN
400.0000 mg/m2 | Freq: Once | INTRAVENOUS | Status: AC
  Administered 2024-08-22: 660 mg via INTRAVENOUS
  Filled 2024-08-22: qty 33

## 2024-08-22 MED ORDER — APREPITANT 130 MG/18ML IV EMUL
130.0000 mg | Freq: Once | INTRAVENOUS | Status: AC
  Administered 2024-08-22: 130 mg via INTRAVENOUS
  Filled 2024-08-22: qty 18

## 2024-08-22 MED ORDER — SODIUM CHLORIDE 0.9 % IV SOLN
INTRAVENOUS | Status: DC
  Filled 2024-08-22: qty 250

## 2024-08-22 MED ORDER — SODIUM CHLORIDE 0.9 % IV SOLN
375.0000 mg/m2 | Freq: Once | INTRAVENOUS | Status: AC
  Administered 2024-08-22: 600 mg via INTRAVENOUS
  Filled 2024-08-22: qty 50

## 2024-08-22 MED ORDER — SODIUM CHLORIDE (PF) 0.9 % IJ SOLN
Freq: Once | INTRAMUSCULAR | Status: AC
  Filled 2024-08-22: qty 0.48

## 2024-08-22 MED ORDER — VINCRISTINE SULFATE CHEMO INJECTION 1 MG/ML
1.0000 mg | Freq: Once | INTRAVENOUS | Status: AC
  Administered 2024-08-22: 1 mg via INTRAVENOUS
  Filled 2024-08-22: qty 1

## 2024-08-22 MED ORDER — DIPHENHYDRAMINE HCL 25 MG PO CAPS
50.0000 mg | ORAL_CAPSULE | Freq: Once | ORAL | Status: AC
  Administered 2024-08-22: 50 mg via ORAL
  Filled 2024-08-22: qty 2

## 2024-08-22 MED ORDER — ACETAMINOPHEN 325 MG PO TABS
650.0000 mg | ORAL_TABLET | Freq: Once | ORAL | Status: AC
  Administered 2024-08-22: 650 mg via ORAL
  Filled 2024-08-22: qty 2

## 2024-08-22 MED ORDER — PALONOSETRON HCL INJECTION 0.25 MG/5ML
0.2500 mg | Freq: Once | INTRAVENOUS | Status: AC
  Administered 2024-08-22: 0.25 mg via INTRAVENOUS
  Filled 2024-08-22: qty 5

## 2024-08-22 MED ORDER — DOXORUBICIN HCL CHEMO IV INJECTION 2 MG/ML
25.0000 mg/m2 | Freq: Once | INTRAVENOUS | Status: AC
  Administered 2024-08-22: 42 mg via INTRAVENOUS
  Filled 2024-08-22: qty 21

## 2024-08-22 MED ORDER — SODIUM CHLORIDE 0.9 % IV SOLN
15.0000 mg | Freq: Once | INTRAVENOUS | Status: AC
  Administered 2024-08-22: 15 mg via INTRAVENOUS
  Filled 2024-08-22: qty 1.5

## 2024-08-22 NOTE — Assessment & Plan Note (Signed)
 Chemotherapy as listed above.

## 2024-08-22 NOTE — Assessment & Plan Note (Addendum)
 Stable, monitor.

## 2024-08-22 NOTE — Patient Instructions (Signed)
 [facility] - A DEPT OF [facility]  Discharge Instructions: Thank you for choosing [facility] to provide your oncology and hematology care.  If you have a lab appointment with the Cancer Center, please go directly to the Cancer Center and check in at the registration area.  Wear comfortable clothing and clothing appropriate for easy access to any Portacath or PICC line.   We strive to give you quality time with your provider. You Heenan need to reschedule your appointment if you arrive late (15 or more minutes).  Arriving late affects you and other patients whose appointments are after yours.  Also, if you miss three or more appointments without notifying the office, you Cromartie be dismissed from the clinic at the provider's discretion.      For prescription refill requests, have your pharmacy contact our office and allow 72 hours for refills to be completed.    Today you received the following chemotherapy and/or immunotherapy agents Adriamycin , Oncovin , Cytoxan  & Ruxience       To help prevent nausea and vomiting after your treatment, we encourage you to take your nausea medication as directed.  BELOW ARE SYMPTOMS THAT SHOULD BE REPORTED IMMEDIATELY: *FEVER GREATER THAN 100.4 F (38 C) OR HIGHER *CHILLS OR SWEATING *NAUSEA AND VOMITING THAT IS NOT CONTROLLED WITH YOUR NAUSEA MEDICATION *UNUSUAL SHORTNESS OF BREATH *UNUSUAL BRUISING OR BLEEDING *URINARY PROBLEMS (pain or burning when urinating, or frequent urination) *BOWEL PROBLEMS (unusual diarrhea, constipation, pain near the anus) TENDERNESS IN MOUTH AND THROAT WITH OR WITHOUT PRESENCE OF ULCERS (sore throat, sores in mouth, or a toothache) UNUSUAL RASH, SWELLING OR PAIN  UNUSUAL VAGINAL DISCHARGE OR ITCHING   Items with * indicate a potential emergency and should be followed up as soon as possible or go to the Emergency Department if any problems should occur.  Please show the CHEMOTHERAPY  ALERT CARD or IMMUNOTHERAPY ALERT CARD at check-in to the Emergency Department and triage nurse.  Should you have questions after your visit or need to cancel or reschedule your appointment, please contact [facility] - A DEPT OF [facility]  [phone] and follow the prompts.  Office hours are 8:00 a.m. to 4:30 p.m. Monday - Friday. Please note that voicemails left after 4:00 p.m. Minasyan not be returned until the following business day.  We are closed weekends and major holidays. You have access to a nurse at all times for urgent questions. Please call the main number to the clinic [phone] and follow the prompts.  For any non-urgent questions, you Fridman also contact your provider using MyChart. We now offer e-Visits for anyone 83 and older to request care online for non-urgent symptoms. For details visit [email or web address].   Also download the MyChart app! Go to the app store, search MyChart, open the app, select , and log in with your MyChart username and password.

## 2024-08-22 NOTE — Assessment & Plan Note (Addendum)
 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  Ki-67 70%,  Right orchiectomy pathology results were reviewed and discussed with patient. Recommend R Mini CHOP, intrathecal chemotherapy with methotrexate , radiation.  Prechemo MUGA showed LVEF 51%. FISH Bcl-2/BCL6/c-Myc are all negative.  Labs are reviewed and discussed with patient. PET3 showed partial response - improved but residual testicular activity, small bilateral hilar/infrahilar lymph nodes  Proceed with cycle 4 R - Mini CHOP.  He gets Dexamethasone  on D1 and he takes prednisone  100mg  D2-5.  intrathecal methotrexate  on D2.  D3 GCSF - recommend claritin 10mg  daily for 4 days.

## 2024-08-22 NOTE — Assessment & Plan Note (Signed)
 We discussed about bowel regimen.  Recommend Senokot 2 tab daily, Miralax 17g daily PRN.

## 2024-08-22 NOTE — Assessment & Plan Note (Signed)
 Intermittent chronic issue for him.  He is asymptomatic.  Patient Battaglini liberate salt intake Serum osmolarity is normal.

## 2024-08-22 NOTE — Progress Notes (Signed)
 Nutrition Follow-up:  Patient with b cell lymphoma.  Receiving R mini CHOP.  S/p EGD on 7/10  Met with patient during infusion.  Reports this past week eating well and bowels moving.  Reports first week after treatment appetite is low but then starts to return.  Knows to take senna and miralax for constipation    Medications: reviewed  Labs: reviewed  Anthropometrics:   Weight 131 lb 14.4 oz today 132 lb 4.8 oz on 8/11 129 lb 5 oz on 7/15    NUTRITION DIAGNOSIS:  Inadequate oral intake improved    INTERVENTION:  Continue well balanced diet including plant foods. Continue oral nutrition supplements Continue bowel regimen    MONITORING, EVALUATION, GOAL: weight trends, intake   NEXT VISIT: Tuesday, Oct 14  during infusion  Javel Hersh B. Dasie SOLON, CSO, LDN Registered Dietitian [phone]

## 2024-08-22 NOTE — Progress Notes (Signed)
 Patient for DG LP Methotrexate  Inj on Wed 08/23/24, I called and spoke with the patient on the phone and gave pre-procedure instructions. Pt was made aware to be here at 9:30a and check in at the new entrance. Pt stated understanding. Called 08/22/24

## 2024-08-22 NOTE — Progress Notes (Signed)
 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  Ki-67 70%,  Right orchiectomy pathology results were reviewed and discussed with patient. Recommend R Mini CHOP, intrathecal chemotherapy with methotrexate , radiation.  Prechemo MUGA showed LVEF 51%. FISH Bcl-2/BCL6/c-Myc are all negative.  Labs are reviewed and discussed with patient. PET3 showed partial response - improved but residual testicular activity, small bilateral hilar/infrahilar lymph nodes  Proceed with cycle 4 R - Mini CHOP.  He gets Dexamethasone  on D1 and he takes prednisone  100mg  D2-5.  intrathecal methotrexate  on D2.  D3 GCSF - recommend claritin 10mg  daily for 4 days.     Encounter for antineoplastic chemotherapy Chemotherapy as listed above.   Hyponatremia Intermittent chronic issue for him.  He is asymptomatic.   Patient Ericsson liberate salt intake Serum osmolarity is normal.  Elevated alkaline phosphatase level Stable, monitor.   Hypocalcemia Recommend patient to take calcium  1200 mg daily.  Take vitamin D supplementation.   Constipation We discussed about bowel regimen.  Recommend Senokot 2 tab daily, Miralax 17g daily PRN.    Orders Placed This Encounter  Procedures   DG FL LUMBAR PUNCTURE W/CHEMO INJECT    Need flow cytometry collected    Standing Status:   Future    Expected Date:   09/13/2024    Expiration Date:   08/22/2025    Lab orders requested (DO NOT place separate lab orders, these will be automatically ordered during procedure specimen  collection)::   Other    Reason for Exam (SYMPTOM  OR DIAGNOSIS REQUIRED):   lymphoma; intrathecal methotrexate     Preferred Imaging Location?:   [facility]   Follow up 3 weeks All questions were answered. The patient knows to call the clinic with any problems, questions or concerns.  Call Babara, MD, PhD [facility] Hematology Oncology 08/22/2024    HISTORY OF PRESENTING ILLNESS:  [name] 82 y.o. male presents to establish care for testicular lymphoma.   Oncology History  Lymphoma, large cell (HCC)  04/25/2024 Imaging   ultrasound scrotum with Doppler showed Diffusely heterogeneous right testicular echotexture, most consistent with dominant mass or masses. Multiple left testicular masses. Favor lymphoproliferative process such as lymphoma or leukemia. Bilateral primary testicular neoplasms felt less likely.   Right epididymal enlargement and heterogeneity, favoring epididymitis. The appearance of the testicles is felt unlikely to be be infectious/inflammatory.   04/28/2024 Imaging   CT chest with contrast abdomen pelvis with and without contrast showed 1. Heterogeneous enhancement in both testicles with substantial abnormal enlargement of the right testicle. This is suspicious for testicular neoplasm with lymphoblastic leukemia, lymphoma, or conceivably metastatic disease as top differential diagnostic considerations. Testicular adrenal rest tumors are a differential diagnostic consideration. 2. No overtly pathologic adenopathy identified. 3. Severe parenchymal atrophy of the pancreatic tail and body probably due to a 1.4 cm calcification/stone along the dorsal pancreatic duct at the junction of the body and head. Similar severe atrophy was shown on the CT chest from 01/07/2020. 4. Branching density in the superior segment right lower lobe favoring bronchiectasis with airway plugging. 5. Small type 1 hiatal  hernia. 6. Prominent stool throughout the colon favors  constipation. 7. Chronic appearing anterior wedge compressions at L1 and L2. 8. Lumbar spondylosis and degenerative disc disease at L5-S1 resulting in mild bilateral foraminal impingement. 9.  Aortic Atherosclerosis (ICD10-I70.0). 10. Nonspecific lesion peripherally in the right hepatic lobe. This could be further characterized with hepatic protocol MRI with and without contrast.   05/04/2024 Initial Diagnosis   Testicular lymphoma, large cell   He has experienced scrotal swelling since a hydrocele repair in 2022. The swelling decreased slightly post-surgery but never fully resolved. Recently, he received a report indicating concerns in both testicles, prompting further evaluation.    He reports neck soreness and swelling, initially attributed to physical activity such as painting and building a fence. The neck soreness is described as 'almost miserable.'   He mentions unintentional weight loss, noting a decrease from his usual weight of 135-140 pounds to 122 pounds over the past year. Despite efforts to regain weight, including going on cruises, he has not been successful. He currently weighs 133 pounds with clothes on.   No excessive night sweats. He feels cold most of the time but has experienced some fever. No family history of cancer but there is a family history of high cholesterol and heart problems. He has not had a PSA test but underwent a colonoscopy about three years ago.    05/24/2024 Cancer Staging   Staging form: Hodgkin and Non-Hodgkin Lymphoma, AJCC 8th Edition - Clinical stage from 05/24/2024: Stage III (Diffuse large B-cell lymphoma) - Signed by Babara Call, MD on 06/01/2024 Stage prefix: Initial diagnosis   06/16/2024 -  Chemotherapy   Patient is on Treatment Plan : NON-HODGKINS LYMPHOMA R-CHOP q21d     08/18/2024 Imaging   PET scan showed  1. Interval right orchiectomy with small amount of residual Deauville 4 activity in the right scrotal sac, nonspecific. 2. Small but  hypermetabolic bilateral hilar/infrahilar lymph nodes, Deauville 4 on the right and Deauville 5 on the left. 3. Small faintly hypermetabolic AP window lymph node, Deauville 3. 4. Bandlike nodularity in the superior segment right lower lobe is unchanged and demonstrates only low-grade metabolic activity. Surveillance suggested. 5. Chronic calcific pancreatitis with pancreatic atrophy. 6. Aortic Atherosclerosis (ICD10-I70.0). Coronary and systemic atherosclerosis.   S/p EGD, stretch of stenosis, on Fluconazole  for esophageal candidiasis   Today patient reports feeling well.  He tolerated 3 cycles of R mini CHOP.  Status post 2cycle of intrathecal methotrexate .  He had some mild back pain.  Denies any headache, nausea or vomiting.  Oral intake has improved.  No swallowing difficulties. Weight is stable. + constipation after chemotherapy.       MEDICAL HISTORY:  Past Medical History:  Diagnosis Date   Arthritis    Bilateral pleural effusion    Bone spur 2010   right shoulder   CAD (coronary artery disease)    a. 12/2019 NSTEMI/Cath: LM 99ost/m, LAD 40, LCX nl, OM1 80, RCA 70ost/p, 99p, 30d, EF 45-50%; b. 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   Complication of anesthesia    a.) delayed emergence; b.) emergence delirium   Diverticulosis    GERD (gastroesophageal reflux disease)    H/O atrial flutter    HFrEF (heart failure with reduced ejection fraction) (HCC)    a.) TEE 01/08/2020: EF 45-50%; b.) TTE 04/14/2022: EF 50-55%, septal HK, G2DD, GLS /18.3%, mild-mod MR, AoV sclerosis without stenosis.   Hyperlipidemia    Hypertension    Hyponatremia    IBS (irritable  bowel syndrome)    Ischemic cardiomyopathy    a. 12/2019 TEE: EF 45-50%. Nl RV fxn. Mild MR.   NSTEMI (non-ST elevated myocardial infarction) (HCC) 01/07/2020   a.) LHC 01/08/2020: EF 45-50%, LVEDP 22 mmHg; 99% o-mLM, 80% OM1, 40% mLAD, 70% o-pRCA, 99% pRCA, 30% dRCA --> consult CVTS; b.) 4v CABG 01/07/2022:  LIMA-LAD, SVG-RPDA, SVG-OM1, SVG-OM2.   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   Skin cancer    a.) s/p Mohs surgery   Statin intolerance    Testicular mass     SURGICAL HISTORY: Past Surgical History:  Procedure Laterality Date   BACK SURGERY     Lumbar   BALLOON DILATION N/A 06/29/2024   Procedure: BALLOON DILATION;  Surgeon: Stacia Glendia BRAVO, MD;  Location: [facility] ENDOSCOPY;  Service: Gastroenterology;  Laterality: N/A;   BUNIONECTOMY     CATARACT EXTRACTION, BILATERAL     COLONOSCOPY     CORONARY ARTERY BYPASS GRAFT N/A 01/08/2020   Procedure: CORONARY ARTERY BYPASS GRAFTING (CABG) x 4  WITH ENDOSCOPIC HARVESTING OF BILATERAL GREATER SAPHENOUS VEINS;  Surgeon: Fleeta Hanford Coy, MD;  Location: [facility] OR;  Service: Open Heart Surgery;  Laterality: N/A;   ESOPHAGOGASTRODUODENOSCOPY N/A 06/29/2024   Procedure: EGD (ESOPHAGOGASTRODUODENOSCOPY);  Surgeon: Stacia Glendia BRAVO, MD;  Location: [facility] ENDOSCOPY;  Service: Gastroenterology;  Laterality: N/A;   HEMORRHOID SURGERY  2004   HYDROCELE EXCISION     INGUINAL HERNIA REPAIR  01/2010   IR IMAGING GUIDED PORT INSERTION  06/02/2024   LEFT HEART CATH AND CORONARY ANGIOGRAPHY N/A 01/08/2020   Procedure: LEFT HEART CATH AND CORONARY ANGIOGRAPHY;  Surgeon: Darron Deatrice LABOR, MD;  Location: [facility] INVASIVE CV LAB;  Service: Cardiovascular;  Laterality: N/A;   ORCHIECTOMY Right 05/12/2024   Procedure: ORCHIECTOMY;  Surgeon: Francisca Redell BROCKS, MD;  Location: [facility] ORS;  Service: Urology;  Laterality: Right;   ROTATOR CUFF REPAIR Left 06/2010   left/ bone spur Dr.Blackman   SHOULDER ARTHROSCOPY WITH OPEN ROTATOR CUFF REPAIR Right 05/26/2018   Procedure: SHOULDER ARTHROSCOPY WITH OPEN ROTATOR CUFF REPAIR;  Surgeon: Edie Norleen PARAS, MD;  Location: [facility] ORS;   Service: Orthopedics;  Laterality: Right;  debridement, decompression   SKIN CANCER EXCISION  2003-2005   Mohs-right shoulder   TEE WITHOUT CARDIOVERSION N/A 01/08/2020   Procedure: TRANSESOPHAGEAL ECHOCARDIOGRAM (TEE);  Surgeon: Fleeta Hanford, Coy, MD;  Location: [facility] OR;  Service: Open Heart Surgery;  Laterality: N/A;   TOTAL HIP ARTHROPLASTY Right 07/14/2022   Procedure: TOTAL HIP ARTHROPLASTY ANTERIOR APPROACH;  Surgeon: Kathlynn Sharper, MD;  Location: [facility] ORS;  Service: Orthopedics;  Laterality: Right;    SOCIAL HISTORY: Social History   Socioeconomic History   Marital status: Married    Spouse name: Not on file   Number of children: 2   Years of education: Not on file   Highest education level: Not on file  Occupational History   Occupation: retired Academic librarian: retired  Tobacco Use   Smoking status: Never   Smokeless tobacco: Never  Vaping Use   Vaping status: Never Used  Substance and Sexual Activity   Alcohol use: Not Currently    Alcohol/week: 1.0 standard drink of alcohol    Types: 1 Glasses of wine per week    Comment:  very rare   Drug use: No   Sexual activity: Not on file  Other Topics Concern   Not on file  Social History Narrative   2 sons, no contact   Splits his time between [place]  and [place]    Has live-in since 2005   Has living will   Requests Bernice--girlfriend or brother Lamar, as health care POA   Would accept resuscitation but no prolonged artificial ventilation   Wouldn't want tube feeds if cognitively unaware   Social Drivers of Health   Financial Resource Strain: Low Risk  (08/01/2024)   Received [facility]   Overall Financial Resource Strain (CARDIA)    Difficulty of Paying Living Expenses: Not hard at all  Food Insecurity: No Food Insecurity (08/01/2024)   Received from [facility]   Hunger Vital Sign    Within the past 12 months, you worried that your food  would run out before you got the money to buy more.: Never true    Within the past 12 months, the food you bought just didn't last and you didn't have money to get more.: Never true  Transportation Needs: No Transportation Needs (08/01/2024)   Received from [facility] - Transportation    In the past 12 months, has lack of transportation kept you from medical appointments or from getting medications?: No    Lack of Transportation (Non-Medical): No  Physical Activity: Not on file  Stress: Not on file  Social Connections: Not on file  Intimate Partner Violence: Not on file    FAMILY HISTORY: Family History  Problem Relation Age of Onset   Heart disease Mother    Alzheimer's disease Mother    Heart attack Father    Heart disease Brother    Stroke Brother    Cancer Neg Hx    Diabetes Neg Hx    Colon cancer Neg Hx    Esophageal cancer Neg Hx    Stomach cancer Neg Hx    Pancreatic cancer Neg Hx     ALLERGIES:  is allergic to crestor [rosuvastatin], penicillins, peanut-containing drug products, atorvastatin, and simvastatin.  MEDICATIONS:  Current Outpatient Medications  Medication Sig Dispense Refill   acetaminophen  (TYLENOL ) 500 MG tablet Take 500-1,000 mg by mouth every 6 (six) hours as needed for moderate pain (pain score 4-6).     acyclovir  (ZOVIRAX ) 400 MG tablet Take 1 tablet (400 mg total) by mouth daily. 30 tablet 3   allopurinol  (ZYLOPRIM ) 300 MG tablet Take 1 tablet (300 mg total) by mouth daily. 30 tablet 3   calcium  carbonate (TUMS - DOSED IN MG ELEMENTAL CALCIUM ) 500 MG chewable tablet Chew 2 tablets (400 mg of elemental calcium  total) by mouth daily.     docusate sodium  (COLACE) 100 MG capsule Take 1 capsule (100 mg total) by mouth 2 (two) times daily. 10 capsule 0   Evolocumab  (REPATHA  SURECLICK) 140 MG/ML SOAJ Inject 140 mg into the skin every 14 (fourteen) days. 2 mL 11   fluconazole  (DIFLUCAN ) 200 MG tablet Take 2 tablets (400 mg total)  by mouth daily for 1 day, THEN 1 tablet (200 mg total) daily for 28 days. 30 tablet 0   lidocaine -prilocaine  (EMLA ) cream Apply to affected area once 30 g 3   losartan  (COZAAR ) 50 MG tablet Take 1 tablet (50 mg total) by mouth daily. 90 tablet 3   magic mouthwash (multi-ingredient) oral suspension Swish and spit 5-10 mLs 4 (four) times daily  as needed for mouth pain. 480 mL 1   metoprolol  succinate (TOPROL -XL) 25 MG 24 hr tablet Take 0.5 tablets (12.5 mg total) by mouth daily. 45 tablet 3   nitroGLYCERIN  (NITROSTAT ) 0.4 MG SL tablet Place 0.4 mg under the tongue every 5 (five) minutes as needed for chest pain.     omeprazole  (PRILOSEC) 20 MG capsule Take 1 capsule (20 mg total) by mouth daily. 90 capsule 1   ondansetron  (ZOFRAN ) 8 MG tablet Take 1 tablet (8 mg total) by mouth every 8 (eight) hours as needed for nausea or vomiting. Start on the third day after cyclophosphamide  chemotherapy. 30 tablet 1   predniSONE  (DELTASONE ) 50 MG tablet Take 2 tablets (100 mg total) by mouth See admin instructions. Take 100mg  daily on Day 2-5 of chemotherapy.     prochlorperazine  (COMPAZINE ) 10 MG tablet Take 1 tablet (10 mg total) by mouth every 6 (six) hours as needed for nausea or vomiting. 30 tablet 6   tadalafil  (CIALIS ) 10 MG tablet Take 1-2 tablets (10-20 mg total) by mouth daily as needed for erectile dysfunction (take 1 hour prior to sexual activity). 30 tablet 11   traMADol  (ULTRAM ) 50 MG tablet Take 1 tablet (50 mg total) by mouth every 6 (six) hours. 30 tablet 0   No current facility-administered medications for this visit.   Facility-Administered Medications Ordered in Other Visits  Medication Dose Route Frequency Provider Last Rate Last Admin   0.9 %  sodium chloride  infusion   Intravenous Continuous Babara Call, MD 10 mL/hr at 08/22/24 0917 New Bag at 08/22/24 0917   cyclophosphamide  (CYTOXAN ) 660 mg in sodium chloride  0.9 % 250 mL chemo infusion  400 mg/m2 (Treatment Plan Recorded) Intravenous Once  Babara Call, MD 566 mL/hr at 08/22/24 1056 660 mg at 08/22/24 1056   riTUXimab -pvvr (RUXIENCE ) 600 mg in sodium chloride  0.9 % 250 mL (1.9355 mg/mL) infusion  375 mg/m2 (Treatment Plan Recorded) Intravenous Once Babara Call, MD        Review of Systems  Constitutional:  Positive for fatigue. Negative for appetite change, chills, fever and unexpected weight change.  HENT:   Negative for hearing loss and voice change.   Eyes:  Negative for eye problems and icterus.  Respiratory:  Negative for chest tightness, cough and shortness of breath.   Cardiovascular:  Negative for chest pain and leg swelling.  Gastrointestinal:  Negative for abdominal distention and abdominal pain.  Endocrine: Negative for hot flashes.  Genitourinary:  Negative for difficulty urinating, dysuria and frequency.        Status post right orchiectomy  Musculoskeletal:  Positive for arthralgias and neck pain.  Skin:  Negative for itching and rash.  Neurological:  Negative for light-headedness and numbness.  Hematological:  Negative for adenopathy. Does not bruise/bleed easily.  Psychiatric/Behavioral:  Negative for confusion.      PHYSICAL EXAMINATION: ECOG PERFORMANCE STATUS: 1 - Symptomatic but completely ambulatory  Vitals:   08/22/24 0830  BP: (!) 126/56  Pulse: (!) 56  Resp: 20  Temp: (!) 97.3 F (36.3 C)  SpO2: 100%   Filed Weights   08/22/24 0830  Weight: 131 lb 14.4 oz (59.8 kg)    Physical Exam Constitutional:      General: He is not in acute distress.    Appearance: He is not diaphoretic.  HENT:     Head: Normocephalic and atraumatic.  Eyes:     General: No scleral icterus. Cardiovascular:     Rate and Rhythm: Normal rate and regular  rhythm.     Heart sounds: No murmur heard. Pulmonary:     Effort: Pulmonary effort is normal. No respiratory distress.     Breath sounds: No wheezing.  Abdominal:     General: There is no distension.     Palpations: Abdomen is soft.     Tenderness: There is no  abdominal tenderness.  Musculoskeletal:        General: Normal range of motion.     Cervical back: Normal range of motion and neck supple.  Skin:    General: Skin is warm and dry.     Findings: No erythema.  Neurological:     Mental Status: He is alert and oriented to person, place, and time. Mental status is at baseline.     Motor: No abnormal muscle tone.  Psychiatric:        Mood and Affect: Affect normal.     LABORATORY DATA:  I have reviewed the data as listed    Latest Ref Rng & Units 08/22/2024    7:57 AM 07/31/2024    9:36 AM 07/10/2024    7:57 AM  CBC  WBC 4.0 - 10.5 K/uL 6.5  7.2  12.2   Hemoglobin 13.0 - 17.0 g/dL 88.9  88.9  88.3   Hematocrit 39.0 - 52.0 % 31.7  31.2  34.2   Platelets 150 - 400 K/uL 205  215  302       Latest Ref Rng & Units 08/22/2024    7:57 AM 07/31/2024    9:36 AM 07/10/2024    7:57 AM  CMP  Glucose 70 - 99 mg/dL 889  885  860   BUN 8 - 23 mg/dL 11  10  16    Creatinine 0.61 - 1.24 mg/dL 9.33  9.29  9.26   Sodium 135 - 145 mmol/L 128  127  131   Potassium 3.5 - 5.1 mmol/L 3.9  4.3  4.1   Chloride 98 - 111 mmol/L 96  95  100   CO2 22 - 32 mmol/L 24  23  23    Calcium  8.9 - 10.3 mg/dL 8.8  8.7  8.9   Total Protein 6.5 - 8.1 g/dL 6.5  6.3  6.9   Total Bilirubin 0.0 - 1.2 mg/dL 0.8  0.6  0.6   Alkaline Phos 38 - 126 U/L 134  135  171   AST 15 - 41 U/L 23  22  26    ALT 0 - 44 U/L 20  26  23       RADIOGRAPHIC STUDIES: I have personally reviewed the radiological images as listed and agreed with the findings in the report. NM PET Image Restag (PS) Skull Base To Thigh Result Date: 08/22/2024 CLINICAL DATA:  Subsequent treatment strategy for large-cell lymphoma. EXAM: NUCLEAR MEDICINE PET SKULL BASE TO THIGH TECHNIQUE: 7.3 mCi F-18 FDG was injected intravenously. Full-ring PET imaging was performed from the skull base to thigh after the radiotracer. CT data was obtained and used for attenuation correction and anatomic localization. Fasting blood  glucose: 85 mg/dl COMPARISON:  3/89/7974 FINDINGS: Mediastinal blood pool activity: SUV max 1.7 Liver activity: SUV max 2.6 NECK: No significant abnormal hypermetabolic activity in this region. Incidental CT findings: Bilateral common carotid atheromatous vascular calcifications. CHEST: Small but hypermetabolic bilateral hilar/infrahilar lymph nodes. Right infrahilar node with maximum SUV 4.1 (Deauville 4), previously 3.5. Left infrahilar node with maximum SUV 5.7 (Deauville 5), previously 4.9. Small faintly hypermetabolic AP window lymph node, maximum SUV 2.6 (Deauville 3). Bandlike  nodularity in the superior segment right lower lobe measuring 3 mm in thickness on image 59 series 6, maximum SUV 1.5 and previously 1.6. Incidental CT findings: Right Port-A-Cath tip: Cavoatrial junction. Coronary, aortic arch, and branch vessel atherosclerotic vascular disease. Prior CABG. ABDOMEN/PELVIS: Postoperative findings along the right groin and inguinal region some accentuated metabolic activity including a focus of activity along the right inguinal ring with maximum SUV 3.5 (Deauville 4), previously 4.0. Interval right orchiectomy, small amount of residual accentuated activity in the right scrotal sac with maximum SUV 3.5 (Deauville 4), nonspecific. Left testicle maximum SUV 3.2 (Deauville 4), previously 9.2. No current hypermetabolic adenopathy.  Spleen unremarkable. Incidental CT findings: Atherosclerosis is present, including aortoiliac atherosclerotic disease. Chronic calcific pancreatitis with pancreatic atrophy. Hypodense hepatic lesions are not hypermetabolic, probably cysts or similar benign lesions, unchanged. Mild prostatomegaly. SKELETON: Mild diffusely accentuated skeletal activity likely granulocyte stimulation. Incidental CT findings: Spondylosis.  Right hip prosthesis. IMPRESSION: 1. Interval right orchiectomy with small amount of residual Deauville 4 activity in the right scrotal sac, nonspecific. 2. Small  but hypermetabolic bilateral hilar/infrahilar lymph nodes, Deauville 4 on the right and Deauville 5 on the left. 3. Small faintly hypermetabolic AP window lymph node, Deauville 3. 4. Bandlike nodularity in the superior segment right lower lobe is unchanged and demonstrates only low-grade metabolic activity. Surveillance suggested. 5. Chronic calcific pancreatitis with pancreatic atrophy. 6. Aortic Atherosclerosis (ICD10-I70.0). Coronary and systemic atherosclerosis. Electronically Signed   By: Ryan Salvage M.D.   On: 08/22/2024 08:48   [facility] LUMBAR PUNCTURE W/CHEMO INJECT Addendum Date: 08/22/2024 ADDENDUM REPORT: 08/22/2024 08:38 ADDENDUM: Initial lumbar puncture was performed by Sherrilee Bal, PA-C. Subsequent methotrexate  instillation was performed by Manford Cummins, MD. Electronically Signed   By: Limin  Xu M.D.   On: 08/22/2024 08:38   Result Date: 08/22/2024 CLINICAL DATA:  83 year old male with a history of large B-cell lymphoma of the testicle. He presents for lumbar puncture with CSF fluid collection and instillation of intrathecal methotrexate . EXAM: LUMBAR PUNCTURE UNDER FLUOROSCOPY PROCEDURE: An appropriate skin entry site was determined fluoroscopically. Operator donned sterile gloves and mask. Skin site was marked, then prepped with Betadine, draped in usual sterile fashion, and infiltrated locally with 1% lidocaine . A 20 gauge spinal needle advanced into the thecal sac at L4-5 from a right interlaminar approach. Clear colorless CSF spontaneously returned and 4 ml CSF were collected for flow cytometry. Next, Dr. Cummins injected 5 mL of methotrexate  into the subarachnoid space. The needle was then removed. The patient tolerated the procedure well and there were no immediate complications. FLUOROSCOPY: Radiation Exposure Index (as provided by the fluoroscopic device): 13.7 mGy Kerma IMPRESSION: Technically successful lumbar puncture and methotrexate  instillation under fluoroscopy. This exam was  performed by McKenzie McInnis PA-C and Manford Cummins, MD. Electronically Signed: By: Limin  Xu M.D. On: 08/01/2024 12:13

## 2024-08-22 NOTE — Assessment & Plan Note (Signed)
 Recommend patient to take calcium  1200 mg daily.  Take vitamin D supplementation.

## 2024-08-23 ENCOUNTER — Ambulatory Visit
Admission: RE | Admit: 2024-08-23 | Discharge: 2024-08-23 | Disposition: A | Discharge status: Home / Self Care | Source: Ambulatory Visit | Attending: Oncology | Admitting: Oncology

## 2024-08-23 ENCOUNTER — Other Ambulatory Visit: Payer: Self-pay | Admitting: Oncology

## 2024-08-23 VITALS — BP 125/62 | HR 72 | Temp 97.6°F | Resp 15 | Ht 67.0 in | Wt 135.0 lb

## 2024-08-23 DIAGNOSIS — C8510 Unspecified B-cell lymphoma, unspecified site: Secondary | ICD-10-CM | POA: Insufficient documentation

## 2024-08-23 DIAGNOSIS — C8580 Other specified types of non-Hodgkin lymphoma, unspecified site: Secondary | ICD-10-CM

## 2024-08-23 DIAGNOSIS — E871 Hypo-osmolality and hyponatremia: Secondary | ICD-10-CM

## 2024-08-23 DIAGNOSIS — Z5111 Encounter for antineoplastic chemotherapy: Secondary | ICD-10-CM

## 2024-08-23 MED ORDER — LIDOCAINE 1 % OPTIME INJ - NO CHARGE
3.0000 mL | Freq: Once | INTRAMUSCULAR | Status: AC
  Administered 2024-08-23: 3 mL via INTRADERMAL
  Filled 2024-08-23: qty 4

## 2024-08-23 MED ORDER — ACETAMINOPHEN 325 MG PO TABS
650.0000 mg | ORAL_TABLET | ORAL | Status: DC | PRN
  Administered 2024-08-23: 650 mg via ORAL
  Filled 2024-08-23: qty 2

## 2024-08-23 MED ORDER — ACETAMINOPHEN 325 MG PO TABS
ORAL_TABLET | ORAL | Status: AC
  Filled 2024-08-23: qty 2

## 2024-08-23 NOTE — Discharge Instructions (Signed)
Lumbar Puncture, Care After Refer to this sheet in the next few weeks. These instructions provide you with information on caring for yourself after your procedure. Your health care provider may also give you more specific instructions. Your treatment has been planned according to current medical practices, but problems sometimes occur. Call your health care provider if you have any problems or questions after your procedure. What can I expect after the procedure? After your procedure, it is typical to have the following sensations: Mild discomfort or pain at the insertion site. Mild headache that is relieved with pain medicines.  Follow these instructions at home:  Avoid lifting anything heavier than 10 lb (4.5 kg) for at least 12 hours after the procedure. Drink enough fluids to keep your urine clear or pale yellow. Lay flat or as flat as possible for the remainder of the day. Contact a health care provider if: You have fever or chills. You have nausea or vomiting. You have a headache that lasts for more than 2 days. Get help right away if: You have any numbness or tingling in your legs. You are unable to control your bowel or bladder. You have bleeding or swelling in your back at the insertion site. You are dizzy or faint. This information is not intended to replace advice given to you by your health care provider. Make sure you discuss any questions you have with your health care provider. Document Released: 12/12/2013 Document Revised: 05/14/2016 Document Reviewed: 08/15/2013 Elsevier Interactive Patient Education  2017 [employer] 

## 2024-08-23 NOTE — Procedures (Signed)
 Interventional Radiology Procedure:   Indications: Large B-cell lymphoma  Procedure: Fluoroscopic guided LP with intrathecal chemotherapy   Findings: Removed 6 ml of clear CSF.   Opening pressure 18 cm.  Intrathecal methotrexate  administered without complication.   Complications: None     EBL: Minimal  Plan: Bedrest 2 hours.  Plan to discharge later today.   Maycel Riffe R. Philip, MD  Pager: [phone]

## 2024-08-24 ENCOUNTER — Inpatient Hospital Stay

## 2024-08-24 DIAGNOSIS — C8580 Other specified types of non-Hodgkin lymphoma, unspecified site: Secondary | ICD-10-CM

## 2024-08-24 DIAGNOSIS — Z5111 Encounter for antineoplastic chemotherapy: Secondary | ICD-10-CM | POA: Diagnosis not present

## 2024-08-24 MED ORDER — PEGFILGRASTIM-CBQV 6 MG/0.6ML ~~LOC~~ SOSY
6.0000 mg | PREFILLED_SYRINGE | Freq: Once | SUBCUTANEOUS | Status: AC
  Administered 2024-08-24: 6 mg via SUBCUTANEOUS
  Filled 2024-08-24: qty 0.6

## 2024-08-28 LAB — COMP PANEL: LEUKEMIA/LYMPHOMA

## 2024-08-29 ENCOUNTER — Ambulatory Visit: Attending: Cardiology | Admitting: Cardiology

## 2024-08-29 ENCOUNTER — Encounter: Payer: Self-pay | Admitting: Cardiology

## 2024-08-29 VITALS — BP 130/72 | HR 57 | Ht 66.0 in | Wt 135.6 lb

## 2024-08-29 DIAGNOSIS — I255 Ischemic cardiomyopathy: Secondary | ICD-10-CM

## 2024-08-29 DIAGNOSIS — I10 Essential (primary) hypertension: Secondary | ICD-10-CM | POA: Diagnosis not present

## 2024-08-29 DIAGNOSIS — Z951 Presence of aortocoronary bypass graft: Secondary | ICD-10-CM

## 2024-08-29 DIAGNOSIS — I25118 Atherosclerotic heart disease of native coronary artery with other forms of angina pectoris: Secondary | ICD-10-CM | POA: Diagnosis not present

## 2024-08-29 DIAGNOSIS — C8330 Diffuse large B-cell lymphoma, unspecified site: Secondary | ICD-10-CM

## 2024-08-29 DIAGNOSIS — I5032 Chronic diastolic (congestive) heart failure: Secondary | ICD-10-CM

## 2024-08-29 DIAGNOSIS — E785 Hyperlipidemia, unspecified: Secondary | ICD-10-CM | POA: Diagnosis not present

## 2024-08-29 DIAGNOSIS — R001 Bradycardia, unspecified: Secondary | ICD-10-CM

## 2024-08-29 MED ORDER — REPATHA SURECLICK 140 MG/ML ~~LOC~~ SOAJ: 1.0000 | SUBCUTANEOUS | 3 refills | Status: AC

## 2024-08-29 MED ORDER — METOPROLOL SUCCINATE ER 25 MG PO TB24: 12.5000 mg | ORAL_TABLET | Freq: Every day | ORAL | 3 refills | Status: AC

## 2024-08-29 MED ORDER — LOSARTAN POTASSIUM 50 MG PO TABS
50.0000 mg | ORAL_TABLET | Freq: Every day | ORAL | 3 refills | Status: AC
Start: 2024-07-18 — End: 2025-07-18

## 2024-08-29 NOTE — Patient Instructions (Signed)
 Medication Instructions:  Your physician recommends that you continue on your current medications as directed. Please refer to the Current Medication list given to you today.   *If you need a refill on your cardiac medications before your next appointment, please call your pharmacy*  Lab Work: No labs ordered today  If you have labs (blood work) drawn today and your tests are completely normal, you will receive your results only by: MyChart Message (if you have MyChart) OR A paper copy in the mail If you have any lab test that is abnormal or we need to change your treatment, we will call you to review the results.  Testing/Procedures: Your physician has requested that you have an echocardiogram. Echocardiography is a painless test that uses sound waves to create images of your heart. It provides your doctor with information about the size and shape of your heart and how well your heart's chambers and valves are working.   You Hun[place] receive an ultrasound enhancing agent through an IV if needed to better visualize your heart during the echo. This procedure takes approximately one hour.  There are no restrictions for this procedure.  This will take place at [address] [facility] [address], [place] [address]  Please note: We ask at that you not bring children with you during ultrasound (echo/ vascular) testing. Due to room size and safety concerns, children are not allowed in the ultrasound rooms during exams. Our front office staff cannot provide observation of children in our lobby area while testing is being conducted. An adult accompanying a patient to their appointment will only be allowed in the ultrasound room at the discretion of the ultrasound technician under special circumstances. We apologize for any inconvenience.   Follow-Up: At [facility], you and your health needs are our priority.  As part of our continuing mission to provide you with exceptional heart  care, our providers are all part of one team.  This team includes your primary Cardiologist (physician) and Advanced Practice Providers or APPs (Physician Assistants and Nurse Practitioners) who all work together to provide you with the care you need, when you need it.  Your next appointment:   6 month(s)  Provider:   You Kleinert see Redell Cave, MD or one of the following Advanced Practice Providers on your designated Care Team:   Lonni Meager, NP Lesley Maffucci, PA-C Bernardino Bring, PA-C Cadence [place], PA-C Tylene Lunch, NP Barnie Hila, NP

## 2024-08-29 NOTE — Progress Notes (Signed)
 Cardiology Office Note   Date:  08/29/2024  ID:  [name], DOB 1941/09/03, MRN [id number] PCP: Sherial Bail, MD  [facility] Providers Cardiologist:  Redell Cave, MD     History of Present Illness [name] is a 83 y.o. 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   [name] regimen is last on 01/07/2020 with an NSTEMI with high-sensitivity troponin peaking at 58 of 7. Left heart cath showed severe left main, OM, and RCA disease. He was transferred to [facility] for emergent bypass surgery. He underwent four-vessel CABG of 01/08/2020 with LIMA to LAD, SVG to RPDA, SVG to OM1 and OM 2. Perioperative TEE showed an EF of 45-50%. Postoperatively he had anemia 7.8 but did not require blood transfusion. Follow-up hospital visit on 2/24 he had complained about intermittent palpitations mostly at night.   On 2/9 chest pain following increasing stress he was brought to the [facility] emergency department he was noted to be in new onset atrial flutter with RVR with a 2-1 AV block.  He was given IV diltiazem  10 mg x 1 along with IV Ativan  0.5 mg x 1 with spontaneous conversion to sinus rhythm.  Labs revealed COVID-19 was negative high-sensitivity troponin is 31 with a delta 32, BNP 458, chest x-ray showed bilateral pleural effusions that improved with moderate residual pleural fluid at the left lung base with associated atelectasis.  He was continued on PTA Toprol -XL and full dose aspirin .  Early the next morning he redeveloped atrial flutter with RVR and remained in the rhythm at the time with ventricular rates of 80s to 120s.  He also continued to complain of intermittent palpitations.  Medications were titrated and he was  considered stable for discharge on 02/01/2020.    No was seen in clinic 08/03/2024 by Dr. Cave.  He had recently undergone right hip surgery.  Surgery was successful and he was currently undergoing physical therapy but denied any anginal anginal complaints.  He was continued on Repatha  and no other changes were made to his medication and further testing was ordered.  He was last seen in clinic 09/01/2023 accompanied by his wife.  He been doing well from a cardiac perspective.  He was sent for updated labs.  Was continued on his medications with no further testing ordered.  He returns to clinic today accompanied by his wife.  He states that overall he thinks he has done fairly well.  He has had a lot from side effects related to his treatments for his large B-cell lymphoma.  He states that he recently had PET scan and is supposed to start new treatments on 09/12/2024.  He denies any chest pain but has started to have some shortness of breath.  Edema has resolved since discontinuing amlodipine  and starting losartan .  Recently had to have his esophagus stretched due to dysphagia.  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  States that he has been compliant with his current medication.  ROS: 10 point review of systems has been reviewed and considered negative except what has been listed in the HPI  Studies Reviewed EKG Interpretation Date/Time:  Tuesday August 29 2024 10:02:26 EDT Ventricular Rate:  57 PR Interval:  188 QRS Duration:  84 QT Interval:  444 QTC Calculation: 432 R Axis:   26  Text Interpretation: Sinus bradycardia Septal infarct (cited on or before 23-[name]-2025) When compared with ECG of 23-Shutter-2025 09:47, No acute changes Confirmed by Gerard Frederick ([id number]) on 08/29/2024 10:08:03 AM    TTE 04/14/22  1. Left ventricular ejection fraction, by estimation, is 50 to 55%. The  left ventricle has low normal function. The left ventricle  demonstrates  regional wall motion abnormalities (Hypokinesis of the septal wall,  possibly post surgical etiology). Left  ventricular diastolic parameters are consistent with Grade II diastolic  dysfunction (pseudonormalization). The average left ventricular global  longitudinal strain is -18.3 %.   2. Right ventricular systolic function is normal. The right ventricular  size is normal. There is normal pulmonary artery systolic pressure. The  estimated right ventricular systolic pressure is 32.9 mmHg.   3. The mitral valve is normal in structure. Mild to moderate mitral valve  regurgitation. No evidence of mitral stenosis.   4. The aortic valve is normal in structure. Aortic valve regurgitation is  not visualized. Aortic valve sclerosis is present, with no evidence of  aortic valve stenosis.   5. The inferior vena cava is normal in size with greater than 50%  respiratory variability, suggesting right atrial pressure of 3 mmHg.    LHC 01/08/2020: Ost LM to Mid LM lesion is 99% stenosed. 1st Mrg lesion is 80% stenosed. Mid LAD lesion is 40% stenosed. LV end diastolic pressure is moderately elevated. The left ventricular ejection fraction is 45-50% by visual estimate. There is mild left ventricular systolic dysfunction. Prox RCA lesion is 99% stenosed. Ost RCA to Prox RCA lesion is 70% stenosed. Dist RCA lesion is 30% stenosed.   1.  Critical left main and RCA disease. 2.  Mildly reduced LV systolic function with an EF of 45%. 3.  Moderately elevated left ventricular end-diastolic pressure.   Recommendations: Recommend emergent CABG.  I discussed with Dr. Fleeta Ochoa and the patient will be transferred to [facility]  Intraoperative TEE 01/08/2020: POST-OP IMPRESSIONS  - Left Ventricle: The left ventricle is unchanged from pre-bypass.  - Aorta: The aorta appears unchanged from pre-bypass.  - Aortic Valve: The aortic valve appears unchanged from pre-bypass.  - Mitral Valve: The  mitral valve appears unchanged from pre-bypass.   PRE-OP FINDINGS   Left Ventricle: The left ventricle has mildly reduced systolic function,  with an ejection fraction of 45-50%. The cavity size was mildly dilated.  There is mildly increased left ventricular wall thickness.   Risk Assessment/Calculations           Physical Exam VS:  BP 130/72 (BP Location: Left Arm, Patient Position: Sitting, Cuff Size: Normal)   Pulse (!) 57   Ht 5' 6 (1.676 m)   Wt 135 lb 9.6 oz (61.5 kg)   SpO2 99%   BMI 21.89 kg/m        Wt Readings from Last 3 Encounters:  08/29/24 135 lb 9.6 oz (61.5 kg)  08/23/24 135 lb (61.2 kg)  08/22/24 131 lb 14.4 oz (59.8 kg)    GEN: Well nourished, well developed in no acute distress NECK: No JVD; No carotid bruits CARDIAC: RRR, no murmurs, rubs, gallops RESPIRATORY:  Clear to auscultation without rales, wheezing or rhonchi  ABDOMEN: Soft, non-tender, non-distended EXTREMITIES:  No edema; No deformity   ASSESSMENT AND PLAN Coronary artery disease status post CABG x 4 vessel without angina.  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  He is continued on Repatha  140 mg every 2 weeks as he has low tolerance.  Previously had to be taken off of aspirin  therapy after surgery that was never restarted.  No further ischemic workup at this time.  Primary hypertension with blood pressure 140/60 and a recheck of his blood pressure today 130/72.  Blood pressure has been well-controlled on losartan  50 mg daily and Toprol -XL 12.5 mg daily.  He has been encouraged to continue to take his medications 1 to 2 hours postmedication administration at home as well.  Hyperlipidemia with LDL in the 90s slightly above goal.  Goal would be 70 or less.  Unfortunately he has a longstanding statin intolerance.  He is continued on Repatha  140 mg every 2 weeks.  HFimpEF/ischemic cardiomyopathy with last echocardiogram completed in 2023 with an LVEF  50-55%.  He appears to be euvolemic on exam today.  He is continued on ARB and beta-blocker therapy.  He has been scheduled for an updated echocardiogram today on going treatments for his large B-cell lymphoma.  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  Ongoing management per oncology.  Sinus bradycardia noted on EKG with patient is asymptomatic.  He is continued on Toprol -XL 12.5 mg daily.  He is chronotropically appropriate.  Will continue to monitor with surveillance studies.  If he becomes symptomatic to bradycardia consider discontinuation of beta-blocker therapy.       Dispo: Patient to return to clinic to see MD/APP in 6 months or sooner if needed for further evaluation.  Signed, Xaria Judon, NP

## 2024-09-05 ENCOUNTER — Other Ambulatory Visit: Payer: Self-pay | Admitting: Nurse Practitioner

## 2024-09-05 NOTE — Telephone Encounter (Signed)
 Wife checked over at the pharmacy and they will put in to get him some fluconazole .  She says that she has 2 meds that it is getting down to needing a refill but she is not in the place that she has the prescription's name so she will get to it and send it to us  tomorrow

## 2024-09-06 ENCOUNTER — Other Ambulatory Visit: Payer: Self-pay

## 2024-09-12 ENCOUNTER — Encounter: Payer: Self-pay | Admitting: Oncology

## 2024-09-12 ENCOUNTER — Inpatient Hospital Stay (HOSPITAL_BASED_OUTPATIENT_CLINIC_OR_DEPARTMENT_OTHER): Admitting: Oncology

## 2024-09-12 ENCOUNTER — Inpatient Hospital Stay

## 2024-09-12 VITALS — BP 135/51 | HR 60 | Temp 97.0°F | Resp 18 | Wt 131.3 lb

## 2024-09-12 VITALS — BP 107/50 | HR 60 | Temp 97.5°F | Resp 15

## 2024-09-12 DIAGNOSIS — K5903 Drug induced constipation: Secondary | ICD-10-CM

## 2024-09-12 DIAGNOSIS — K5900 Constipation, unspecified: Secondary | ICD-10-CM | POA: Diagnosis not present

## 2024-09-12 DIAGNOSIS — Z5111 Encounter for antineoplastic chemotherapy: Secondary | ICD-10-CM

## 2024-09-12 DIAGNOSIS — C83398 Diffuse large b-cell lymphoma of other extranodal and solid organ sites: Secondary | ICD-10-CM | POA: Diagnosis not present

## 2024-09-12 DIAGNOSIS — C8580 Other specified types of non-Hodgkin lymphoma, unspecified site: Secondary | ICD-10-CM

## 2024-09-12 DIAGNOSIS — B3781 Candidal esophagitis: Secondary | ICD-10-CM

## 2024-09-12 DIAGNOSIS — T451X5A Adverse effect of antineoplastic and immunosuppressive drugs, initial encounter: Secondary | ICD-10-CM

## 2024-09-12 DIAGNOSIS — E871 Hypo-osmolality and hyponatremia: Secondary | ICD-10-CM

## 2024-09-12 DIAGNOSIS — G620 Drug-induced polyneuropathy: Secondary | ICD-10-CM | POA: Insufficient documentation

## 2024-09-12 LAB — CBC WITH DIFFERENTIAL (CANCER CENTER ONLY)
Abs Immature Granulocytes: 0.48 K/uL — ABNORMAL HIGH (ref 0.00–0.07)
Basophils Absolute: 0.1 K/uL (ref 0.0–0.1)
Basophils Relative: 2 %
Eosinophils Absolute: 0 K/uL (ref 0.0–0.5)
Eosinophils Relative: 0 %
HCT: 31.8 % — ABNORMAL LOW (ref 39.0–52.0)
Hemoglobin: 10.9 g/dL — ABNORMAL LOW (ref 13.0–17.0)
Immature Granulocytes: 6 %
Lymphocytes Relative: 16 %
Lymphs Abs: 1.2 K/uL (ref 0.7–4.0)
MCH: 32.2 pg (ref 26.0–34.0)
MCHC: 34.3 g/dL (ref 30.0–36.0)
MCV: 94.1 fL (ref 80.0–100.0)
Monocytes Absolute: 1.1 K/uL — ABNORMAL HIGH (ref 0.1–1.0)
Monocytes Relative: 15 %
Neutro Abs: 4.6 K/uL (ref 1.7–7.7)
Neutrophils Relative %: 61 %
Platelet Count: 227 K/uL (ref 150–400)
RBC: 3.38 MIL/uL — ABNORMAL LOW (ref 4.22–5.81)
RDW: 16.9 % — ABNORMAL HIGH (ref 11.5–15.5)
Smear Review: NORMAL
WBC Count: 7.6 K/uL (ref 4.0–10.5)
nRBC: 0 % (ref 0.0–0.2)

## 2024-09-12 LAB — CMP (CANCER CENTER ONLY)
ALT: 18 U/L (ref 0–44)
AST: 26 U/L (ref 15–41)
Albumin: 3.9 g/dL (ref 3.5–5.0)
Alkaline Phosphatase: 146 U/L — ABNORMAL HIGH (ref 38–126)
Anion gap: 8 (ref 5–15)
BUN: 11 mg/dL (ref 8–23)
CO2: 24 mmol/L (ref 22–32)
Calcium: 8.9 mg/dL (ref 8.9–10.3)
Chloride: 99 mmol/L (ref 98–111)
Creatinine: 0.77 mg/dL (ref 0.61–1.24)
GFR, Estimated: >60 mL/min (ref >60)
Glucose, Bld: 101 mg/dL — ABNORMAL HIGH (ref 70–99)
Potassium: 4 mmol/L (ref 3.5–5.1)
Sodium: 131 mmol/L — ABNORMAL LOW (ref 135–145)
Total Bilirubin: 0.8 mg/dL (ref 0.0–1.2)
Total Protein: 6.2 g/dL — ABNORMAL LOW (ref 6.5–8.1)

## 2024-09-12 MED ORDER — SODIUM CHLORIDE (PF) 0.9 % IJ SOLN
Freq: Once | INTRAMUSCULAR | Status: AC
  Filled 2024-09-12: qty 0.48

## 2024-09-12 MED ORDER — SODIUM CHLORIDE 0.9 % IV SOLN
400.0000 mg/m2 | Freq: Once | INTRAVENOUS | Status: AC
  Administered 2024-09-12: 660 mg via INTRAVENOUS
  Filled 2024-09-12: qty 33

## 2024-09-12 MED ORDER — GABAPENTIN 100 MG PO CAPS
100.0000 mg | ORAL_CAPSULE | Freq: Every day | ORAL | 0 refills | Status: DC
Start: 2024-09-12 — End: 2024-10-09

## 2024-09-12 MED ORDER — SODIUM CHLORIDE 0.9 % IV SOLN
375.0000 mg/m2 | Freq: Once | INTRAVENOUS | Status: AC
  Administered 2024-09-12: 600 mg via INTRAVENOUS
  Filled 2024-09-12: qty 10

## 2024-09-12 MED ORDER — DOXORUBICIN HCL CHEMO IV INJECTION 2 MG/ML
25.0000 mg/m2 | Freq: Once | INTRAVENOUS | Status: AC
  Administered 2024-09-12: 42 mg via INTRAVENOUS
  Filled 2024-09-12: qty 21

## 2024-09-12 MED ORDER — PALONOSETRON HCL INJECTION 0.25 MG/5ML
0.2500 mg | Freq: Once | INTRAVENOUS | Status: AC
  Administered 2024-09-12: 0.25 mg via INTRAVENOUS
  Filled 2024-09-12: qty 5

## 2024-09-12 MED ORDER — DIPHENHYDRAMINE HCL 25 MG PO CAPS
50.0000 mg | ORAL_CAPSULE | Freq: Once | ORAL | Status: AC
  Administered 2024-09-12: 50 mg via ORAL
  Filled 2024-09-12: qty 2

## 2024-09-12 MED ORDER — VINCRISTINE SULFATE CHEMO INJECTION 1 MG/ML
1.0000 mg | Freq: Once | INTRAVENOUS | Status: AC
  Administered 2024-09-12: 1 mg via INTRAVENOUS
  Filled 2024-09-12: qty 1

## 2024-09-12 MED ORDER — SODIUM CHLORIDE 0.9 % IV SOLN
INTRAVENOUS | Status: DC
  Filled 2024-09-12: qty 250

## 2024-09-12 MED ORDER — APREPITANT 130 MG/18ML IV EMUL
130.0000 mg | Freq: Once | INTRAVENOUS | Status: AC
  Administered 2024-09-12: 130 mg via INTRAVENOUS
  Filled 2024-09-12: qty 18

## 2024-09-12 MED ORDER — SODIUM CHLORIDE 0.9 % IV SOLN
15.0000 mg | Freq: Once | INTRAVENOUS | Status: AC
  Administered 2024-09-12: 15 mg via INTRAVENOUS
  Filled 2024-09-12: qty 1.5

## 2024-09-12 MED ORDER — ACETAMINOPHEN 325 MG PO TABS
650.0000 mg | ORAL_TABLET | Freq: Once | ORAL | Status: AC
  Administered 2024-09-12: 650 mg via ORAL
  Filled 2024-09-12: qty 2

## 2024-09-12 NOTE — Assessment & Plan Note (Signed)
 Chemotherapy as listed above.

## 2024-09-12 NOTE — Assessment & Plan Note (Signed)
 Intermittent chronic issue for him.  He is asymptomatic.  Patient [name] liberate salt intake Serum osmolarity is normal.

## 2024-09-12 NOTE — Assessment & Plan Note (Signed)
 We discussed about bowel regimen.  Recommend Senokot 2 tab daily, Miralax 17g daily PRN.

## 2024-09-12 NOTE — Progress Notes (Signed)
 Patient for [name] Intrathecal Methotrexate  Inj on Wed 09/13/24, I called and spoke with the patient on the phone and gave pre-procedure instructions. Pt was made aware to be here at 9:30a and check in at the new entrance. Pt stated understanding. Called 09/12/24

## 2024-09-12 NOTE — Progress Notes (Addendum)
 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  Ki-67 70%,  Right orchiectomy pathology results were reviewed and discussed with patient. Recommend R Mini CHOP, intrathecal chemotherapy with methotrexate , radiation.  Prechemo MUGA showed LVEF 51%. FISH Bcl-2/BCL6/c-Myc are all negative.  Labs are reviewed and discussed with patient. PET3 showed partial response - improved but residual testicular activity, small bilateral hilar/infrahilar lymph nodes  Proceed with cycle 5 R - Mini CHOP.  He gets Dexamethasone  with chemo on D1 and he takes prednisone  100mg  D2-5.  intrathecal methotrexate  on D2.  D3 GCSF - recommend claritin 10mg  daily for 4 days.     Constipation We discussed about bowel regimen.  Recommend Senokot 2 tab daily, Miralax 17g daily PRN.   Encounter for antineoplastic chemotherapy Chemotherapy as listed above.   Hypocalcemia Recommend patient to take calcium  1200 mg daily.  Take vitamin D supplementation.   Hyponatremia Intermittent chronic issue for him.  He is asymptomatic.   Patient Kisling liberate salt intake Serum osmolarity is normal.  Esophageal candidiasis (HCC) He has finished course of Fluconazole . I advise him to stop.   Chemotherapy-induced neuropathy Recommend trial of gabapentin  100 mgQHS   Orders Placed This Encounter  Procedures   DG FL LUMBAR PUNCTURE W/CHEMO INJECT    Need flow cytometry collected    Standing Status:   Future    Expected Date:   10/04/2024    Expiration Date:   09/12/2025     Lab orders requested (DO NOT place separate lab orders, these will be automatically ordered during procedure specimen collection)::   None    Reason for Exam (SYMPTOM  OR DIAGNOSIS REQUIRED):   lymphoma; intrathecal methotrexate     Preferred Imaging Location?:   [facility]   Follow up 3 weeks All questions were answered. The patient knows to call the clinic with any problems, questions or concerns.  Call Babara, MD, PhD [facility] Hematology Oncology 09/12/2024    HISTORY OF PRESENTING ILLNESS:  [name] 82 y.o. male presents to establish care for testicular lymphoma.   Oncology History  Lymphoma, large cell (HCC)  04/25/2024 Imaging   ultrasound scrotum with Doppler showed Diffusely heterogeneous right testicular echotexture, most consistent with dominant mass or masses. Multiple left testicular masses. Favor lymphoproliferative process such as lymphoma or leukemia. Bilateral primary testicular neoplasms felt less likely.   Right epididymal enlargement and heterogeneity, favoring epididymitis. The appearance of the testicles is felt unlikely to be be infectious/inflammatory.   04/28/2024 Imaging   CT chest with contrast abdomen pelvis with and without contrast showed 1. Heterogeneous enhancement in both testicles with substantial abnormal enlargement of the right testicle. This is suspicious for testicular neoplasm with lymphoblastic leukemia, lymphoma, or conceivably metastatic disease as top differential diagnostic considerations. Testicular adrenal rest tumors are a differential diagnostic consideration. 2. No overtly pathologic adenopathy identified. 3. Severe parenchymal atrophy of the pancreatic tail and body probably due to a 1.4 cm calcification/stone along the dorsal pancreatic duct at the junction of the body and head. Similar severe atrophy was shown on the CT chest from 01/07/2020. 4.  Branching density in the superior segment right lower lobe favoring  bronchiectasis with airway plugging. 5. Small type 1 hiatal hernia. 6. Prominent stool throughout the colon favors constipation. 7. Chronic appearing anterior wedge compressions at L1 and L2. 8. Lumbar spondylosis and degenerative disc disease at L5-S1 resulting in mild bilateral foraminal impingement. 9.  Aortic Atherosclerosis (ICD10-I70.0). 10. Nonspecific lesion peripherally in the right hepatic lobe. This could be further characterized with hepatic protocol MRI with and without contrast.   05/04/2024 Initial Diagnosis   Testicular lymphoma, large cell   He has experienced scrotal swelling since a hydrocele repair in 2022. The swelling decreased slightly post-surgery but never fully resolved. Recently, he received a report indicating concerns in both testicles, prompting further evaluation.    He reports neck soreness and swelling, initially attributed to physical activity such as painting and building a fence. The neck soreness is described as 'almost miserable.'   He mentions unintentional weight loss, noting a decrease from his usual weight of 135-140 pounds to 122 pounds over the past year. Despite efforts to regain weight, including going on cruises, he has not been successful. He currently weighs 133 pounds with clothes on.   No excessive night sweats. He feels cold most of the time but has experienced some fever. No family history of cancer but there is a family history of high cholesterol and heart problems. He has not had a PSA test but underwent a colonoscopy about three years ago.    05/24/2024 Cancer Staging   Staging form: Hodgkin and Non-Hodgkin Lymphoma, AJCC 8th Edition - Clinical stage from 05/24/2024: Stage III (Diffuse large B-cell lymphoma) - Signed by Babara Call, MD on 06/01/2024 Stage prefix: Initial diagnosis   06/16/2024 -  Chemotherapy   Patient is on Treatment Plan : NON-HODGKINS LYMPHOMA R-CHOP q21d     08/18/2024 Imaging   PET scan showed  1. Interval right  orchiectomy with small amount of residual Deauville 4 activity in the right scrotal sac, nonspecific. 2. Small but hypermetabolic bilateral hilar/infrahilar lymph nodes, Deauville 4 on the right and Deauville 5 on the left. 3. Small faintly hypermetabolic AP window lymph node, Deauville 3. 4. Bandlike nodularity in the superior segment right lower lobe is unchanged and demonstrates only low-grade metabolic activity. Surveillance suggested. 5. Chronic calcific pancreatitis with pancreatic atrophy. 6. Aortic Atherosclerosis (ICD10-I70.0). Coronary and systemic atherosclerosis.   S/p EGD, stretch of stenosis, on Fluconazole  for esophageal candidiasis   Today patient reports feeling well.  He tolerated 4 cycles of R mini CHOP.  Status post 3 cycle of intrathecal methotrexate .  He had back pain after procedure for 1 week. Fatigue after chemo, improved during the 2nd weeks. He was able to get some home improvement project done.   Denies any headache, nausea or vomiting.  Oral intake has improved.  No swallowing difficulties. He has experienced numbness on bilateral lower extremities Weight is stable. Coughs up foaming sputum     MEDICAL HISTORY:  Past Medical History:  Diagnosis Date   Arthritis    Bilateral pleural effusion    Bone spur 2010   right shoulder   CAD (coronary artery disease)    a. 12/2019 NSTEMI/Cath: LM 99ost/m, LAD 40, LCX nl, OM1 80, RCA 70ost/p, 99p, 30d, EF 45-50%; b. 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   Complication of anesthesia    a.) delayed emergence; b.) emergence delirium   Diverticulosis    GERD (gastroesophageal reflux disease)    H/O atrial flutter  HFrEF (heart failure with reduced ejection fraction) (HCC)    a.) TEE 01/08/2020: EF 45-50%; b.) TTE 04/14/2022: EF 50-55%, septal HK, G2DD, GLS /18.3%, mild-mod MR, AoV sclerosis without stenosis.   Hyperlipidemia    Hypertension    Hyponatremia    IBS (irritable bowel syndrome)     Ischemic cardiomyopathy    a. 12/2019 TEE: EF 45-50%. Nl RV fxn. Mild MR.   NSTEMI (non-ST elevated myocardial infarction) (HCC) 01/07/2020   a.) LHC 01/08/2020: EF 45-50%, LVEDP 22 mmHg; 99% o-mLM, 80% OM1, 40% mLAD, 70% o-pRCA, 99% pRCA, 30% dRCA --> consult CVTS; b.) 4v CABG 01/07/2022: LIMA-LAD, SVG-RPDA, SVG-OM1, SVG-OM2.   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   Skin cancer    a.) s/p Mohs surgery   Statin intolerance    Testicular mass     SURGICAL HISTORY: Past Surgical History:  Procedure Laterality Date   BACK SURGERY     Lumbar   BALLOON DILATION N/A 06/29/2024   Procedure: BALLOON DILATION;  Surgeon: Stacia Glendia BRAVO, MD;  Location: [facility] ENDOSCOPY;  Service: Gastroenterology;  Laterality: N/A;   BUNIONECTOMY     CATARACT EXTRACTION, BILATERAL     COLONOSCOPY     CORONARY ARTERY BYPASS GRAFT N/A 01/08/2020   Procedure: CORONARY ARTERY BYPASS GRAFTING (CABG) x 4  WITH ENDOSCOPIC HARVESTING OF BILATERAL GREATER SAPHENOUS VEINS;  Surgeon: Fleeta Hanford Coy, MD;  Location[facility];  Service: Open Heart Surgery;  Laterality: N/A;   ESOPHAGOGASTRODUODENOSCOPY N/A 06/29/2024   Procedure: EGD (ESOPHAGOGASTRODUODENOSCOPY);  Surgeon: Stacia Glendia BRAVO, MD;  Location: [facility] ENDOSCOPY;  Service: Gastroenterology;  Laterality: N/A;   HEMORRHOID SURGERY  2004   HYDROCELE EXCISION     INGUINAL HERNIA REPAIR  01/2010   IR IMAGING GUIDED PORT INSERTION  06/02/2024   LEFT HEART CATH AND CORONARY ANGIOGRAPHY N/A 01/08/2020   Procedure: LEFT HEART CATH AND CORONARY ANGIOGRAPHY;  Surgeon: Darron Deatrice LABOR, MD;  Location: [facility] INVASIVE CV LAB;  Service: Cardiovascular;  Laterality: N/A;   ORCHIECTOMY Right 05/12/2024   Procedure: ORCHIECTOMY;  Surgeon: Francisca Redell BROCKS, MD;  Location: [facility] ORS;   Service: Urology;  Laterality: Right;   ROTATOR CUFF REPAIR Left 06/2010   left/ bone spur Dr.Blackman   SHOULDER ARTHROSCOPY WITH OPEN ROTATOR CUFF REPAIR Right 05/26/2018   Procedure: SHOULDER ARTHROSCOPY WITH OPEN ROTATOR CUFF REPAIR;  Surgeon: Edie Norleen PARAS, MD;  Location: [facility] ORS;  Service: Orthopedics;  Laterality: Right;  debridement, decompression   SKIN CANCER EXCISION  2003-2005   Mohs-right shoulder   TEE WITHOUT CARDIOVERSION N/A 01/08/2020   Procedure: TRANSESOPHAGEAL ECHOCARDIOGRAM (TEE);  Surgeon: Fleeta Hanford, Coy, MD;  Location: [facility] OR;  Service: Open Heart Surgery;  Laterality: N/A;   TOTAL HIP ARTHROPLASTY Right 07/14/2022   Procedure: TOTAL HIP ARTHROPLASTY ANTERIOR APPROACH;  Surgeon: Kathlynn Sharper, MD;  Location: [facility] ORS;  Service: Orthopedics;  Laterality: Right;    SOCIAL HISTORY: Social History   Socioeconomic History   Marital status: Married    Spouse name: Not on file   Number of children: 2   Years of education: Not on file   Highest education level: Not on file  Occupational History   Occupation: retired Academic librarian: retired  Tobacco Use   Smoking status: Never  Smokeless tobacco: Never  Vaping Use   Vaping status: Never Used  Substance and Sexual Activity   Alcohol use: Not Currently    Alcohol/week: 1.0 standard drink of alcohol    Types: 1 Glasses of wine per week    Comment: very rare   Drug use: No   Sexual activity: Not on file  Other Topics Concern   Not on file  Social History Narrative   2 sons, no contact   Splits his time between [place]  and [place]    Has live-in since 2005   Has living will   Requests Bernice--girlfriend or brother Lamar, as health care POA   Would accept resuscitation but no prolonged artificial ventilation   Wouldn't want tube feeds if cognitively unaware   Social Drivers of Health   Financial Resource Strain: Low Risk  (08/01/2024)   Received from [facility]   Overall Financial Resource Strain (CARDIA)    Difficulty of Paying Living Expenses: Not hard at all  Food Insecurity: No Food Insecurity (08/01/2024)   Received from [facility]   Hunger Vital Sign    Within the past 12 months, you worried that your food would run out before you got the money to buy more.: Never true    Within the past 12 months, the food you bought just didn't last and you didn't have money to get more.: Never true  Transportation Needs: No Transportation Needs (08/01/2024)   Received from [facility] - Transportation    In the past 12 months, has lack of transportation kept you from medical appointments or from getting medications?: No    Lack of Transportation (Non-Medical): No  Physical Activity: Not on file  Stress: Not on file  Social Connections: Not on file  Intimate Partner Violence: Not on file    FAMILY HISTORY: Family History  Problem Relation Age of Onset   Heart disease Mother    Alzheimer's disease Mother    Heart attack Father    Heart disease Brother    Stroke Brother    Cancer Neg Hx    Diabetes Neg Hx    Colon cancer Neg Hx    Esophageal cancer Neg Hx    Stomach cancer Neg Hx    Pancreatic cancer Neg Hx     ALLERGIES:  is allergic to crestor [rosuvastatin], penicillins, peanut-containing drug products, atorvastatin, and simvastatin.  MEDICATIONS:  Current Outpatient Medications  Medication Sig Dispense Refill   acetaminophen  (TYLENOL ) 500 MG tablet Take 500-1,000 mg by mouth every 6 (six) hours as needed for moderate pain (pain score 4-6).     acyclovir  (ZOVIRAX ) 400 MG tablet Take 1 tablet (400 mg total) by mouth daily. 30 tablet 3   allopurinol  (ZYLOPRIM ) 300 MG tablet Take 1 tablet (300 mg total) by mouth daily. 30 tablet 3   calcium  carbonate (TUMS - DOSED IN MG ELEMENTAL CALCIUM ) 500 MG chewable tablet Chew 2 tablets (400 mg of elemental calcium  total) by mouth daily.      docusate sodium  (COLACE) 100 MG capsule Take 1 capsule (100 mg total) by mouth 2 (two) times daily. 10 capsule 0   Evolocumab  (REPATHA  SURECLICK) 140 MG/ML SOAJ Inject 140 mg into the skin every 14 (fourteen) days. 6 mL 3   fluconazole  (DIFLUCAN ) 200 MG tablet Take 1 tablet (200 mg total) by mouth daily. 30 tablet 0   gabapentin  (NEURONTIN ) 100 MG capsule Take 1 capsule (100 mg total) by mouth  at bedtime. 30 capsule 0   lidocaine -prilocaine  (EMLA ) cream Apply to affected area once 30 g 3   losartan  (COZAAR ) 50 MG tablet Take 1 tablet (50 mg total) by mouth daily. 90 tablet 3   magic mouthwash (multi-ingredient) oral suspension Swish and spit 5-10 mLs 4 (four) times daily as needed for mouth pain. 480 mL 1   metoprolol  succinate (TOPROL -XL) 25 MG 24 hr tablet Take 0.5 tablets (12.5 mg total) by mouth daily. 45 tablet 3   nitroGLYCERIN  (NITROSTAT ) 0.4 MG SL tablet Place 0.4 mg under the tongue every 5 (five) minutes as needed for chest pain.     omeprazole  (PRILOSEC) 20 MG capsule Take 1 capsule (20 mg total) by mouth daily. 90 capsule 1   ondansetron  (ZOFRAN ) 8 MG tablet Take 1 tablet (8 mg total) by mouth every 8 (eight) hours as needed for nausea or vomiting. Start on the third day after cyclophosphamide  chemotherapy. 30 tablet 1   predniSONE  (DELTASONE ) 50 MG tablet Take 2 tablets (100 mg total) by mouth See admin instructions. Take 100mg  daily on Day 2-5 of chemotherapy.     prochlorperazine  (COMPAZINE ) 10 MG tablet Take 1 tablet (10 mg total) by mouth every 6 (six) hours as needed for nausea or vomiting. 30 tablet 6   tadalafil  (CIALIS ) 10 MG tablet Take 1-2 tablets (10-20 mg total) by mouth daily as needed for erectile dysfunction (take 1 hour prior to sexual activity). 30 tablet 11   traMADol  (ULTRAM ) 50 MG tablet Take 1 tablet (50 mg total) by mouth every 6 (six) hours. 30 tablet 0   No current facility-administered medications for this visit.   Facility-Administered Medications Ordered in  Other Visits  Medication Dose Route Frequency Provider Last Rate Last Admin   0.9 %  sodium chloride  infusion   Intravenous Continuous Babara Call, MD 10 mL/hr at 09/12/24 1210 Infusion Verify at 09/12/24 1210    Review of Systems  Constitutional:  Positive for fatigue. Negative for appetite change, chills, fever and unexpected weight change.  HENT:   Negative for hearing loss and voice change.   Eyes:  Negative for eye problems and icterus.  Respiratory:  Negative for chest tightness, cough and shortness of breath.   Cardiovascular:  Negative for chest pain and leg swelling.  Gastrointestinal:  Negative for abdominal distention and abdominal pain.  Endocrine: Negative for hot flashes.  Genitourinary:  Negative for difficulty urinating, dysuria and frequency.        Status post right orchiectomy  Musculoskeletal:  Positive for arthralgias and neck pain.  Skin:  Negative for itching and rash.  Neurological:  Negative for light-headedness and numbness.  Hematological:  Negative for adenopathy. Does not bruise/bleed easily.  Psychiatric/Behavioral:  Negative for confusion.      PHYSICAL EXAMINATION: ECOG PERFORMANCE STATUS: 1 - Symptomatic but completely ambulatory  Vitals:   09/12/24 0846  BP: (!) 135/51  Pulse: 60  Resp: 18  Temp: (!) 97 F (36.1 C)  SpO2: 100%   Filed Weights   09/12/24 0846  Weight: 131 lb 4.8 oz (59.6 kg)    Physical Exam Constitutional:      General: He is not in acute distress.    Appearance: He is not diaphoretic.  HENT:     Head: Normocephalic and atraumatic.  Eyes:     General: No scleral icterus. Cardiovascular:     Rate and Rhythm: Normal rate and regular rhythm.     Heart sounds: No murmur heard. Pulmonary:  Effort: Pulmonary effort is normal. No respiratory distress.     Breath sounds: No wheezing.  Abdominal:     General: There is no distension.     Palpations: Abdomen is soft.     Tenderness: There is no abdominal tenderness.   Musculoskeletal:        General: Normal range of motion.     Cervical back: Normal range of motion and neck supple.  Skin:    General: Skin is warm and dry.     Findings: No erythema.  Neurological:     Mental Status: He is alert and oriented to person, place, and time. Mental status is at baseline.     Motor: No abnormal muscle tone.  Psychiatric:        Mood and Affect: Affect normal.     LABORATORY DATA:  I have reviewed the data as listed    Latest Ref Rng & Units 09/12/2024    8:17 AM 08/22/2024    7:57 AM 07/31/2024    9:36 AM  CBC  WBC 4.0 - 10.5 K/uL 7.6  6.5  7.2   Hemoglobin 13.0 - 17.0 g/dL 89.0  88.9  88.9   Hematocrit 39.0 - 52.0 % 31.8  31.7  31.2   Platelets 150 - 400 K/uL 227  205  215       Latest Ref Rng & Units 09/12/2024    8:21 AM 08/22/2024    7:57 AM 07/31/2024    9:36 AM  CMP  Glucose 70 - 99 mg/dL 898  889  885   BUN 8 - 23 mg/dL 11  11  10    Creatinine 0.61 - 1.24 mg/dL 9.22  9.33  9.29   Sodium 135 - 145 mmol/L 131  128  127   Potassium 3.5 - 5.1 mmol/L 4.0  3.9  4.3   Chloride 98 - 111 mmol/L 99  96  95   CO2 22 - 32 mmol/L 24  24  23    Calcium  8.9 - 10.3 mg/dL 8.9  8.8  8.7   Total Protein 6.5 - 8.1 g/dL 6.2  6.5  6.3   Total Bilirubin 0.0 - 1.2 mg/dL 0.8  0.8  0.6   Alkaline Phos 38 - 126 U/L 146  134  135   AST 15 - 41 U/L 26  23  22    ALT 0 - 44 U/L 18  20  26       RADIOGRAPHIC STUDIES: I have personally reviewed the radiological images as listed and agreed with the findings in the report. DG FL LUMBAR PUNCTURE W/CHEMO INJECT Result Date: 08/23/2024 CLINICAL DATA:  83 year old with a large B-cell lymphoma. Patient presents for fluoroscopic guided lumbar puncture, CSF collection for cytology and instillation of intrathecal methotrexate . EXAM: FLUOROSCOPICALLY GUIDED LUMBAR PUNCTURE FOR INTRATHECAL CHEMOTHERAPY FLUOROSCOPY: Radiation Exposure Index (as provided by the fluoroscopic device): 4.9 mGy Kerma PROCEDURE: Informed consent was  obtained from the patient prior to the procedure, including potential complications of headache, allergy, and pain. With the patient prone, the lower back was prepped with Betadine. 1% Lidocaine  was used for local anesthesia. Lumbar puncture was performed at the L4-L5 level using a 20 gauge needle with return of clear CSF. Opening pressure was 18 cm. 6 mL of clear CSF was collected for flow cytometry. 5 mL of methotrexate  was injected into the subarachnoid space. Needle was removed without complication. The patient tolerated the procedure well without apparent complication. IMPRESSION: 1. Successful fluoroscopic guided lumbar puncture. Fluid was collected  for flow cytometry. 2. Successful injection of intrathecal methotrexate . Electronically Signed   By: Juliene Balder M.D.   On: 08/23/2024 14:30   NM PET Image Restag (PS) Skull Base To Thigh Result Date: 08/22/2024 CLINICAL DATA:  Subsequent treatment strategy for large-cell lymphoma. EXAM: NUCLEAR MEDICINE PET SKULL BASE TO THIGH TECHNIQUE: 7.3 mCi F-18 FDG was injected intravenously. Full-ring PET imaging was performed from the skull base to thigh after the radiotracer. CT data was obtained and used for attenuation correction and anatomic localization. Fasting blood glucose: 85 mg/dl COMPARISON:  3/89/7974 FINDINGS: Mediastinal blood pool activity: SUV max 1.7 Liver activity: SUV max 2.6 NECK: No significant abnormal hypermetabolic activity in this region. Incidental CT findings: Bilateral common carotid atheromatous vascular calcifications. CHEST: Small but hypermetabolic bilateral hilar/infrahilar lymph nodes. Right infrahilar node with maximum SUV 4.1 (Deauville 4), previously 3.5. Left infrahilar node with maximum SUV 5.7 (Deauville 5), previously 4.9. Small faintly hypermetabolic AP window lymph node, maximum SUV 2.6 (Deauville 3). Bandlike nodularity in the superior segment right lower lobe measuring 3 mm in thickness on image 59 series 6, maximum SUV 1.5 and  previously 1.6. Incidental CT findings: Right Port-A-Cath tip: Cavoatrial junction. Coronary, aortic arch, and branch vessel atherosclerotic vascular disease. Prior CABG. ABDOMEN/PELVIS: Postoperative findings along the right groin and inguinal region some accentuated metabolic activity including a focus of activity along the right inguinal ring with maximum SUV 3.5 (Deauville 4), previously 4.0. Interval right orchiectomy, small amount of residual accentuated activity in the right scrotal sac with maximum SUV 3.5 (Deauville 4), nonspecific. Left testicle maximum SUV 3.2 (Deauville 4), previously 9.2. No current hypermetabolic adenopathy.  Spleen unremarkable. Incidental CT findings: Atherosclerosis is present, including aortoiliac atherosclerotic disease. Chronic calcific pancreatitis with pancreatic atrophy. Hypodense hepatic lesions are not hypermetabolic, probably cysts or similar benign lesions, unchanged. Mild prostatomegaly. SKELETON: Mild diffusely accentuated skeletal activity likely granulocyte stimulation. Incidental CT findings: Spondylosis.  Right hip prosthesis. IMPRESSION: 1. Interval right orchiectomy with small amount of residual Deauville 4 activity in the right scrotal sac, nonspecific. 2. Small but hypermetabolic bilateral hilar/infrahilar lymph nodes, Deauville 4 on the right and Deauville 5 on the left. 3. Small faintly hypermetabolic AP window lymph node, Deauville 3. 4. Bandlike nodularity in the superior segment right lower lobe is unchanged and demonstrates only low-grade metabolic activity. Surveillance suggested. 5. Chronic calcific pancreatitis with pancreatic atrophy. 6. Aortic Atherosclerosis (ICD10-I70.0). Coronary and systemic atherosclerosis. Electronically Signed   By: Ryan Salvage M.D.   On: 08/22/2024 08:48

## 2024-09-12 NOTE — Assessment & Plan Note (Signed)
 He has finished course of Fluconazole . I advise him to stop.

## 2024-09-12 NOTE — Patient Instructions (Signed)
 [facility] - A DEPT OF [facility]  Discharge Instructions: Thank you for choosing [facility] to provide your oncology and hematology care.  If you have a lab appointment with the Cancer Center, please go directly to the Cancer Center and check in at the registration area.  Wear comfortable clothing and clothing appropriate for easy access to any Portacath or PICC line.   We strive to give you quality time with your provider. You Loya need to reschedule your appointment if you arrive late (15 or more minutes).  Arriving late affects you and other patients whose appointments are after yours.  Also, if you miss three or more appointments without notifying the office, you Youtz be dismissed from the clinic at the provider's discretion.      For prescription refill requests, have your pharmacy contact our office and allow 72 hours for refills to be completed.    Today you received the following chemotherapy and/or immunotherapy agents- rituximab , adriamycin , cytoxin, vincristine       To help prevent nausea and vomiting after your treatment, we encourage you to take your nausea medication as directed.  BELOW ARE SYMPTOMS THAT SHOULD BE REPORTED IMMEDIATELY: *FEVER GREATER THAN 100.4 F (38 C) OR HIGHER *CHILLS OR SWEATING *NAUSEA AND VOMITING THAT IS NOT CONTROLLED WITH YOUR NAUSEA MEDICATION *UNUSUAL SHORTNESS OF BREATH *UNUSUAL BRUISING OR BLEEDING *URINARY PROBLEMS (pain or burning when urinating, or frequent urination) *BOWEL PROBLEMS (unusual diarrhea, constipation, pain near the anus) TENDERNESS IN MOUTH AND THROAT WITH OR WITHOUT PRESENCE OF ULCERS (sore throat, sores in mouth, or a toothache) UNUSUAL RASH, SWELLING OR PAIN  UNUSUAL VAGINAL DISCHARGE OR ITCHING   Items with * indicate a potential emergency and should be followed up as soon as possible or go to the Emergency Department if any problems should occur.  Please show the  CHEMOTHERAPY ALERT CARD or IMMUNOTHERAPY ALERT CARD at check-in to the Emergency Department and triage nurse.  Should you have questions after your visit or need to cancel or reschedule your appointment, please contact [facility] - A DEPT OF [facility]  [phone] and follow the prompts.  Office hours are 8:00 a.m. to 4:30 p.m. Monday - Friday. Please note that voicemails left after 4:00 p.m. [place] not be returned until the following business day.  We are closed weekends and major holidays. You have access to a nurse at all times for urgent questions. Please call the main number to the clinic [phone] and follow the prompts.  For any non-urgent questions, you Ketter also contact your provider using MyChart. We now offer e-Visits for anyone 52 and older to request care online for non-urgent symptoms. For details visit [email or web address].   Also download the MyChart app! Go to the app store, search MyChart, open the app, select [facility], and log in with your MyChart username and password.

## 2024-09-12 NOTE — Assessment & Plan Note (Addendum)
 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  Ki-67 70%,  Right orchiectomy pathology results were reviewed and discussed with patient. Recommend R Mini CHOP, intrathecal chemotherapy with methotrexate , radiation.  Prechemo MUGA showed LVEF 51%. FISH Bcl-2/BCL6/c-Myc are all negative.  Labs are reviewed and discussed with patient. PET3 showed partial response - improved but residual testicular activity, small bilateral hilar/infrahilar lymph nodes  Proceed with cycle 5 R - Mini CHOP.  He gets Dexamethasone  with chemo on D1 and he takes prednisone  100mg  D2-5.  intrathecal methotrexate  on D2.  D3 GCSF - recommend claritin 10mg  daily for 4 days.

## 2024-09-12 NOTE — Assessment & Plan Note (Signed)
 Recommend trial of gabapentin  100 mgQHS

## 2024-09-12 NOTE — Patient Instructions (Signed)

## 2024-09-12 NOTE — Assessment & Plan Note (Signed)
 Recommend patient to take calcium  1200 mg daily.  Take vitamin D supplementation.

## 2024-09-13 ENCOUNTER — Encounter: Payer: Self-pay | Admitting: Oncology

## 2024-09-13 ENCOUNTER — Ambulatory Visit
Admission: RE | Admit: 2024-09-13 | Discharge: 2024-09-13 | Disposition: A | Discharge status: Home / Self Care | Source: Ambulatory Visit | Attending: Oncology | Admitting: Oncology

## 2024-09-13 VITALS — BP 106/50 | HR 76 | Temp 97.0°F | Resp 14

## 2024-09-13 DIAGNOSIS — C8580 Other specified types of non-Hodgkin lymphoma, unspecified site: Secondary | ICD-10-CM | POA: Diagnosis present

## 2024-09-13 NOTE — Procedures (Signed)
 PROCEDURE SUMMARY:  Successful fluoroscopic guided lumbar puncture at the level of L4-5.  Opening pressure was 14 cm H2O 5mL of methotrexate  was injected by Dr. ONEIDA Specking.  No immediate complications.  Pt tolerated well.   EBL = none  Please see full dictation in imaging section of Epic for procedure details.   Electronically Signed: Eliya Bubar M Meryem Haertel, PA-C 09/13/2024, 11:57 AM

## 2024-09-14 ENCOUNTER — Inpatient Hospital Stay

## 2024-09-14 DIAGNOSIS — Z5111 Encounter for antineoplastic chemotherapy: Secondary | ICD-10-CM | POA: Diagnosis not present

## 2024-09-14 DIAGNOSIS — C8580 Other specified types of non-Hodgkin lymphoma, unspecified site: Secondary | ICD-10-CM

## 2024-09-14 MED ORDER — PEGFILGRASTIM-CBQV 6 MG/0.6ML ~~LOC~~ SOSY
6.0000 mg | PREFILLED_SYRINGE | Freq: Once | SUBCUTANEOUS | Status: AC
  Administered 2024-09-14: 6 mg via SUBCUTANEOUS
  Filled 2024-09-14: qty 0.6

## 2024-09-27 ENCOUNTER — Ambulatory Visit: Admitting: Urology

## 2024-09-27 VITALS — BP 150/62 | HR 68 | Wt 129.0 lb

## 2024-09-27 DIAGNOSIS — N529 Male erectile dysfunction, unspecified: Secondary | ICD-10-CM

## 2024-09-27 DIAGNOSIS — C8580 Other specified types of non-Hodgkin lymphoma, unspecified site: Secondary | ICD-10-CM

## 2024-09-27 MED ORDER — SILDENAFIL CITRATE 100 MG PO TABS: 50.0000 mg | ORAL_TABLET | Freq: Every day | ORAL | 6 refills | Status: AC | PRN

## 2024-09-27 NOTE — Progress Notes (Signed)
   09/27/2024 2:15 PM   [name] October 27, 1941 [id number]  Reason for visit: Follow up lymphoma right testicle, ED  History: Presented in April 2025 with right testicular swelling and scrotal ultrasound showed a large 7 cm heterogenous mass concerning for malignancy and multiple left-sided abnormalities Underwent challenging right radical orchiectomy 05/12/2024 with pathology showing B-cell lymphoma, tumor adhered/extended into scrotum Staging confirmed stage III B-cell lymphoma, and undergoing treatment with oncology  Physical Exam: BP (!) 150/62 (BP Location: Left Arm, Patient Position: Sitting, Cuff Size: Normal)   Pulse 68   Wt 129 lb (58.5 kg)   SpO2 99%   BMI 20.82 kg/m  GU: Right testicle surgically absent, no firmness or abnormality within the right scrotum, no tenderness.  Left testicle 20 cc without masses, nontender, well-healed right lower abdominal incision   Today: Overall doing well, tolerating chemotherapy with oncology well, denies any urinary symptoms, no scrotal pain Persistent ED despite Cialis  10 to 20 mg on demand, interested in other options  Plan:   Lymphoma: Status post right radical orchiectomy with B-cell lymphoma, undergoing chemotherapy with oncology and has upcoming repeat imaging scheduled.  I reviewed the oncology notes at length. Scrotal pain: Resolved after orchiectomy ED: Will trial sildenafil 50 to 100 mg on demand, risk and benefits discussed, will check morning testosterone at follow-up RTC December 2025 with morning testosterone prior   Johnathan JAYSON Burnet, MD  [facility] [address] [place], [place] [address] [phone]

## 2024-09-28 ENCOUNTER — Other Ambulatory Visit: Payer: Self-pay

## 2024-09-28 ENCOUNTER — Other Ambulatory Visit: Payer: Self-pay | Admitting: Oncology

## 2024-09-28 ENCOUNTER — Ambulatory Visit: Admitting: Urology

## 2024-09-28 DIAGNOSIS — C8580 Other specified types of non-Hodgkin lymphoma, unspecified site: Secondary | ICD-10-CM

## 2024-09-29 ENCOUNTER — Encounter: Payer: Self-pay | Admitting: Oncology

## 2024-10-03 ENCOUNTER — Inpatient Hospital Stay

## 2024-10-03 ENCOUNTER — Encounter: Payer: Self-pay | Admitting: Oncology

## 2024-10-03 ENCOUNTER — Inpatient Hospital Stay: Attending: Oncology

## 2024-10-03 ENCOUNTER — Inpatient Hospital Stay: Admitting: Oncology

## 2024-10-03 VITALS — BP 105/47 | HR 66 | Temp 97.5°F | Resp 18

## 2024-10-03 VITALS — BP 127/57 | HR 57 | Temp 97.4°F | Resp 18 | Wt 135.4 lb

## 2024-10-03 DIAGNOSIS — C8580 Other specified types of non-Hodgkin lymphoma, unspecified site: Secondary | ICD-10-CM

## 2024-10-03 DIAGNOSIS — Z5189 Encounter for other specified aftercare: Secondary | ICD-10-CM | POA: Diagnosis not present

## 2024-10-03 DIAGNOSIS — Z5111 Encounter for antineoplastic chemotherapy: Secondary | ICD-10-CM

## 2024-10-03 DIAGNOSIS — K5900 Constipation, unspecified: Secondary | ICD-10-CM | POA: Insufficient documentation

## 2024-10-03 DIAGNOSIS — D6481 Anemia due to antineoplastic chemotherapy: Secondary | ICD-10-CM

## 2024-10-03 DIAGNOSIS — T451X5A Adverse effect of antineoplastic and immunosuppressive drugs, initial encounter: Secondary | ICD-10-CM | POA: Insufficient documentation

## 2024-10-03 DIAGNOSIS — E871 Hypo-osmolality and hyponatremia: Secondary | ICD-10-CM | POA: Insufficient documentation

## 2024-10-03 DIAGNOSIS — K5903 Drug induced constipation: Secondary | ICD-10-CM

## 2024-10-03 DIAGNOSIS — G620 Drug-induced polyneuropathy: Secondary | ICD-10-CM | POA: Insufficient documentation

## 2024-10-03 DIAGNOSIS — Z5112 Encounter for antineoplastic immunotherapy: Secondary | ICD-10-CM | POA: Diagnosis not present

## 2024-10-03 DIAGNOSIS — C83398 Diffuse large b-cell lymphoma of other extranodal and solid organ sites: Secondary | ICD-10-CM

## 2024-10-03 DIAGNOSIS — N5089 Other specified disorders of the male genital organs: Secondary | ICD-10-CM

## 2024-10-03 LAB — CBC WITH DIFFERENTIAL (CANCER CENTER ONLY)
Abs Immature Granulocytes: 0.15 K/uL — ABNORMAL HIGH (ref 0.00–0.07)
Basophils Absolute: 0.1 K/uL (ref 0.0–0.1)
Basophils Relative: 1 %
Eosinophils Absolute: 0 K/uL (ref 0.0–0.5)
Eosinophils Relative: 0 %
HCT: 29.2 % — ABNORMAL LOW (ref 39.0–52.0)
Hemoglobin: 9.9 g/dL — ABNORMAL LOW (ref 13.0–17.0)
Immature Granulocytes: 3 %
Lymphocytes Relative: 23 %
Lymphs Abs: 1.3 K/uL (ref 0.7–4.0)
MCH: 32.5 pg (ref 26.0–34.0)
MCHC: 33.9 g/dL (ref 30.0–36.0)
MCV: 95.7 fL (ref 80.0–100.0)
Monocytes Absolute: 0.8 K/uL (ref 0.1–1.0)
Monocytes Relative: 13 %
Neutro Abs: 3.4 K/uL (ref 1.7–7.7)
Neutrophils Relative %: 60 %
Platelet Count: 188 K/uL (ref 150–400)
RBC: 3.05 MIL/uL — ABNORMAL LOW (ref 4.22–5.81)
RDW: 14.6 % (ref 11.5–15.5)
WBC Count: 5.7 K/uL (ref 4.0–10.5)
nRBC: 0 % (ref 0.0–0.2)

## 2024-10-03 LAB — CMP (CANCER CENTER ONLY)
ALT: 15 U/L (ref 0–44)
AST: 23 U/L (ref 15–41)
Albumin: 3.6 g/dL (ref 3.5–5.0)
Alkaline Phosphatase: 129 U/L — ABNORMAL HIGH (ref 38–126)
Anion gap: 8 (ref 5–15)
BUN: 9 mg/dL (ref 8–23)
CO2: 23 mmol/L (ref 22–32)
Calcium: 8.4 mg/dL — ABNORMAL LOW (ref 8.9–10.3)
Chloride: 99 mmol/L (ref 98–111)
Creatinine: 0.56 mg/dL — ABNORMAL LOW (ref 0.61–1.24)
GFR, Estimated: >60 mL/min (ref >60)
Glucose, Bld: 112 mg/dL — ABNORMAL HIGH (ref 70–99)
Potassium: 3.8 mmol/L (ref 3.5–5.1)
Sodium: 130 mmol/L — ABNORMAL LOW (ref 135–145)
Total Bilirubin: 0.8 mg/dL (ref 0.0–1.2)
Total Protein: 6 g/dL — ABNORMAL LOW (ref 6.5–8.1)

## 2024-10-03 MED ORDER — SODIUM CHLORIDE 0.9 % IV SOLN
15.0000 mg | Freq: Once | INTRAVENOUS | Status: AC
  Administered 2024-10-03: 15 mg via INTRAVENOUS
  Filled 2024-10-03: qty 1.5

## 2024-10-03 MED ORDER — VINCRISTINE SULFATE CHEMO INJECTION 1 MG/ML
1.0000 mg | Freq: Once | INTRAVENOUS | Status: AC
  Administered 2024-10-03: 1 mg via INTRAVENOUS
  Filled 2024-10-03: qty 1

## 2024-10-03 MED ORDER — PALONOSETRON HCL INJECTION 0.25 MG/5ML
0.2500 mg | Freq: Once | INTRAVENOUS | Status: AC
  Administered 2024-10-03: 0.25 mg via INTRAVENOUS
  Filled 2024-10-03: qty 5

## 2024-10-03 MED ORDER — APREPITANT 130 MG/18ML IV EMUL
130.0000 mg | Freq: Once | INTRAVENOUS | Status: AC
  Administered 2024-10-03: 130 mg via INTRAVENOUS
  Filled 2024-10-03: qty 18

## 2024-10-03 MED ORDER — SODIUM CHLORIDE 0.9 % IV SOLN
INTRAVENOUS | Status: DC
  Filled 2024-10-03: qty 250

## 2024-10-03 MED ORDER — DOXORUBICIN HCL CHEMO IV INJECTION 2 MG/ML
25.0000 mg/m2 | Freq: Once | INTRAVENOUS | Status: AC
  Administered 2024-10-03: 42 mg via INTRAVENOUS
  Filled 2024-10-03: qty 21

## 2024-10-03 MED ORDER — SODIUM CHLORIDE 0.9 % IV SOLN
400.0000 mg/m2 | Freq: Once | INTRAVENOUS | Status: AC
  Administered 2024-10-03: 660 mg via INTRAVENOUS
  Filled 2024-10-03: qty 33

## 2024-10-03 MED ORDER — SODIUM CHLORIDE 0.9 % IV SOLN
375.0000 mg/m2 | Freq: Once | INTRAVENOUS | Status: AC
  Administered 2024-10-03: 600 mg via INTRAVENOUS
  Filled 2024-10-03: qty 10

## 2024-10-03 MED ORDER — SODIUM CHLORIDE (PF) 0.9 % IJ SOLN
Freq: Once | INTRAMUSCULAR | Status: AC
  Filled 2024-10-03: qty 0.48

## 2024-10-03 MED ORDER — DIPHENHYDRAMINE HCL 25 MG PO CAPS
50.0000 mg | ORAL_CAPSULE | Freq: Once | ORAL | Status: AC
  Administered 2024-10-03: 50 mg via ORAL
  Filled 2024-10-03: qty 2

## 2024-10-03 MED ORDER — ACETAMINOPHEN 325 MG PO TABS
650.0000 mg | ORAL_TABLET | Freq: Once | ORAL | Status: AC
  Administered 2024-10-03: 650 mg via ORAL
  Filled 2024-10-03: qty 2

## 2024-10-03 NOTE — Progress Notes (Signed)
 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  Ki-67 70%,  Right orchiectomy pathology results were reviewed and discussed with patient. Recommend R Mini CHOP, intrathecal chemotherapy with methotrexate , radiation.  Prechemo MUGA showed LVEF 51%. FISH Bcl-2/BCL6/c-Myc are all negative.  Labs are reviewed and discussed with patient. PET3 showed partial response - improved but residual testicular activity, small bilateral hilar/infrahilar lymph nodes  Proceed with cycle 6 R - Mini CHOP.  He gets Dexamethasone  with chemo on D1 and he takes prednisone  100mg  D2-5.  intrathecal methotrexate  on D2.  D3 GCSF - recommend claritin 10mg  daily for 4 days.   Refer to Radonc for  scrotal radiation therapy Plan to repeat PET 10-12 weeks after treatment    Chemotherapy-induced neuropathy Recommend trial of gabapentin  100 mgQHS  Constipation We discussed about bowel regimen.  Recommend Senokot 2 tab daily, Miralax 17g daily PRN.   Encounter for antineoplastic chemotherapy Chemotherapy as listed above.   Hypocalcemia Recommend patient to take calcium  1200 mg daily.  Take vitamin D supplementation.   Hyponatremia Intermittent chronic issue for him.  He is asymptomatic.   Patient [name] salt intake Serum osmolarity is normal.  Anemia due to antineoplastic chemotherapy Hb has decreased.  Close monitor.  Repeat cbc in 2 weeks.    Orders Placed This Encounter  Procedures   CMP (Cancer Center only)    Standing Status:   Future     Expected Date:   10/31/2024    Expiration Date:   01/29/2025   CBC with Differential (Cancer Center Only)    Standing Status:   Future    Expected Date:   10/31/2024    Expiration Date:   01/29/2025   Lactate dehydrogenase    Standing Status:   Future    Expected Date:   10/31/2024    Expiration Date:   01/29/2025   CBC with Differential (Cancer Center Only)    Standing Status:   Future    Expected Date:   10/17/2024    Expiration Date:   01/15/2025   Ambulatory referral to Radiation Oncology    Referral Priority:   Routine    Referral Type:   Consultation    Referral Reason:   Specialty Services Required    Requested Specialty:   Radiation Oncology    Number of Visits Requested:   1   Sample to Blood Bank    Standing Status:   Future    Expected Date:   10/31/2024    Expiration Date:   01/29/2025   Sample to Blood Bank    Standing Status:   Future    Expected Date:   10/17/2024    Expiration Date:   01/15/2025   Follow up 4 weeks All questions were answered. The patient knows to call the clinic with any problems, questions or concerns.  Call Babara, MD, PhD [facility] Hematology Oncology 10/03/2024    HISTORY OF PRESENTING ILLNESS:  [name] 83 y.o. male presents to establish care for testicular lymphoma.   Oncology History  Lymphoma, large cell (HCC)  04/25/2024 Imaging  ultrasound scrotum with Doppler showed Diffusely heterogeneous right testicular echotexture, most consistent with dominant mass or masses. Multiple left testicular masses. Favor lymphoproliferative process such as lymphoma or leukemia. Bilateral primary testicular neoplasms felt less likely.   Right epididymal enlargement and heterogeneity, favoring epididymitis. The appearance of the testicles is felt unlikely to be be infectious/inflammatory.   04/28/2024 Imaging   CT chest with contrast abdomen pelvis with and without contrast showed 1. Heterogeneous enhancement in both testicles with  substantial abnormal enlargement of the right testicle. This is suspicious for testicular neoplasm with lymphoblastic leukemia, lymphoma, or conceivably metastatic disease as top differential diagnostic considerations. Testicular adrenal rest tumors are a differential diagnostic consideration. 2. No overtly pathologic adenopathy identified. 3. Severe parenchymal atrophy of the pancreatic tail and body probably due to a 1.4 cm calcification/stone along the dorsal pancreatic duct at the junction of the body and head. Similar severe atrophy was shown on the CT chest from 01/07/2020. 4. Branching density in the superior segment right lower lobe favoring bronchiectasis with airway plugging. 5. Small type 1 hiatal hernia. 6. Prominent stool throughout the colon favors constipation. 7. Chronic appearing anterior wedge compressions at L1 and L2. 8. Lumbar spondylosis and degenerative disc disease at L5-S1 resulting in mild bilateral foraminal impingement. 9.  Aortic Atherosclerosis (ICD10-I70.0). 10. Nonspecific lesion peripherally in the right hepatic lobe. This could be further characterized with hepatic protocol MRI with and without contrast.   05/04/2024 Initial Diagnosis   Testicular lymphoma, large cell   He has experienced scrotal swelling since a hydrocele repair in 2022. The swelling decreased slightly post-surgery but never fully resolved. Recently, he received a report indicating concerns in both testicles, prompting further evaluation.    He reports neck soreness and swelling, initially attributed to physical activity such as painting and building a fence. The neck soreness is described as 'almost miserable.'   He mentions unintentional weight loss, noting a decrease from his usual weight of 135-140 pounds to 122 pounds over the past year. Despite efforts to regain weight, including going on cruises, he has not been successful. He currently weighs 133 pounds with clothes on.   No  excessive night sweats. He feels cold most of the time but has experienced some fever. No family history of cancer but there is a family history of high cholesterol and heart problems. He has not had a PSA test but underwent a colonoscopy about three years ago.    05/24/2024 Cancer Staging   Staging form: Hodgkin and Non-Hodgkin Lymphoma, AJCC 8th Edition - Clinical stage from 05/24/2024: Stage III (Diffuse large B-cell lymphoma) - Signed by Babara Call, MD on 06/01/2024 Stage prefix: Initial diagnosis   06/16/2024 -  Chemotherapy   Patient is on Treatment Plan : NON-HODGKINS LYMPHOMA R-CHOP q21d     08/18/2024 Imaging   PET scan showed  1. Interval right orchiectomy with small amount of residual Deauville 4 activity in the right scrotal sac, nonspecific. 2. Small but hypermetabolic bilateral hilar/infrahilar lymph nodes, Deauville 4 on the right and Deauville 5 on the left. 3. Small faintly hypermetabolic AP window lymph node, Deauville 3. 4. Bandlike nodularity in the superior segment right lower lobe is unchanged and demonstrates only low-grade metabolic activity. Surveillance suggested. 5. Chronic calcific pancreatitis with pancreatic atrophy. 6. Aortic Atherosclerosis (ICD10-I70.0). Coronary and systemic atherosclerosis.   S/p EGD, stretch of stenosis, on Fluconazole  for esophageal candidiasis   Today patient reports feeling well.  He tolerated 5 cycles of R mini CHOP.  Status post  4 cycle of intrathecal methotrexate .  He had back pain after procedure for 1 week.= Fatigue after chemo, improved during the 2nd weeks. He was able to get some home improvement project done.  He has experienced numbness on bilateral lower extremities. He has not tried gabapentin  yet Weight is stable.   Denies any headache, nausea or vomiting.  Oral intake has improved.  No swallowing difficulties.   MEDICAL HISTORY:  Past Medical History:  Diagnosis Date   Arthritis    Bilateral pleural effusion    Bone  spur 2010   right shoulder   CAD (coronary artery disease)    a. 12/2019 NSTEMI/Cath: LM 99ost/m, LAD 40, LCX nl, OM1 80, RCA 70ost/p, 99p, 30d, EF 45-50%; b. 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   Complication of anesthesia    a.) delayed emergence; b.) emergence delirium   Diverticulosis    GERD (gastroesophageal reflux disease)    H/O atrial flutter    HFrEF (heart failure with reduced ejection fraction) (HCC)    a.) TEE 01/08/2020: EF 45-50%; b.) TTE 04/14/2022: EF 50-55%, septal HK, G2DD, GLS /18.3%, mild-mod MR, AoV sclerosis without stenosis.   Hyperlipidemia    Hypertension    Hyponatremia    IBS (irritable bowel syndrome)    Ischemic cardiomyopathy    a. 12/2019 TEE: EF 45-50%. Nl RV fxn. Mild MR.   NSTEMI (non-ST elevated myocardial infarction) (HCC) 01/07/2020   a.) LHC 01/08/2020: EF 45-50%, LVEDP 22 mmHg; 99% o-mLM, 80% OM1, 40% mLAD, 70% o-pRCA, 99% pRCA, 30% dRCA --> consult CVTS; b.) 4v CABG 01/07/2022: LIMA-LAD, SVG-RPDA, SVG-OM1, SVG-OM2.   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   Skin cancer    a.) s/p Mohs surgery   Statin intolerance    Testicular mass     SURGICAL HISTORY: Past Surgical History:  Procedure Laterality Date   BACK SURGERY     Lumbar   BALLOON DILATION N/A 06/29/2024   Procedure: BALLOON DILATION;  Surgeon: Stacia Glendia BRAVO, MD;  Location: [facility] ENDOSCOPY;  Service: Gastroenterology;  Laterality: N/A;   BUNIONECTOMY     CATARACT EXTRACTION, BILATERAL     COLONOSCOPY     CORONARY ARTERY BYPASS GRAFT N/A 01/08/2020   Procedure: CORONARY ARTERY BYPASS GRAFTING (CABG) x 4  WITH ENDOSCOPIC HARVESTING OF BILATERAL GREATER SAPHENOUS VEINS;  Surgeon: Fleeta Hanford Coy, MD;  Location: [facility] OR;  Service: Open Heart Surgery;  Laterality:  N/A;   ESOPHAGOGASTRODUODENOSCOPY N/A 06/29/2024   Procedure: EGD (ESOPHAGOGASTRODUODENOSCOPY);  Surgeon: Stacia Glendia BRAVO, MD;  Location: [facility] ENDOSCOPY;  Service: Gastroenterology;  Laterality: N/A;   HEMORRHOID SURGERY  2004   HYDROCELE EXCISION     INGUINAL HERNIA REPAIR  01/2010   IR IMAGING GUIDED PORT INSERTION  06/02/2024   LEFT HEART CATH AND CORONARY ANGIOGRAPHY N/A 01/08/2020   Procedure: LEFT HEART CATH AND CORONARY ANGIOGRAPHY;  Surgeon: Darron Deatrice LABOR, MD;  Location: [facility] INVASIVE CV LAB;  Service: Cardiovascular;  Laterality: N/A;   ORCHIECTOMY Right 05/12/2024   Procedure: ORCHIECTOMY;  Surgeon: Francisca Redell BROCKS, MD;  Location: [facility] ORS;  Service: Urology;  Laterality: Right;   ROTATOR CUFF REPAIR Left 06/2010   left/ bone spur Dr.Blackman   SHOULDER ARTHROSCOPY WITH OPEN ROTATOR CUFF REPAIR Right 05/26/2018   Procedure: SHOULDER ARTHROSCOPY WITH OPEN  ROTATOR CUFF REPAIR;  Surgeon: Edie Norleen PARAS, MD;  Location: [facility] ORS;  Service: Orthopedics;  Laterality: Right;  debridement, decompression   SKIN CANCER EXCISION  2003-2005   Mohs-right shoulder   TEE WITHOUT CARDIOVERSION N/A 01/08/2020   Procedure: TRANSESOPHAGEAL ECHOCARDIOGRAM (TEE);  Surgeon: Fleeta Ochoa, Maude, MD;  Location: [facility] OR;  Service: Open Heart Surgery;  Laterality: N/A;   TOTAL HIP ARTHROPLASTY Right 07/14/2022   Procedure: TOTAL HIP ARTHROPLASTY ANTERIOR APPROACH;  Surgeon: Kathlynn Sharper, MD;  Location: [facility] ORS;  Service: Orthopedics;  Laterality: Right;    SOCIAL HISTORY: Social History   Socioeconomic History   Marital status: Married    Spouse name: Not on file   Number of children: 2   Years of education: Not on file   Highest education level: Not on file  Occupational History   Occupation: retired Academic librarian: retired  Tobacco Use   Smoking status: Never   Smokeless tobacco: Never  Vaping Use   Vaping status: Never Used  Substance and Sexual Activity    Alcohol use: Not Currently    Alcohol/week: 1.0 standard drink of alcohol    Types: 1 Glasses of wine per week    Comment: very rare   Drug use: No   Sexual activity: Not on file  Other Topics Concern   Not on file  Social History Narrative   2 sons, no contact   Splits his time between [place]  and [place]    Has live-in since 2005   Has living will   Requests Bernice--girlfriend or brother Lamar, as health care POA   Would accept resuscitation but no prolonged artificial ventilation   Wouldn't want tube feeds if cognitively unaware   Social Drivers of Health   Financial Resource Strain: Low Risk  (08/01/2024)   Received from [facility]   Overall Financial Resource Strain (CARDIA)    Difficulty of Paying Living Expenses: Not hard at all  Food Insecurity: No Food Insecurity (08/01/2024)   Received from [facility]   Hunger Vital Sign    Within the past 12 months, you worried that your food would run out before you got the money to buy more.: Never true    Within the past 12 months, the food you bought just didn't last and you didn't have money to get more.: Never true  Transportation Needs: No Transportation Needs (08/01/2024)   Received from [facility] - Transportation    In the past 12 months, has lack of transportation kept you from medical appointments or from getting medications?: No    Lack of Transportation (Non-Medical): No  Physical Activity: Not on file  Stress: Not on file  Social Connections: Not on file  Intimate Partner Violence: Not on file    FAMILY HISTORY: Family History  Problem Relation Age of Onset   Heart disease Mother    Alzheimer's disease Mother    Heart attack Father    Heart disease Brother    Stroke Brother    Cancer Neg Hx    Diabetes Neg Hx    Colon cancer Neg Hx    Esophageal cancer Neg Hx    Stomach cancer Neg Hx    Pancreatic cancer Neg Hx     ALLERGIES:  is  allergic to crestor [rosuvastatin], penicillins, peanut-containing drug products, atorvastatin, and simvastatin.  MEDICATIONS:  Current Outpatient Medications  Medication Sig Dispense Refill   acetaminophen  (TYLENOL )  500 MG tablet Take 500-1,000 mg by mouth every 6 (six) hours as needed for moderate pain (pain score 4-6).     acyclovir  (ZOVIRAX ) 400 MG tablet Take 1 tablet (400 mg total) by mouth daily. 30 tablet 3   allopurinol  (ZYLOPRIM ) 300 MG tablet TAKE 1 TABLET(300 MG) BY MOUTH DAILY 30 tablet 3   calcium  carbonate (TUMS - DOSED IN MG ELEMENTAL CALCIUM ) 500 MG chewable tablet Chew 2 tablets (400 mg of elemental calcium  total) by mouth daily.     docusate sodium  (COLACE) 100 MG capsule Take 1 capsule (100 mg total) by mouth 2 (two) times daily. 10 capsule 0   Evolocumab  (REPATHA  SURECLICK) 140 MG/ML SOAJ Inject 140 mg into the skin every 14 (fourteen) days. 6 mL 3   fluconazole  (DIFLUCAN ) 200 MG tablet Take 1 tablet (200 mg total) by mouth daily. 30 tablet 0   gabapentin  (NEURONTIN ) 100 MG capsule Take 1 capsule (100 mg total) by mouth at bedtime. 30 capsule 0   lidocaine -prilocaine  (EMLA ) cream Apply to affected area once 30 g 3   losartan  (COZAAR ) 50 MG tablet Take 1 tablet (50 mg total) by mouth daily. 90 tablet 3   magic mouthwash (multi-ingredient) oral suspension Swish and spit 5-10 mLs 4 (four) times daily as needed for mouth pain. 480 mL 1   metoprolol  succinate (TOPROL -XL) 25 MG 24 hr tablet Take 0.5 tablets (12.5 mg total) by mouth daily. 45 tablet 3   nitroGLYCERIN  (NITROSTAT ) 0.4 MG SL tablet Place 0.4 mg under the tongue every 5 (five) minutes as needed for chest pain.     omeprazole  (PRILOSEC) 20 MG capsule Take 1 capsule (20 mg total) by mouth daily. 90 capsule 1   ondansetron  (ZOFRAN ) 8 MG tablet Take 1 tablet (8 mg total) by mouth every 8 (eight) hours as needed for nausea or vomiting. Start on the third day after cyclophosphamide  chemotherapy. 30 tablet 1   predniSONE   (DELTASONE ) 50 MG tablet Take 2 tablets (100 mg total) by mouth See admin instructions. Take 100mg  daily on Day 2-5 of chemotherapy.     prochlorperazine  (COMPAZINE ) 10 MG tablet Take 1 tablet (10 mg total) by mouth every 6 (six) hours as needed for nausea or vomiting. 30 tablet 6   sildenafil (VIAGRA) 100 MG tablet Take 0.5-1 tablets (50-100 mg total) by mouth daily as needed for erectile dysfunction (take 1 hour prior to sexual activity on an empty stomach). 20 tablet 6   tadalafil  (CIALIS ) 10 MG tablet Take 1-2 tablets (10-20 mg total) by mouth daily as needed for erectile dysfunction (take 1 hour prior to sexual activity). 30 tablet 11   traMADol  (ULTRAM ) 50 MG tablet Take 1 tablet (50 mg total) by mouth every 6 (six) hours. 30 tablet 0   No current facility-administered medications for this visit.   Facility-Administered Medications Ordered in Other Visits  Medication Dose Route Frequency Provider Last Rate Last Admin   0.9 %  sodium chloride  infusion   Intravenous Continuous Babara Call, MD 10 mL/hr at 10/03/24 0945 New Bag at 10/03/24 0945   cyclophosphamide  (CYTOXAN ) 660 mg in sodium chloride  0.9 % 250 mL chemo infusion  400 mg/m2 (Treatment Plan Recorded) Intravenous Once Babara Call, MD       DOXOrubicin  (ADRIAMYCIN ) chemo injection 42 mg  25 mg/m2 (Treatment Plan Recorded) Intravenous Once Zakia Sainato, MD       riTUXimab -pvvr (RUXIENCE ) 600 mg in sodium chloride  0.9 % 250 mL (1.9355 mg/mL) infusion  375 mg/m2 (Treatment Plan Recorded)  Intravenous Once Babara Call, MD       vinCRIStine  (ONCOVIN ) 1 mg in sodium chloride  0.9 % 50 mL chemo infusion  1 mg Intravenous Once Babara Call, MD        Review of Systems  Constitutional:  Positive for fatigue. Negative for appetite change, chills, fever and unexpected weight change.  HENT:   Negative for hearing loss and voice change.   Eyes:  Negative for eye problems and icterus.  Respiratory:  Negative for chest tightness, cough and shortness of breath.    Cardiovascular:  Negative for chest pain and leg swelling.  Gastrointestinal:  Negative for abdominal distention and abdominal pain.  Endocrine: Negative for hot flashes.  Genitourinary:  Negative for difficulty urinating, dysuria and frequency.        Status post right orchiectomy  Musculoskeletal:  Positive for arthralgias and neck pain.  Skin:  Negative for itching and rash.  Neurological:  Positive for numbness. Negative for light-headedness.  Hematological:  Negative for adenopathy. Does not bruise/bleed easily.  Psychiatric/Behavioral:  Negative for confusion.      PHYSICAL EXAMINATION: ECOG PERFORMANCE STATUS: 1 - Symptomatic but completely ambulatory  Vitals:   10/03/24 0847  BP: (!) 127/57  Pulse: (!) 57  Resp: 18  Temp: (!) 97.4 F (36.3 C)  SpO2: 100%   Filed Weights   10/03/24 0847  Weight: 135 lb 6.4 oz (61.4 kg)    Physical Exam Constitutional:      General: He is not in acute distress.    Appearance: He is not diaphoretic.  HENT:     Head: Normocephalic and atraumatic.  Eyes:     General: No scleral icterus. Cardiovascular:     Rate and Rhythm: Normal rate and regular rhythm.     Heart sounds: No murmur heard. Pulmonary:     Effort: Pulmonary effort is normal. No respiratory distress.     Breath sounds: No wheezing.  Abdominal:     General: There is no distension.     Palpations: Abdomen is soft.     Tenderness: There is no abdominal tenderness.  Musculoskeletal:        General: Normal range of motion.     Cervical back: Normal range of motion and neck supple.  Skin:    General: Skin is warm and dry.     Findings: No erythema.  Neurological:     Mental Status: He is alert and oriented to person, place, and time. Mental status is at baseline.     Motor: No abnormal muscle tone.  Psychiatric:        Mood and Affect: Affect normal.     LABORATORY DATA:  I have reviewed the data as listed    Latest Ref Rng & Units 10/03/2024    8:36 AM  09/12/2024    8:17 AM 08/22/2024    7:57 AM  CBC  WBC 4.0 - 10.5 K/uL 5.7  7.6  6.5   Hemoglobin 13.0 - 17.0 g/dL 9.9  89.0  88.9   Hematocrit 39.0 - 52.0 % 29.2  31.8  31.7   Platelets 150 - 400 K/uL 188  227  205       Latest Ref Rng & Units 10/03/2024    8:36 AM 09/12/2024    8:21 AM 08/22/2024    7:57 AM  CMP  Glucose 70 - 99 mg/dL 887  898  889   BUN 8 - 23 mg/dL 9  11  11    Creatinine 0.61 - 1.24  mg/dL 9.43  9.22  9.33   Sodium 135 - 145 mmol/L 130  131  128   Potassium 3.5 - 5.1 mmol/L 3.8  4.0  3.9   Chloride 98 - 111 mmol/L 99  99  96   CO2 22 - 32 mmol/L 23  24  24    Calcium  8.9 - 10.3 mg/dL 8.4  8.9  8.8   Total Protein 6.5 - 8.1 g/dL 6.0  6.2  6.5   Total Bilirubin 0.0 - 1.2 mg/dL 0.8  0.8  0.8   Alkaline Phos 38 - 126 U/L 129  146  134   AST 15 - 41 U/L 23  26  23    ALT 0 - 44 U/L 15  18  20       RADIOGRAPHIC STUDIES: I have personally reviewed the radiological images as listed and agreed with the findings in the report. DG FL LUMBAR PUNCTURE W/CHEMO INJECT Result Date: 09/13/2024 CLINICAL DATA:  83 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 Patient presents for fluoroscopic guided lumbar puncture with instillation of intrathecal methotrexate . EXAM: LUMBAR PUNCTURE UNDER FLUOROSCOPY PROCEDURE: An appropriate skin entry site was determined fluoroscopically. Operator donned sterile gloves and mask. Skin site was marked, then prepped with Betadine, draped in usual sterile fashion, and infiltrated locally with 1% lidocaine . A 20 gauge spinal needle advanced into the thecal sac at L4-5 from a right interlaminar approach. Clear colorless CSF spontaneously returned, with opening pressure of 14 cm water . 5 mL of methotrexate  was injected into the subarachnoid space. The needle was then removed. The patient tolerated the procedure well and there were no complications. FLUOROSCOPY: Radiation Exposure Index (as provided by the fluoroscopic device): 3.5 mGy Kerma IMPRESSION: Technically  successful lumbar puncture under fluoroscopy. This exam was performed by Sherrilee Bal, PA-C and injection was performed by Dr. ONEIDA Specking. Electronically Signed   By: CHRISTELLA.  Shick M.D.   On: 09/13/2024 15:44

## 2024-10-03 NOTE — Progress Notes (Signed)
 Patient for DG LP Methotrexate  Inj on Wed 10/04/24, I called and spoke with the patient on the phone and gave pre-procedure instructions. Pt was made aware to be here at 9:30a and check in at the new entrance. Pt stated understanding.  Called 10/03/24

## 2024-10-03 NOTE — Assessment & Plan Note (Signed)
 We discussed about bowel regimen.  Recommend Senokot 2 tab daily, Miralax 17g daily PRN.

## 2024-10-03 NOTE — Assessment & Plan Note (Addendum)
 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  Ki-67 70%,  Right orchiectomy pathology results were reviewed and discussed with patient. Recommend R Mini CHOP, intrathecal chemotherapy with methotrexate , radiation.  Prechemo MUGA showed LVEF 51%. FISH Bcl-2/BCL6/c-Myc are all negative.  Labs are reviewed and discussed with patient. PET3 showed partial response - improved but residual testicular activity, small bilateral hilar/infrahilar lymph nodes  Proceed with cycle 6 R - Mini CHOP.  He gets Dexamethasone  with chemo on D1 and he takes prednisone  100mg  D2-5.  intrathecal methotrexate  on D2.  D3 GCSF - recommend claritin 10mg  daily for 4 days.   Refer to Radonc for  scrotal radiation therapy Plan to repeat PET 10-12 weeks after treatment

## 2024-10-03 NOTE — Progress Notes (Signed)
 Nutrition Follow-up:  Patient with b cell lymphoma.  Receiving R mini CHOP and intrathecal methotrexate .  Possible radiation, planning consult with Radiation Oncology.   Met with patient and wife during infusion.  Reports that appetite is good.  Eating mostly 3 + meals a day.  Bowels are moving, on bowel regimen.  Drinking oral nutrition supplements but not daily.      Medications: reviewed  Labs: reviewed  Anthropometrics:   Weight 135 lb  131 lb 14.4 oz on 9/2 132 lb 4.8 oz on 8/11 129 lb 5 oz on 7/15   NUTRITION DIAGNOSIS: Inadequate oral intake improved    INTERVENTION:  Continue foods rich in protein and calories  Continue bowel regimen Continue oral nutrition supplement Contact RD if needed    MONITORING, EVALUATION, GOAL: weight trends, intake   NEXT VISIT: no follow-up RD available if needed  Gates Jividen B. Dasie SOLON, CSO, LDN Registered Dietitian [phone]

## 2024-10-03 NOTE — Assessment & Plan Note (Signed)
 Intermittent chronic issue for him.  He is asymptomatic.  Patient Battaglini liberate salt intake Serum osmolarity is normal.

## 2024-10-03 NOTE — Assessment & Plan Note (Signed)
 Recommend trial of gabapentin  100 mgQHS

## 2024-10-03 NOTE — Assessment & Plan Note (Signed)
 Hb has decreased.  Close monitor.  Repeat cbc in 2 weeks.

## 2024-10-03 NOTE — Progress Notes (Unsigned)
 Informed patient and his wife that per Dr. Babara, take 1,200 mg of Calcium  daily for low calcium  level today. Patient verbalized understanding.

## 2024-10-03 NOTE — Assessment & Plan Note (Signed)
 Chemotherapy as listed above.

## 2024-10-03 NOTE — Patient Instructions (Signed)
 [facility] - A DEPT OF [facility]  Discharge Instructions:   Please pick up 1200 mg Calcium  supplement to take daily. You can pick up the Calcium  over the counter. Thank you for choosing [facility] to provide your oncology and hematology care.  If you have a lab appointment with the Cancer Center, please go directly to the Cancer Center and check in at the registration area.  Wear comfortable clothing and clothing appropriate for easy access to any Portacath or PICC line.   We strive to give you quality time with your provider. You [place] need to reschedule your appointment if you arrive late (15 or more minutes).  Arriving late affects you and other patients whose appointments are after yours.  Also, if you miss three or more appointments without notifying the office, you Krone be dismissed from the clinic at the provider's discretion.      For prescription refill requests, have your pharmacy contact our office and allow 72 hours for refills to be completed.    Today you received the following chemotherapy and/or immunotherapy agents Adriamycin , Vincristine , Cytoxan , Ruxience       To help prevent nausea and vomiting after your treatment, we encourage you to take your nausea medication as directed.  BELOW ARE SYMPTOMS THAT SHOULD BE REPORTED IMMEDIATELY: *FEVER GREATER THAN 100.4 F (38 C) OR HIGHER *CHILLS OR SWEATING *NAUSEA AND VOMITING THAT IS NOT CONTROLLED WITH YOUR NAUSEA MEDICATION *UNUSUAL SHORTNESS OF BREATH *UNUSUAL BRUISING OR BLEEDING *URINARY PROBLEMS (pain or burning when urinating, or frequent urination) *BOWEL PROBLEMS (unusual diarrhea, constipation, pain near the anus) TENDERNESS IN MOUTH AND THROAT WITH OR WITHOUT PRESENCE OF ULCERS (sore throat, sores in mouth, or a toothache) UNUSUAL RASH, SWELLING OR PAIN  UNUSUAL VAGINAL DISCHARGE OR ITCHING   Items with * indicate a potential emergency and should be followed up as  soon as possible or go to the Emergency Department if any problems should occur.  Please show the CHEMOTHERAPY ALERT CARD or IMMUNOTHERAPY ALERT CARD at check-in to the Emergency Department and triage nurse.  Should you have questions after your visit or need to cancel or reschedule your appointment, please contact [facility] - A DEPT OF [facility]  [phone] and follow the prompts.  Office hours are 8:00 a.m. to 4:30 p.m. Monday - Friday. Please note that voicemails left after 4:00 p.m. [place] not be returned until the following business day.  We are closed weekends and major holidays. You have access to a nurse at all times for urgent questions. Please call the main number to the clinic [phone] and follow the prompts.  For any non-urgent questions, you [place] also contact your provider using MyChart. We now offer e-Visits for anyone 21 and older to request care online for non-urgent symptoms. For details visit [email or web address].   Also download the MyChart app! Go to the app store, search MyChart, open the app, select [facility], and log in with your MyChart username and password.

## 2024-10-03 NOTE — Assessment & Plan Note (Signed)
 Recommend patient to take calcium  1200 mg daily.  Take vitamin D supplementation.

## 2024-10-04 ENCOUNTER — Encounter: Payer: Self-pay | Admitting: Oncology

## 2024-10-04 ENCOUNTER — Other Ambulatory Visit: Payer: Self-pay

## 2024-10-04 ENCOUNTER — Ambulatory Visit
Admission: RE | Admit: 2024-10-04 | Discharge: 2024-10-04 | Disposition: A | Discharge status: Home / Self Care | Source: Ambulatory Visit | Attending: Oncology | Admitting: Oncology

## 2024-10-04 VITALS — BP 116/62 | HR 76 | Temp 97.6°F | Resp 12

## 2024-10-04 DIAGNOSIS — C8580 Other specified types of non-Hodgkin lymphoma, unspecified site: Secondary | ICD-10-CM | POA: Diagnosis present

## 2024-10-04 MED ORDER — OXYCODONE-ACETAMINOPHEN 5-325 MG PO TABS
ORAL_TABLET | ORAL | Status: AC
  Filled 2024-10-04: qty 1

## 2024-10-04 MED ORDER — LIDOCAINE 1 % OPTIME INJ - NO CHARGE
5.0000 mL | Freq: Once | INTRAMUSCULAR | Status: DC
  Filled 2024-10-04: qty 6

## 2024-10-04 MED ORDER — ACYCLOVIR 400 MG PO TABS: 400.0000 mg | ORAL_TABLET | Freq: Every day | ORAL | 3 refills | Status: DC

## 2024-10-04 MED ORDER — OXYCODONE-ACETAMINOPHEN 5-325 MG PO TABS
1.0000 | ORAL_TABLET | Freq: Once | ORAL | Status: AC
  Administered 2024-10-04: 1 via ORAL
  Filled 2024-10-04: qty 1

## 2024-10-04 NOTE — Procedures (Signed)
 PROCEDURE SUMMARY:  Successful fluoroscopic guided lumbar puncture at the level of L4-5.  Opening pressure was 16 cm H2O ~4 mL clear colorless fluid collected and sent for labs.  5mL of methotrexate  was then injected by Dr. CHRISTELLA Mania.  Overall, patient tolerated the procedure well. He did start to report worsening back pain in the level of his sacrum following the injection of medication. Reported his pain was 9/10. Evaluated at bedside. Puncture site was unremarkable and patient continues to have full ROM of upper and lower extremities, but reports increased pain locally.   Patient given one dose of Percocet while in recovery. Already has at home prescription for Tramadol  should he need additional pain relief and does not wish to have another prescription. After care discussed with patient and family at bedside who verbalized understanding.   EBL = none  Please see full dictation in imaging section of Epic for procedure details.   Electronically Signed: Julieanna Geraci M Pauline Trainer, PA-C 10/04/2024, 1:26 PM

## 2024-10-04 NOTE — Discharge Instructions (Signed)
Lumbar Puncture, Care After Refer to this sheet in the next few weeks. These instructions provide you with information on caring for yourself after your procedure. Your health care provider may also give you more specific instructions. Your treatment has been planned according to current medical practices, but problems sometimes occur. Call your health care provider if you have any problems or questions after your procedure. What can I expect after the procedure? After your procedure, it is typical to have the following sensations: Mild discomfort or pain at the insertion site. Mild headache that is relieved with pain medicines.  Follow these instructions at home:  Avoid lifting anything heavier than 10 lb (4.5 kg) for at least 12 hours after the procedure. Drink enough fluids to keep your urine clear or pale yellow. Lay flat or as flat as possible for the remainder of the day. Contact a health care provider if: You have fever or chills. You have nausea or vomiting. You have a headache that lasts for more than 2 days. Get help right away if: You have any numbness or tingling in your legs. You are unable to control your bowel or bladder. You have bleeding or swelling in your back at the insertion site. You are dizzy or faint. This information is not intended to replace advice given to you by your health care provider. Make sure you discuss any questions you have with your health care provider. Document Released: 12/12/2013 Document Revised: 05/14/2016 Document Reviewed: 08/15/2013 Elsevier Interactive Patient Education  2017 [employer] 

## 2024-10-05 ENCOUNTER — Inpatient Hospital Stay

## 2024-10-05 ENCOUNTER — Ambulatory Visit: Attending: Cardiology

## 2024-10-05 DIAGNOSIS — C8580 Other specified types of non-Hodgkin lymphoma, unspecified site: Secondary | ICD-10-CM

## 2024-10-05 DIAGNOSIS — I25118 Atherosclerotic heart disease of native coronary artery with other forms of angina pectoris: Secondary | ICD-10-CM | POA: Diagnosis not present

## 2024-10-05 DIAGNOSIS — Z5111 Encounter for antineoplastic chemotherapy: Secondary | ICD-10-CM | POA: Diagnosis not present

## 2024-10-05 LAB — ECHOCARDIOGRAM COMPLETE
AR max vel: 1.79 cm2
AV Area VTI: 1.65 cm2
AV Area mean vel: 1.63 cm2
AV Mean grad: 3 mmHg
AV Peak grad: 5.3 mmHg
Ao pk vel: 1.15 m/s
Area-P 1/2: 3.62 cm2
Radius: 0.5 cm
S' Lateral: 3.2 cm

## 2024-10-05 MED ORDER — PEGFILGRASTIM-CBQV 6 MG/0.6ML ~~LOC~~ SOSY
6.0000 mg | PREFILLED_SYRINGE | Freq: Once | SUBCUTANEOUS | Status: AC
  Administered 2024-10-05: 6 mg via SUBCUTANEOUS
  Filled 2024-10-05: qty 0.6

## 2024-10-09 ENCOUNTER — Encounter: Payer: Self-pay | Admitting: Radiation Oncology

## 2024-10-09 ENCOUNTER — Other Ambulatory Visit: Payer: Self-pay | Admitting: Oncology

## 2024-10-09 ENCOUNTER — Ambulatory Visit: Payer: Self-pay | Admitting: Cardiology

## 2024-10-09 ENCOUNTER — Ambulatory Visit
Admission: RE | Admit: 2024-10-09 | Discharge: 2024-10-09 | Disposition: A | Discharge status: Home / Self Care | Source: Ambulatory Visit | Attending: Radiation Oncology | Admitting: Radiation Oncology

## 2024-10-09 VITALS — BP 137/67 | HR 76 | Temp 97.4°F | Resp 16 | Wt 143.0 lb

## 2024-10-09 DIAGNOSIS — I4891 Unspecified atrial fibrillation: Secondary | ICD-10-CM | POA: Insufficient documentation

## 2024-10-09 DIAGNOSIS — Z79624 Long term (current) use of inhibitors of nucleotide synthesis: Secondary | ICD-10-CM | POA: Insufficient documentation

## 2024-10-09 DIAGNOSIS — K219 Gastro-esophageal reflux disease without esophagitis: Secondary | ICD-10-CM | POA: Insufficient documentation

## 2024-10-09 DIAGNOSIS — Z79899 Other long term (current) drug therapy: Secondary | ICD-10-CM | POA: Insufficient documentation

## 2024-10-09 DIAGNOSIS — K5900 Constipation, unspecified: Secondary | ICD-10-CM | POA: Diagnosis not present

## 2024-10-09 DIAGNOSIS — N5089 Other specified disorders of the male genital organs: Secondary | ICD-10-CM

## 2024-10-09 DIAGNOSIS — Z9221 Personal history of antineoplastic chemotherapy: Secondary | ICD-10-CM | POA: Insufficient documentation

## 2024-10-09 DIAGNOSIS — Z85828 Personal history of other malignant neoplasm of skin: Secondary | ICD-10-CM | POA: Insufficient documentation

## 2024-10-09 DIAGNOSIS — I2510 Atherosclerotic heart disease of native coronary artery without angina pectoris: Secondary | ICD-10-CM | POA: Insufficient documentation

## 2024-10-09 DIAGNOSIS — E785 Hyperlipidemia, unspecified: Secondary | ICD-10-CM | POA: Insufficient documentation

## 2024-10-09 DIAGNOSIS — I10 Essential (primary) hypertension: Secondary | ICD-10-CM | POA: Diagnosis not present

## 2024-10-09 DIAGNOSIS — I5022 Chronic systolic (congestive) heart failure: Secondary | ICD-10-CM | POA: Insufficient documentation

## 2024-10-09 DIAGNOSIS — J90 Pleural effusion, not elsewhere classified: Secondary | ICD-10-CM | POA: Diagnosis not present

## 2024-10-09 DIAGNOSIS — C8336 Diffuse large B-cell lymphoma, intrapelvic lymph nodes: Secondary | ICD-10-CM | POA: Diagnosis present

## 2024-10-09 DIAGNOSIS — Z7952 Long term (current) use of systemic steroids: Secondary | ICD-10-CM | POA: Insufficient documentation

## 2024-10-09 DIAGNOSIS — I252 Old myocardial infarction: Secondary | ICD-10-CM | POA: Insufficient documentation

## 2024-10-09 NOTE — Consult Note (Signed)
 NEW PATIENT EVALUATION  Name: [name]  MRN: [id number]  Date:   10/09/2024     DOB: [birth year]   This 83 y.o. 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   PREVIOUS INVESTIGATIONS:  PET CT scan CT scans reviewed Pathology reports reviewed Clinical notes reviewed  HPI: Patient is an 83 year old male who presented in [place]  with testicular swelling.  He eventually was seen here by urology where ultrasound showed diffuse heterogeneous right testicular echotexture consistent with a dominant mass.  This favored lymphoproliferative process such as lymphoma.  This was confirmed on CT scan again showing an heterogeneous enhancement in both testicles with substantial abnormal large in the right testicle suspicious for testicular neoplasm.  PET scan confirmed a lobular appearance of the scrotum with soft tissue is seen on the prior CT scans with hypermetabolic activity consistent with neoplasm.  Had bilateral small hilar lymph nodes only mildly hypermetabolic.  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  Patient has undergone 6 cycles of mini CHOP chemotherapy.  Also received intrathecal methotrexate .  His most recent PET CT scan back in August showed interval right orchiectomy with small amount of residual Duvall for activity in the right scrotal sac nonspecific he did have some small hypermetabolic bilateral hilar nodes.  He has completed his chemotherapy.  He is seen today for consideration of involved field radiation to his scrotum.  He specifically denies any lower urinary tract symptoms.  He does tends towards  constipation.  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    PAST SURGICAL HISTORY:  Past Surgical History:  Procedure Laterality Date   BACK SURGERY     Lumbar   BALLOON DILATION N/A 06/29/2024   Procedure: BALLOON DILATION;  Surgeon: Stacia Glendia BRAVO, MD;  Location: [facility]COPY;  Service: Gastroenterology;  Laterality: N/A;   BUNIONECTOMY     CATARACT EXTRACTION, BILATERAL     COLONOSCOPY     CORONARY ARTERY BYPASS GRAFT N/A 01/08/2020   Procedure: CORONARY ARTERY BYPASS GRAFTING (CABG) x 4  WITH ENDOSCOPIC HARVESTING OF BILATERAL GREATER SAPHENOUS VEINS;  Surgeon: Fleeta Hanford Coy, MD;  Location: [facility] OR;  Service: Open Heart Surgery;  Laterality: N/A;   ESOPHAGOGASTRODUODENOSCOPY N/A 06/29/2024   Procedure: EGD (ESOPHAGOGASTRODUODENOSCOPY);  Surgeon: Stacia Glendia BRAVO, MD;  Location: [facility] ENDOSCOPY;  Service: Gastroenterology;  Laterality: N/A;   HEMORRHOID SURGERY  2004   HYDROCELE EXCISION     INGUINAL HERNIA REPAIR  01/2010   IR IMAGING GUIDED PORT INSERTION  06/02/2024   LEFT HEART CATH AND CORONARY ANGIOGRAPHY N/A 01/08/2020   Procedure: LEFT HEART CATH AND CORONARY ANGIOGRAPHY;  Surgeon: Darron Deatrice LABOR, MD;  Location: [facility] INVASIVE CV LAB;  Service: Cardiovascular;  Laterality: N/A;   ORCHIECTOMY Right 05/12/2024  Procedure: ORCHIECTOMY;  Surgeon: Francisca Redell BROCKS, MD;  Location: [facility] ORS;  Service: Urology;  Laterality: Right;   ROTATOR CUFF REPAIR Left 06/2010   left/ bone spur Dr.Blackman   SHOULDER ARTHROSCOPY WITH OPEN ROTATOR CUFF REPAIR Right 05/26/2018   Procedure: SHOULDER ARTHROSCOPY WITH OPEN ROTATOR CUFF REPAIR;  Surgeon: Edie Norleen PARAS, MD;  Location: [facility] ORS;  Service: Orthopedics;  Laterality: Right;  debridement, decompression   SKIN CANCER EXCISION  2003-2005   Mohs-right shoulder   TEE WITHOUT CARDIOVERSION N/A 01/08/2020   Procedure: TRANSESOPHAGEAL ECHOCARDIOGRAM (TEE);  Surgeon: Fleeta Ochoa, Maude, MD;  Location: [facility] OR;  Service: Open Heart Surgery;  Laterality: N/A;   TOTAL HIP ARTHROPLASTY Right 07/14/2022   Procedure: TOTAL HIP ARTHROPLASTY ANTERIOR APPROACH;  Surgeon: Kathlynn Sharper, MD;  Location: [facility] ORS;  Service: Orthopedics;  Laterality: Right;    FAMILY HISTORY: family history includes Alzheimer's disease in his mother; Heart attack in his father; Heart disease in his brother and mother; Stroke in his brother.  SOCIAL HISTORY:  reports that he has never smoked. He has never used smokeless tobacco. He reports that he does not currently use alcohol after a past usage of about 1.0 standard drink of alcohol per week. He reports that he does not use drugs.  ALLERGIES: Crestor [rosuvastatin], Penicillins, Peanut-containing drug products, Atorvastatin, and Simvastatin  MEDICATIONS:  Current Outpatient Medications  Medication Sig Dispense Refill   acetaminophen  (TYLENOL ) 500 MG tablet Take 500-1,000 mg by mouth every 6 (six) hours as needed for moderate pain (pain score 4-6).     acyclovir  (ZOVIRAX ) 400 MG tablet Take 1 tablet (400 mg total) by mouth daily. 30 tablet 3   allopurinol  (ZYLOPRIM ) 300 MG tablet TAKE 1 TABLET(300 MG) BY MOUTH DAILY 30 tablet 3   calcium  carbonate (TUMS - DOSED IN MG ELEMENTAL CALCIUM ) 500 MG chewable tablet Chew 2 tablets (400 mg of elemental calcium  total) by mouth daily.     docusate sodium   (COLACE) 100 MG capsule Take 1 capsule (100 mg total) by mouth 2 (two) times daily. 10 capsule 0   Evolocumab  (REPATHA  SURECLICK) 140 MG/ML SOAJ Inject 140 mg into the skin every 14 (fourteen) days. 6 mL 3   fluconazole  (DIFLUCAN ) 200 MG tablet Take 1 tablet (200 mg total) by mouth daily. 30 tablet 0   gabapentin  (NEURONTIN ) 100 MG capsule TAKE 1 CAPSULE(100 MG) BY MOUTH AT BEDTIME 30 capsule 0   lidocaine -prilocaine  (EMLA ) cream Apply to affected area once 30 g 3   losartan  (COZAAR ) 50 MG tablet Take 1 tablet (50 mg total) by mouth daily. 90 tablet 3   magic mouthwash (multi-ingredient) oral suspension Swish and spit 5-10 mLs 4 (four) times daily as needed for mouth pain. 480 mL 1   metoprolol  succinate (TOPROL -XL) 25 MG 24 hr tablet Take 0.5 tablets (12.5 mg total) by mouth daily. 45 tablet 3   nitroGLYCERIN  (NITROSTAT ) 0.4 MG SL tablet Place 0.4 mg under the tongue every 5 (five) minutes as needed for chest pain.     omeprazole  (PRILOSEC) 20 MG capsule Take 1 capsule (20 mg total) by mouth daily. 90 capsule 1   ondansetron  (ZOFRAN ) 8 MG tablet Take 1 tablet (8 mg total) by mouth every 8 (eight) hours as needed for nausea or vomiting. Start on the third day after cyclophosphamide  chemotherapy. 30 tablet 1   predniSONE  (DELTASONE ) 50 MG tablet Take 2 tablets (100 mg total) by mouth See admin instructions. Take 100mg  daily on Day 2-5 of chemotherapy.  prochlorperazine  (COMPAZINE ) 10 MG tablet Take 1 tablet (10 mg total) by mouth every 6 (six) hours as needed for nausea or vomiting. 30 tablet 6   sildenafil (VIAGRA) 100 MG tablet Take 0.5-1 tablets (50-100 mg total) by mouth daily as needed for erectile dysfunction (take 1 hour prior to sexual activity on an empty stomach). 20 tablet 6   tadalafil  (CIALIS ) 10 MG tablet Take 1-2 tablets (10-20 mg total) by mouth daily as needed for erectile dysfunction (take 1 hour prior to sexual activity). 30 tablet 11   traMADol  (ULTRAM ) 50 MG tablet Take 1 tablet  (50 mg total) by mouth every 6 (six) hours. 30 tablet 0   No current facility-administered medications for this encounter.    ECOG PERFORMANCE STATUS:  0 - Asymptomatic  REVIEW OF SYSTEMS: Patient denies any weight loss, fatigue, weakness, fever, chills or night sweats. Patient denies any loss of vision, blurred vision. Patient denies any ringing  of the ears or hearing loss. No irregular heartbeat. Patient denies heart murmur or history of fainting. Patient denies any chest pain or pain radiating to her upper extremities. Patient denies any shortness of breath, difficulty breathing at night, cough or hemoptysis. Patient denies any swelling in the lower legs. Patient denies any nausea vomiting, vomiting of blood, or coffee ground material in the vomitus. Patient denies any stomach pain. Patient states has had normal bowel movements no significant constipation or diarrhea. Patient denies any dysuria, hematuria or significant nocturia. Patient denies any problems walking, swelling in the joints or loss of balance. Patient denies any skin changes, loss of hair or loss of weight. Patient denies any excessive worrying or anxiety or significant depression. Patient denies any problems with insomnia. Patient denies excessive thirst, polyuria, polydipsia. Patient denies any swollen glands, patient denies easy bruising or easy bleeding. Patient denies any recent infections, allergies or URI. Patient s visual fields have not changed significantly in recent time.   PHYSICAL EXAM: BP 137/67   Pulse 76   Temp (!) 97.4 F (36.3 C)   Resp 16   Wt 143 lb (64.9 kg)   BMI 23.08 kg/m  Elderly male in NAD.  Examination of the scrotum shows no evidence of mass or nodularity no inguinal adenopathy is appreciated.  Patient is an uncircumcised male.  Well-developed well-nourished patient in NAD. HEENT reveals PERLA, EOMI, discs not visualized.  Oral cavity is clear. No oral mucosal lesions are identified. Neck is clear  without evidence of cervical or supraclavicular adenopathy. Lungs are clear to A&P. Cardiac examination is essentially unremarkable with regular rate and rhythm without murmur rub or thrill. Abdomen is benign with no organomegaly or masses noted. Motor sensory and DTR levels are equal and symmetric in the upper and lower extremities. Cranial nerves II through XII are grossly intact. Proprioception is intact. No peripheral adenopathy or edema is identified. No motor or sensory levels are noted. Crude visual fields are within normal range.  LABORATORY DATA: Pathology cytology reports reviewed    RADIOLOGY RESULTS: PET/CT CT scans reviewed compatible with above-stated findings   IMPRESSION: Large B-cell lymphoma of the testes immuno privilege site status post chemotherapy and 83 year old male  PLAN: At this time based on NCCN guidelines would like to treat his scrotum with adjuvant radiation therapy.  Will plan on delivering 40 Gray in 20 fractions.  Would use IMRT treatment planning and delivery to spare critical structures such as his penis small bowel and rectum.  Risks and benefits of treatment including skin reaction  fatigue alteration of blood counts all were reviewed with the patient in detail.  I have personally set up and ordered CT simulation.  Patient comprehends my recommendations well.  I would like to take this opportunity to thank you for allowing me to participate in the care of your patient.SABRA Marcey Penton, MD

## 2024-10-11 ENCOUNTER — Ambulatory Visit
Admission: RE | Admit: 2024-10-11 | Discharge: 2024-10-11 | Disposition: A | Discharge status: Home / Self Care | Source: Ambulatory Visit | Attending: Radiation Oncology | Admitting: Radiation Oncology

## 2024-10-11 DIAGNOSIS — C83398 Diffuse large b-cell lymphoma of other extranodal and solid organ sites: Secondary | ICD-10-CM | POA: Insufficient documentation

## 2024-10-11 LAB — COMP PANEL: LEUKEMIA/LYMPHOMA

## 2024-10-17 ENCOUNTER — Inpatient Hospital Stay

## 2024-10-17 DIAGNOSIS — Z5111 Encounter for antineoplastic chemotherapy: Secondary | ICD-10-CM | POA: Diagnosis not present

## 2024-10-17 DIAGNOSIS — C8580 Other specified types of non-Hodgkin lymphoma, unspecified site: Secondary | ICD-10-CM

## 2024-10-17 LAB — CBC WITH DIFFERENTIAL (CANCER CENTER ONLY)
Abs Immature Granulocytes: 1.52 K/uL — ABNORMAL HIGH (ref 0.00–0.07)
Basophils Absolute: 0.1 K/uL (ref 0.0–0.1)
Basophils Relative: 1 %
Eosinophils Absolute: 0 K/uL (ref 0.0–0.5)
Eosinophils Relative: 0 %
HCT: 32.8 % — ABNORMAL LOW (ref 39.0–52.0)
Hemoglobin: 10.8 g/dL — ABNORMAL LOW (ref 13.0–17.0)
Immature Granulocytes: 16 %
Lymphocytes Relative: 11 %
Lymphs Abs: 1.1 K/uL (ref 0.7–4.0)
MCH: 32.1 pg (ref 26.0–34.0)
MCHC: 32.9 g/dL (ref 30.0–36.0)
MCV: 97.6 fL (ref 80.0–100.0)
Monocytes Absolute: 1 K/uL (ref 0.1–1.0)
Monocytes Relative: 10 %
Neutro Abs: 5.9 K/uL (ref 1.7–7.7)
Neutrophils Relative %: 62 %
Platelet Count: 176 K/uL (ref 150–400)
RBC: 3.36 MIL/uL — ABNORMAL LOW (ref 4.22–5.81)
RDW: 14 % (ref 11.5–15.5)
Smear Review: NORMAL
WBC Count: 9.6 K/uL (ref 4.0–10.5)
nRBC: 0 % (ref 0.0–0.2)

## 2024-10-17 LAB — SAMPLE TO BLOOD BANK

## 2024-10-19 DIAGNOSIS — C83398 Diffuse large b-cell lymphoma of other extranodal and solid organ sites: Secondary | ICD-10-CM | POA: Diagnosis not present

## 2024-10-20 ENCOUNTER — Other Ambulatory Visit: Payer: Self-pay | Admitting: *Deleted

## 2024-10-20 DIAGNOSIS — N5089 Other specified disorders of the male genital organs: Secondary | ICD-10-CM

## 2024-10-24 ENCOUNTER — Other Ambulatory Visit: Payer: Self-pay

## 2024-10-24 ENCOUNTER — Ambulatory Visit
Admission: RE | Admit: 2024-10-24 | Discharge: 2024-10-24 | Disposition: A | Discharge status: Home / Self Care | Source: Ambulatory Visit | Attending: Radiation Oncology | Admitting: Radiation Oncology

## 2024-10-24 DIAGNOSIS — C83398 Diffuse large b-cell lymphoma of other extranodal and solid organ sites: Secondary | ICD-10-CM | POA: Insufficient documentation

## 2024-10-24 LAB — RAD ONC ARIA SESSION SUMMARY
Course Elapsed Days: 0
Plan Fractions Treated to Date: 1
Plan Prescribed Dose Per Fraction: 1.8 Gy
Plan Total Fractions Prescribed: 22
Plan Total Prescribed Dose: 39.6 Gy
Reference Point Dosage Given to Date: 1.8 Gy
Reference Point Session Dosage Given: 1.8 Gy
Session Number: 1

## 2024-10-25 ENCOUNTER — Other Ambulatory Visit: Payer: Self-pay

## 2024-10-25 ENCOUNTER — Ambulatory Visit
Admission: RE | Admit: 2024-10-25 | Discharge: 2024-10-25 | Disposition: A | Discharge status: Home / Self Care | Source: Ambulatory Visit | Attending: Radiation Oncology | Admitting: Radiation Oncology

## 2024-10-25 LAB — RAD ONC ARIA SESSION SUMMARY
Course Elapsed Days: 1
Plan Fractions Treated to Date: 2
Plan Prescribed Dose Per Fraction: 1.8 Gy
Plan Total Fractions Prescribed: 22
Plan Total Prescribed Dose: 39.6 Gy
Reference Point Dosage Given to Date: 3.6 Gy
Reference Point Session Dosage Given: 1.8 Gy
Session Number: 2

## 2024-10-26 ENCOUNTER — Other Ambulatory Visit: Payer: Self-pay

## 2024-10-26 ENCOUNTER — Inpatient Hospital Stay: Attending: Oncology

## 2024-10-26 ENCOUNTER — Ambulatory Visit
Admission: RE | Admit: 2024-10-26 | Discharge: 2024-10-26 | Disposition: A | Discharge status: Home / Self Care | Source: Ambulatory Visit | Attending: Radiation Oncology | Admitting: Radiation Oncology

## 2024-10-26 DIAGNOSIS — D6481 Anemia due to antineoplastic chemotherapy: Secondary | ICD-10-CM | POA: Insufficient documentation

## 2024-10-26 DIAGNOSIS — N5089 Other specified disorders of the male genital organs: Secondary | ICD-10-CM

## 2024-10-26 DIAGNOSIS — E871 Hypo-osmolality and hyponatremia: Secondary | ICD-10-CM | POA: Insufficient documentation

## 2024-10-26 DIAGNOSIS — Z9079 Acquired absence of other genital organ(s): Secondary | ICD-10-CM | POA: Insufficient documentation

## 2024-10-26 DIAGNOSIS — C83398 Diffuse large b-cell lymphoma of other extranodal and solid organ sites: Secondary | ICD-10-CM | POA: Insufficient documentation

## 2024-10-26 LAB — RAD ONC ARIA SESSION SUMMARY
Course Elapsed Days: 2
Plan Fractions Treated to Date: 3
Plan Prescribed Dose Per Fraction: 1.8 Gy
Plan Total Fractions Prescribed: 22
Plan Total Prescribed Dose: 39.6 Gy
Reference Point Dosage Given to Date: 5.4 Gy
Reference Point Session Dosage Given: 1.8 Gy
Session Number: 3

## 2024-10-26 LAB — CBC (CANCER CENTER ONLY)
HCT: 32.4 % — ABNORMAL LOW (ref 39.0–52.0)
Hemoglobin: 10.9 g/dL — ABNORMAL LOW (ref 13.0–17.0)
MCH: 32.1 pg (ref 26.0–34.0)
MCHC: 33.6 g/dL (ref 30.0–36.0)
MCV: 95.3 fL (ref 80.0–100.0)
Platelet Count: 170 K/uL (ref 150–400)
RBC: 3.4 MIL/uL — ABNORMAL LOW (ref 4.22–5.81)
RDW: 13.9 % (ref 11.5–15.5)
WBC Count: 6.6 K/uL (ref 4.0–10.5)
nRBC: 0 % (ref 0.0–0.2)

## 2024-10-27 ENCOUNTER — Other Ambulatory Visit: Payer: Self-pay

## 2024-10-27 ENCOUNTER — Ambulatory Visit
Admission: RE | Admit: 2024-10-27 | Discharge: 2024-10-27 | Disposition: A | Discharge status: Home / Self Care | Source: Ambulatory Visit | Attending: Radiation Oncology | Admitting: Radiation Oncology

## 2024-10-27 LAB — RAD ONC ARIA SESSION SUMMARY
Course Elapsed Days: 3
Plan Fractions Treated to Date: 4
Plan Prescribed Dose Per Fraction: 1.8 Gy
Plan Total Fractions Prescribed: 22
Plan Total Prescribed Dose: 39.6 Gy
Reference Point Dosage Given to Date: 7.2 Gy
Reference Point Session Dosage Given: 1.8 Gy
Session Number: 4

## 2024-10-30 ENCOUNTER — Other Ambulatory Visit: Payer: Self-pay

## 2024-10-30 ENCOUNTER — Ambulatory Visit
Admission: RE | Admit: 2024-10-30 | Discharge: 2024-10-30 | Disposition: A | Discharge status: Home / Self Care | Source: Ambulatory Visit | Attending: Radiation Oncology | Admitting: Radiation Oncology

## 2024-10-30 LAB — RAD ONC ARIA SESSION SUMMARY
Course Elapsed Days: 6
Plan Fractions Treated to Date: 5
Plan Prescribed Dose Per Fraction: 1.8 Gy
Plan Total Fractions Prescribed: 22
Plan Total Prescribed Dose: 39.6 Gy
Reference Point Dosage Given to Date: 9 Gy
Reference Point Session Dosage Given: 1.8 Gy
Session Number: 5

## 2024-10-31 ENCOUNTER — Inpatient Hospital Stay: Admitting: Oncology

## 2024-10-31 ENCOUNTER — Other Ambulatory Visit: Payer: Self-pay

## 2024-10-31 ENCOUNTER — Inpatient Hospital Stay

## 2024-10-31 ENCOUNTER — Telehealth: Payer: Self-pay

## 2024-10-31 ENCOUNTER — Encounter: Payer: Self-pay | Admitting: Oncology

## 2024-10-31 ENCOUNTER — Ambulatory Visit
Admission: RE | Admit: 2024-10-31 | Discharge: 2024-10-31 | Disposition: A | Discharge status: Home / Self Care | Source: Ambulatory Visit | Attending: Radiation Oncology | Admitting: Radiation Oncology

## 2024-10-31 VITALS — BP 137/57 | HR 59 | Temp 96.9°F | Resp 20 | Wt 132.9 lb

## 2024-10-31 DIAGNOSIS — C8589 Other specified types of non-Hodgkin lymphoma, extranodal and solid organ sites: Secondary | ICD-10-CM | POA: Diagnosis not present

## 2024-10-31 DIAGNOSIS — E871 Hypo-osmolality and hyponatremia: Secondary | ICD-10-CM

## 2024-10-31 DIAGNOSIS — C8580 Other specified types of non-Hodgkin lymphoma, unspecified site: Secondary | ICD-10-CM

## 2024-10-31 DIAGNOSIS — D6481 Anemia due to antineoplastic chemotherapy: Secondary | ICD-10-CM

## 2024-10-31 DIAGNOSIS — T451X5A Adverse effect of antineoplastic and immunosuppressive drugs, initial encounter: Secondary | ICD-10-CM

## 2024-10-31 DIAGNOSIS — Z95828 Presence of other vascular implants and grafts: Secondary | ICD-10-CM | POA: Insufficient documentation

## 2024-10-31 LAB — CMP (CANCER CENTER ONLY)
ALT: 18 U/L (ref 0–44)
AST: 28 U/L (ref 15–41)
Albumin: 3.7 g/dL (ref 3.5–5.0)
Alkaline Phosphatase: 118 U/L (ref 38–126)
Anion gap: 7 (ref 5–15)
BUN: 11 mg/dL (ref 8–23)
CO2: 25 mmol/L (ref 22–32)
Calcium: 8.6 mg/dL — ABNORMAL LOW (ref 8.9–10.3)
Chloride: 102 mmol/L (ref 98–111)
Creatinine: 0.6 mg/dL — ABNORMAL LOW (ref 0.61–1.24)
GFR, Estimated: >60 mL/min (ref >60)
Glucose, Bld: 134 mg/dL — ABNORMAL HIGH (ref 70–99)
Potassium: 4 mmol/L (ref 3.5–5.1)
Sodium: 134 mmol/L — ABNORMAL LOW (ref 135–145)
Total Bilirubin: 0.7 mg/dL (ref 0.0–1.2)
Total Protein: 6 g/dL — ABNORMAL LOW (ref 6.5–8.1)

## 2024-10-31 LAB — CBC WITH DIFFERENTIAL (CANCER CENTER ONLY)
Abs Immature Granulocytes: 0.05 K/uL (ref 0.00–0.07)
Basophils Absolute: 0 K/uL (ref 0.0–0.1)
Basophils Relative: 1 %
Eosinophils Absolute: 0.1 K/uL (ref 0.0–0.5)
Eosinophils Relative: 1 %
HCT: 31.2 % — ABNORMAL LOW (ref 39.0–52.0)
Hemoglobin: 10.6 g/dL — ABNORMAL LOW (ref 13.0–17.0)
Immature Granulocytes: 1 %
Lymphocytes Relative: 38 %
Lymphs Abs: 1.8 K/uL (ref 0.7–4.0)
MCH: 31.8 pg (ref 26.0–34.0)
MCHC: 34 g/dL (ref 30.0–36.0)
MCV: 93.7 fL (ref 80.0–100.0)
Monocytes Absolute: 0.7 K/uL (ref 0.1–1.0)
Monocytes Relative: 14 %
Neutro Abs: 2.1 K/uL (ref 1.7–7.7)
Neutrophils Relative %: 45 %
Platelet Count: 161 K/uL (ref 150–400)
RBC: 3.33 MIL/uL — ABNORMAL LOW (ref 4.22–5.81)
RDW: 13.6 % (ref 11.5–15.5)
WBC Count: 4.7 K/uL (ref 4.0–10.5)
nRBC: 0 % (ref 0.0–0.2)

## 2024-10-31 LAB — RAD ONC ARIA SESSION SUMMARY
Course Elapsed Days: 7
Plan Fractions Treated to Date: 6
Plan Prescribed Dose Per Fraction: 1.8 Gy
Plan Total Fractions Prescribed: 22
Plan Total Prescribed Dose: 39.6 Gy
Reference Point Dosage Given to Date: 10.8 Gy
Reference Point Session Dosage Given: 1.8 Gy
Session Number: 6

## 2024-10-31 LAB — SAMPLE TO BLOOD BANK

## 2024-10-31 LAB — LACTATE DEHYDROGENASE: LDH: 160 U/L (ref 105–235)

## 2024-10-31 NOTE — Assessment & Plan Note (Signed)
 Intermittent chronic issue for him.  He is asymptomatic.   Sodium level has improved.

## 2024-10-31 NOTE — Telephone Encounter (Signed)
 Patient will be out of town from December to April. Wife will call back in April 2026 to set up follow up, he will need to be scheduled for :  [facility] lab/ MD (cbc,cmp, ldh)

## 2024-10-31 NOTE — Progress Notes (Signed)
 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  Ki-67 70%,  Right orchiectomy pathology results were reviewed and discussed with patient. Recommend R Mini CHOP, intrathecal chemotherapy with methotrexate , radiation.  Prechemo MUGA showed LVEF 51%. FISH Bcl-2/BCL6/c-Myc are all negative.  Labs are reviewed and discussed with patient. PET3 showed partial response - improved but residual testicular activity, small bilateral hilar/infrahilar lymph nodes  S/p 6 cycles of  R - Mini CHOP with CSF support   5 cycles of Intrathecal methotrexate   Follow up with Radonc to complete scrotal radiation therapy Plan to repeat PET 10-12 weeks after treatment - he is going to [place]  at the end December 2025 and will stay there until April 2026.  I recommend patient to establish care with oncologist there and get PET scan done in Feb/March.     Anemia due to antineoplastic chemotherapy Hb has improved   Hypocalcemia Improved.  Recommend patient to take calcium  1200 mg every other day.  Take vitamin D supplementation.   Hyponatremia Intermittent chronic issue for him.  He is asymptomatic.   Sodium level has improved.    Port-A-Cath in place Recommend port flush Q6-8 weeks.will schedule port flush appt until he leaves [place]. Recommend him to get additional port flush during his stay in [place]     No orders of the defined types were placed in this encounter.  Follow up TBD All questions were answered. The patient knows to call the clinic with  any problems, questions or concerns.  Call Babara, MD, PhD [facility] Hematology Oncology 10/31/2024    HISTORY OF PRESENTING ILLNESS:  Jaquaveon Bilal Beauchaine 83 y.o. male presents to establish care for testicular lymphoma.   Oncology History  Lymphoma, large cell (HCC)  04/25/2024 Imaging   ultrasound scrotum with Doppler showed Diffusely heterogeneous right testicular echotexture, most consistent with dominant mass or masses. Multiple left testicular masses. Favor lymphoproliferative process such as lymphoma or leukemia. Bilateral primary testicular neoplasms felt less likely.   Right epididymal enlargement and heterogeneity, favoring epididymitis. The appearance of the testicles is felt unlikely to be be infectious/inflammatory.   04/28/2024 Imaging   CT chest with contrast abdomen pelvis with and without contrast showed 1. Heterogeneous enhancement in both testicles with substantial abnormal enlargement of the right testicle. This is suspicious for testicular neoplasm with lymphoblastic leukemia, lymphoma, or conceivably metastatic disease as top differential diagnostic considerations. Testicular adrenal rest tumors are a differential diagnostic consideration. 2. No overtly pathologic adenopathy identified. 3. Severe parenchymal atrophy of the pancreatic tail and body probably due to a 1.4 cm calcification/stone along the dorsal pancreatic duct at the junction of the body and head. Similar severe atrophy was shown on the CT chest from 01/07/2020. 4. Branching density in the superior segment right lower lobe favoring bronchiectasis with airway plugging. 5. Small type 1 hiatal hernia. 6. Prominent stool throughout the colon favors constipation. 7. Chronic appearing anterior wedge compressions at L1 and L2. 8. Lumbar spondylosis and degenerative disc disease at L5-S1 resulting in mild bilateral foraminal impingement. 9.  Aortic Atherosclerosis (ICD10-I70.0). 10. Nonspecific lesion  peripherally in  the right hepatic lobe. This could be further characterized with hepatic protocol MRI with and without contrast.   05/04/2024 Initial Diagnosis   Testicular lymphoma, large cell   He has experienced scrotal swelling since a hydrocele repair in 2022. The swelling decreased slightly post-surgery but never fully resolved. Recently, he received a report indicating concerns in both testicles, prompting further evaluation.    He reports neck soreness and swelling, initially attributed to physical activity such as painting and building a fence. The neck soreness is described as 'almost miserable.'   He mentions unintentional weight loss, noting a decrease from his usual weight of 135-140 pounds to 122 pounds over the past year. Despite efforts to regain weight, including going on cruises, he has not been successful. He currently weighs 133 pounds with clothes on.   No excessive night sweats. He feels cold most of the time but has experienced some fever. No family history of cancer but there is a family history of high cholesterol and heart problems. He has not had a PSA test but underwent a colonoscopy about three years ago.    05/24/2024 Cancer Staging   Staging form: Hodgkin and Non-Hodgkin Lymphoma, AJCC 8th Edition - Clinical stage from 05/24/2024: Stage III (Diffuse large B-cell lymphoma) - Signed by Babara Call, MD on 06/01/2024 Stage prefix: Initial diagnosis   06/16/2024 -  Chemotherapy   Patient is on Treatment Plan : NON-HODGKINS LYMPHOMA R-CHOP q21d     08/18/2024 Imaging   PET scan showed  1. Interval right orchiectomy with small amount of residual Deauville 4 activity in the right scrotal sac, nonspecific. 2. Small but hypermetabolic bilateral hilar/infrahilar lymph nodes, Deauville 4 on the right and Deauville 5 on the left. 3. Small faintly hypermetabolic AP window lymph node, Deauville 3. 4. Bandlike nodularity in the superior segment right lower lobe is unchanged and  demonstrates only low-grade metabolic activity. Surveillance suggested. 5. Chronic calcific pancreatitis with pancreatic atrophy. 6. Aortic Atherosclerosis (ICD10-I70.0). Coronary and systemic atherosclerosis.   S/p EGD, stretch of stenosis, finished Fluconazole  for esophageal candidiasis  Today patient reports feeling well.  He finished 6  cycles of R mini CHOP.  Status post 5 cycle of intrathecal methotrexate .   He is currently getting testicular radiation.  Denies any headache, nausea or vomiting.   No swallowing difficulties. No diarrhea. He has constipation and is on bowel regimen.    MEDICAL HISTORY:  Past Medical History:  Diagnosis Date   Arthritis    Bilateral pleural effusion    Bone spur 2010   right shoulder   CAD (coronary artery disease)    a. 12/2019 NSTEMI/Cath: LM 99ost/m, LAD 40, LCX nl, OM1 80, RCA 70ost/p, 99p, 30d, EF 45-50%; b. 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   Complication of anesthesia    a.) delayed emergence; b.) emergence delirium   Diverticulosis    GERD (gastroesophageal reflux disease)    H/O atrial flutter    HFrEF (heart failure with reduced ejection fraction) (HCC)    a.) TEE 01/08/2020: EF 45-50%; b.) TTE 04/14/2022: EF 50-55%, septal HK, G2DD, GLS /18.3%, mild-mod MR, AoV sclerosis without stenosis.   Hyperlipidemia    Hypertension    Hyponatremia    IBS (irritable bowel syndrome)    Ischemic cardiomyopathy    a. 12/2019 TEE: EF 45-50%. Nl RV fxn. Mild MR.   NSTEMI (non-ST elevated myocardial infarction) (HCC) 01/07/2020   a.) LHC 01/08/2020: EF 45-50%, LVEDP 22 mmHg; 99% o-mLM, 80% OM1, 40% mLAD, 70% o-pRCA,  99% pRCA, 30% dRCA --> consult CVTS; b.) 4v CABG 01/07/2022: LIMA-LAD, SVG-RPDA, SVG-OM1, SVG-OM2.   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   Skin cancer    a.) s/p Mohs surgery   Statin intolerance    Testicular mass     SURGICAL HISTORY: Past Surgical History:  Procedure Laterality Date   BACK SURGERY     Lumbar   BALLOON DILATION N/A 06/29/2024   Procedure: BALLOON DILATION;  Surgeon: Stacia Glendia BRAVO, MD;  Location: [facility] ENDOSCOPY;  Service: Gastroenterology;  Laterality: N/A;   BUNIONECTOMY     CATARACT EXTRACTION, BILATERAL     COLONOSCOPY     CORONARY ARTERY BYPASS GRAFT N/A 01/08/2020   Procedure: CORONARY ARTERY BYPASS GRAFTING (CABG) x 4  WITH ENDOSCOPIC HARVESTING OF BILATERAL GREATER SAPHENOUS VEINS;  Surgeon: Fleeta Hanford Coy, MD;  Location: [facility] OR;  Service: Open Heart Surgery;  Laterality: N/A;   ESOPHAGOGASTRODUODENOSCOPY N/A 06/29/2024   Procedure: EGD (ESOPHAGOGASTRODUODENOSCOPY);  Surgeon: Stacia Glendia BRAVO, MD;  Location: [facility] ENDOSCOPY;  Service: Gastroenterology;  Laterality: N/A;   HEMORRHOID SURGERY  2004   HYDROCELE EXCISION     INGUINAL HERNIA REPAIR  01/2010   IR IMAGING GUIDED PORT INSERTION  06/02/2024   LEFT HEART CATH AND CORONARY ANGIOGRAPHY N/A 01/08/2020   Procedure: LEFT HEART CATH AND CORONARY ANGIOGRAPHY;  Surgeon: Darron Deatrice LABOR, MD;  Location: [facility] INVASIVE CV LAB;  Service: Cardiovascular;  Laterality: N/A;   ORCHIECTOMY Right 05/12/2024   Procedure: ORCHIECTOMY;  Surgeon: Francisca Redell BROCKS, MD;  Location: [facility] ORS;  Service: Urology;  Laterality: Right;   ROTATOR CUFF REPAIR Left 06/2010   left/ bone spur Dr.Blackman   SHOULDER ARTHROSCOPY WITH OPEN ROTATOR CUFF REPAIR Right 05/26/2018   Procedure: SHOULDER ARTHROSCOPY WITH OPEN ROTATOR CUFF REPAIR;  Surgeon: Edie Norleen PARAS, MD;  Location: [facility] ORS;  Service: Orthopedics;  Laterality: Right;  debridement, decompression   SKIN CANCER EXCISION  2003-2005   Mohs-right shoulder   TEE WITHOUT CARDIOVERSION N/A 01/08/2020   Procedure: TRANSESOPHAGEAL ECHOCARDIOGRAM (TEE);  Surgeon: Fleeta Hanford, Coy, MD;   Location: [facility] OR;  Service: Open Heart Surgery;  Laterality: N/A;   TOTAL HIP ARTHROPLASTY Right 07/14/2022   Procedure: TOTAL HIP ARTHROPLASTY ANTERIOR APPROACH;  Surgeon: Kathlynn Sharper, MD;  Location: [facility] ORS;  Service: Orthopedics;  Laterality: Right;    SOCIAL HISTORY: Social History   Socioeconomic History   Marital status: Married    Spouse name: Not on file   Number of children: 2   Years of education: Not on file   Highest education level: Not on file  Occupational History   Occupation: retired Academic Librarian: retired  Tobacco Use   Smoking status: Never   Smokeless tobacco: Never  Vaping Use   Vaping status: Never Used  Substance and Sexual Activity   Alcohol use: Not Currently    Alcohol/week: 1.0 standard drink of alcohol    Types: 1 Glasses of wine per week    Comment: very rare   Drug use: No   Sexual activity: Not on file  Other Topics Concern   Not on file  Social History Narrative   2 sons, no contact   Splits his time between [place]  and [place]    Has live-in  since 2005   Has living will   Requests Bernice--girlfriend or brother Lamar, as health care POA   Would accept resuscitation but no prolonged artificial ventilation   Wouldn't want tube feeds if cognitively unaware   Social Drivers of Health   Financial Resource Strain: Low Risk  (08/01/2024)   Received from [facility]   Overall Financial Resource Strain (CARDIA)    Difficulty of Paying Living Expenses: Not hard at all  Food Insecurity: No Food Insecurity (08/01/2024)   Received from [facility]   Hunger Vital Sign    Within the past 12 months, you worried that your food would run out before you got the money to buy more.: Never true    Within the past 12 months, the food you bought just didn't last and you didn't have money to get more.: Never true  Transportation Needs: No Transportation Needs (08/01/2024)   Received from  [facility] - Transportation    In the past 12 months, has lack of transportation kept you from medical appointments or from getting medications?: No    Lack of Transportation (Non-Medical): No  Physical Activity: Not on file  Stress: Not on file  Social Connections: Not on file  Intimate Partner Violence: Not on file    FAMILY HISTORY: Family History  Problem Relation Age of Onset   Heart disease Mother    Alzheimer's disease Mother    Heart attack Father    Heart disease Brother    Stroke Brother    Cancer Neg Hx    Diabetes Neg Hx    Colon cancer Neg Hx    Esophageal cancer Neg Hx    Stomach cancer Neg Hx    Pancreatic cancer Neg Hx     ALLERGIES:  is allergic to crestor [rosuvastatin], penicillins, peanut-containing drug products, atorvastatin, and simvastatin.  MEDICATIONS:  Current Outpatient Medications  Medication Sig Dispense Refill   acetaminophen  (TYLENOL ) 500 MG tablet Take 500-1,000 mg by mouth every 6 (six) hours as needed for moderate pain (pain score 4-6).     calcium  carbonate (TUMS - DOSED IN MG ELEMENTAL CALCIUM ) 500 MG chewable tablet Chew 2 tablets (400 mg of elemental calcium  total) by mouth daily.     docusate sodium  (COLACE) 100 MG capsule Take 1 capsule (100 mg total) by mouth 2 (two) times daily. 10 capsule 0   Evolocumab  (REPATHA  SURECLICK) 140 MG/ML SOAJ Inject 140 mg into the skin every 14 (fourteen) days. 6 mL 3   gabapentin  (NEURONTIN ) 100 MG capsule TAKE 1 CAPSULE(100 MG) BY MOUTH AT BEDTIME 30 capsule 0   lidocaine -prilocaine  (EMLA ) cream Apply to affected area once 30 g 3   losartan  (COZAAR ) 50 MG tablet Take 1 tablet (50 mg total) by mouth daily. 90 tablet 3   metoprolol  succinate (TOPROL -XL) 25 MG 24 hr tablet Take 0.5 tablets (12.5 mg total) by mouth daily. 45 tablet 3   nitroGLYCERIN  (NITROSTAT ) 0.4 MG SL tablet Place 0.4 mg under the tongue every 5 (five) minutes as needed for chest pain.     omeprazole   (PRILOSEC) 20 MG capsule Take 1 capsule (20 mg total) by mouth daily. 90 capsule 1   ondansetron  (ZOFRAN ) 8 MG tablet Take 1 tablet (8 mg total) by mouth every 8 (eight) hours as needed for nausea or vomiting. Start on the third day after cyclophosphamide  chemotherapy. 30 tablet 1   prochlorperazine  (COMPAZINE ) 10 MG tablet Take 1 tablet (10 mg total)  by mouth every 6 (six) hours as needed for nausea or vomiting. 30 tablet 6   sildenafil (VIAGRA) 100 MG tablet Take 0.5-1 tablets (50-100 mg total) by mouth daily as needed for erectile dysfunction (take 1 hour prior to sexual activity on an empty stomach). 20 tablet 6   tadalafil  (CIALIS ) 10 MG tablet Take 1-2 tablets (10-20 mg total) by mouth daily as needed for erectile dysfunction (take 1 hour prior to sexual activity). 30 tablet 11   traMADol  (ULTRAM ) 50 MG tablet Take 1 tablet (50 mg total) by mouth every 6 (six) hours. 30 tablet 0   magic mouthwash (multi-ingredient) oral suspension Swish and spit 5-10 mLs 4 (four) times daily as needed for mouth pain. (Patient not taking: Reported on 10/31/2024) 480 mL 1   No current facility-administered medications for this visit.    Review of Systems  Constitutional:  Positive for fatigue. Negative for appetite change, chills, fever and unexpected weight change.  HENT:   Negative for hearing loss and voice change.   Eyes:  Negative for eye problems and icterus.  Respiratory:  Negative for chest tightness, cough and shortness of breath.   Cardiovascular:  Negative for chest pain and leg swelling.  Gastrointestinal:  Negative for abdominal distention and abdominal pain.  Endocrine: Negative for hot flashes.  Genitourinary:  Negative for difficulty urinating, dysuria and frequency.        Status post right orchiectomy  Musculoskeletal:  Positive for arthralgias.  Skin:  Negative for itching and rash.  Neurological:  Negative for light-headedness.  Hematological:  Negative for adenopathy. Does not  bruise/bleed easily.  Psychiatric/Behavioral:  Negative for confusion.      PHYSICAL EXAMINATION: ECOG PERFORMANCE STATUS: 1 - Symptomatic but completely ambulatory  There were no vitals filed for this visit.  There were no vitals filed for this visit.   Physical Exam Constitutional:      General: He is not in acute distress.    Appearance: He is not diaphoretic.  HENT:     Head: Normocephalic and atraumatic.  Eyes:     General: No scleral icterus. Cardiovascular:     Rate and Rhythm: Normal rate and regular rhythm.     Heart sounds: No murmur heard. Pulmonary:     Effort: Pulmonary effort is normal. No respiratory distress.     Breath sounds: No wheezing.  Abdominal:     General: There is no distension.     Palpations: Abdomen is soft.     Tenderness: There is no abdominal tenderness.  Musculoskeletal:        General: Normal range of motion.     Cervical back: Normal range of motion and neck supple.  Skin:    General: Skin is warm and dry.     Findings: No erythema.  Neurological:     Mental Status: He is alert and oriented to person, place, and time. Mental status is at baseline.     Motor: No abnormal muscle tone.  Psychiatric:        Mood and Affect: Affect normal.     LABORATORY DATA:  I have reviewed the data as listed    Latest Ref Rng & Units 10/31/2024    8:19 AM 10/26/2024   10:28 AM 10/17/2024    9:25 AM  CBC  WBC 4.0 - 10.5 K/uL 4.7  6.6  9.6   Hemoglobin 13.0 - 17.0 g/dL 89.3  89.0  89.1   Hematocrit 39.0 - 52.0 % 31.2  32.4  32.8  Platelets 150 - 400 K/uL 161  170  176       Latest Ref Rng & Units 10/31/2024    8:19 AM 10/03/2024    8:36 AM 09/12/2024    8:21 AM  CMP  Glucose 70 - 99 mg/dL 865  887  898   BUN 8 - 23 mg/dL 11  9  11    Creatinine 0.61 - 1.24 mg/dL 9.39  9.43  9.22   Sodium 135 - 145 mmol/L 134  130  131   Potassium 3.5 - 5.1 mmol/L 4.0  3.8  4.0   Chloride 98 - 111 mmol/L 102  99  99   CO2 22 - 32 mmol/L 25  23  24     Calcium  8.9 - 10.3 mg/dL 8.6  8.4  8.9   Total Protein 6.5 - 8.1 g/dL 6.0  6.0  6.2   Total Bilirubin 0.0 - 1.2 mg/dL 0.7  0.8  0.8   Alkaline Phos 38 - 126 U/L 118  129  146   AST 15 - 41 U/L 28  23  26    ALT 0 - 44 U/L 18  15  18       RADIOGRAPHIC STUDIES: I have personally reviewed the radiological images as listed and agreed with the findings in the report. ECHOCARDIOGRAM COMPLETE Result Date: 10/05/2024    ECHOCARDIOGRAM REPORT   Patient Name:   CHEROKEE CLOWERS Mccormac Date of Exam: 10/05/2024 Medical Rec #:  [id number]     Height:       66.0 in Accession #:    [id number]    Weight:       135.4 lb Date of Birth:  21-Aug-1941     BSA:          1.694 m Patient Age:    83 years      BP:           150/62 mmHg Patient Gender: M             HR:           69 bpm. Exam Location:  [facility] Procedure: 2D Echo, 3D Echo, Cardiac Doppler, Color Doppler and Strain Analysis            (Both Spectral and Color Flow Doppler were utilized during            procedure). Indications:    I50.9* Heart failure (unspecified)  History:        Patient has prior history of Echocardiogram examinations, most                 recent 04/14/2022. CHF and Cardiomyopathy, CAD and Previous                 Myocardial Infarction, Prior CABG, Arrythmias:Atrial Flutter and                 Bradycardia, Signs/Symptoms:Chest Pain and Shortness of Breath;                 Risk Factors:Non-Smoker, Dyslipidemia and Hypertension.  Sonographer:    Gordon Miyamoto Referring Phys: [id number] SHERI HAMMOCK  Sonographer Comments: Technically difficult study due to poor echo windows. Image acquisition challenging due to patient body habitus. IMPRESSIONS  1. Left ventricular ejection fraction, by estimation, is 50 to 55%. Left ventricular ejection fraction by 3D volume is 49 %. The left ventricle has low normal function. The left ventricle has no regional wall motion abnormalities. Left ventricular diastolic parameters are indeterminate. The average left  ventricular  global longitudinal strain is -18.0 %. The global longitudinal strain is normal.  2. Right ventricular systolic function is mildly reduced. The right ventricular size is mildly enlarged.  3. Left atrial size was moderately dilated.  4. The mitral valve is normal in structure. Mild to moderate mitral valve regurgitation. No evidence of mitral stenosis.  5. The aortic valve is normal in structure. Aortic valve regurgitation is mild. Aortic valve sclerosis is present, with no evidence of aortic valve stenosis.  6. The inferior vena cava is dilated in size with >50% respiratory variability, suggesting right atrial pressure of 8 mmHg. FINDINGS  Left Ventricle: Left ventricular ejection fraction, by estimation, is 50 to 55%. Left ventricular ejection fraction by 3D volume is 49 %. The left ventricle has low normal function. The left ventricle has no regional wall motion abnormalities. The average left ventricular global longitudinal strain is -18.0 %. Strain was performed and the global longitudinal strain is normal. The left ventricular internal cavity size was normal in size. There is no left ventricular hypertrophy. Left ventricular diastolic parameters are indeterminate. Right Ventricle: The right ventricular size is mildly enlarged. No increase in right ventricular wall thickness. Right ventricular systolic function is mildly reduced. Left Atrium: Left atrial size was moderately dilated. Right Atrium: Right atrial size was normal in size. Pericardium: There is no evidence of pericardial effusion. Mitral Valve: The mitral valve is normal in structure. There is mild calcification of the mitral valve leaflet(s). Mild to moderate mitral valve regurgitation. No evidence of mitral valve stenosis. Tricuspid Valve: The tricuspid valve is normal in structure. Tricuspid valve regurgitation is mild . No evidence of tricuspid stenosis. Aortic Valve: The aortic valve is normal in structure. Aortic valve regurgitation is mild.  Aortic valve sclerosis is present, with no evidence of aortic valve stenosis. Aortic valve mean gradient measures 3.0 mmHg. Aortic valve peak gradient measures 5.3  mmHg. Aortic valve area, by VTI measures 1.65 cm. Pulmonic Valve: The pulmonic valve was normal in structure. Pulmonic valve regurgitation is not visualized. No evidence of pulmonic stenosis. Aorta: The aortic root is normal in size and structure. Venous: The inferior vena cava is dilated in size with greater than 50% respiratory variability, suggesting right atrial pressure of 8 mmHg. IAS/Shunts: No atrial level shunt detected by color flow Doppler. Additional Comments: 3D was performed not requiring image post processing on an independent workstation and was indeterminate.  LEFT VENTRICLE PLAX 2D LVIDd:         5.10 cm         Diastology LVIDs:         3.20 cm         LV e' medial:    5.98 cm/s LV PW:         0.90 cm         LV E/e' medial:  14.5 LV IVS:        0.70 cm         LV e' lateral:   7.40 cm/s LVOT diam:     2.00 cm         LV E/e' lateral: 11.7 LV SV:         46 LV SV Index:   27              2D Longitudinal LVOT Area:     3.14 cm        Strain  2D Strain GLS   -18.0 %                                Avg:                                 3D Volume EF                                LV 3D EF:    Left                                             ventricul                                             ar                                             ejection                                             fraction                                             by 3D                                             volume is                                             49 %.                                 3D Volume EF:                                3D EF:        49 %                                LV EDV:       123 ml                                LV ESV:       63 ml  LV SV:        60 ml RIGHT  VENTRICLE            IVC RV Basal diam:  3.90 cm    IVC diam: 2.00 cm RV Mid diam:    2.90 cm RV S prime:     9.03 cm/s TAPSE (M-mode): 1.6 cm LEFT ATRIUM              Index        RIGHT ATRIUM           Index LA diam:        4.20 cm  2.48 cm/m   RA Area:     18.70 cm LA Vol (A2C):   118.0 ml 69.65 ml/m  RA Volume:   48.40 ml  28.57 ml/m LA Vol (A4C):   42.1 ml  24.85 ml/m LA Biplane Vol: 75.9 ml  44.80 ml/m  AORTIC VALVE AV Area (Vmax):    1.79 cm AV Area (Vmean):   1.63 cm AV Area (VTI):     1.65 cm AV Vmax:           115.00 cm/s AV Vmean:          80.800 cm/s AV VTI:            0.281 m AV Peak Grad:      5.3 mmHg AV Mean Grad:      3.0 mmHg LVOT Vmax:         65.50 cm/s LVOT Vmean:        41.900 cm/s LVOT VTI:          0.148 m LVOT/AV VTI ratio: 0.53  AORTA Ao Sinus diam: 3.20 cm MITRAL VALVE               TRICUSPID VALVE MV Area (PHT): 3.62 cm    TR Peak grad:   36.7 mmHg MV Decel Time: 210 msec    TR Vmax:        303.00 cm/s MR PISA:        1.57 cm MR PISA Radius: 0.50 cm    SHUNTS MV E velocity: 86.75 cm/s  Systemic VTI:  0.15 m MV A velocity: 30.40 cm/s  Systemic Diam: 2.00 cm MV E/A ratio:  2.85 Evalene Lunger MD Electronically signed by Evalene Lunger MD Signature Date/Time: 10/05/2024/5:28:47 PM    Final    DG FL LUMBAR PUNCTURE W/CHEMO INJECT Result Date: 10/04/2024 CLINICAL DATA:  83 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 Patient presents for fluoroscopic guided lumbar puncture with instillation of intrathecal methotrexate . EXAM: FLUOROSCOPICALLY GUIDED LUMBAR PUNCTURE FOR INTRATHECAL CHEMOTHERAPY PROCEDURE: Informed consent was obtained from the patient prior to the procedure, including potential complications of headache, allergy, and pain. A 'time out' was performed. An appropriate skin entry site was determined fluoroscopically. Operator donned sterile gloves and mask. Skin site was marked, then prepped with Betadine, draped in usual sterile fashion, and infiltrated locally with 1%  lidocaine . A 20 gauge spinal needle advanced into the thecal sac at L4-5 from a right interlaminar approach. Clear colorless CSF spontaneously returned, with opening pressure of 16 cm water . 4 ml CSF were collected for the requested laboratory study. Dr. Donnice Mania then completed the 5mL injection of methotrexate  into the subarachnoid space. The needle was then removed. The patient tolerated the procedure well and there were no complications. FLUOROSCOPY: Radiation Exposure Index (as provided by the fluoroscopic device): 10 mGy Kerma IMPRESSION: Technically successful lumbar puncture under fluoroscopy. This exam was  performed by Sherrilee Bal, PA-C and Dr. Donnice Mania. Electronically Signed   By: Donnice Mania M.D.   On: 10/04/2024 14:57

## 2024-10-31 NOTE — Assessment & Plan Note (Signed)
 Hb has improved

## 2024-10-31 NOTE — Assessment & Plan Note (Signed)
 Recommend port flush Q6-8 weeks.will schedule port flush appt until he leaves [place]. Recommend him to get additional port flush during his stay in [place] 

## 2024-10-31 NOTE — Assessment & Plan Note (Addendum)
 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  Ki-67 70%,  Right orchiectomy pathology results were reviewed and discussed with patient. Recommend R Mini CHOP, intrathecal chemotherapy with methotrexate , radiation.  Prechemo MUGA showed LVEF 51%. FISH Bcl-2/BCL6/c-Myc are all negative.  Labs are reviewed and discussed with patient. PET3 showed partial response - improved but residual testicular activity, small bilateral hilar/infrahilar lymph nodes  S/p 6 cycles of  R - Mini CHOP with CSF support   5 cycles of Intrathecal methotrexate   Follow up with Radonc to complete scrotal radiation therapy Plan to repeat PET 10-12 weeks after treatment - he is going to [place]  at the end December 2025 and will stay there until April 2026.  I recommend patient to establish care with oncologist there and get PET scan done in Feb/March.

## 2024-10-31 NOTE — Assessment & Plan Note (Signed)
 Improved.  Recommend patient to take calcium  1200 mg every other day.  Take vitamin D supplementation.

## 2024-10-31 NOTE — Patient Instructions (Signed)

## 2024-11-01 ENCOUNTER — Other Ambulatory Visit: Payer: Self-pay

## 2024-11-01 ENCOUNTER — Ambulatory Visit
Admission: RE | Admit: 2024-11-01 | Discharge: 2024-11-01 | Disposition: A | Discharge status: Home / Self Care | Source: Ambulatory Visit | Attending: Radiation Oncology | Admitting: Radiation Oncology

## 2024-11-01 LAB — RAD ONC ARIA SESSION SUMMARY
Course Elapsed Days: 8
Plan Fractions Treated to Date: 7
Plan Prescribed Dose Per Fraction: 1.8 Gy
Plan Total Fractions Prescribed: 22
Plan Total Prescribed Dose: 39.6 Gy
Reference Point Dosage Given to Date: 12.6 Gy
Reference Point Session Dosage Given: 1.8 Gy
Session Number: 7

## 2024-11-02 ENCOUNTER — Other Ambulatory Visit: Payer: Self-pay

## 2024-11-02 ENCOUNTER — Ambulatory Visit
Admission: RE | Admit: 2024-11-02 | Discharge: 2024-11-02 | Disposition: A | Discharge status: Home / Self Care | Source: Ambulatory Visit | Attending: Radiation Oncology | Admitting: Radiation Oncology

## 2024-11-02 ENCOUNTER — Inpatient Hospital Stay

## 2024-11-02 LAB — RAD ONC ARIA SESSION SUMMARY
Course Elapsed Days: 9
Plan Fractions Treated to Date: 8
Plan Prescribed Dose Per Fraction: 1.8 Gy
Plan Total Fractions Prescribed: 22
Plan Total Prescribed Dose: 39.6 Gy
Reference Point Dosage Given to Date: 14.4 Gy
Reference Point Session Dosage Given: 1.8 Gy
Session Number: 8

## 2024-11-03 ENCOUNTER — Other Ambulatory Visit: Payer: Self-pay

## 2024-11-03 ENCOUNTER — Ambulatory Visit
Admission: RE | Admit: 2024-11-03 | Discharge: 2024-11-03 | Disposition: A | Discharge status: Home / Self Care | Source: Ambulatory Visit | Attending: Radiation Oncology | Admitting: Radiation Oncology

## 2024-11-03 LAB — RAD ONC ARIA SESSION SUMMARY
Course Elapsed Days: 10
Plan Fractions Treated to Date: 9
Plan Prescribed Dose Per Fraction: 1.8 Gy
Plan Total Fractions Prescribed: 22
Plan Total Prescribed Dose: 39.6 Gy
Reference Point Dosage Given to Date: 16.2 Gy
Reference Point Session Dosage Given: 1.8 Gy
Session Number: 9

## 2024-11-06 ENCOUNTER — Ambulatory Visit
Admission: RE | Admit: 2024-11-06 | Discharge: 2024-11-06 | Disposition: A | Source: Ambulatory Visit | Attending: Radiation Oncology | Admitting: Radiation Oncology

## 2024-11-06 ENCOUNTER — Other Ambulatory Visit: Payer: Self-pay

## 2024-11-06 ENCOUNTER — Ambulatory Visit: Admission: RE | Admit: 2024-11-06 | Source: Ambulatory Visit

## 2024-11-06 LAB — RAD ONC ARIA SESSION SUMMARY
Course Elapsed Days: 13
Plan Fractions Treated to Date: 10
Plan Prescribed Dose Per Fraction: 1.8 Gy
Plan Total Fractions Prescribed: 22
Plan Total Prescribed Dose: 39.6 Gy
Reference Point Dosage Given to Date: 18 Gy
Reference Point Session Dosage Given: 1.8 Gy
Session Number: 10

## 2024-11-07 ENCOUNTER — Other Ambulatory Visit: Payer: Self-pay

## 2024-11-07 ENCOUNTER — Ambulatory Visit
Admission: RE | Admit: 2024-11-07 | Discharge: 2024-11-07 | Disposition: A | Source: Ambulatory Visit | Attending: Radiation Oncology | Admitting: Radiation Oncology

## 2024-11-07 LAB — RAD ONC ARIA SESSION SUMMARY
Course Elapsed Days: 14
Plan Fractions Treated to Date: 11
Plan Prescribed Dose Per Fraction: 1.8 Gy
Plan Total Fractions Prescribed: 22
Plan Total Prescribed Dose: 39.6 Gy
Reference Point Dosage Given to Date: 19.8 Gy
Reference Point Session Dosage Given: 1.8 Gy
Session Number: 11

## 2024-11-07 MED ORDER — GABAPENTIN 100 MG PO CAPS: 100.0000 mg | ORAL_CAPSULE | Freq: Every day | ORAL | 1 refills | Status: AC

## 2024-11-08 ENCOUNTER — Inpatient Hospital Stay

## 2024-11-08 ENCOUNTER — Ambulatory Visit
Admission: RE | Admit: 2024-11-08 | Discharge: 2024-11-08 | Disposition: A | Discharge status: Home / Self Care | Source: Ambulatory Visit | Attending: Radiation Oncology | Admitting: Radiation Oncology

## 2024-11-08 ENCOUNTER — Other Ambulatory Visit: Payer: Self-pay

## 2024-11-08 DIAGNOSIS — N5089 Other specified disorders of the male genital organs: Secondary | ICD-10-CM

## 2024-11-08 LAB — CBC (CANCER CENTER ONLY)
HCT: 34.9 % — ABNORMAL LOW (ref 39.0–52.0)
Hemoglobin: 11.9 g/dL — ABNORMAL LOW (ref 13.0–17.0)
MCH: 31.6 pg (ref 26.0–34.0)
MCHC: 34.1 g/dL (ref 30.0–36.0)
MCV: 92.6 fL (ref 80.0–100.0)
Platelet Count: 193 K/uL (ref 150–400)
RBC: 3.77 MIL/uL — ABNORMAL LOW (ref 4.22–5.81)
RDW: 13.3 % (ref 11.5–15.5)
WBC Count: 6.5 K/uL (ref 4.0–10.5)
nRBC: 0 % (ref 0.0–0.2)

## 2024-11-08 LAB — RAD ONC ARIA SESSION SUMMARY
Course Elapsed Days: 15
Plan Fractions Treated to Date: 12
Plan Prescribed Dose Per Fraction: 1.8 Gy
Plan Total Fractions Prescribed: 22
Plan Total Prescribed Dose: 39.6 Gy
Reference Point Dosage Given to Date: 21.6 Gy
Reference Point Session Dosage Given: 1.8 Gy
Session Number: 12

## 2024-11-09 ENCOUNTER — Ambulatory Visit
Admission: RE | Admit: 2024-11-09 | Discharge: 2024-11-09 | Disposition: A | Discharge status: Home / Self Care | Source: Ambulatory Visit | Attending: Radiation Oncology | Admitting: Radiation Oncology

## 2024-11-09 ENCOUNTER — Other Ambulatory Visit: Payer: Self-pay

## 2024-11-09 DIAGNOSIS — N529 Male erectile dysfunction, unspecified: Secondary | ICD-10-CM

## 2024-11-09 LAB — RAD ONC ARIA SESSION SUMMARY
Course Elapsed Days: 16
Plan Fractions Treated to Date: 13
Plan Prescribed Dose Per Fraction: 1.8 Gy
Plan Total Fractions Prescribed: 22
Plan Total Prescribed Dose: 39.6 Gy
Reference Point Dosage Given to Date: 23.4 Gy
Reference Point Session Dosage Given: 1.8 Gy
Session Number: 13

## 2024-11-10 ENCOUNTER — Ambulatory Visit
Admission: RE | Admit: 2024-11-10 | Discharge: 2024-11-10 | Disposition: A | Discharge status: Home / Self Care | Source: Ambulatory Visit | Attending: Radiation Oncology | Admitting: Radiation Oncology

## 2024-11-10 ENCOUNTER — Other Ambulatory Visit: Payer: Self-pay

## 2024-11-10 ENCOUNTER — Other Ambulatory Visit

## 2024-11-10 DIAGNOSIS — N529 Male erectile dysfunction, unspecified: Secondary | ICD-10-CM

## 2024-11-10 LAB — RAD ONC ARIA SESSION SUMMARY
Course Elapsed Days: 17
Plan Fractions Treated to Date: 14
Plan Prescribed Dose Per Fraction: 1.8 Gy
Plan Total Fractions Prescribed: 22
Plan Total Prescribed Dose: 39.6 Gy
Reference Point Dosage Given to Date: 25.2 Gy
Reference Point Session Dosage Given: 1.8 Gy
Session Number: 14

## 2024-11-11 LAB — TESTOSTERONE: Testosterone: 55 ng/dL — ABNORMAL LOW (ref 264–916)

## 2024-11-13 ENCOUNTER — Ambulatory Visit

## 2024-11-14 ENCOUNTER — Ambulatory Visit
Admission: RE | Admit: 2024-11-14 | Discharge: 2024-11-14 | Disposition: A | Source: Ambulatory Visit | Attending: Radiation Oncology | Admitting: Radiation Oncology

## 2024-11-14 ENCOUNTER — Other Ambulatory Visit: Payer: Self-pay

## 2024-11-14 LAB — RAD ONC ARIA SESSION SUMMARY
Course Elapsed Days: 21
Plan Fractions Treated to Date: 15
Plan Prescribed Dose Per Fraction: 1.8 Gy
Plan Total Fractions Prescribed: 22
Plan Total Prescribed Dose: 39.6 Gy
Reference Point Dosage Given to Date: 27 Gy
Reference Point Session Dosage Given: 1.8 Gy
Session Number: 15

## 2024-11-15 ENCOUNTER — Ambulatory Visit
Admission: RE | Admit: 2024-11-15 | Discharge: 2024-11-15 | Disposition: A | Discharge status: Home / Self Care | Source: Ambulatory Visit | Attending: Radiation Oncology | Admitting: Radiation Oncology

## 2024-11-15 ENCOUNTER — Inpatient Hospital Stay

## 2024-11-15 ENCOUNTER — Other Ambulatory Visit: Payer: Self-pay

## 2024-11-15 DIAGNOSIS — N5089 Other specified disorders of the male genital organs: Secondary | ICD-10-CM

## 2024-11-15 LAB — CBC (CANCER CENTER ONLY)
HCT: 35.5 % — ABNORMAL LOW (ref 39.0–52.0)
Hemoglobin: 12 g/dL — ABNORMAL LOW (ref 13.0–17.0)
MCH: 30.8 pg (ref 26.0–34.0)
MCHC: 33.8 g/dL (ref 30.0–36.0)
MCV: 91.3 fL (ref 80.0–100.0)
Platelet Count: 176 K/uL (ref 150–400)
RBC: 3.89 MIL/uL — ABNORMAL LOW (ref 4.22–5.81)
RDW: 13.1 % (ref 11.5–15.5)
WBC Count: 7.6 K/uL (ref 4.0–10.5)
nRBC: 0 % (ref 0.0–0.2)

## 2024-11-15 LAB — RAD ONC ARIA SESSION SUMMARY
Course Elapsed Days: 22
Plan Fractions Treated to Date: 16
Plan Prescribed Dose Per Fraction: 1.8 Gy
Plan Total Fractions Prescribed: 22
Plan Total Prescribed Dose: 39.6 Gy
Reference Point Dosage Given to Date: 28.8 Gy
Reference Point Session Dosage Given: 1.8 Gy
Session Number: 16

## 2024-11-20 ENCOUNTER — Ambulatory Visit
Admission: RE | Admit: 2024-11-20 | Discharge: 2024-11-20 | Disposition: A | Source: Ambulatory Visit | Attending: Radiation Oncology | Admitting: Radiation Oncology

## 2024-11-20 ENCOUNTER — Other Ambulatory Visit: Payer: Self-pay

## 2024-11-20 DIAGNOSIS — C83398 Diffuse large b-cell lymphoma of other extranodal and solid organ sites: Secondary | ICD-10-CM | POA: Diagnosis present

## 2024-11-20 LAB — RAD ONC ARIA SESSION SUMMARY
Course Elapsed Days: 27
Plan Fractions Treated to Date: 17
Plan Prescribed Dose Per Fraction: 1.8 Gy
Plan Total Fractions Prescribed: 22
Plan Total Prescribed Dose: 39.6 Gy
Reference Point Dosage Given to Date: 30.6 Gy
Reference Point Session Dosage Given: 1.8 Gy
Session Number: 17

## 2024-11-21 ENCOUNTER — Ambulatory Visit
Admission: RE | Admit: 2024-11-21 | Discharge: 2024-11-21 | Disposition: A | Source: Ambulatory Visit | Attending: Radiation Oncology | Admitting: Radiation Oncology

## 2024-11-21 ENCOUNTER — Other Ambulatory Visit: Payer: Self-pay

## 2024-11-21 DIAGNOSIS — C83398 Diffuse large b-cell lymphoma of other extranodal and solid organ sites: Secondary | ICD-10-CM | POA: Diagnosis not present

## 2024-11-21 LAB — RAD ONC ARIA SESSION SUMMARY
Course Elapsed Days: 28
Plan Fractions Treated to Date: 18
Plan Prescribed Dose Per Fraction: 1.8 Gy
Plan Total Fractions Prescribed: 22
Plan Total Prescribed Dose: 39.6 Gy
Reference Point Dosage Given to Date: 32.4 Gy
Reference Point Session Dosage Given: 1.8 Gy
Session Number: 18

## 2024-11-22 ENCOUNTER — Other Ambulatory Visit: Payer: Self-pay

## 2024-11-22 ENCOUNTER — Ambulatory Visit
Admission: RE | Admit: 2024-11-22 | Discharge: 2024-11-22 | Disposition: A | Source: Ambulatory Visit | Attending: Radiation Oncology | Admitting: Radiation Oncology

## 2024-11-22 ENCOUNTER — Inpatient Hospital Stay: Attending: Oncology

## 2024-11-22 ENCOUNTER — Ambulatory Visit: Admitting: Urology

## 2024-11-22 DIAGNOSIS — C83398 Diffuse large b-cell lymphoma of other extranodal and solid organ sites: Secondary | ICD-10-CM | POA: Insufficient documentation

## 2024-11-22 DIAGNOSIS — N5089 Other specified disorders of the male genital organs: Secondary | ICD-10-CM

## 2024-11-22 DIAGNOSIS — D6481 Anemia due to antineoplastic chemotherapy: Secondary | ICD-10-CM | POA: Insufficient documentation

## 2024-11-22 LAB — RAD ONC ARIA SESSION SUMMARY
Course Elapsed Days: 29
Plan Fractions Treated to Date: 19
Plan Prescribed Dose Per Fraction: 1.8 Gy
Plan Total Fractions Prescribed: 22
Plan Total Prescribed Dose: 39.6 Gy
Reference Point Dosage Given to Date: 34.2 Gy
Reference Point Session Dosage Given: 1.8 Gy
Session Number: 19

## 2024-11-22 LAB — CBC (CANCER CENTER ONLY)
HCT: 35 % — ABNORMAL LOW (ref 39.0–52.0)
Hemoglobin: 11.9 g/dL — ABNORMAL LOW (ref 13.0–17.0)
MCH: 31 pg (ref 26.0–34.0)
MCHC: 34 g/dL (ref 30.0–36.0)
MCV: 91.1 fL (ref 80.0–100.0)
Platelet Count: 175 K/uL (ref 150–400)
RBC: 3.84 MIL/uL — ABNORMAL LOW (ref 4.22–5.81)
RDW: 12.7 % (ref 11.5–15.5)
WBC Count: 7.1 K/uL (ref 4.0–10.5)
nRBC: 0 % (ref 0.0–0.2)

## 2024-11-23 ENCOUNTER — Other Ambulatory Visit: Payer: Self-pay

## 2024-11-23 ENCOUNTER — Ambulatory Visit
Admission: RE | Admit: 2024-11-23 | Discharge: 2024-11-23 | Disposition: A | Source: Ambulatory Visit | Attending: Radiation Oncology | Admitting: Radiation Oncology

## 2024-11-23 DIAGNOSIS — C83398 Diffuse large b-cell lymphoma of other extranodal and solid organ sites: Secondary | ICD-10-CM | POA: Diagnosis not present

## 2024-11-23 LAB — RAD ONC ARIA SESSION SUMMARY
Course Elapsed Days: 30
Plan Fractions Treated to Date: 20
Plan Prescribed Dose Per Fraction: 1.8 Gy
Plan Total Fractions Prescribed: 22
Plan Total Prescribed Dose: 39.6 Gy
Reference Point Dosage Given to Date: 36 Gy
Reference Point Session Dosage Given: 1.8 Gy
Session Number: 20

## 2024-11-24 ENCOUNTER — Ambulatory Visit
Admission: RE | Admit: 2024-11-24 | Discharge: 2024-11-24 | Disposition: A | Source: Ambulatory Visit | Attending: Radiation Oncology | Admitting: Radiation Oncology

## 2024-11-24 ENCOUNTER — Other Ambulatory Visit: Payer: Self-pay

## 2024-11-24 ENCOUNTER — Ambulatory Visit

## 2024-11-24 DIAGNOSIS — C83398 Diffuse large b-cell lymphoma of other extranodal and solid organ sites: Secondary | ICD-10-CM | POA: Diagnosis not present

## 2024-11-24 LAB — RAD ONC ARIA SESSION SUMMARY
Course Elapsed Days: 31
Plan Fractions Treated to Date: 21
Plan Prescribed Dose Per Fraction: 1.8 Gy
Plan Total Fractions Prescribed: 22
Plan Total Prescribed Dose: 39.6 Gy
Reference Point Dosage Given to Date: 37.8 Gy
Reference Point Session Dosage Given: 1.8 Gy
Session Number: 21

## 2024-11-27 ENCOUNTER — Ambulatory Visit
Admission: RE | Admit: 2024-11-27 | Discharge: 2024-11-27 | Disposition: A | Source: Ambulatory Visit | Attending: Radiation Oncology | Admitting: Radiation Oncology

## 2024-11-27 ENCOUNTER — Other Ambulatory Visit: Payer: Self-pay

## 2024-11-27 DIAGNOSIS — C83398 Diffuse large b-cell lymphoma of other extranodal and solid organ sites: Secondary | ICD-10-CM | POA: Diagnosis not present

## 2024-11-27 LAB — RAD ONC ARIA SESSION SUMMARY
Course Elapsed Days: 34
Plan Fractions Treated to Date: 22
Plan Prescribed Dose Per Fraction: 1.8 Gy
Plan Total Fractions Prescribed: 22
Plan Total Prescribed Dose: 39.6 Gy
Reference Point Dosage Given to Date: 39.6 Gy
Reference Point Session Dosage Given: 1.8 Gy
Session Number: 22

## 2024-11-28 ENCOUNTER — Ambulatory Visit: Admitting: Urology

## 2024-11-28 NOTE — Radiation Completion Notes (Signed)
 Patient Name: [name], FOUCHE MRN: [id number] Date of Birth: Jan 17, 1941 Referring Physician: LAVENIA BEAVER, M.D. Date of Service: 2024-11-28 Radiation Oncologist: Marcey Penton, M.D. 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  He eventually was seen here by urology where ultrasound showed diffuse heterogeneous right testicular echotexture consistent with a dominant mass.  This favored lymphoproliferative process such as lymphoma.  This was confirmed on CT scan again showing an heterogeneous enhancement in both testicles with substantial abnormal large in the right testicle suspicious for testicular neoplasm.  PET scan confirmed a lobular appearance of the scrotum with soft tissue is seen on the prior CT scans with hypermetabolic activity consistent with neoplasm.  Had bilateral small hilar lymph nodes only mildly hypermetabolic.  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  Patient has undergone 6 cycles of mini CHOP chemotherapy.  Also received intrathecal methotrexate .  His most recent PET CT scan back in August showed interval right orchiectomy with small amount of residual [place]vall for activity in the right scrotal sac nonspecific he did have some small hypermetabolic bilateral hilar nodes.  He has completed his chemotherapy.  He is seen today for consideration of involved field radiation to his scrotum.  He specifically denies any lower urinary tract symptoms.  He does tends towards constipation.      ==========DELIVERED PLANS==========  First Treatment Date: 2024-10-24 Last Treatment Date: 2024-11-27   Plan Name: Pelvis_Scrot Site: Scrotum Technique: IMRT Mode:  Photon Dose Per Fraction: 1.8 Gy Prescribed Dose (Delivered / Prescribed): 39.6 Gy / 39.6 Gy Prescribed Fxs (Delivered / Prescribed): 22 / 22     ==========ON TREATMENT VISIT DATES========== 2024-10-25, 2024-10-31, 2024-11-07, 2024-11-14, 2024-11-21     ==========UPCOMING VISITS========== 12/27/2024 [facility] RAD ONCOLOGY FOLLOW UP 30 Penton Marcey, MD  12/12/2024 [facility] OFFICE VISIT Francisca Redell BROCKS, MD  12/11/2024 [facility] MED ONC PORT FLUSH [facility]-PORT FLUSH        ==========APPENDIX - ON TREATMENT VISIT NOTES==========   See weekly On Treatment Notes in Epic for details in the Media tab (listed as Progress notes on the On Treatment Visit Dates listed above).

## 2024-11-29 ENCOUNTER — Ambulatory Visit: Admitting: Urology

## 2024-12-08 ENCOUNTER — Inpatient Hospital Stay

## 2024-12-11 ENCOUNTER — Inpatient Hospital Stay

## 2024-12-11 DIAGNOSIS — C83398 Diffuse large b-cell lymphoma of other extranodal and solid organ sites: Secondary | ICD-10-CM | POA: Diagnosis not present

## 2024-12-11 NOTE — Patient Instructions (Signed)

## 2024-12-12 ENCOUNTER — Ambulatory Visit: Admitting: Urology

## 2024-12-12 VITALS — BP 170/56 | HR 62 | Wt 135.0 lb

## 2024-12-12 DIAGNOSIS — N529 Male erectile dysfunction, unspecified: Secondary | ICD-10-CM

## 2024-12-12 DIAGNOSIS — C8580 Other specified types of non-Hodgkin lymphoma, unspecified site: Secondary | ICD-10-CM

## 2024-12-12 NOTE — Patient Instructions (Signed)
 You can take the sildenafil (Viagra ) 50 to 100 mg as needed 1 hour prior to sexual activity on an empty stomach

## 2024-12-12 NOTE — Progress Notes (Signed)
" ° °  12/12/2024 12:09 PM   [name] 1941/04/22 [id number]  Reason for visit: Follow up lymphoma right testicle, ED, hypogonadism  History: Presented in April 2025 with right testicular swelling and scrotal ultrasound showed a large 7 cm heterogenous mass concerning for malignancy and multiple left-sided abnormalities Underwent challenging right radical orchiectomy 05/12/2024 with pathology showing B-cell lymphoma, tumor adhered/extended into scrotum Staging confirmed stage III B-cell lymphoma, and treated with chemotherapy and radiation  Physical Exam: BP (!) 170/56   Pulse 62   Wt 135 lb (61.2 kg)   SpO2 99%   BMI 21.79 kg/m  GU: Right testicle surgically absent, no firmness or abnormality within the right scrotum, no tenderness.  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 No improvement in ED on Cialis , was previously prescribed Viagra  but has not tried this medication, has not been sexually active during lymphoma treatment He requested a testosterone  level at his last visit which was low at 55, he continues to have good energy and remains extremely active, would like to hold off on any additional treatments at this time and repeat levels when he returns from [place]   Plan:   Lymphoma: Status post right radical orchiectomy with B-cell lymphoma, treated with chemotherapy and radiation, continue follow-up with oncology for surveillance imaging Scrotal pain: Resolved after orchiectomy ED: Will trial sildenafil  50 to 100 mg on demand, risk and benefits discussed, will repeat morning testosterone  and follow-up per patient request, he defers further treatments at this time RTC [place] 2026 with morning testosterone  prior   Redell JAYSON Burnet, MD  [facility] [address], [address] [place], [place] [address] [phone]  "

## 2024-12-13 ENCOUNTER — Other Ambulatory Visit: Payer: Self-pay

## 2024-12-19 ENCOUNTER — Telehealth: Payer: Self-pay | Admitting: Radiation Oncology

## 2024-12-19 NOTE — Telephone Encounter (Signed)
 Patients wife called to cancel appt scheduled on 12/27/2024. They are currently in [place] . She is requesting a call back to reschedule.

## 2024-12-27 ENCOUNTER — Ambulatory Visit: Admitting: Radiation Oncology

## 2025-01-05 ENCOUNTER — Other Ambulatory Visit: Payer: Self-pay | Admitting: Oncology

## 2025-01-08 ENCOUNTER — Encounter: Payer: Self-pay | Admitting: Oncology

## 2025-01-16 ENCOUNTER — Telehealth: Payer: Self-pay | Admitting: Oncology

## 2025-01-16 NOTE — Telephone Encounter (Signed)
 Pt spouse called to schedule appts at the end of April, when they get back from [facility]. Appts scheduled and confirmed

## 2025-04-16 ENCOUNTER — Ambulatory Visit: Admitting: Cardiology

## 2025-04-19 ENCOUNTER — Inpatient Hospital Stay: Admitting: Oncology

## 2025-04-19 ENCOUNTER — Inpatient Hospital Stay

## 2025-06-05 ENCOUNTER — Other Ambulatory Visit

## 2025-06-12 ENCOUNTER — Ambulatory Visit: Admitting: Urology
